# Patient Record
Sex: Male | Born: 1960 | Race: Black or African American | Hispanic: No | Smoking: Former smoker
Health system: Southern US, Community
[De-identification: ages and names within clinical notes are randomized; demographics above are authoritative.]

## PROBLEM LIST (undated history)

## (undated) DIAGNOSIS — C801 Malignant (primary) neoplasm, unspecified: Secondary | ICD-10-CM

## (undated) DIAGNOSIS — I10 Essential (primary) hypertension: Secondary | ICD-10-CM

## (undated) DIAGNOSIS — C20 Malignant neoplasm of rectum: Secondary | ICD-10-CM

## (undated) DIAGNOSIS — G629 Polyneuropathy, unspecified: Secondary | ICD-10-CM

## (undated) DIAGNOSIS — M1990 Unspecified osteoarthritis, unspecified site: Secondary | ICD-10-CM

## (undated) DIAGNOSIS — F32A Depression, unspecified: Secondary | ICD-10-CM

## (undated) DIAGNOSIS — K219 Gastro-esophageal reflux disease without esophagitis: Secondary | ICD-10-CM

## (undated) DIAGNOSIS — G4730 Sleep apnea, unspecified: Secondary | ICD-10-CM

## (undated) DIAGNOSIS — E7800 Pure hypercholesterolemia, unspecified: Secondary | ICD-10-CM

## (undated) DIAGNOSIS — F329 Major depressive disorder, single episode, unspecified: Secondary | ICD-10-CM

## (undated) HISTORY — DX: Malignant neoplasm of rectum: C20

## (undated) HISTORY — PX: CHOLECYSTECTOMY: SHX55

## (undated) HISTORY — PX: BACK SURGERY: SHX140

## (undated) HISTORY — DX: Malignant (primary) neoplasm, unspecified: C80.1

---

## 2005-02-06 ENCOUNTER — Ambulatory Visit (HOSPITAL_COMMUNITY): Admission: RE | Admit: 2005-02-06 | Discharge: 2005-02-06 | Payer: Self-pay | Admitting: Family Medicine

## 2005-02-14 ENCOUNTER — Ambulatory Visit: Payer: Self-pay | Admitting: Orthopedic Surgery

## 2005-03-07 ENCOUNTER — Ambulatory Visit: Payer: Self-pay | Admitting: Orthopedic Surgery

## 2005-03-12 ENCOUNTER — Ambulatory Visit (HOSPITAL_COMMUNITY): Admission: RE | Admit: 2005-03-12 | Discharge: 2005-03-12 | Payer: Self-pay | Admitting: Orthopedic Surgery

## 2005-03-19 ENCOUNTER — Ambulatory Visit: Payer: Self-pay | Admitting: Orthopedic Surgery

## 2005-04-03 ENCOUNTER — Encounter: Admission: RE | Admit: 2005-04-03 | Discharge: 2005-04-03 | Payer: Self-pay | Admitting: Orthopedic Surgery

## 2005-04-18 ENCOUNTER — Encounter: Admission: RE | Admit: 2005-04-18 | Discharge: 2005-04-18 | Payer: Self-pay | Admitting: Internal Medicine

## 2005-05-02 ENCOUNTER — Encounter: Admission: RE | Admit: 2005-05-02 | Discharge: 2005-05-02 | Payer: Self-pay | Admitting: Orthopedic Surgery

## 2005-05-10 ENCOUNTER — Ambulatory Visit: Payer: Self-pay | Admitting: Orthopedic Surgery

## 2005-08-17 ENCOUNTER — Ambulatory Visit (HOSPITAL_COMMUNITY): Admission: RE | Admit: 2005-08-17 | Discharge: 2005-08-17 | Payer: Self-pay | Admitting: Neurosurgery

## 2006-06-28 ENCOUNTER — Inpatient Hospital Stay (HOSPITAL_COMMUNITY): Admission: EM | Admit: 2006-06-28 | Discharge: 2006-07-01 | Payer: Self-pay | Admitting: Emergency Medicine

## 2006-06-29 ENCOUNTER — Ambulatory Visit: Payer: Self-pay | Admitting: Internal Medicine

## 2006-07-31 ENCOUNTER — Ambulatory Visit: Payer: Self-pay | Admitting: Gastroenterology

## 2007-01-21 ENCOUNTER — Encounter: Admission: RE | Admit: 2007-01-21 | Discharge: 2007-04-21 | Payer: Self-pay | Admitting: Neurosurgery

## 2007-02-13 ENCOUNTER — Ambulatory Visit (HOSPITAL_COMMUNITY): Admission: RE | Admit: 2007-02-13 | Discharge: 2007-02-13 | Payer: Self-pay | Admitting: Neurosurgery

## 2007-02-27 ENCOUNTER — Ambulatory Visit (HOSPITAL_COMMUNITY): Admission: RE | Admit: 2007-02-27 | Discharge: 2007-02-27 | Payer: Self-pay | Admitting: Family Medicine

## 2007-03-05 ENCOUNTER — Encounter (INDEPENDENT_AMBULATORY_CARE_PROVIDER_SITE_OTHER): Payer: Self-pay | Admitting: *Deleted

## 2007-03-05 ENCOUNTER — Ambulatory Visit (HOSPITAL_COMMUNITY): Admission: RE | Admit: 2007-03-05 | Discharge: 2007-03-05 | Payer: Self-pay | Admitting: General Surgery

## 2007-04-11 ENCOUNTER — Ambulatory Visit (HOSPITAL_COMMUNITY): Admission: RE | Admit: 2007-04-11 | Discharge: 2007-04-11 | Payer: Self-pay | Admitting: Neurosurgery

## 2007-05-01 ENCOUNTER — Ambulatory Visit: Payer: Self-pay | Admitting: Vascular Surgery

## 2007-05-01 ENCOUNTER — Ambulatory Visit (HOSPITAL_COMMUNITY): Admission: RE | Admit: 2007-05-01 | Discharge: 2007-05-01 | Payer: Self-pay | Admitting: Neurosurgery

## 2007-06-25 ENCOUNTER — Ambulatory Visit (HOSPITAL_COMMUNITY): Admission: RE | Admit: 2007-06-25 | Discharge: 2007-06-25 | Payer: Self-pay | Admitting: Neurosurgery

## 2007-10-07 ENCOUNTER — Encounter: Payer: Self-pay | Admitting: Orthopedic Surgery

## 2007-10-17 ENCOUNTER — Encounter: Admission: RE | Admit: 2007-10-17 | Discharge: 2007-10-17 | Payer: Self-pay | Admitting: Neurosurgery

## 2007-11-03 ENCOUNTER — Encounter: Payer: Self-pay | Admitting: Orthopedic Surgery

## 2008-01-01 ENCOUNTER — Encounter (HOSPITAL_COMMUNITY): Admission: RE | Admit: 2008-01-01 | Discharge: 2008-01-31 | Payer: Self-pay | Admitting: Family Medicine

## 2008-01-13 ENCOUNTER — Encounter: Payer: Self-pay | Admitting: Orthopedic Surgery

## 2008-01-23 ENCOUNTER — Emergency Department (HOSPITAL_COMMUNITY): Admission: EM | Admit: 2008-01-23 | Discharge: 2008-01-23 | Payer: Self-pay | Admitting: Emergency Medicine

## 2008-02-09 ENCOUNTER — Encounter: Payer: Self-pay | Admitting: Orthopedic Surgery

## 2008-03-01 ENCOUNTER — Ambulatory Visit (HOSPITAL_COMMUNITY): Admission: RE | Admit: 2008-03-01 | Discharge: 2008-03-01 | Payer: Self-pay | Admitting: Neurosurgery

## 2008-03-16 ENCOUNTER — Encounter: Payer: Self-pay | Admitting: Orthopedic Surgery

## 2008-05-19 ENCOUNTER — Encounter: Payer: Self-pay | Admitting: Orthopedic Surgery

## 2008-06-28 ENCOUNTER — Encounter: Payer: Self-pay | Admitting: Orthopedic Surgery

## 2008-09-28 ENCOUNTER — Encounter: Payer: Self-pay | Admitting: Orthopedic Surgery

## 2008-11-03 ENCOUNTER — Encounter: Admission: RE | Admit: 2008-11-03 | Discharge: 2008-11-03 | Payer: Self-pay | Admitting: Neurosurgery

## 2008-12-21 ENCOUNTER — Ambulatory Visit (HOSPITAL_COMMUNITY): Admission: RE | Admit: 2008-12-21 | Discharge: 2008-12-21 | Payer: Self-pay | Admitting: Neurology

## 2009-01-20 ENCOUNTER — Encounter: Admission: RE | Admit: 2009-01-20 | Discharge: 2009-01-27 | Payer: Self-pay | Admitting: Neurosurgery

## 2009-02-01 ENCOUNTER — Encounter: Payer: Self-pay | Admitting: Orthopedic Surgery

## 2009-02-07 ENCOUNTER — Ambulatory Visit: Admission: RE | Admit: 2009-02-07 | Discharge: 2009-02-07 | Payer: Self-pay | Admitting: Neurology

## 2009-09-09 ENCOUNTER — Encounter: Payer: Self-pay | Admitting: Orthopedic Surgery

## 2010-12-10 ENCOUNTER — Encounter: Payer: Self-pay | Admitting: Neurosurgery

## 2010-12-10 ENCOUNTER — Encounter: Payer: Self-pay | Admitting: Internal Medicine

## 2010-12-10 ENCOUNTER — Encounter: Payer: Self-pay | Admitting: Family Medicine

## 2011-04-03 NOTE — Op Note (Signed)
NAMEBRODEE, [name] NO.:  [id number]   MEDICAL RECORD NO.:  [id number]          PATIENT TYPE:  INP   LOCATION:  5157                         FACILITY:  [facility]   PHYSICIAN:  Coletta Memos, M.D.     DATE OF BIRTH:  July 19, 1961   DATE OF PROCEDURE:  04/11/2007  DATE OF DISCHARGE:                               OPERATIVE REPORT   PREOPERATIVE DIAGNOSIS:  1. Far lateral displaced disc left L4-L5.  2. Left L4 radiculopathy.   POSTOPERATIVE DIAGNOSIS:  1. Far lateral displaced disc left L4-L5.  2. Left L4 radiculopathy.   PROCEDURE:  Far lateral decompression and discectomy L4-L5 with  microscopic dissection.   SURGEON:  Coletta Memos, M.D.   ASSISTANT:  Danae Orleans. Venetia Maxon, M.D.   INDICATIONS:  [name] has had a far lateral disc herniation at L4-L5  on the left side for at least two years.  He, at this time, requested  that he undergo surgery since the pain was too severe and conservative  measures have not helped him recently.   DESCRIPTION OF PROCEDURE:  Mr. Lacroix was taken to the operating room,  intubated, and placed under a general anesthetic.  His back was prepped  and he was draped in a sterile fashion.  He was positioned on a Wilson  frame and all pressure points were properly padded.  I infiltrated 20 mL  0.5% lidocaine with 1:200,000 epinephrine into the lumbar region.  I  opened the skin with a #10 blade.  I took this down to the thoracolumbar  fascia.  I then exposed the lamina of L4 and L3.  I placed a double  ended ganglion knife inferior to the lamina of what I believed to be L4.  X-ray showed that was the L4 lamina.  I then proceeded to identify the  pars intra-articularis.  I used a high speed drill to remove the lateral  portion of the pars.  I got underneath that with a Kerrison punch and  removed soft tissue with Dr. Fredrich Birks assistance and we were able to  expose what should be the L-4 root.  There was a very hard mass inferior  to the nerve  root which I was able to dissect free from the root itself.  At that time, I then used a 15 blade and opened this mass and it was the  disc herniation.  With Dr. Fredrich Birks assistance and microscopic  dissection, we removed the mass in piecemeal fashion.  This achieved a  very good decompression of the L4 root.  I then irrigated the wound.  I  then placed steroids and fentanyl around the wound.  I then closed the  wound in a layered fashion using Vicryl sutures.  Dermabond was used for  a sterile dressing.  The patient tolerated procedure well.           ______________________________  Coletta Memos, M.D.     KC/MEDQ  D:  04/11/2007  T:  04/11/2007  Job:  [id number]

## 2011-04-06 NOTE — Consult Note (Signed)
NAMEEDU, ON                 ACCOUNT NO.:  [id number]   MEDICAL RECORD NO.:  [id number]          PATIENT TYPE:  AMB   LOCATION:                                FACILITY:  [facility]   PHYSICIAN:  R. Roetta Sessions, M.D. DATE OF BIRTH:  [birth year]   DATE OF CONSULTATION:  DATE OF DISCHARGE:                                   CONSULTATION   REASON FOR CONSULTATION:  Colonoscopy, followup hospitalization.   HISTORY OF PRESENT ILLNESS:  [name] is a 50 year old African American  male who was admitted with acute colitis approximately 1 month ago at [facility].  He was treated with antibiotic therapy which included Cipro  and Flagyl.  He has completed this course.  He told me he is doing much  better.  He denies any problems with diarrhea, rectal bleeding, abdominal  pain, fever, nausea, vomiting.  His bowel movements are normal, soft, and  brown once or twice a day.  He denies any diarrhea or constipation.  He is  taking ibuprofen 400 mg daily.   While hospitalized, his CT scan was abnormal.  It showed diffuse colitis  from the cecum through the sigmoid and an abnormal appearance of the  gallbladder which showed distended fundus, echogenic sludge, possibly  layered stones, and some wall thickening.  It was suggested that since he  had acute colitis at the time the abdominal ultrasound be repeated and I  have set this up today.   PAST MEDICAL HISTORY:  1. Acute colitis as described in the HPI, August 2007, that responded to      antibiotic therapy.  2. Hypertension.  3. Chronic back pain.  4. Chronic GERD.  5. Proteinuria, followed by Dr. Kristian Covey.   CURRENT MEDICATIONS:  1. Over-the-counter congestion pill for URI.  2. Prilosec 20 mg daily.  3. Ibuprofen 400 mg daily or b.i.d. p.r.n.  4. Lotrel 5/500 mg daily.  5. Amitriptyline 275 mg q.h.s.   ALLERGIES:  NO KNOWN DRUG ALLERGIES.   FAMILY HISTORY:  Mother deceased secondary to metastatic breast carcinoma.  It is  possible that brother had a history of colon cancer, diagnosed in his  24s.  Otherwise history is positive for CVA and MI.   SOCIAL HISTORY:  Mr. Trier is married.  He has 3 children.  He is on  disability.  He has a remote history of tobacco use.  Denies any alcohol or  drug use.   REVIEW OF SYSTEMS:  CONSTITUTIONAL:  Weight is stable.  Denies any fatigue  or rash.  Denies any chest pain or palpitations.  Denies shortness of  breath, dyspnea, cough, hemoptysis.  GI:  See HPI.   PHYSICAL EXAMINATION:  VITAL SIGNS:  Weight 201 pounds, height 63 inches,  temp 97.7, blood pressure 128/88, and pulse 88.  GENERAL:  [name] is a 50 year old, obese, African American male who is  alert, oriented, pleasant, cooperative in no acute distress.  HEENT:  __________  .  Conjunctivae pink.  Oropharynx pink and moist without  any lesions.  NECK:  Supple without any mass  or thyromegaly.  CHEST:  Heart regular rate and rhythm with normal S1 S2 without any murmurs,  clicks, rubs or gallops.  LUNGS:  Clear to auscultation bilaterally.  ABDOMEN:  Protuberant with positive bowel sounds x4.  No bruits auscultated.  Abdomen is soft, nontender, nondistended.  He does have an easily reducible  small umbilical hernia.  No rebound tenderness or guarding.  No  hepatosplenomegaly or mass.  Exam is limited given the patient's body  habitus.  EXTREMITIES:  Without clubbing or edema bilaterally.   IMPRESSION:  1. [name] is a 50 year old African American male with acute colitis      which has responded to antibiotic therapy, possibly confirming that      this was infectious in etiology.  However, he has never had colonoscopy      and given disease findings on CT, we need to rule out inflammatory      bowel disease.  2. He was also incidentally found to have a distended gallbladder,      possibly some calculi and sludge and a distended fundus.  We would like      to reassess the gallbladder now that his acute  colitis has resolved.   PLAN:  1. We will schedule abdominal ultrasound.  2. Colonoscopy with Dr. Jena Gauss in the near future.  I have discussed the      procedure including risks and benefits which include, but are not      limited to, bleeding, infection, perforation, drug reaction, increased      __________  .  3. Further recommendations to follow.      Nicholas Lose, N.P.      Jonathon Bellows, M.D.  Electronically Signed    KC/MEDQ  D:  07/31/2006  T:  07/31/2006  Job:  [id number]   cc:   Jeoffrey Massed, MD  Fax: [phone]

## 2011-04-06 NOTE — Group Therapy Note (Signed)
NAMEBARAN, [name]                 ACCOUNT NO.:  [id number]   MEDICAL RECORD NO.:  [id number]          PATIENT TYPE:  INP   LOCATION:  A321                          FACILITY:  [facility]   PHYSICIAN:  Margaretmary Dys, M.D.DATE OF BIRTH:  08/29/1961   DATE OF PROCEDURE:  06/30/2006  DATE OF DISCHARGE:                                   PROGRESS NOTE   SUBJECTIVE:  Patient doing fairly well.  He is able to tolerate clear  liquids and his diet has been advanced by GI.  He denies any diarrhea.  He  has no nausea and vomiting.  His abdominal cramping is also significantly  improved.   OBJECTIVE:  Conscious, alert, comfortable, not in acute distress, well  oriented to time, place and person.  VITAL SIGNS:  Blood pressure 122/86, pulse of 97, respirations 20, T-max was  97.7.  Oxygen saturation 95% on room air.  HEENT EXAM:  Normocephalic and atraumatic.  Oral mucosa was moist with no  exudate.  NECK:  Supple, no JVD, no lymphadenopathy.  LUNGS:  Clear clinically with good air entry bilaterally.  HEART:  S1-S2 regular, no S3, S4, gallops or murmurs.  ABDOMEN:  Soft and nontender.  Bowel sounds positive.  EXTREMITIES:  No edema.   LABORATORY/DIAGNOSTIC DATA:  White blood cell count was down to 11.2,  hemoglobin of 13.3, hematocrit 39.2, platelet count was 162 with no left  shift.  Sodium 136, potassium 3.7, chloride of 103, CO2 of 29, glucose 102,  BUN of 8, creatinine 1.1.  AST 23, ALT 43, total protein 5.9, albumin 3.1,  calcium of 8.7.   Blood cultures remain negative.  C. diff toxin is negative.  Stool cultures  are negative, and stool for WBC was also negative.   ASSESSMENT AND PLAN:  [name] is a 50 year old, African-American  male admitted with extensive colitis.  The patient is doing better today.  He is able to tolerate p.o. now.  I will switch antibiotics to Cipro p.o.  and Flagyl p.o.  I will advance the diet as recommended by GI.  I think  patient may be discharged  home in the morning on oral antibiotics, if he  continues to do well.  He will continue all his other home medications at  this time.      Margaretmary Dys, M.D.  Electronically Signed     AM/MEDQ  D:  06/30/2006  T:  06/30/2006  Job:  [id number]

## 2011-04-06 NOTE — Discharge Summary (Signed)
NAMESUMEDH, SHINSATO NO.:  [id number]   MEDICAL RECORD NO.:  [id number]          PATIENT TYPE:  INP   LOCATION:  A321                          FACILITY:  [facility]   PHYSICIAN:  Osvaldo Shipper, MD     DATE OF BIRTH:  1961-06-22   DATE OF ADMISSION:  06/28/2006  DATE OF DISCHARGE:  08/13/2007LH                                 DISCHARGE SUMMARY   Please review H&P dictated at time of admission for details regarding  patient's presenting illness.   The patient's primary care physician is Dr. Nicoletta Ba.   DISCHARGE DIAGNOSES:  1. Extensive colitis involving cecum to sigmoid, improved.  2. Nonspecific gallbladder findings requiring followup.  3. History of hypertension.  4. History of chronic back pain.  5. History of chronic acid reflux disease.   BRIEF HOSPITAL COURSE:  Briefly, this is a 50 year old African-American male  who has the above-mentioned medical problems who presented with a 3-day  history of abdominal pain mostly in the lower part of his abdomen,  subsequently involving his entire abdomen.  The patient presented to the ED  where he underwent a CAT scan of his abdomen and pelvis which showed  extensive colitis involving most of his colon from cecum all the way down to  the sigmoid colon.  The patient was hence admitted to the hospital for  further treatment and evaluation.  C. difficile came back negative, stool  cultures are pending at this time, however, as of now, no colonies are  growing.  There were no wbc's.  Etiology for his colitis is most likely  thought to be infectious although inflammatory bowel disease is not ruled  out. His white count was also elevated when he presented.  He was put on  Cipro and Flagyl with which he showed improvement slowly.  His diet was  slowly reinstituted and he seemed to tolerate that well as well. The patient  improved progressively during this admission.  On the day of discharge he  was feeling quite well  enough to go home.   Abnormal gallbladder.  As part of the  CAT scan report it was mentioned that  there was an abnormal appearance of the gallbladder with distended fundus of  high attenuation than lower gallbladder segment.  These findings were  thought to be quite unusual hence ultrasound of the abdomen was recommended  once his colitis had subsided.  However, patient underwent ultrasound on  Sunday which showed part of the gallbladder being distended and full of  echogenic sludge and layering stones with  slight wall thickening.  This was  thought to be an unusual case with more proximal part of the gallbladder  appearing normal.  I discussed this issue with Dr. Jena Gauss who mentioned that  considering his extensive colitis, these findings need to be interpreted  very carefully.  He recommended that since the patient was feeling quite  well that he may be able to go home and have an ultrasound in about three  weeks to look at the gallbladder again.   Today's LFTs were about 61 and  75 respectively, bilirubin and alkaline  phosphatase were normal.  He was having very minimal right upper quadrant  symptoms.  He was not having any nausea or vomiting.  Since he was very keen  on going home and based on my discussion with Dr. Jena Gauss,  he felt it was  okay for [place]  to let him go at this time. His other medical issues remained  stable.   DISCHARGE MEDICATIONS:  1. Cipro 500 mg b.i.d. for 10 days.  2. Flagyl 500 mg t.i.d. for 10 days.  He was otherwise asked to continue      his other outpatient medications as before.  Please review H&P for full      list.   OTHER INSTRUCTIONS:  Patient told that if he re-experiences abdominal pain,  nausea or vomiting, he needed to return to the ED immediately.   FOLLOW UP:  1. With Dr. Jena Gauss for colonoscopy.  Appointment has been made for this      patient.  2. Repeat ultrasound of the gallbladder in three weeks time.   DIET:  Low sodium diet.    PHYSICAL ACTIVITIES:  No restrictions.   IMAGING:  CAT scan of the abdomen and pelvis and abdominal ultrasound as  discussed above.   CONSULTATIONS:  GI Dr. Jena Gauss.      Osvaldo Shipper, MD  Electronically Signed     GK/MEDQ  D:  07/01/2006  T:  07/01/2006  Job:  [id number]   cc:   R. Roetta Sessions, M.D.  [id number]  [place]  [place] [address]   Jeoffrey Massed, MD  Fax: [phone]

## 2011-04-06 NOTE — H&P (Signed)
NAME:  [name], [name] NO.:  [id number]   MEDICAL RECORD NO.:  [id number]          PATIENT TYPE:  AMB   LOCATION:  DAY                           FACILITY:  [facility]   PHYSICIAN:  Dalia Heading, M.D.  DATE OF BIRTH:  1961-03-02   DATE OF ADMISSION:  DATE OF DISCHARGE:  LH                              HISTORY & PHYSICAL   CHIEF COMPLAINT:  Cholecystitis, cholelithiasis.   HISTORY OF PRESENT ILLNESS:  Patient is a 50 year old black male who is  referred for evaluation and treatment of biliary colic secondary to  cholelithiasis.  He has been having right upper quadrant abdominal pain,  nausea, bloating for many weeks.  No fatty food intolerances noted.  No  fever, chills or jaundice have been noted.   CURRENT MEDICATIONS:  Hydrocodone for back pain.  Amitriptyline,  ibuprofen, amlodipine, Prilosec.   ALLERGIES:  NO KNOWN DRUG ALLERGIES.   PAST MEDICAL HISTORY:  1. Depression.  2. Hypertension.  3. Reflux disease.   PAST SURGICAL HISTORY:  Unremarkable.   REVIEW OF SYSTEMS:  Patient denies drinking or smoking.  He denies any  cardiopulmonary difficulties or bleeding disorders.   PHYSICAL EXAMINATION:  GENERAL APPEARANCE:  Patient is an obese black  male in no acute distress.  HEENT:  No scleral icterus.  LUNGS:  Clear to auscultation with equal breath sounds bilaterally.  CARDIOVASCULAR:  Regular rate and rhythm without S3, S4 or murmurs.  ABDOMEN:  Soft and nondistended.  He is tender in the right upper  quadrant to palpation.  No hepatosplenomegaly, masses or hernias are  identified.   Ultrasound of the gallbladder reveals sludge and cholelithiasis with a  slightly upper limit of normal size common bile duct.  Liver enzyme  tests are noted to be elevated but the total bilirubin is within normal  limits.   IMPRESSION:  Cholecystitis, cholelithiasis.   PLAN:  The patient is scheduled for laparoscopic cholecystectomy with  cholangiograms on March 05, 2007.  The risks and benefits of the  procedure including bleeding, infection, hepatobiliary injury and a  possibility of an open procedure were fully explained to the patient,  gave informed consent.      Dalia Heading, M.D.  Electronically Signed     MAJ/MEDQ  D:  03/04/2007  T:  03/04/2007  Job:  [id number]   cc:   Short Stay at [facility]   Jeoffrey Massed, MD  Fax: [phone]

## 2011-04-06 NOTE — Procedures (Signed)
[name], [name]                 ACCOUNT NO.:  [id number]   MEDICAL RECORD NO.:  [id number]          PATIENT TYPE:  OUT   LOCATION:  SLEE                          FACILITY:  [facility]   PHYSICIAN:  Kofi A. Gerilyn Pilgrim, M.D. DATE OF BIRTH:  [birth year]   DATE OF PROCEDURE:  02/21/2009  DATE OF DISCHARGE:  02/07/2009                             SLEEP DISORDER REPORT   REFERRING PHYSICIAN:  Kofi A. Doonquah, MD   INDICATIONS:  This is a 50 year old man who presents with snoring,  hypersomnia, and is being evaluated for obstructive sleep apnea  syndrome.   MEDICATIONS:  Glipizide, trazodone, Flexeril, Lotrel, hydrocodone,  Prilosec, nortriptyline, Mucinex.   EPWORTH SLEEPINESS SCALE:  1. BMI 37.   ARCHITECTURAL SUMMARY:  The total recording time is 422 minutes.  Sleep  efficiency 71%.  Sleep latency 122 minutes, REM latency 129 minutes.  Stage N1-2%, N2 45%, N3 39%, and REM sleep 14%.   RESPIRATORY SUMMARY:  The baseline oxygen saturation is 95%.  Lowest  saturation 72%.  AHI is 14 with more events occurring during REM sleep.  The REM AHI is 67.   LIMB MOVEMENTS:  PLM index is 6.  However, the patient did have  increased basic EMG activity/fragmentary myoclonus seen both through REM  sleep and non-REM sleep.   ELECTROCARDIOGRAM SUMMARY:  The average heart rate is 96 with no  significant dysrhythmias observed.   IMPRESSION:  1. 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  2. 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  3. 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      Kofi A. Gerilyn Pilgrim, M.D.  Electronically Signed     KAD/MEDQ  D:  02/21/2009  T:  02/21/2009  Job:  [id number]

## 2011-08-14 LAB — CREATININE, SERUM
Creatinine, Ser: 0.9
GFR calc non Af Amer: >60

## 2011-09-03 LAB — POCT I-STAT CREATININE
Creatinine, Ser: 1
Operator id: 213721

## 2011-09-07 ENCOUNTER — Other Ambulatory Visit: Payer: Self-pay | Admitting: Neurology

## 2011-09-07 DIAGNOSIS — R531 Weakness: Secondary | ICD-10-CM

## 2011-09-07 DIAGNOSIS — G959 Disease of spinal cord, unspecified: Secondary | ICD-10-CM

## 2011-09-12 ENCOUNTER — Ambulatory Visit (HOSPITAL_COMMUNITY)
Admission: RE | Admit: 2011-09-12 | Discharge: 2011-09-12 | Disposition: A | Discharge status: Home / Self Care | Payer: Medicare HMO | Source: Ambulatory Visit | Attending: Neurology | Admitting: Neurology

## 2011-09-12 ENCOUNTER — Other Ambulatory Visit: Payer: Self-pay | Admitting: Neurology

## 2011-09-12 DIAGNOSIS — M47814 Spondylosis without myelopathy or radiculopathy, thoracic region: Secondary | ICD-10-CM | POA: Insufficient documentation

## 2011-09-12 DIAGNOSIS — M5020 Other cervical disc displacement, unspecified cervical region: Secondary | ICD-10-CM | POA: Insufficient documentation

## 2011-09-12 DIAGNOSIS — M79609 Pain in unspecified limb: Secondary | ICD-10-CM | POA: Insufficient documentation

## 2011-09-12 DIAGNOSIS — G959 Disease of spinal cord, unspecified: Secondary | ICD-10-CM

## 2011-09-12 DIAGNOSIS — R52 Pain, unspecified: Secondary | ICD-10-CM

## 2011-09-12 DIAGNOSIS — M5380 Other specified dorsopathies, site unspecified: Secondary | ICD-10-CM | POA: Insufficient documentation

## 2011-09-12 DIAGNOSIS — M542 Cervicalgia: Secondary | ICD-10-CM | POA: Insufficient documentation

## 2011-09-12 DIAGNOSIS — M546 Pain in thoracic spine: Secondary | ICD-10-CM | POA: Insufficient documentation

## 2011-09-12 DIAGNOSIS — R531 Weakness: Secondary | ICD-10-CM

## 2011-09-26 ENCOUNTER — Emergency Department (HOSPITAL_COMMUNITY): Payer: Medicare HMO

## 2011-09-26 ENCOUNTER — Encounter: Payer: Self-pay | Admitting: Emergency Medicine

## 2011-09-26 ENCOUNTER — Emergency Department (HOSPITAL_COMMUNITY)
Admission: EM | Admit: 2011-09-26 | Discharge: 2011-09-26 | Disposition: A | Discharge status: Home / Self Care | Payer: Medicare HMO | Attending: Emergency Medicine | Admitting: Emergency Medicine

## 2011-09-26 ENCOUNTER — Other Ambulatory Visit: Payer: Self-pay | Admitting: Neurology

## 2011-09-26 DIAGNOSIS — M79609 Pain in unspecified limb: Secondary | ICD-10-CM | POA: Insufficient documentation

## 2011-09-26 DIAGNOSIS — R29898 Other symptoms and signs involving the musculoskeletal system: Secondary | ICD-10-CM

## 2011-09-26 DIAGNOSIS — Z79899 Other long term (current) drug therapy: Secondary | ICD-10-CM | POA: Insufficient documentation

## 2011-09-26 DIAGNOSIS — M549 Dorsalgia, unspecified: Secondary | ICD-10-CM | POA: Insufficient documentation

## 2011-09-26 DIAGNOSIS — I10 Essential (primary) hypertension: Secondary | ICD-10-CM | POA: Insufficient documentation

## 2011-09-26 DIAGNOSIS — E119 Type 2 diabetes mellitus without complications: Secondary | ICD-10-CM | POA: Insufficient documentation

## 2011-09-26 HISTORY — DX: Pure hypercholesterolemia, unspecified: E78.00

## 2011-09-26 HISTORY — DX: Essential (primary) hypertension: I10

## 2011-09-26 LAB — POCT I-STAT, CHEM 8
BUN: 5 mg/dL — ABNORMAL LOW (ref 6–23)
Calcium, Ion: 1.19 mmol/L (ref 1.12–1.32)
Chloride: 102 mEq/L (ref 96–112)
Potassium: 4.1 mEq/L (ref 3.5–5.1)

## 2011-09-26 MED ORDER — GADOBENATE DIMEGLUMINE 529 MG/ML IV SOLN
20.0000 mL | Freq: Once | INTRAVENOUS | Status: AC | PRN
  Administered 2011-09-26: 20 mL via INTRAVENOUS

## 2011-09-26 NOTE — ED Notes (Signed)
Pt was seen in Dr.Doonquah's office today and told to come to ed for evaluation of bilateral leg weakness.

## 2011-09-26 NOTE — ED Notes (Signed)
Returns from MRI

## 2011-09-26 NOTE — ED Provider Notes (Signed)
Scribed for Joya Gaskins, MD, the patient was seen in room APA17/APA17. This chart was scribed by [employer]. The patient's care started at 19:26  CSN: [id number] Arrival date & time: 09/26/2011  7:14 PM   First MD Initiated Contact with Patient 09/26/11 1926      Chief Complaint  Patient presents with  . Leg Pain   Patient is a 50 y.o. male presenting with back pain. The history is provided by the patient.  Back Pain  This is a recurrent problem. Episode onset: a brief time ago. The problem occurs constantly. The problem has been gradually worsening. The pain is present in the thoracic spine and lumbar spine. The quality of the pain is described as shooting. The pain radiates to the right thigh and left thigh. The pain is moderate. The symptoms are aggravated by certain positions. Pertinent negatives include no bowel incontinence and no bladder incontinence.   [name] is a 50 y.o. male who presents to the Emergency Department complaining of Leg Pain. Patient reports that he saw Dr Gerilyn Pilgrim today and was asked to come to the ED for evaluation of bilateral leg weakness. He reports a history of lower back surgery. He states that since his back surgery, his left leg has been weak and thinks that his right leg is getting weaker. Denies bowel or bladder loss, fever, falls, abdominal pain or numbness in his legs. Patient ambulates with a cane No falls reported  Past Medical History  Diagnosis Date  . Diabetes mellitus   . Hypertension   . High cholesterol     Past Surgical History  Procedure Date  . Back surgery   . Cholecystectomy     History reviewed. No pertinent family history.  History  Substance Use Topics  . Smoking status: Current Everyday Smoker  . Smokeless tobacco: Not on file  . Alcohol Use: No      Review of Systems  Gastrointestinal: Negative for bowel incontinence.  Genitourinary: Negative for bladder incontinence.  Musculoskeletal: Positive for  back pain.  All other systems reviewed and are negative.    Allergies  Review of patient's allergies indicates no known allergies.  Home Medications   Current Outpatient Rx  Name Route Sig Dispense Refill  . AMLODIPINE BESYLATE 5 MG PO TABS Oral Take 5 mg by mouth daily.      Marland Kitchen BENAZEPRIL HCL 40 MG PO TABS Oral Take 40 mg by mouth daily.      Marland Kitchen GABAPENTIN 800 MG PO TABS Oral Take 800 mg by mouth 2 (two) times daily.      Marland Kitchen HYDROCODONE-ACETAMINOPHEN 7.5-500 MG PO TABS Oral Take 1 tablet by mouth 2 (two) times daily as needed. For pain     . METFORMIN HCL 1000 MG PO TABS Oral Take 1,000 mg by mouth 2 (two) times daily with a meal.      . NORTRIPTYLINE HCL 75 MG PO CAPS Oral Take 75 mg by mouth at bedtime.      . OMEPRAZOLE 20 MG PO CPDR Oral Take 20 mg by mouth daily.      Marland Kitchen TIZANIDINE HCL 4 MG PO TABS Oral Take 4 mg by mouth 2 (two) times daily.      . TRAMADOL HCL 50 MG PO TABS Oral Take 50 mg by mouth 2 (two) times daily. Maximum dose= 8 tablets per day     . GUAIFENESIN 400 MG PO TABS Oral Take 400 mg by mouth daily as needed. For congestion  BP 143/89  Pulse 92  Temp(Src) 97.8 F (36.6 C) (Oral)  Resp 20  Ht 5\' 3"  (1.6 m)  Wt 220 lb (99.791 kg)  BMI 38.97 kg/m2  SpO2 100%  Physical Exam  CONSTITUTIONAL: Well developed/well nourished HEAD AND FACE: Normocephalic/atraumatic EYES: EOMI/PERRL ENMT: Mucous membranes moist NECK: supple no meningeal signs SPINE:lumbar spine tender, thoracic spine tender CV: S1/S2 noted, no murmurs/rubs/gallops noted LUNGS: Lungs are clear to auscultation bilaterally, no apparent distress ABDOMEN: soft, nontender, no rebound or guarding NEURO: Pt is awake/alert, moves all extremitiesx4, pt is ambulatory  hip flexion/knee flexion/extension intact, ankle dorsi/plantar flexion, great toe extension intact bilaterally, no clonus bilaterally. Pt is able to ambulate with cane EXTREMITIES: full ROM SKIN: warm, color normal PSYCH: no  abnormalities of mood noted   ED Course  Procedures   DIAGNOSTIC STUDIES: Oxygen Saturation is 100% on room air, normal by my interpretation.    COORDINATION OF CARE: 19:40 - EDP examined patient at bedside and ordered the following Spoke to his neurologist, dr Gerilyn Pilgrim, he reports that his neurologic exam is changed and needs emergent MRI.  Pt ambulatory at this time   10:26 PM D/w dr Newell Coral who reviewed mri results He can f/u as outpatient, no acute intervention   MDM  Nursing notes reviewed and considered in documentation All labs/vitals reviewed and considered Previous records reviewed and considered MRI reviewed, discussed results with radiologist dr Bonnielee Haff   I personally performed the services described in this documentation, which was scribed in my presence. The recorded information has been reviewed and considered.          Joya Gaskins, MD 09/27/11 [phone]

## 2011-10-05 ENCOUNTER — Ambulatory Visit (HOSPITAL_COMMUNITY): Payer: Medicare HMO

## 2014-07-27 ENCOUNTER — Telehealth: Payer: Self-pay | Admitting: Gastroenterology

## 2014-07-27 NOTE — Telephone Encounter (Signed)
[facility] FOR COLONOSCOPY  PLEASE CALL PATIENT [phone] OR [phone]

## 2014-07-28 NOTE — Telephone Encounter (Signed)
Needs Phenergan 12.5  mg IV on call. Bring CPAP mask to short stay.

## 2014-07-28 NOTE — Telephone Encounter (Signed)
Gastroenterology Pre-Procedure Review  Request Date: 07/27/2014 Requesting Physician: Dr. Legrand Rams  PATIENT REVIEW QUESTIONS: The patient responded to the following health history questions as indicated:   PT HAS A C-PAP  Pt said he had scheduled a colonoscopy at once and backed out  1. Diabetes Melitis: no   Borderline/ diet controlled 2. Joint replacements in the past 12 months: no 3. Major health problems in the past 3 months: no 4. Has an artificial valve or MVP: no 5. Has a defibrillator: no 6. Has been advised in past to take antibiotics in advance of a procedure like teeth cleaning: no    MEDICATIONS & ALLERGIES:    Patient reports the following regarding taking any blood thinners:   Plavix? no Aspirin? no Coumadin? no  Patient confirms/reports the following medications:  Current Outpatient Prescriptions  Medication Sig Dispense Refill  . amLODipine (NORVASC) 5 MG tablet Take 5 mg by mouth daily.        . benazepril (LOTENSIN) 40 MG tablet Take 40 mg by mouth daily.        Marland Kitchen gabapentin (NEURONTIN) 800 MG tablet Take 800 mg by mouth 2 (two) times daily.        Marland Kitchen HYDROcodone-acetaminophen (LORTAB) 7.5-500 MG per tablet Take 1 tablet by mouth 2 (two) times daily as needed. For pain       . nortriptyline (PAMELOR) 75 MG capsule Take 75 mg by mouth at bedtime.        Marland Kitchen tiZANidine (ZANAFLEX) 4 MG tablet Take 4 mg by mouth 2 (two) times daily.        . traMADol (ULTRAM) 50 MG tablet Take 50 mg by mouth 2 (two) times daily. Maximum dose= 8 tablets per day       . guaifenesin (MUCUS RELIEF) 400 MG TABS Take 400 mg by mouth daily as needed. For congestion       . metFORMIN (GLUCOPHAGE) 1000 MG tablet Take 1,000 mg by mouth 2 (two) times daily with a meal.        . omeprazole (PRILOSEC) 20 MG capsule Take 20 mg by mouth daily.         No current facility-administered medications for this visit.    Patient confirms/reports the following allergies:  No Known Allergies  No orders of  the defined types were placed in this encounter.    AUTHORIZATION INFORMATION Primary Insurance:   ID #:   Group #:  Pre-Cert / Auth required:  Pre-Cert / Auth #:   Secondary Insurance:   ID #:   Group #:  Pre-Cert / Auth required: Pre-Cert / Auth #:   SCHEDULE INFORMATION: Procedure has been scheduled as follows:  Date:                Time:   Location:   This Gastroenterology Pre-Precedure Review Form is being routed to the following provider(s): R. Garfield Cornea, MD

## 2014-08-02 ENCOUNTER — Other Ambulatory Visit: Payer: Self-pay

## 2014-08-02 DIAGNOSIS — Z1211 Encounter for screening for malignant neoplasm of colon: Secondary | ICD-10-CM

## 2014-08-02 MED ORDER — PEG-KCL-NACL-NASULF-NA ASC-C 100 G PO SOLR: 1.0000 | ORAL | Status: DC

## 2014-08-02 NOTE — Telephone Encounter (Signed)
Rx sent to the pharmacy. Instructions mailed to pt. Phenergan order added. Note for the CPAP on his instructions.

## 2014-08-02 NOTE — Addendum Note (Signed)
Addended by: Everardo All on: 08/02/2014 04:31 PM   Modules accepted: Orders

## 2014-08-13 ENCOUNTER — Encounter (HOSPITAL_COMMUNITY): Payer: Self-pay | Admitting: Pharmacy Technician

## 2014-08-18 ENCOUNTER — Telehealth: Payer: Self-pay

## 2014-08-18 NOTE — Telephone Encounter (Signed)
I called and LMOM for pt to call. His instructions were returned. Confirm address again.

## 2014-08-23 NOTE — Telephone Encounter (Signed)
Also, called his emergency contact and the number was not working.

## 2014-08-23 NOTE — Telephone Encounter (Signed)
LMOM that I need to get these instructions to him for his procedure on 08/27/2014.  Sending FYI to Ginger since I will be off Thurs and Fri.

## 2014-08-24 ENCOUNTER — Other Ambulatory Visit: Payer: Self-pay

## 2014-08-24 NOTE — Telephone Encounter (Signed)
Pt is calling because he does not have his instruction for his TCS. I have faxed to instruction to [facility] in [place] and called them so they can put them with the Movie-Prep.

## 2014-08-25 ENCOUNTER — Telehealth: Payer: Self-pay

## 2014-08-25 NOTE — Telephone Encounter (Signed)
Patient called inquiring about what to eat prior to colonoscopy

## 2014-08-26 ENCOUNTER — Other Ambulatory Visit: Payer: Self-pay

## 2014-08-26 MED ORDER — PROMETHAZINE HCL 25 MG/ML IJ SOLN
12.5000 mg | Freq: Once | INTRAMUSCULAR | Status: DC
Start: 2014-08-26 — End: 2014-08-26

## 2014-08-26 MED ORDER — PEG 3350-KCL-NA BICARB-NACL 420 G PO SOLR: 4000.0000 mL | ORAL | Status: DC

## 2014-08-26 NOTE — Telephone Encounter (Signed)
Pt called- his procedure is tomorrow and he went to pick up the movi-prep and it was too expensive. Ginger sent in try-lite with instructions. I called both the pts home number and left a message and the [phone] (the number the patient gave me) and advised him that he needed to go to [facility] now and get prep and start it asap.   Pt called yesterday asking questions about the clear liquid diet. I went over everything that he could have and what he couldn't have. I informed him multiple times that he could not have cookies and pudding, only clear liquids. We spent 20 minutes on the phone discussing what he could and couldn't have and he said he understood.

## 2014-08-27 ENCOUNTER — Ambulatory Visit (HOSPITAL_COMMUNITY): Admission: RE | Admit: 2014-08-27 | Payer: Medicare HMO | Source: Ambulatory Visit | Admitting: Internal Medicine

## 2014-08-27 ENCOUNTER — Telehealth: Payer: Self-pay | Admitting: *Deleted

## 2014-08-27 ENCOUNTER — Encounter (HOSPITAL_COMMUNITY): Admission: RE | Payer: Self-pay | Source: Ambulatory Visit

## 2014-08-27 SURGERY — COLONOSCOPY
Anesthesia: Moderate Sedation

## 2014-08-27 NOTE — Telephone Encounter (Signed)
[name] called to find out about his prep for his test

## 2014-08-27 NOTE — Telephone Encounter (Signed)
Pt can't afford the Tri-lye prep. He stated that the drug store did not have the prep Thursday. I told him that I would let DS know and she could get him rescheduled and see what can be done about his prep.

## 2014-09-06 NOTE — Telephone Encounter (Signed)
Called. Many rings and no answer.  

## 2014-09-14 NOTE — Telephone Encounter (Signed)
Letter mailed to pt to call to reschedule his colonoscopy.

## 2015-01-26 DIAGNOSIS — Z79899 Other long term (current) drug therapy: Secondary | ICD-10-CM | POA: Diagnosis not present

## 2015-01-26 DIAGNOSIS — M545 Low back pain: Secondary | ICD-10-CM | POA: Diagnosis not present

## 2015-01-26 DIAGNOSIS — I10 Essential (primary) hypertension: Secondary | ICD-10-CM | POA: Diagnosis not present

## 2015-01-26 DIAGNOSIS — G5600 Carpal tunnel syndrome, unspecified upper limb: Secondary | ICD-10-CM | POA: Diagnosis not present

## 2015-01-31 DIAGNOSIS — I10 Essential (primary) hypertension: Secondary | ICD-10-CM | POA: Diagnosis not present

## 2015-01-31 DIAGNOSIS — M549 Dorsalgia, unspecified: Secondary | ICD-10-CM | POA: Diagnosis not present

## 2015-01-31 DIAGNOSIS — E785 Hyperlipidemia, unspecified: Secondary | ICD-10-CM | POA: Diagnosis not present

## 2015-03-01 ENCOUNTER — Telehealth: Payer: Self-pay

## 2015-03-01 NOTE — Telephone Encounter (Signed)
Pt was referred by Dr. Legrand Rams for a screening colonoscopy. He was scheduled for one in 08/2014 with Dr. Gala Romney and cancelled. I have mailed him a letterto [facility] and also [address] , [place], [place]. Both were returned.  I have tried to call him at [phone] and cannot reach him, VM not set up.  I am faxing a letter to PCP, Dr. Legrand Rams with this info.

## 2015-03-01 NOTE — Telephone Encounter (Signed)
Pt called and actually thought we had called him about a C-pap machine. I did triage and found that he needs an OV appt first due to his med list.  I finally made him realize that we had not called about his C-pap, so he said he thought this was Dr. [name] office.  He is scheduled for OV with Walden Field, NP on 03/24/2015 at 2:00 PM.   He has [employer] and I will have Manuela Schwartz check and see if he needs referral for the OV. ( His number is [phone]).

## 2015-03-03 DIAGNOSIS — G4733 Obstructive sleep apnea (adult) (pediatric): Secondary | ICD-10-CM | POA: Diagnosis not present

## 2015-03-08 NOTE — Telephone Encounter (Signed)
I called Dr Josephine Cables office to get a [facility] referral on patient that's coming on 03/24/15.

## 2015-03-11 DIAGNOSIS — G5610 Other lesions of median nerve, unspecified upper limb: Secondary | ICD-10-CM | POA: Diagnosis not present

## 2015-03-11 DIAGNOSIS — M79603 Pain in arm, unspecified: Secondary | ICD-10-CM | POA: Diagnosis not present

## 2015-03-22 DIAGNOSIS — G5600 Carpal tunnel syndrome, unspecified upper limb: Secondary | ICD-10-CM | POA: Diagnosis not present

## 2015-03-22 DIAGNOSIS — M545 Low back pain: Secondary | ICD-10-CM | POA: Diagnosis not present

## 2015-03-22 DIAGNOSIS — M5416 Radiculopathy, lumbar region: Secondary | ICD-10-CM | POA: Diagnosis not present

## 2015-03-22 DIAGNOSIS — I10 Essential (primary) hypertension: Secondary | ICD-10-CM | POA: Diagnosis not present

## 2015-03-22 DIAGNOSIS — Z79899 Other long term (current) drug therapy: Secondary | ICD-10-CM | POA: Diagnosis not present

## 2015-03-24 ENCOUNTER — Ambulatory Visit (INDEPENDENT_AMBULATORY_CARE_PROVIDER_SITE_OTHER): Payer: Commercial Managed Care - HMO | Admitting: Nurse Practitioner

## 2015-03-24 ENCOUNTER — Encounter: Payer: Self-pay | Admitting: Nurse Practitioner

## 2015-03-24 ENCOUNTER — Other Ambulatory Visit: Payer: Self-pay

## 2015-03-24 VITALS — BP 123/85 | HR 102 | Temp 97.6°F | Ht 63.0 in | Wt 207.4 lb

## 2015-03-24 DIAGNOSIS — Z1211 Encounter for screening for malignant neoplasm of colon: Secondary | ICD-10-CM

## 2015-03-24 DIAGNOSIS — R69 Illness, unspecified: Secondary | ICD-10-CM | POA: Diagnosis not present

## 2015-03-24 MED ORDER — PEG 3350-KCL-NA BICARB-NACL 420 G PO SOLR: 4000.0000 mL | Freq: Once | ORAL | Status: DC

## 2015-03-24 NOTE — Assessment & Plan Note (Signed)
54 year old male presents on referral from PCP for evaluation for initial screening colonoscopy. Patient is essentially a symptomatically GI standpoint. No family history of colon cancer or colon polyps. Patient evaluated in the office do to chronic pain medication use and need for likely heavier sedation. At this point we'll proceed with his initial screening colonoscopy.  Proceed with TCS with Dr. Gala Romney in the OR with propofol in near future: the risks, benefits, and alternatives have been discussed with the patient in detail. The patient states understanding and desires to proceed.  Patient is not on any anticoagulants. Patient is not on any antidiabetic medications. Patient is on chronic Norco pain medication 3 times a day for multiple years. Is also on Ultram. Denies alcohol and drug use. We'll schedule procedure and the OR with propofol for sedation due to chronic pain medication use.

## 2015-03-24 NOTE — Assessment & Plan Note (Signed)
Patient long-term chronic pain medication for back pain. Currently takes Ultram as well as Norco 7.5/325 3 times a day. Denies alcohol or drug use. Due to long-term use of chronic pain medication we will schedule his procedure and the OR with propofol sedation. See "encounter for screening colonoscopy "assessment and plan for further details.

## 2015-03-24 NOTE — Progress Notes (Signed)
Primary Care Physician:  Rosita Fire, MD Primary Gastroenterologist:  Dr. Gala Romney  Chief Complaint  Patient presents with  . Colonoscopy    HPI:   54 year old male presents on referral from PCP for screening colonoscopy. PCP notes were reviewed in their entirety.  Was unable to be phone triage because of medications. Today he states he's never had a colonoscopy before. Had iFOBT in December which was negative per the patient. Denies abdominal pain, N/V, fever, chills, unintentional weight loss, hematochezia, melena. Denies any other upper or lower GI symptoms.  Past Medical History  Diagnosis Date  . Diabetes mellitus   . Hypertension   . High cholesterol     Past Surgical History  Procedure Laterality Date  . Back surgery    . Cholecystectomy      Current Outpatient Prescriptions  Medication Sig Dispense Refill  . amLODipine (NORVASC) 5 MG tablet Take 5 mg by mouth daily.      Marland Kitchen gabapentin (NEURONTIN) 800 MG tablet Take 800 mg by mouth 3 (three) times daily.     Marland Kitchen HYDROcodone-acetaminophen (NORCO) 7.5-325 MG per tablet Take 1 tablet by mouth 3 (three) times daily as needed for moderate pain.    Marland Kitchen lisinopril (PRINIVIL,ZESTRIL) 40 MG tablet Take 40 mg by mouth daily.    . NON FORMULARY PT HAS A C-PAP MACHINE    . nortriptyline (PAMELOR) 75 MG capsule Take 75 mg by mouth at bedtime.      Marland Kitchen tiZANidine (ZANAFLEX) 2 MG tablet Take 2 mg by mouth every 8 (eight) hours as needed for muscle spasms.    . traMADol (ULTRAM) 50 MG tablet Take 50 mg by mouth every 6 (six) hours as needed for moderate pain. Maximum dose= 8 tablets per day    . polyethylene glycol-electrolytes (TRILYTE) 420 G solution Take 4,000 mLs by mouth as directed. (Patient not taking: Reported on 03/01/2015) 4000 mL 0   No current facility-administered medications for this visit.    Allergies as of 03/24/2015  . (No Known Allergies)    No family history on file.  History   Social History  . Marital Status:  Married    Spouse Name: N/A  . Number of Children: N/A  . Years of Education: N/A   Occupational History  . Not on file.   Social History Main Topics  . Smoking status: Current Every Day Smoker  . Smokeless tobacco: Not on file  . Alcohol Use: No  . Drug Use: No  . Sexual Activity: Not on file   Other Topics Concern  . Not on file   Social History Narrative    Review of Systems: General: Negative for anorexia, weight loss, fever, chills, fatigue, weakness. Eyes: Negative for vision changes.  ENT: Negative for hoarseness, difficulty swallowing. CV: Negative for chest pain, angina, palpitations, peripheral edema.  Respiratory: Negative for dyspnea at rest, cough, sputum, wheezing.  GI: See history of present illness. MS: Admits chronic back pain.  Derm: Negative for rash or itching.  Neuro: Negative for weakness, seizure, frequent headaches, memory loss, confusion.  Psych: Negative for anxiety, depression.  Endo: Negative for unusual weight change.  Heme: Negative for bruising or bleeding. Allergy: Negative for rash or hives.    Physical Exam: BP 123/85 mmHg  Pulse 102  Temp(Src) 97.6 F (36.4 C) (Oral)  Ht 5\' 3"  (1.6 m)  Wt 207 lb 6.4 oz (94.076 kg)  BMI 36.75 kg/m2 General:   Alert and oriented. Pleasant and cooperative. Well-nourished and well-developed.  Head:  Normocephalic and atraumatic. Eyes:  Without icterus, sclera clear and conjunctiva pink.  Ears:  Normal auditory acuity. Mouth:  No deformity or lesions, oral mucosa pink.  Neck:  Supple, without mass or thyromegaly. Lungs:  Clear to auscultation bilaterally. No wheezes, rales, or rhonchi. No distress.  Heart:  S1, S2 present without murmurs appreciated.  Abdomen:  +BS, rounded, soft, non-tender and non-distended. No HSM noted. No guarding or rebound. No masses appreciated.  Rectal:  Deferred  Msk:  Symmetrical without gross deformities. Ambulates with a cane. Extremities:  Without clubbing or  edema. Neurologic:  Alert and  oriented x4;  grossly normal neurologically. Skin:  Intact without significant lesions or rashes. Cervical Nodes:  No significant cervical adenopathy. Psych:  Alert and cooperative. Normal mood and affect.     03/24/2015 2:04 PM

## 2015-03-24 NOTE — Patient Instructions (Signed)
1. We'll schedule your procedure (colonoscopy) for you today. 2. We will plan to do the procedure and the OR with heavier sedation to ensure that her comfortable during her procedure. 3. Further recommendations to be based on results your procedure.

## 2015-03-28 ENCOUNTER — Other Ambulatory Visit (HOSPITAL_COMMUNITY): Payer: Commercial Managed Care - HMO

## 2015-03-28 NOTE — Patient Instructions (Signed)
[name]  03/28/2015   Your procedure is scheduled on:  03/31/2015  Report to [name] at  845  AM.  Call this number if you have problems the morning of surgery: [phone]   Remember:   Do not eat food or drink liquids after midnight.   Take these medicines the morning of surgery with A SIP OF WATER:  Amlodipine, neurontin, norco, lisinopril, zanaflex, tramdol.   Do not wear jewelry, make-up or nail polish.  Do not wear lotions, powders, or perfumes.   Do not shave 48 hours prior to surgery. Men may shave face and neck.  Do not bring valuables to the hospital.  [facility] is not responsible for any belongings or valuables.               Contacts, dentures or bridgework may not be worn into surgery.  Leave suitcase in the car. After surgery it may be brought to your room.  For patients admitted to the hospital, discharge time is determined by your treatment team.               Patients discharged the day of surgery will not be allowed to drive home.  Name and phone number of your driver: family  Special Instructions: N/A   Please read over the following fact sheets that you were given: Pain Booklet, Coughing and Deep Breathing, Surgical Site Infection Prevention, Anesthesia Post-op Instructions and Care and Recovery After Surgery Colonoscopy A colonoscopy is an exam to look at the entire large intestine (colon). This exam can help find problems such as tumors, polyps, inflammation, and areas of bleeding. The exam takes about 1 hour.  LET [facility] CARE PROVIDER KNOW ABOUT:   Any allergies you have.  All medicines you are taking, including vitamins, herbs, eye drops, creams, and over-the-counter medicines.  Previous problems you or members of your family have had with the use of anesthetics.  Any blood disorders you have.  Previous surgeries you have had.  Medical conditions you have. RISKS AND COMPLICATIONS  Generally, this is a safe procedure. However, as  with any procedure, complications can occur. Possible complications include:  Bleeding.  Tearing or rupture of the colon wall.  Reaction to medicines given during the exam.  Infection (rare). BEFORE THE PROCEDURE   Ask your health care provider about changing or stopping your regular medicines.  You may be prescribed an oral bowel prep. This involves drinking a large amount of medicated liquid, starting the day before your procedure. The liquid will cause you to have multiple loose stools until your stool is almost clear or light green. This cleans out your colon in preparation for the procedure.  Do not eat or drink anything else once you have started the bowel prep, unless your health care provider tells you it is safe to do so.  Arrange for someone to drive you home after the procedure. PROCEDURE   You will be given medicine to help you relax (sedative).  You will lie on your side with your knees bent.  A long, flexible tube with a light and camera on the end (colonoscope) will be inserted through the rectum and into the colon. The camera sends video back to a computer screen as it moves through the colon. The colonoscope also releases carbon dioxide gas to inflate the colon. This helps your health care provider see the area better.  During the exam, your health care provider may take a small  tissue sample (biopsy) to be examined under a microscope if any abnormalities are found.  The exam is finished when the entire colon has been viewed. AFTER THE PROCEDURE   Do not drive for 24 hours after the exam.  You may have a small amount of blood in your stool.  You may pass moderate amounts of gas and have mild abdominal cramping or bloating. This is caused by the gas used to inflate your colon during the exam.  Ask when your test results will be ready and how you will get your results. Make sure you get your test results. Document Released: 11/02/2000 Document Revised: 08/26/2013  Document Reviewed: 07/13/2013 [facility] Patient Information 2015 [place], [place]. This information is not intended to replace advice given to you by your health care provider. Make sure you discuss any questions you have with your health care provider. PATIENT INSTRUCTIONS POST-ANESTHESIA  IMMEDIATELY FOLLOWING SURGERY:  Do not drive or operate machinery for the first twenty four hours after surgery.  Do not make any important decisions for twenty four hours after surgery or while taking narcotic pain medications or sedatives.  If you develop intractable nausea and vomiting or a severe headache please notify your doctor immediately.  FOLLOW-UP:  Please make an appointment with your surgeon as instructed. You do not need to follow up with anesthesia unless specifically instructed to do so.  WOUND CARE INSTRUCTIONS (if applicable):  Keep a dry clean dressing on the anesthesia/puncture wound site if there is drainage.  Once the wound has quit draining you may leave it open to air.  Generally you should leave the bandage intact for twenty four hours unless there is drainage.  If the epidural site drains for more than 36-48 hours please call the anesthesia department.  QUESTIONS?:  Please feel free to call your physician or the hospital operator if you have any questions, and they will be happy to assist you.

## 2015-03-29 ENCOUNTER — Encounter (HOSPITAL_COMMUNITY): Payer: Self-pay

## 2015-03-29 ENCOUNTER — Encounter (HOSPITAL_COMMUNITY)
Admission: RE | Admit: 2015-03-29 | Discharge: 2015-03-29 | Disposition: A | Discharge status: Home / Self Care | Payer: Commercial Managed Care - HMO | Source: Ambulatory Visit | Attending: Internal Medicine | Admitting: Internal Medicine

## 2015-03-29 ENCOUNTER — Other Ambulatory Visit: Payer: Self-pay

## 2015-03-29 DIAGNOSIS — K6389 Other specified diseases of intestine: Secondary | ICD-10-CM | POA: Diagnosis not present

## 2015-03-29 DIAGNOSIS — Z79899 Other long term (current) drug therapy: Secondary | ICD-10-CM | POA: Diagnosis not present

## 2015-03-29 DIAGNOSIS — Z79891 Long term (current) use of opiate analgesic: Secondary | ICD-10-CM | POA: Diagnosis not present

## 2015-03-29 DIAGNOSIS — E119 Type 2 diabetes mellitus without complications: Secondary | ICD-10-CM | POA: Diagnosis not present

## 2015-03-29 DIAGNOSIS — I10 Essential (primary) hypertension: Secondary | ICD-10-CM | POA: Diagnosis not present

## 2015-03-29 DIAGNOSIS — D125 Benign neoplasm of sigmoid colon: Secondary | ICD-10-CM | POA: Diagnosis not present

## 2015-03-29 DIAGNOSIS — K626 Ulcer of anus and rectum: Secondary | ICD-10-CM | POA: Diagnosis not present

## 2015-03-29 DIAGNOSIS — K5730 Diverticulosis of large intestine without perforation or abscess without bleeding: Secondary | ICD-10-CM | POA: Diagnosis not present

## 2015-03-29 DIAGNOSIS — C20 Malignant neoplasm of rectum: Secondary | ICD-10-CM | POA: Diagnosis not present

## 2015-03-29 DIAGNOSIS — R9431 Abnormal electrocardiogram [ECG] [EKG]: Secondary | ICD-10-CM | POA: Diagnosis not present

## 2015-03-29 DIAGNOSIS — Z1211 Encounter for screening for malignant neoplasm of colon: Secondary | ICD-10-CM | POA: Diagnosis not present

## 2015-03-29 DIAGNOSIS — G4730 Sleep apnea, unspecified: Secondary | ICD-10-CM | POA: Diagnosis not present

## 2015-03-29 DIAGNOSIS — Z87891 Personal history of nicotine dependence: Secondary | ICD-10-CM | POA: Diagnosis not present

## 2015-03-29 HISTORY — DX: Sleep apnea, unspecified: G47.30

## 2015-03-29 LAB — CBC WITH DIFFERENTIAL/PLATELET
BASOS ABS: 0 10*3/uL (ref 0.0–0.1)
BASOS PCT: 0 % (ref 0–1)
EOS PCT: 11 % — AB (ref 0–5)
Eosinophils Absolute: 0.5 10*3/uL (ref 0.0–0.7)
HEMATOCRIT: 41.1 % (ref 39.0–52.0)
Hemoglobin: 13.7 g/dL (ref 13.0–17.0)
Lymphocytes Relative: 35 % (ref 12–46)
Lymphs Abs: 1.7 10*3/uL (ref 0.7–4.0)
MCH: 29.7 pg (ref 26.0–34.0)
MCHC: 33.3 g/dL (ref 30.0–36.0)
MCV: 89.2 fL (ref 78.0–100.0)
Monocytes Absolute: 0.3 10*3/uL (ref 0.1–1.0)
Monocytes Relative: 7 % (ref 3–12)
Neutro Abs: 2.3 10*3/uL (ref 1.7–7.7)
Neutrophils Relative %: 47 % (ref 43–77)
Platelets: 165 10*3/uL (ref 150–400)
RBC: 4.61 MIL/uL (ref 4.22–5.81)
RDW: 12.5 % (ref 11.5–15.5)
WBC: 4.9 10*3/uL (ref 4.0–10.5)

## 2015-03-29 LAB — BASIC METABOLIC PANEL
ANION GAP: 7 (ref 5–15)
BUN: 13 mg/dL (ref 6–20)
CALCIUM: 9.6 mg/dL (ref 8.9–10.3)
CO2: 27 mmol/L (ref 22–32)
Chloride: 106 mmol/L (ref 101–111)
Creatinine, Ser: 1.05 mg/dL (ref 0.61–1.24)
Glucose, Bld: 79 mg/dL (ref 70–99)
Potassium: 4.3 mmol/L (ref 3.5–5.1)
Sodium: 140 mmol/L (ref 135–145)

## 2015-03-31 ENCOUNTER — Ambulatory Visit (HOSPITAL_COMMUNITY): Payer: Commercial Managed Care - HMO | Admitting: Anesthesiology

## 2015-03-31 ENCOUNTER — Encounter (HOSPITAL_COMMUNITY)
Admission: RE | Disposition: A | Discharge status: Home / Self Care | Payer: Self-pay | Source: Ambulatory Visit | Attending: Internal Medicine

## 2015-03-31 ENCOUNTER — Encounter (HOSPITAL_COMMUNITY): Payer: Self-pay | Admitting: *Deleted

## 2015-03-31 ENCOUNTER — Ambulatory Visit (HOSPITAL_COMMUNITY)
Admission: RE | Admit: 2015-03-31 | Discharge: 2015-03-31 | Disposition: A | Discharge status: Home / Self Care | Payer: Commercial Managed Care - HMO | Source: Ambulatory Visit | Attending: Internal Medicine | Admitting: Internal Medicine

## 2015-03-31 DIAGNOSIS — K626 Ulcer of anus and rectum: Secondary | ICD-10-CM | POA: Diagnosis not present

## 2015-03-31 DIAGNOSIS — C20 Malignant neoplasm of rectum: Secondary | ICD-10-CM

## 2015-03-31 DIAGNOSIS — I10 Essential (primary) hypertension: Secondary | ICD-10-CM | POA: Diagnosis not present

## 2015-03-31 DIAGNOSIS — Z1211 Encounter for screening for malignant neoplasm of colon: Secondary | ICD-10-CM | POA: Insufficient documentation

## 2015-03-31 DIAGNOSIS — K5730 Diverticulosis of large intestine without perforation or abscess without bleeding: Secondary | ICD-10-CM | POA: Insufficient documentation

## 2015-03-31 DIAGNOSIS — Z86010 Personal history of colonic polyps: Secondary | ICD-10-CM | POA: Insufficient documentation

## 2015-03-31 DIAGNOSIS — K6389 Other specified diseases of intestine: Secondary | ICD-10-CM | POA: Diagnosis not present

## 2015-03-31 DIAGNOSIS — D125 Benign neoplasm of sigmoid colon: Secondary | ICD-10-CM | POA: Insufficient documentation

## 2015-03-31 DIAGNOSIS — G4730 Sleep apnea, unspecified: Secondary | ICD-10-CM | POA: Insufficient documentation

## 2015-03-31 DIAGNOSIS — E119 Type 2 diabetes mellitus without complications: Secondary | ICD-10-CM | POA: Insufficient documentation

## 2015-03-31 DIAGNOSIS — Z79899 Other long term (current) drug therapy: Secondary | ICD-10-CM | POA: Insufficient documentation

## 2015-03-31 DIAGNOSIS — Z87891 Personal history of nicotine dependence: Secondary | ICD-10-CM | POA: Insufficient documentation

## 2015-03-31 DIAGNOSIS — R9431 Abnormal electrocardiogram [ECG] [EKG]: Secondary | ICD-10-CM | POA: Insufficient documentation

## 2015-03-31 DIAGNOSIS — Z79891 Long term (current) use of opiate analgesic: Secondary | ICD-10-CM | POA: Insufficient documentation

## 2015-03-31 HISTORY — PX: BIOPSY: SHX5522

## 2015-03-31 HISTORY — PX: COLONOSCOPY WITH PROPOFOL: SHX5780

## 2015-03-31 HISTORY — PX: POLYPECTOMY: SHX5525

## 2015-03-31 LAB — GLUCOSE, CAPILLARY
GLUCOSE-CAPILLARY: 84 mg/dL (ref 65–99)
Glucose-Capillary: 77 mg/dL (ref 65–99)

## 2015-03-31 SURGERY — COLONOSCOPY WITH PROPOFOL
Anesthesia: Monitor Anesthesia Care

## 2015-03-31 MED ORDER — FENTANYL CITRATE (PF) 100 MCG/2ML IJ SOLN
INTRAMUSCULAR | Status: AC
  Filled 2015-03-31: qty 2

## 2015-03-31 MED ORDER — FENTANYL CITRATE (PF) 100 MCG/2ML IJ SOLN
INTRAMUSCULAR | Status: DC | PRN
  Administered 2015-03-31 (×4): 25 ug via INTRAVENOUS

## 2015-03-31 MED ORDER — MIDAZOLAM HCL 2 MG/2ML IJ SOLN
INTRAMUSCULAR | Status: AC
  Filled 2015-03-31: qty 2

## 2015-03-31 MED ORDER — MIDAZOLAM HCL 2 MG/2ML IJ SOLN
1.0000 mg | INTRAMUSCULAR | Status: DC | PRN
  Administered 2015-03-31 (×2): 2 mg via INTRAVENOUS
  Administered 2015-03-31 (×2): 1 mg via INTRAVENOUS
  Filled 2015-03-31: qty 2

## 2015-03-31 MED ORDER — PROPOFOL 10 MG/ML IV BOLUS
INTRAVENOUS | Status: AC
  Filled 2015-03-31: qty 20

## 2015-03-31 MED ORDER — GLYCOPYRROLATE 0.2 MG/ML IJ SOLN
0.2000 mg | Freq: Once | INTRAMUSCULAR | Status: AC
  Administered 2015-03-31: 0.2 mg via INTRAVENOUS

## 2015-03-31 MED ORDER — FENTANYL CITRATE (PF) 100 MCG/2ML IJ SOLN
25.0000 ug | INTRAMUSCULAR | Status: AC
  Administered 2015-03-31 (×2): 25 ug via INTRAVENOUS

## 2015-03-31 MED ORDER — ONDANSETRON HCL 4 MG/2ML IJ SOLN
INTRAMUSCULAR | Status: AC
  Filled 2015-03-31: qty 2

## 2015-03-31 MED ORDER — EPHEDRINE SULFATE 50 MG/ML IJ SOLN
INTRAMUSCULAR | Status: AC
  Filled 2015-03-31: qty 1

## 2015-03-31 MED ORDER — ONDANSETRON HCL 4 MG/2ML IJ SOLN: 4.0000 mg | Freq: Once | INTRAMUSCULAR | Status: DC | PRN

## 2015-03-31 MED ORDER — SODIUM CHLORIDE 0.9 % IJ SOLN
INTRAMUSCULAR | Status: AC
  Filled 2015-03-31: qty 10

## 2015-03-31 MED ORDER — LACTATED RINGERS IV SOLN
INTRAVENOUS | Status: DC
  Administered 2015-03-31: 1000 mL via INTRAVENOUS

## 2015-03-31 MED ORDER — DEXTROSE 50 % IV SOLN
INTRAVENOUS | Status: AC
  Filled 2015-03-31: qty 50

## 2015-03-31 MED ORDER — LIDOCAINE HCL (PF) 1 % IJ SOLN
INTRAMUSCULAR | Status: AC
  Filled 2015-03-31: qty 5

## 2015-03-31 MED ORDER — FENTANYL CITRATE (PF) 100 MCG/2ML IJ SOLN: 25.0000 ug | INTRAMUSCULAR | Status: DC | PRN

## 2015-03-31 MED ORDER — ONDANSETRON HCL 4 MG/2ML IJ SOLN
4.0000 mg | Freq: Once | INTRAMUSCULAR | Status: AC
  Administered 2015-03-31: 4 mg via INTRAVENOUS

## 2015-03-31 MED ORDER — LIDOCAINE HCL (CARDIAC) 10 MG/ML IV SOLN
INTRAVENOUS | Status: DC | PRN
  Administered 2015-03-31: 50 mg via INTRAVENOUS

## 2015-03-31 MED ORDER — PROPOFOL INFUSION 10 MG/ML OPTIME
INTRAVENOUS | Status: DC | PRN
  Administered 2015-03-31: 125 ug/kg/min via INTRAVENOUS
  Administered 2015-03-31: 09:00:00 via INTRAVENOUS

## 2015-03-31 MED ORDER — STERILE WATER FOR IRRIGATION IR SOLN
Status: DC | PRN
  Administered 2015-03-31 (×2): 1000 mL

## 2015-03-31 MED ORDER — DEXTROSE 50 % IV SOLN
12.5000 g | Freq: Once | INTRAVENOUS | Status: AC
  Administered 2015-03-31: 12.5 g via INTRAVENOUS

## 2015-03-31 MED ORDER — GLYCOPYRROLATE 0.2 MG/ML IJ SOLN
INTRAMUSCULAR | Status: AC
  Filled 2015-03-31: qty 1

## 2015-03-31 MED ORDER — SUCCINYLCHOLINE CHLORIDE 20 MG/ML IJ SOLN
INTRAMUSCULAR | Status: AC
  Filled 2015-03-31: qty 1

## 2015-03-31 SURGICAL SUPPLY — 12 items
FCP BXJMBJMB 240X2.8X (CUTTING FORCEPS) ×2
FORCEPS BIOP RJ4 240 W/NDL (CUTTING FORCEPS) ×4
FORCEPS BXJMBJMB 240X2.8X (CUTTING FORCEPS) IMPLANT
FORMALIN 10 PREFIL 20ML (MISCELLANEOUS) ×4 IMPLANT
KIT CLEAN ENDO COMPLIANCE (KITS) ×4 IMPLANT
LUBRICANT JELLY 4.5OZ STERILE (MISCELLANEOUS) ×2 IMPLANT
MANIFOLD NEPTUNE II (INSTRUMENTS) ×2 IMPLANT
SNARE ROTATE MED OVAL 20MM (MISCELLANEOUS) ×2 IMPLANT
SYR 50ML LL SCALE MARK (SYRINGE) ×4 IMPLANT
TRAP SPECIMEN MUCOUS 40CC (MISCELLANEOUS) ×2 IMPLANT
TUBING IRRIGATION ENDOGATOR (MISCELLANEOUS) ×2 IMPLANT
WATER STERILE IRR 1000ML POUR (IV SOLUTION) ×4 IMPLANT

## 2015-03-31 NOTE — Interval H&P Note (Signed)
History and Physical Interval Note:  03/31/2015 8:00 AM  [name]  has presented today for surgery, with the diagnosis of screening colonoscopy  The various methods of treatment have been discussed with the patient and family. After consideration of risks, benefits and other options for treatment, the patient has consented to  Procedure(s): COLONOSCOPY WITH PROPOFOL (N/A) as a surgical intervention .  The patient's history has been reviewed, patient examined, no change in status, stable for surgery.  I have reviewed the patient's chart and labs.  Questions were answered to the patient's satisfaction.     [name]  No change. First ever average risk screening colonoscopy today per plan. Deep sedation per plan.  The risks, benefits, limitations, alternatives and imponderables have been reviewed with the patient. Questions have been answered. All parties are agreeable.

## 2015-03-31 NOTE — Transfer of Care (Signed)
Immediate Anesthesia Transfer of Care Note  Patient: [name]  Procedure(s) Performed: Procedure(s): COLONOSCOPY WITH PROPOFOL at cecum [phone]; withdrawal time=54minutes (N/A) POLYPECTOMY BIOPSY  Patient Location: PACU  Anesthesia Type:MAC  Level of Consciousness: awake, oriented and patient cooperative  Airway & Oxygen Therapy: Patient Spontanous Breathing and Patient connected to face mask oxygen  Post-op Assessment: Report given to RN and Post -op Vital signs reviewed and stable  Post vital signs: Reviewed and stable  Last Vitals:  Filed Vitals:   03/31/15 0800  BP: 132/90  Pulse:   Temp:   Resp: 14    Complications: No apparent anesthesia complications

## 2015-03-31 NOTE — Op Note (Signed)
[facility] [address] [place], [address]   COLONOSCOPY PROCEDURE REPORT  PATIENT: [name], [name]  MR#: [id number] BIRTHDATE: 1961/08/18 , 55  yrs. old GENDER: male ENDOSCOPIST: R.  Garfield Cornea, MD FACP [facility] REFERRED JG:[id number] Legrand Rams, M.D. PROCEDURE DATE:  04-06-2015 PROCEDURE:   Colonoscopy with biopsy and Colonoscopy with snare polypectomy INDICATIONS:First ever average risk colorectal cancer screening examination. MEDICATIONS: Deep sedation per Dr.  [facility] ASA CLASS:       Class II  CONSENT: The risks, benefits, alternatives and imponderables including but not limited to bleeding, perforation as well as the possibility of a missed lesion have been reviewed.  The potential for biopsy, lesion removal, etc. have also been discussed. Questions have been answered.  All parties agreeable.  Please see the history and physical in the medical record for more information.  DESCRIPTION OF PROCEDURE:   After the risks benefits and alternatives of the procedure were thoroughly explained, informed consent was obtained.  The digital rectal exam revealed no abnormalities of the rectum (mild anal stenosis).   The endoscope was introduced through the anus and advanced to the cecum, which was identified by both the appendix and ileocecal valve. No adverse events experienced.   The quality of the prep was inadequate  The instrument was then slowly withdrawn as the colon was fully examined.      COLON FINDINGS: Difficult examination.  This was an inadequately prepped colon today.  Patient intermittently violently coughed during the examination which made insufflation of the rectum and distal colon very difficult.  Markedly redundant, capacious colon. Examination of the rectal mucosa revealed a 3-4 cm mass in the rectum approximately 5 cm in from the anal verge; it was centrally depressed with ulceration.  It was relatively soft.  Please  see photos.  I was unable to thoroughly examine the rectal mucosa because of loss of air due to repeated coughing.  Examination of the colon demonstrated scattered left-sided diverticula; the colon was dilated and redundant diffusely.  External abdominal pressure and changing of the patient's position to right-side-down required to reach the cecum.  Cecum was reached.  The patient was noted to have (1) 5 mm polyp in the mid sigmoid otherwise, no gross colonic abnormalities were seen.  Again, prep inadequate.  Vegetable matter in the effluent throughout colon Clogging the scope repeated.  With coughing and I was unable to gain good visualization of all the mucosal surfaces.  The rectal mass was biopsied multiple times with the jumbo biopsy forceps.  Again, this lesion was relatively soft.  After the procedure I, again, could not definitely appreciated on it on digital rectal examination.  Retroflexion was not performed. .  Withdrawal time=22 minutes 0 seconds.  The scope was withdrawn and the procedure completed. COMPLICATIONS: There were no immediate complications.  ENDOSCOPIC IMPRESSION: Inadequately prepped colon. Capacious, dilated and redundant colon. Rectal mass as described?"biopsied. Sigmoid polyp ?"removed as described above.  Colon Not completely seen.  Colonic diverticulosis.  RECOMMENDATIONS: Follow up on pathology. Depending on path, patient may need a rectal ultrasound coupled with a repeat colonoscopy in the near future.  eSigned:  R. Garfield Cornea, MD Rosalita Chessman [facility] 06-Apr-2015 9:23 AM   cc:  CPT CODES: ICD CODES:  The ICD and CPT codes recommended by this software are interpretations from the data that the clinical staff has captured with the software.  The verification of the translation of this report to the ICD and CPT codes and modifiers is the  sole responsibility of the health care institution and practicing physician where this report was generated.   [employer] will not be held responsible for the validity of the ICD and CPT codes included on this report.  AMA assumes no liability for data contained or not contained herein. CPT is a Designer, television/film set of the [employer].  PATIENT NAME:  [name], [name] MR#: [id number]

## 2015-03-31 NOTE — Anesthesia Procedure Notes (Signed)
Procedure Name: MAC Date/Time: 03/31/2015 8:05 AM Performed by: Andree Elk, AMY A Pre-anesthesia Checklist: Patient identified, Timeout performed, Emergency Drugs available, Suction available and Patient being monitored Oxygen Delivery Method: Simple face mask

## 2015-03-31 NOTE — H&P (View-Only) (Signed)
Primary Care Physician:  Rosita Fire, MD Primary Gastroenterologist:  Dr. Gala Romney  Chief Complaint  Patient presents with  . Colonoscopy    HPI:   54 year old male presents on referral from PCP for screening colonoscopy. PCP notes were reviewed in their entirety.  Was unable to be phone triage because of medications. Today he states he's never had a colonoscopy before. Had iFOBT in December which was negative per the patient. Denies abdominal pain, N/V, fever, chills, unintentional weight loss, hematochezia, melena. Denies any other upper or lower GI symptoms.  Past Medical History  Diagnosis Date  . Diabetes mellitus   . Hypertension   . High cholesterol     Past Surgical History  Procedure Laterality Date  . Back surgery    . Cholecystectomy      Current Outpatient Prescriptions  Medication Sig Dispense Refill  . amLODipine (NORVASC) 5 MG tablet Take 5 mg by mouth daily.      Marland Kitchen gabapentin (NEURONTIN) 800 MG tablet Take 800 mg by mouth 3 (three) times daily.     Marland Kitchen HYDROcodone-acetaminophen (NORCO) 7.5-325 MG per tablet Take 1 tablet by mouth 3 (three) times daily as needed for moderate pain.    Marland Kitchen lisinopril (PRINIVIL,ZESTRIL) 40 MG tablet Take 40 mg by mouth daily.    . NON FORMULARY PT HAS A C-PAP MACHINE    . nortriptyline (PAMELOR) 75 MG capsule Take 75 mg by mouth at bedtime.      Marland Kitchen tiZANidine (ZANAFLEX) 2 MG tablet Take 2 mg by mouth every 8 (eight) hours as needed for muscle spasms.    . traMADol (ULTRAM) 50 MG tablet Take 50 mg by mouth every 6 (six) hours as needed for moderate pain. Maximum dose= 8 tablets per day    . polyethylene glycol-electrolytes (TRILYTE) 420 G solution Take 4,000 mLs by mouth as directed. (Patient not taking: Reported on 03/01/2015) 4000 mL 0   No current facility-administered medications for this visit.    Allergies as of 03/24/2015  . (No Known Allergies)    No family history on file.  History   Social History  . Marital Status:  Married    Spouse Name: N/A  . Number of Children: N/A  . Years of Education: N/A   Occupational History  . Not on file.   Social History Main Topics  . Smoking status: Current Every Day Smoker  . Smokeless tobacco: Not on file  . Alcohol Use: No  . Drug Use: No  . Sexual Activity: Not on file   Other Topics Concern  . Not on file   Social History Narrative    Review of Systems: General: Negative for anorexia, weight loss, fever, chills, fatigue, weakness. Eyes: Negative for vision changes.  ENT: Negative for hoarseness, difficulty swallowing. CV: Negative for chest pain, angina, palpitations, peripheral edema.  Respiratory: Negative for dyspnea at rest, cough, sputum, wheezing.  GI: See history of present illness. MS: Admits chronic back pain.  Derm: Negative for rash or itching.  Neuro: Negative for weakness, seizure, frequent headaches, memory loss, confusion.  Psych: Negative for anxiety, depression.  Endo: Negative for unusual weight change.  Heme: Negative for bruising or bleeding. Allergy: Negative for rash or hives.    Physical Exam: BP 123/85 mmHg  Pulse 102  Temp(Src) 97.6 F (36.4 C) (Oral)  Ht 5\' 3"  (1.6 m)  Wt 207 lb 6.4 oz (94.076 kg)  BMI 36.75 kg/m2 General:   Alert and oriented. Pleasant and cooperative. Well-nourished and well-developed.  Head:  Normocephalic and atraumatic. Eyes:  Without icterus, sclera clear and conjunctiva pink.  Ears:  Normal auditory acuity. Mouth:  No deformity or lesions, oral mucosa pink.  Neck:  Supple, without mass or thyromegaly. Lungs:  Clear to auscultation bilaterally. No wheezes, rales, or rhonchi. No distress.  Heart:  S1, S2 present without murmurs appreciated.  Abdomen:  +BS, rounded, soft, non-tender and non-distended. No HSM noted. No guarding or rebound. No masses appreciated.  Rectal:  Deferred  Msk:  Symmetrical without gross deformities. Ambulates with a cane. Extremities:  Without clubbing or  edema. Neurologic:  Alert and  oriented x4;  grossly normal neurologically. Skin:  Intact without significant lesions or rashes. Cervical Nodes:  No significant cervical adenopathy. Psych:  Alert and cooperative. Normal mood and affect.     03/24/2015 2:04 PM

## 2015-03-31 NOTE — Anesthesia Postprocedure Evaluation (Signed)
  Anesthesia Post-op Note  Patient: [name]  Procedure(s) Performed: Procedure(s): COLONOSCOPY WITH PROPOFOL at [facility] [phone]; withdrawal time=14minutes (N/A) POLYPECTOMY BIOPSY  Patient Location: PACU  Anesthesia Type:MAC  Level of Consciousness: awake, alert , oriented and patient cooperative  Airway and Oxygen Therapy: Patient Spontanous Breathing and Patient connected to face mask oxygen  Post-op Pain: none  Post-op Assessment: Post-op Vital signs reviewed, Patient's Cardiovascular Status Stable, Respiratory Function Stable, Patent Airway, No signs of Nausea or vomiting and Pain level controlled  Post-op Vital Signs: Reviewed and stable  Last Vitals:  Filed Vitals:   03/31/15 0800  BP: 132/90  Pulse:   Temp:   Resp: 14    Complications: No apparent anesthesia complications

## 2015-03-31 NOTE — Anesthesia Preprocedure Evaluation (Addendum)
Anesthesia Evaluation  Patient identified by MRN, date of birth, ID band Patient awake    Reviewed: Allergy & Precautions, NPO status , Patient's Chart, lab work & pertinent test results  Airway Mallampati: III  TM Distance: >3 FB     Dental  (+) Teeth Intact   Pulmonary sleep apnea , former smoker,  breath sounds clear to auscultation        Cardiovascular hypertension, Pt. on medications Rhythm:Regular Rate:Normal     Neuro/Psych    GI/Hepatic   Endo/Other  diabetes (borderlin, no meds), Type 2  Renal/GU      Musculoskeletal   Abdominal   Peds  Hematology   Anesthesia Other Findings   Reproductive/Obstetrics                            Anesthesia Physical Anesthesia Plan  ASA: II  Anesthesia Plan: MAC   Post-op Pain Management:    Induction: Intravenous  Airway Management Planned: Simple Face Mask  Additional Equipment:   Intra-op Plan:   Post-operative Plan:   Informed Consent: I have reviewed the patients History and Physical, chart, labs and discussed the procedure including the risks, benefits and alternatives for the proposed anesthesia with the patient or authorized representative who has indicated his/her understanding and acceptance.     Plan Discussed with:   Anesthesia Plan Comments: (1/2 amp D50W in pre-op for CBG=77.)       Anesthesia Quick Evaluation

## 2015-03-31 NOTE — Discharge Instructions (Signed)
°  Colonoscopy Discharge Instructions  Read the instructions outlined below and refer to this sheet in the next few weeks. These discharge instructions provide you with general information on caring for yourself after you leave the hospital. Your doctor may also give you specific instructions. While your treatment has been planned according to the most current medical practices available, unavoidable complications occasionally occur. If you have any problems or questions after discharge, call Dr. Gala Romney at [phone]. ACTIVITY  You may resume your regular activity, but move at a slower pace for the next 24 hours.   Take frequent rest periods for the next 24 hours.   Walking will help get rid of the air and reduce the bloated feeling in your belly (abdomen).   No driving for 24 hours (because of the medicine (anesthesia) used during the test).    Do not sign any important legal documents or operate any machinery for 24 hours (because of the anesthesia used during the test).  NUTRITION  Drink plenty of fluids.   You may resume your normal diet as instructed by your doctor.   Begin with a light meal and progress to your normal diet. Heavy or fried foods are harder to digest and may make you feel sick to your stomach (nauseated).   Avoid alcoholic beverages for 24 hours or as instructed.  MEDICATIONS  You may resume your normal medications unless your doctor tells you otherwise.  WHAT YOU CAN EXPECT TODAY  Some feelings of bloating in the abdomen.   Passage of more gas than usual.   Spotting of blood in your stool or on the toilet paper.  IF YOU HAD POLYPS REMOVED DURING THE COLONOSCOPY:  No aspirin products for 7 days or as instructed.   No alcohol for 7 days or as instructed.   Eat a soft diet for the next 24 hours.  FINDING OUT THE RESULTS OF YOUR TEST Not all test results are available during your visit. If your test results are not back during the visit, make an appointment  with your caregiver to find out the results. Do not assume everything is normal if you have not heard from your caregiver or the medical facility. It is important for you to follow up on all of your test results.  SEEK IMMEDIATE MEDICAL ATTENTION IF:  You have more than a spotting of blood in your stool.   Your belly is swollen (abdominal distention).   You are nauseated or vomiting.   You have a temperature over 101.   You have abdominal pain or discomfort that is severe or gets worse throughout the day.      Your rectum and colon were abnormal. Your preparation was poor. You will need to have a repeat colonoscopy in the near future.  Polyp information provided  Biopsies performed.  Further recommendations to follow pending review of pathology report

## 2015-04-01 ENCOUNTER — Encounter (HOSPITAL_COMMUNITY): Payer: Self-pay | Admitting: Internal Medicine

## 2015-04-02 ENCOUNTER — Encounter: Payer: Self-pay | Admitting: Internal Medicine

## 2015-04-02 NOTE — Progress Notes (Unsigned)
Patient ID: [name], male   DOB: 22-Sep-1961, 54 y.o.   MRN: [id number]   I have been unsuccessful in reaching pt by phone this weekend (all numbers).  Pt has rectal cancer; he needs to go to see Dr.Mishura at [facility] for a complete TCS(with adequate prep) to clear more proximal colon and stage rectal tumor via EUS.  Pt developed a violent cough with propofol which made examination of rectum and distal colon particularly challenging (difficulty with insufflation).  I would consider doing him under GA to control respirations and cough. Please send Dr. Berneice Heinrich my TCS note, path report and this note.

## 2015-04-04 ENCOUNTER — Other Ambulatory Visit: Payer: Self-pay

## 2015-04-04 DIAGNOSIS — D490 Neoplasm of unspecified behavior of digestive system: Secondary | ICD-10-CM

## 2015-04-04 NOTE — Progress Notes (Signed)
RMR results letter has been mailed to the pt.

## 2015-04-04 NOTE — Progress Notes (Signed)
Referral has been made to [facility] 

## 2015-04-06 NOTE — Progress Notes (Signed)
cc'ed to pcp °

## 2015-04-08 ENCOUNTER — Telehealth: Payer: Self-pay

## 2015-04-08 NOTE — Telephone Encounter (Signed)
[facility] called about getting him scheduled for an appointment but the phone number we have don't work. I have mailed him a letter to give [place] a call so we can update his numbers.

## 2015-04-11 NOTE — Telephone Encounter (Signed)
Discussed the results of the colonoscopy and subsequent pathology results with the patient. He verbalized understanding. Informed him that [name] from [facility] would be calling to arrange an appointment and stressed the importance of follow-up to make appropriate diagnosis and treatment decisions. Verbalized understanding and stated he would await [name]'s phone call.

## 2015-04-11 NOTE — Telephone Encounter (Signed)
I called Estill Bamberg at [facility] and gave him the patient's updated phone number.  Routing to [employer] for documentation purposes

## 2015-04-11 NOTE — Telephone Encounter (Signed)
Patient called in and I gave him the number to call [facility] at [phone] to schedule his procedure.  He also stated that his phone number we have listed is currently disconnected and we can reach him at [phone].  I send the call to [place] to speak with him about his results.

## 2015-05-02 DIAGNOSIS — M549 Dorsalgia, unspecified: Secondary | ICD-10-CM | POA: Diagnosis not present

## 2015-05-02 DIAGNOSIS — E785 Hyperlipidemia, unspecified: Secondary | ICD-10-CM | POA: Diagnosis not present

## 2015-05-02 DIAGNOSIS — K635 Polyp of colon: Secondary | ICD-10-CM | POA: Diagnosis not present

## 2015-05-02 DIAGNOSIS — I10 Essential (primary) hypertension: Secondary | ICD-10-CM | POA: Diagnosis not present

## 2015-05-26 DIAGNOSIS — K626 Ulcer of anus and rectum: Secondary | ICD-10-CM | POA: Diagnosis not present

## 2015-05-26 DIAGNOSIS — Z87891 Personal history of nicotine dependence: Secondary | ICD-10-CM | POA: Diagnosis not present

## 2015-05-26 DIAGNOSIS — Z86010 Personal history of colonic polyps: Secondary | ICD-10-CM | POA: Diagnosis not present

## 2015-05-26 DIAGNOSIS — C20 Malignant neoplasm of rectum: Secondary | ICD-10-CM | POA: Diagnosis not present

## 2015-05-26 DIAGNOSIS — E119 Type 2 diabetes mellitus without complications: Secondary | ICD-10-CM | POA: Diagnosis not present

## 2015-05-26 DIAGNOSIS — I10 Essential (primary) hypertension: Secondary | ICD-10-CM | POA: Diagnosis not present

## 2015-05-26 DIAGNOSIS — K629 Disease of anus and rectum, unspecified: Secondary | ICD-10-CM | POA: Diagnosis not present

## 2015-05-26 DIAGNOSIS — G4730 Sleep apnea, unspecified: Secondary | ICD-10-CM | POA: Diagnosis not present

## 2015-05-26 DIAGNOSIS — K5090 Crohn's disease, unspecified, without complications: Secondary | ICD-10-CM | POA: Diagnosis not present

## 2015-05-26 DIAGNOSIS — K219 Gastro-esophageal reflux disease without esophagitis: Secondary | ICD-10-CM | POA: Diagnosis not present

## 2015-05-31 ENCOUNTER — Telehealth: Payer: Self-pay | Admitting: Internal Medicine

## 2015-05-31 NOTE — Telephone Encounter (Signed)
Per Dr.Rourk, please refer to Dr.Penland asap.

## 2015-05-31 NOTE — Telephone Encounter (Signed)
Pt had called yesterday, he stated he had his testing done at [facility] and they confirmed it was cancer and that he needed to see a surgeon. He said he was going to bring by his reports for RMR to review.  He would rather see someone at [facility] for any treatments etc, than to go all the way to [facility]. He wanted to know if we could send him somewhere here.

## 2015-05-31 NOTE — Telephone Encounter (Signed)
Patient stopped by front office today to drop off his procedure report from [facility] for RMR to review. I placed them in RMR box. Patient also asked when would he be hearing back from [place] to get set up for his procedure with [place].  I told him that I would have to send a note to provider and someone would let him know. [phone]

## 2015-06-01 ENCOUNTER — Other Ambulatory Visit: Payer: Self-pay

## 2015-06-01 DIAGNOSIS — K6289 Other specified diseases of anus and rectum: Secondary | ICD-10-CM

## 2015-06-01 NOTE — Telephone Encounter (Signed)
Referral has been made.

## 2015-06-01 NOTE — Telephone Encounter (Signed)
Referral was sent to the cancer center [place] this morning, however per Ginger she will call this morning to follow-up

## 2015-06-01 NOTE — Telephone Encounter (Signed)
The patient to see Dr. Talbert Forest  ASAP

## 2015-06-02 ENCOUNTER — Encounter (HOSPITAL_COMMUNITY): Payer: Self-pay | Admitting: Lab

## 2015-06-02 ENCOUNTER — Encounter (HOSPITAL_COMMUNITY): Payer: Commercial Managed Care - HMO | Attending: Hematology & Oncology | Admitting: Hematology & Oncology

## 2015-06-02 ENCOUNTER — Encounter (HOSPITAL_COMMUNITY): Payer: Self-pay | Admitting: Hematology & Oncology

## 2015-06-02 VITALS — BP 117/77 | HR 101 | Temp 98.2°F | Resp 18 | Ht 64.0 in | Wt 206.6 lb

## 2015-06-02 DIAGNOSIS — C20 Malignant neoplasm of rectum: Secondary | ICD-10-CM | POA: Insufficient documentation

## 2015-06-02 LAB — CBC WITH DIFFERENTIAL/PLATELET
BASOS PCT: 1 % (ref 0–1)
Basophils Absolute: 0 10*3/uL (ref 0.0–0.1)
EOS PCT: 12 % — AB (ref 0–5)
Eosinophils Absolute: 0.6 10*3/uL (ref 0.0–0.7)
HCT: 44 % (ref 39.0–52.0)
Hemoglobin: 14.8 g/dL (ref 13.0–17.0)
LYMPHS ABS: 1.6 10*3/uL (ref 0.7–4.0)
Lymphocytes Relative: 32 % (ref 12–46)
MCH: 29.6 pg (ref 26.0–34.0)
MCHC: 33.6 g/dL (ref 30.0–36.0)
MCV: 88 fL (ref 78.0–100.0)
MONO ABS: 0.4 10*3/uL (ref 0.1–1.0)
MONOS PCT: 8 % (ref 3–12)
NEUTROS PCT: 47 % (ref 43–77)
Neutro Abs: 2.4 10*3/uL (ref 1.7–7.7)
Platelets: 166 10*3/uL (ref 150–400)
RBC: 5 MIL/uL (ref 4.22–5.81)
RDW: 12.4 % (ref 11.5–15.5)
WBC: 5 10*3/uL (ref 4.0–10.5)

## 2015-06-02 LAB — COMPREHENSIVE METABOLIC PANEL
ALBUMIN: 4.4 g/dL (ref 3.5–5.0)
ALT: 28 U/L (ref 17–63)
AST: 26 U/L (ref 15–41)
Alkaline Phosphatase: 69 U/L (ref 38–126)
Anion gap: 10 (ref 5–15)
BUN: 12 mg/dL (ref 6–20)
CO2: 23 mmol/L (ref 22–32)
Calcium: 9.2 mg/dL (ref 8.9–10.3)
Chloride: 104 mmol/L (ref 101–111)
Creatinine, Ser: 1.1 mg/dL (ref 0.61–1.24)
Glucose, Bld: 77 mg/dL (ref 65–99)
Potassium: 3.9 mmol/L (ref 3.5–5.1)
SODIUM: 137 mmol/L (ref 135–145)
Total Bilirubin: 0.7 mg/dL (ref 0.3–1.2)
Total Protein: 8.4 g/dL — ABNORMAL HIGH (ref 6.5–8.1)

## 2015-06-02 NOTE — Progress Notes (Signed)
[place] at [facility] Progress Note  Patient Care Team: Rosita Fire, MD as PCP - General (Internal Medicine) Daneil Dolin, MD as Consulting Physician (Gastroenterology)  CHIEF COMPLAINTS/PURPOSE OF CONSULTATION:  Newly diagnosed T1/T2 N0 adenocarcinoma of the rectum  HISTORY OF PRESENTING ILLNESS:  [name] 54 y.o. male is here because of newly diagnosed rectal cancer. The patient underwent a colonoscopy on 5/12 with Dr. Gala Romney. He was noted to have a rectal mass which was biopsied. Biopsy revealed an invasive adenocarcinoma.  One tubular adenoma was removed in the sigmoid that was negative for dysplasia.  The prep was documented as poor, in addition the patient coughed multiple times throughout the procedure making insufflation of the rectum and distal colon difficult. CBC with differential on 5/10 was WNL.  He was then referred to [facility] for a colonoscopy and RUS using deep sedation with propofol or general anesthesia. Rectal Ultrasound noted a lesion at first rectal fold approximately 5 cm from the anal verge.  1.7cm x 0.5 cm hypoechoic lesion, posterior that invades into the submucosa and touches the muscularis propria but not through. No adenopthy, T1/T2 N0 lesion.   The patient is alone today and presents to discuss his recent scans and ultrasound.  He is the first in his family to be diagnosed with cancer.  He says that he was shocked about the result after his regular colonoscopy.  He now seems worried and says that he wants to be around as a support system for his youngest son.  He is currently going through a divorce. Overall, he says that he feels good. He denies any weight loss, No rectal pain. He denies any bleeding in his stool.  He is compliant with c-pap  for his sleep apnea. He has no complaints at this time.  MEDICAL HISTORY:  Past Medical History  Diagnosis Date   Diabetes mellitus    Hypertension    High cholesterol    Sleep apnea     Rectal cancer     SURGICAL HISTORY: Past Surgical History  Procedure Laterality Date   Back surgery     Cholecystectomy     Colonoscopy with propofol N/A 03/31/2015    Procedure: COLONOSCOPY WITH PROPOFOL at cecum 0842; withdrawal time=3mnutes;  Surgeon: RDaneil Dolin MD;  Location: [facility];  Service: Endoscopy;  Laterality: N/A;   Polypectomy  03/31/2015    Procedure: POLYPECTOMY;  Surgeon: RDaneil Dolin MD;  Location: [facility];  Service: Endoscopy;;   Esophageal biopsy  03/31/2015    Procedure: BIOPSY;  Surgeon: RDaneil Dolin MD;  Location: [facility];  Service: Endoscopy;;    SOCIAL HISTORY: History   Social History   Marital Status: Legally Separated    Spouse Name: N/A   Number of Children: N/A   Years of Education: N/A   Occupational History   Not on file.   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  Mother deceased, 28, liver cancer Father living, 78 3 brothers 1 sister  ALLERGIES:  has No Known Allergies.  MEDICATIONS:  Current Outpatient Prescriptions  Medication Sig Dispense Refill   amLODipine (NORVASC) 5 MG tablet Take 5 mg by mouth daily.       gabapentin (NEURONTIN) 800 MG tablet Take 800 mg by mouth 3 (three) times daily.       HYDROcodone-acetaminophen (NORCO) 7.5-325 MG per tablet Take 1 tablet by mouth 3 (three) times daily as needed for moderate pain.     lisinopril (PRINIVIL,ZESTRIL) 40 MG tablet Take 40 mg by mouth daily.     NON FORMULARY PT HAS A C-PAP MACHINE     nortriptyline (PAMELOR) 75 MG capsule Take 75 mg by mouth at bedtime.       tiZANidine (ZANAFLEX) 2 MG tablet Take 2 mg by mouth every 8 (eight) hours as needed for muscle spasms.     traMADol (ULTRAM) 50 MG tablet Take 50 mg by mouth 3 (three) times daily as needed for moderate pain. Maximum dose= 8 tablets per day     polyethylene glycol-electrolytes (NULYTELY/GOLYTELY) 420 G solution Take 4,000 mLs by mouth once. (Patient not taking: Reported on 06/02/2015) 4000 mL 0   polyethylene glycol-electrolytes (TRILYTE) 420 G solution Take 4,000 mLs by mouth as directed. (Patient not taking: Reported on 03/01/2015) 4000 mL 0   No current facility-administered medications for this visit.    Review of Systems  Constitutional: Negative for fever, chills, weight loss and malaise/fatigue.  HENT: Negative for congestion, hearing loss, nosebleeds, sore throat and tinnitus.   Eyes: Negative for blurred vision, double vision, pain and discharge.  Respiratory: Negative for cough, hemoptysis, sputum production, shortness of breath and wheezing.   Cardiovascular: Negative for chest pain, palpitations, claudication, leg swelling and PND.  Gastrointestinal: Negative for heartburn, nausea, vomiting, abdominal pain, diarrhea, constipation, blood in stool and melena.  Genitourinary: Negative for dysuria, urgency, frequency and hematuria.  Musculoskeletal: Negative for myalgias, joint pain and falls.  Skin: Negative for itching and rash.  Neurological: Negative for dizziness, tingling, tremors, sensory change, speech change, focal weakness, seizures, loss of consciousness, weakness and headaches.  Endo/Heme/Allergies: Does not bruise/bleed easily.    Psychiatric/Behavioral: Negative for depression, suicidal ideas, memory loss and substance abuse. The patient is not nervous/anxious and does not have insomnia.   All other systems reviewed and are negative.  14 point ROS was done and is otherwise as detailed above or in HPI   PHYSICAL EXAMINATION: ECOG PERFORMANCE STATUS: 0 - Asymptomatic  Filed Vitals:   06/02/15 1500  BP: 117/77  Pulse: 101  Temp: 98.2 F (36.8 C)  Resp: 18   Filed Weights   06/02/15 1500  Weight: 206 lb 9.6 oz (93.713 kg)    Physical Exam  Constitutional: He is oriented to person, place, and time and well-developed, well-nourished, and in no distress.  HENT:  Head: Normocephalic and atraumatic.  Nose: Nose normal.  Mouth/Throat: Oropharynx is clear and moist. No oropharyngeal exudate.  Eyes: Conjunctivae and EOM are normal. Pupils are equal, round, and reactive to light. Right eye exhibits no discharge. Left eye  exhibits no discharge. No scleral icterus.  Neck: Normal range of motion. Neck supple. No tracheal deviation present. No thyromegaly present.  Cardiovascular: Normal rate, regular rhythm and normal heart sounds.  Exam reveals no gallop and no friction rub.   No murmur heard. Pulmonary/Chest: Effort normal and breath sounds normal. He has no wheezes. He has no rales.  Abdominal: Soft. Bowel sounds are normal. He exhibits no distension and no mass. There is no tenderness. There is no rebound and no guarding.  Genitourinary:  DEFERRED rectal exam  Musculoskeletal: Normal range of motion. He exhibits no edema.  Lymphadenopathy:    He has no cervical adenopathy.  Neurological: He is alert and oriented to person, place, and time. He has normal reflexes. No cranial nerve deficit. Gait normal. Coordination normal.  Skin: Skin is warm and dry. No rash noted.  Psychiatric: Mood, memory, affect and judgment normal.  Nursing note and vitals reviewed.  PATHOLOGY: ACCESSION NUMBER:[id number]  RECEIVED:  05/27/2015  ORDERING PHYSICIAN:GIRISH MISHRA , MD  PATIENT NAME:[name], [name]  SURGICAL PATHOLOGY REPORT    FINAL PATHOLOGIC DIAGNOSIS  MICROSCOPIC EXAMINATION AND DIAGNOSIS    COLON, RECTAL, BIOPSY:  Invasive adenocarcinoma, moderately differentiated.      I have personally reviewed the slides and/or other related  materials referenced, and have edited the report as part of my  pathologic assessment and final interpretation.    Electronically Signed Out By: Jerrye Noble, M.D.,  Pathology 05/30/2015 06:51:09   LABORATORY DATA:  I have reviewed the data as listed  Results for RALF, KONOPKA (MRN [id number])  Ref. Range 06/02/2015 16:00  Sodium Latest Ref Range: 135-145 mmol/L 137  Potassium Latest Ref Range: 3.5-5.1 mmol/L 3.9  Chloride Latest Ref Range: 101-111 mmol/L 104  CO2 Latest Ref Range: 22-32 mmol/L 23  BUN Latest Ref Range: 6-20 mg/dL 12  Creatinine Latest Ref Range: 0.61-1.24 mg/dL 1.10  Calcium Latest Ref Range: 8.9-10.3 mg/dL 9.2  EGFR (Non-African Amer.) Latest Ref Range: >60 mL/min >60  EGFR (African American) Latest Ref Range: >60 mL/min >60  Glucose Latest Ref Range: 65-99 mg/dL 77  Anion gap Latest Ref Range: 5-15  10  Alkaline Phosphatase Latest Ref Range: 38-126 U/L 69  Albumin Latest Ref Range: 3.5-5.0 g/dL 4.4  AST Latest Ref Range: 15-41 U/L 26  ALT Latest Ref Range: 17-63 U/L 28  Total Protein Latest Ref Range: 6.5-8.1 g/dL 8.4 (H)  Total Bilirubin Latest Ref Range: 0.3-1.2 mg/dL 0.7  WBC Latest Ref Range: 4.0-10.5 K/uL 5.0  RBC Latest Ref Range: 4.22-5.81 MIL/uL 5.00  Hemoglobin Latest Ref Range: 13.0-17.0 g/dL 14.8  HCT Latest Ref Range: 39.0-52.0 % 44.0  MCV Latest Ref Range: 78.0-100.0 fL 88.0  MCH Latest Ref Range: 26.0-34.0 pg 29.6  MCHC Latest Ref Range: 30.0-36.0 g/dL 33.6  RDW Latest Ref Range: 11.5-15.5 % 12.4  Platelets Latest Ref Range: 150-400 K/uL 166  Neutrophils Latest Ref Range: 43-77 % 47    Lymphocytes Latest Ref Range: 12-46 % 32  Monocytes Relative Latest Ref Range: 3-12 % 8  Eosinophil Latest Ref Range: 0-5 % 12 (H)  Basophil Latest Ref Range: 0-1 % 1  NEUT# Latest Ref Range: 1.7-7.7 K/uL 2.4  Lymphocyte # Latest Ref Range: 0.7-4.0 K/uL 1.6  Monocyte # Latest Ref Range: 0.1-1.0 K/uL 0.4  Eosinophils Absolute Latest Ref Range: 0.0-0.7 K/uL 0.6  Basophils Absolute Latest Ref Range: 0.0-0.1 K/uL 0.0  CEA Latest Ref Range: 0.0-4.7 ng/mL 4.3    ASSESSMENT & PLAN:  Newly diagnosed T1/T2 N0  adenocarcinoma of the rectum  Based upon his endoscopic ultrasound the patient has an early rectal carcinoma. I reviewed national guidelines with the patient and advised him that I currently do not see an indication for neo-adjuvant chemotherapy/radiation. In regards to surgery he would like to go to either [facility] or [place]. I have made a referral to [facility] for additional surgical consultation. I advised him that surgical pathology is the gold standard. Should his final surgical pathology reveal more advanced disease than we may have to regroup afterwards, however this appears to be an early lesion that can be treated with surgery alone.  I do not have a copy of his final colonoscopy from [facility] but he did have a complete colonoscopy. We will try to obtain copies of that report. I will plan on seeing him back after his appointment with surgery.  We will obtain laboratory studies today including a CBC, CMP and CEA. We will also arrange for CT scan of the abdomen and pelvis. We discussed potential genetics counseling. I do not have MSI testing on his tumor, but I advised the patient that this is routinely performed.   Orders Placed This Encounter  Procedures   CT Abdomen Pelvis W Contrast    Standing Status: Future     Number of Occurrences: 1     Standing Expiration Date: 06/01/2016    Order Specific Question:  Reason for Exam (SYMPTOM  OR DIAGNOSIS REQUIRED)    Answer:  rectal  cancer    Order Specific Question:  Preferred imaging location?    Answer:  [facility]   CBC with Differential   Comprehensive metabolic panel   CEA    All questions were answered. The patient knows to call the clinic with any problems, questions or concerns.   This document serves as a record of services personally performed by Ancil Linsey, MD. It was created on her behalf by Janace Hoard, a trained medical scribe. The creation of this record is based on the scribe's personal observations and the provider's statements to them. This document has been checked and approved by the attending provider.  I have reviewed the above documentation for accuracy and completeness, and I agree with the above.  This note was electronically signed.  Kelby Fam. Whitney Muse, MD

## 2015-06-02 NOTE — Progress Notes (Signed)
Referral sent to Dr [name] at [place] on 7/14.  Records faxed on 7/14

## 2015-06-02 NOTE — Progress Notes (Signed)
Pollyann Samples presented for labwork. Labs per MD order drawn via Peripheral Line 23 gauge needle inserted in right AC  Good blood return present. Procedure without incident.  Needle removed intact. Patient tolerated procedure well.

## 2015-06-02 NOTE — Patient Instructions (Addendum)
[place] at [facility] Discharge Instructions  RECOMMENDATIONS MADE BY THE CONSULTANT AND ANY TEST RESULTS WILL BE SENT TO YOUR REFERRING PHYSICIAN.  Exam and discussion by Dr. Whitney Muse. She will get in touch with one of the surgeons and will get you scheduled for consultation with Dr. [name].  They will call you with the date and time of your appointment. Will check blood work today We will do some scans to check if there are any other areas of involvement.  Follow-up in 1 month.  Thank you for choosing [place] at [facility] to provide your oncology and hematology care.  To afford each patient quality time with our provider, please arrive at least 15 minutes before your scheduled appointment time.    You need to re-schedule your appointment should you arrive 10 or more minutes late.  We strive to give you quality time with our providers, and arriving late affects you and other patients whose appointments are after yours.  Also, if you no show three or more times for appointments you may be dismissed from the clinic at the providers discretion.     Again, thank you for choosing [facility]  Our hope is that these requests will decrease the amount of time that you wait before being seen by our physicians.       _____________________________________________________________  Should you have questions after your visit to [facility], please contact our office at [phone] between the hours of 8:30 a.m. and 4:30 p.m.  Voicemails left after 4:30 p.m. will not be returned until the following business day.  For prescription refill requests, have your pharmacy contact our office.

## 2015-06-03 LAB — CEA: CEA: 4.3 ng/mL (ref 0.0–4.7)

## 2015-06-03 NOTE — Telephone Encounter (Signed)
Pt was seen on 06/02/2015 by Dr. Whitney Muse

## 2015-06-07 DIAGNOSIS — G5600 Carpal tunnel syndrome, unspecified upper limb: Secondary | ICD-10-CM | POA: Diagnosis not present

## 2015-06-07 DIAGNOSIS — I10 Essential (primary) hypertension: Secondary | ICD-10-CM | POA: Diagnosis not present

## 2015-06-07 DIAGNOSIS — M545 Low back pain: Secondary | ICD-10-CM | POA: Diagnosis not present

## 2015-06-07 DIAGNOSIS — Z79899 Other long term (current) drug therapy: Secondary | ICD-10-CM | POA: Diagnosis not present

## 2015-06-08 ENCOUNTER — Ambulatory Visit (HOSPITAL_COMMUNITY): Payer: Commercial Managed Care - HMO

## 2015-06-08 ENCOUNTER — Ambulatory Visit (HOSPITAL_COMMUNITY)
Admission: RE | Admit: 2015-06-08 | Discharge: 2015-06-08 | Disposition: A | Discharge status: Home / Self Care | Payer: Commercial Managed Care - HMO | Source: Ambulatory Visit | Attending: Hematology & Oncology | Admitting: Hematology & Oncology

## 2015-06-08 DIAGNOSIS — C20 Malignant neoplasm of rectum: Secondary | ICD-10-CM | POA: Diagnosis not present

## 2015-06-08 MED ORDER — IOHEXOL 300 MG/ML  SOLN
100.0000 mL | Freq: Once | INTRAMUSCULAR | Status: AC | PRN
  Administered 2015-06-08: 100 mL via INTRAVENOUS

## 2015-06-14 ENCOUNTER — Other Ambulatory Visit: Payer: Self-pay | Admitting: General Surgery

## 2015-06-14 DIAGNOSIS — C799 Secondary malignant neoplasm of unspecified site: Secondary | ICD-10-CM

## 2015-06-14 DIAGNOSIS — C20 Malignant neoplasm of rectum: Secondary | ICD-10-CM | POA: Diagnosis not present

## 2015-06-14 NOTE — H&P (Signed)
[name] 06/14/2015 9:48 AM Location: [facility] Patient #: [id number] DOB: 08-Aug-1961 Divorced / Language: [name] / Race: Black or African American Male History of Present Illness Douglas Ruff MD; 8/54/6270 10:52 AM) Patient words: rectal cancer.  The patient is a 54 year old male who presents with colorectal cancer. This is a 54 year old male who presents to the office with a newly diagnosed rectal cancer. This was noted by Dr. Gala [name] on screening colonoscopy. This was noted to be approximately 5 cm from the anal verge on colonoscopy. CT scan of the abdomen shows no signs of metastatic disease. There is some rectal thickening at the level of the coccyx. CEA is normal. Rectal ultrasound shows a T1/T2 lesion with no lymph node involvement noted. The patient is asymptomatic. He denies any rectal bleeding. He has regular bowel movements. Other Problems Douglas Lorenzo, LPN; 3/50/0938 1:82 AM) Arthritis Back Pain Diabetes Mellitus High blood pressure Rectal Cancer Sleep Apnea  Past Surgical History Douglas Lorenzo, LPN; 9/93/7169 6:78 AM) Gallbladder Surgery - Open Spinal Surgery - Lower Back Spinal Surgery Midback  Diagnostic Studies History Douglas Lorenzo, LPN; 9/38/1017 5:10 AM) Colonoscopy never  Allergies Douglas Lorenzo, LPN; 2/58/5277 8:24 AM) No Known Drug Allergies 06/14/2015  Medication History Douglas Lorenzo, LPN; 2/35/3614 4:31 AM) AmLODIPine Besylate (5MG  Tablet, Oral) Active. Gabapentin (800MG  Tablet, Oral) Active. Lisinopril (40MG  Tablet, Oral) Active. Nortriptyline HCl (75MG  Capsule, Oral) Active. TiZANidine HCl (2MG  Tablet, Oral) Active. Medications Reconciled  Social History Douglas Lorenzo, LPN; 5/40/0867 6:19 AM) Alcohol use Remotely quit alcohol use. Caffeine use Coffee, Tea. Illicit drug use Remotely quit drug use. Tobacco use Never smoker.  Family History Douglas Lorenzo, LPN; 03/27/3266 1:24 AM) Arthritis  Father. Diabetes Mellitus Father. Hypertension Father.     Review of Systems Douglas Billings Dockery LPN; 5/80/9983 3:82 AM) General Not Present- Appetite Loss, Chills, Fatigue, Fever, Night Sweats, Weight Gain and Weight Loss. Skin Not Present- Change in Wart/Mole, Dryness, Hives, Jaundice, New Lesions, Non-Healing Wounds, Rash and Ulcer. HEENT Present- Wears glasses/contact lenses. Not Present- Earache, Hearing Loss, Hoarseness, Nose Bleed, Oral Ulcers, Ringing in the Ears, Seasonal Allergies, Sinus Pain, Sore Throat, Visual Disturbances and Yellow Eyes. Respiratory Not Present- Bloody sputum, Chronic Cough, Difficulty Breathing, Snoring and Wheezing. Breast Not Present- Breast Mass, Breast Pain, Nipple Discharge and Skin Changes. Cardiovascular Not Present- Chest Pain, Difficulty Breathing Lying Down, Leg Cramps, Palpitations, Rapid Heart Rate, Shortness of Breath and Swelling of Extremities. Gastrointestinal Not Present- Abdominal Pain, Bloating, Bloody Stool, Change in Bowel Habits, Chronic diarrhea, Constipation, Difficulty Swallowing, Excessive gas, Gets full quickly at meals, Hemorrhoids, Indigestion, Nausea, Rectal Pain and Vomiting. Male Genitourinary Not Present- Blood in Urine, Change in Urinary Stream, Frequency, Impotence, Nocturia, Painful Urination, Urgency and Urine Leakage.  Vitals Douglas Billings Dockery LPN; 03/24/3975 7:34 AM) 06/14/2015 9:53 AM Weight: 214.6 lb Height: 63in Body Surface Area: 2.08 m Body Mass Index: 38.01 kg/m Temp.: 11F(Oral)  Pulse: 104 (Regular)  BP: 128/82 (Sitting, Left Arm, Standard)     Physical Exam Douglas Ruff MD; 1/93/7902 12:40 PM)  General Mental Status-Alert. General Appearance-Consistent with stated age. Hydration-Well hydrated. Voice-Normal.  Head and Neck Head-normocephalic, atraumatic with no lesions or palpable masses. Trachea-midline. Thyroid Gland Characteristics - normal size and  consistency.  Eye Eyeball - Bilateral-Extraocular movements intact. Sclera/Conjunctiva - Bilateral-No scleral icterus.  Chest and Lung Exam Chest and lung exam reveals -quiet, even and easy respiratory effort with no use of accessory muscles and on auscultation, normal breath sounds, no adventitious sounds and normal vocal resonance.  Inspection Chest Wall - Normal. Back - normal.  Cardiovascular Cardiovascular examination reveals -normal heart sounds, regular rate and rhythm with no murmurs and normal pedal pulses bilaterally.  Abdomen Inspection Inspection of the abdomen reveals - No Hernias. Palpation/Percussion Palpation and Percussion of the abdomen reveal - Soft, Non Tender, No Rebound tenderness, No Rigidity (guarding) and No hepatosplenomegaly. Auscultation Auscultation of the abdomen reveals - Bowel sounds normal.  Rectal Anorectal Exam External - normal external exam. Internal - Note: No mass palpated.  Neurologic Neurologic evaluation reveals -alert and oriented x 3 with no impairment of recent or remote memory. Mental Status-Normal.  Musculoskeletal Global Assessment -Note:no gross deformities.  Normal Exam - Left-Upper Extremity Strength Normal and Lower Extremity Strength Normal. Normal Exam - Right-Upper Extremity Strength Normal and Lower Extremity Strength Normal.    Assessment & Plan Douglas Ruff MD; 6/65/9935 12:42 PM)  PRIMARY CANCER OF RECTUM (154.1  C20) Impression: 54 year old male who was found to have a rectal mass on screening colonoscopy. 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 CT scan of the abdomen showed no signs of metastatic disease and CEA was normal. Rectal ultrasound showed a T1/T2 N0 lesion. This does not appear that it was tattooed. I have recommended the patient undergo upfront low anterior resection with possible diverting ileostomy. We discussed this in detail. We discussed that we would like to get a CT  scan of his chest to complete his metastatic workup. I will also plan to do a flexible proctoscopy for purpose of tattooing the lesion the day before his surgery.  The surgery and anatomy were described to the patient as well as the risks of surgery and the possible complications. These include: Bleeding, deep abdominal infections and possible wound complications such as hernia and infection, damage to adjacent structures, leak of surgical connections, which can lead to other surgeries and possibly an ostomy, possible need for other procedures, such as abscess drains in radiology, possible prolonged hospital stay, possible diarrhea from removal of part of the colon, possible constipation from narcotics, possible bowel, bladder or sexual dysfunction if having rectal surgery, prolonged fatigue/weakness or appetite loss, possible early recurrence of of disease, possible complications of their medical problems such as heart disease or arrhythmias or lung problems, death (less than 1%). I believe the patient understands and wishes to proceed with the surgery.

## 2015-06-16 ENCOUNTER — Other Ambulatory Visit: Payer: Self-pay | Admitting: General Surgery

## 2015-06-16 DIAGNOSIS — C20 Malignant neoplasm of rectum: Secondary | ICD-10-CM

## 2015-06-20 ENCOUNTER — Inpatient Hospital Stay: Admission: RE | Admit: 2015-06-20 | Payer: Commercial Managed Care - HMO | Source: Ambulatory Visit

## 2015-06-24 ENCOUNTER — Ambulatory Visit
Admission: RE | Admit: 2015-06-24 | Discharge: 2015-06-24 | Disposition: A | Discharge status: Home / Self Care | Payer: Commercial Managed Care - HMO | Source: Ambulatory Visit | Attending: General Surgery | Admitting: General Surgery

## 2015-06-24 DIAGNOSIS — C20 Malignant neoplasm of rectum: Secondary | ICD-10-CM

## 2015-06-24 MED ORDER — IOPAMIDOL (ISOVUE-300) INJECTION 61%
75.0000 mL | Freq: Once | INTRAVENOUS | Status: AC | PRN
  Administered 2015-06-24: 75 mL via INTRAVENOUS

## 2015-06-29 NOTE — Patient Instructions (Addendum)
DEMONTE DOBRATZ  06/29/2015   Your procedure is scheduled on:  July 06, 2015  Report to [facility] Main  Entrance take [place]  elevators to 3rd floor to  [place] at  6:30 AM.  Call this number if you have problems the morning of surgery [phone]   Remember: ONLY 1 PERSON MAY GO WITH YOU TO SHORT STAY TO GET  READY MORNING OF [place].  Do not eat food or drink liquids :After Midnight.              Bowel prep as instructed by physician             Bring mask and tubing from c-pap machine.    Take these medicines the morning of surgery with A SIP OF WATER: Amlodipine (Norvasc), Gabapentin                               You may not have any metal on your body including hair pins and              piercings  Do not wear jewelry, , lotions, powders or perfumes, deodorant                       Men may shave face and neck.   Do not bring valuables to the hospital. [place].  Contacts, dentures or bridgework may not be worn into surgery.  Leave suitcase in the car. After surgery it may be brought to your room.   Marland Kitchen   Special Instructions:  Coughing and deep breathing exercises, leg exercises              Please read over the following fact sheets you were given: _____________________________________________________________________             [facility] - Preparing for Surgery Before surgery, you can play an important role.  Because skin is not sterile, your skin needs to be as free of germs as possible.  You can reduce the number of germs on your skin by washing with CHG (chlorahexidine gluconate) soap before surgery.  CHG is an antiseptic cleaner which kills germs and bonds with the skin to continue killing germs even after washing. Please DO NOT use if you have an allergy to CHG or antibacterial soaps.  If your skin becomes reddened/irritated stop using the CHG and inform your nurse when you arrive  at Short Stay. Do not shave (including legs and underarms) for at least 48 hours prior to the first CHG shower.  You may shave your face/neck. Please follow these instructions carefully:  1.  Shower with CHG Soap the night before surgery and the  morning of Surgery.  2.  If you choose to wash your hair, wash your hair first as usual with your  normal  shampoo.  3.  After you shampoo, rinse your hair and body thoroughly to remove the  shampoo.                           4.  Use CHG as you would any other liquid soap.  You can apply chg directly  to the skin and wash  Gently with a scrungie or clean washcloth.  5.  Apply the CHG Soap to your body ONLY FROM THE NECK DOWN.   Do not use on face/ open                           Wound or open sores. Avoid contact with eyes, ears mouth and genitals (private parts).                       Wash face,  Genitals (private parts) with your normal soap.             6.  Wash thoroughly, paying special attention to the area where your surgery  will be performed.  7.  Thoroughly rinse your body with warm water from the neck down.  8.  DO NOT shower/wash with your normal soap after using and rinsing off  the CHG Soap.                9.  Pat yourself dry with a clean towel.            10.  Wear clean pajamas.            11.  Place clean sheets on your bed the night of your first shower and do not  sleep with pets. Day of Surgery : Do not apply any lotions/deodorants the morning of surgery.  Please wear clean clothes to the hospital/surgery center.  FAILURE TO FOLLOW THESE INSTRUCTIONS MAY RESULT IN THE CANCELLATION OF YOUR SURGERY PATIENT SIGNATURE_________________________________  NURSE SIGNATURE__________________________________  ________________________________________________________________________

## 2015-06-30 ENCOUNTER — Encounter (HOSPITAL_COMMUNITY)
Admission: RE | Admit: 2015-06-30 | Discharge: 2015-06-30 | Disposition: A | Discharge status: Home / Self Care | Payer: Commercial Managed Care - HMO | Source: Ambulatory Visit | Attending: General Surgery | Admitting: General Surgery

## 2015-06-30 ENCOUNTER — Encounter (HOSPITAL_COMMUNITY): Payer: Self-pay

## 2015-06-30 ENCOUNTER — Encounter (INDEPENDENT_AMBULATORY_CARE_PROVIDER_SITE_OTHER): Payer: Self-pay

## 2015-06-30 DIAGNOSIS — Z01818 Encounter for other preprocedural examination: Secondary | ICD-10-CM | POA: Insufficient documentation

## 2015-06-30 DIAGNOSIS — C20 Malignant neoplasm of rectum: Secondary | ICD-10-CM | POA: Insufficient documentation

## 2015-06-30 HISTORY — DX: Depression, unspecified: F32.A

## 2015-06-30 HISTORY — DX: Major depressive disorder, single episode, unspecified: F32.9

## 2015-06-30 HISTORY — DX: Unspecified osteoarthritis, unspecified site: M19.90

## 2015-06-30 LAB — CBC
HCT: 41.2 % (ref 39.0–52.0)
Hemoglobin: 13.5 g/dL (ref 13.0–17.0)
MCH: 29.1 pg (ref 26.0–34.0)
MCHC: 32.8 g/dL (ref 30.0–36.0)
MCV: 88.8 fL (ref 78.0–100.0)
PLATELETS: 143 10*3/uL — AB (ref 150–400)
RBC: 4.64 MIL/uL (ref 4.22–5.81)
RDW: 12.5 % (ref 11.5–15.5)
WBC: 3.8 10*3/uL — AB (ref 4.0–10.5)

## 2015-06-30 LAB — BASIC METABOLIC PANEL
Anion gap: 7 (ref 5–15)
BUN: 10 mg/dL (ref 6–20)
CALCIUM: 9.6 mg/dL (ref 8.9–10.3)
CO2: 25 mmol/L (ref 22–32)
Chloride: 106 mmol/L (ref 101–111)
Creatinine, Ser: 1.07 mg/dL (ref 0.61–1.24)
Glucose, Bld: 122 mg/dL — ABNORMAL HIGH (ref 65–99)
Potassium: 4.1 mmol/L (ref 3.5–5.1)
SODIUM: 138 mmol/L (ref 135–145)

## 2015-06-30 LAB — ABO/RH: ABO/RH(D): B POS

## 2015-06-30 NOTE — Consult Note (Signed)
WOC ostomy consult note Patient seen per Dr. [name]' request for preoperative stoma site selection and marking.  Patient is requested to relay the type of surgery that he is having next week in his own words; has a very poor understanding of procedure despite repeated attempts to educate him on this.  Sample pouches are shown to him (both a one-piece and a two-piece) and illustrations shown from the educational booklet in the preop kit.  Vague understanding noted.  Patient is asking if he will be going home from the hospital on the day of surgery and if he can drive that day.  I explain that he will be staying a day or so in the hospital and that typically people do not drive themselves home from the hospital, but that he should be directing these types of questions to Dr. [name]' office. Additionally, he is asking why he must come for the tatooing on Tuesday and for surgery on Wednesday and for a specific definition of "temporary" for having to wear an ostomy pouching system. Again, he is referred to Dr. Manon Hilding office for the answers to his questions. Patient's abdomen is assessed in the standing and sitting positions, leaning forward and moving.  Two sites are selected:  A RLQ site is located 6.5cm to the right of the umbilicus and 5.7MB below and the LLQ site is located 6cm to the left of the umbilicus and 1cm below.  Both sites are marked with a surgical site marking pen and are covered with a thin film transparent dressing. Patient is released and taken to doorway leading out to parking lot and reappears several minutes later to inquire if his surgery is going to be in this hospital.  I suspect both anxiety related to the events leading up to this surgery and an mild cognitive impairment is interrupting the processing of information today. Thank you for this consultation.  The [place] nursing team will follow, and will remain available to this patient, the nursing, surgical and medical teams.    Thanks, Maudie Flakes, MSN, RN, [place], [place], [place] [phone])

## 2015-06-30 NOTE — Progress Notes (Signed)
06-24-15 - CT Chest W Contrast - EPIC 06-08-15 - CT ABD/Pelvis W Contrast - EPIC 06-02-15 - LOV - Dr. Whitney Muse (onc.) - EPIC 05-26-15 - Colonoscopy - EPIC 03-29-15 - EKG - EPIC 03-24-15 - LOV - [place] Field, NP (gastro) - EPIC

## 2015-07-01 LAB — HEMOGLOBIN A1C
HEMOGLOBIN A1C: 6.3 % — AB (ref 4.8–5.6)
Mean Plasma Glucose: 134 mg/dL

## 2015-07-04 ENCOUNTER — Ambulatory Visit (HOSPITAL_COMMUNITY): Payer: Commercial Managed Care - HMO | Admitting: Hematology & Oncology

## 2015-07-04 NOTE — Progress Notes (Signed)
07-04-15 - Faxed HGA1C lab results from preop visit on 3-79-02 to Dr. [name] via [facility]

## 2015-07-05 ENCOUNTER — Encounter (HOSPITAL_COMMUNITY): Payer: Self-pay

## 2015-07-05 ENCOUNTER — Ambulatory Visit (HOSPITAL_COMMUNITY)
Admission: RE | Admit: 2015-07-05 | Discharge: 2015-07-05 | Disposition: A | Discharge status: Home / Self Care | Payer: Commercial Managed Care - HMO | Source: Ambulatory Visit | Attending: General Surgery | Admitting: General Surgery

## 2015-07-05 ENCOUNTER — Encounter (HOSPITAL_COMMUNITY)
Admission: RE | Disposition: A | Discharge status: Home / Self Care | Payer: Self-pay | Source: Ambulatory Visit | Attending: General Surgery

## 2015-07-05 DIAGNOSIS — G4730 Sleep apnea, unspecified: Secondary | ICD-10-CM | POA: Diagnosis present

## 2015-07-05 DIAGNOSIS — Z79899 Other long term (current) drug therapy: Secondary | ICD-10-CM | POA: Diagnosis not present

## 2015-07-05 DIAGNOSIS — I10 Essential (primary) hypertension: Secondary | ICD-10-CM | POA: Insufficient documentation

## 2015-07-05 DIAGNOSIS — E119 Type 2 diabetes mellitus without complications: Secondary | ICD-10-CM

## 2015-07-05 DIAGNOSIS — M549 Dorsalgia, unspecified: Secondary | ICD-10-CM | POA: Diagnosis present

## 2015-07-05 DIAGNOSIS — Z833 Family history of diabetes mellitus: Secondary | ICD-10-CM | POA: Diagnosis not present

## 2015-07-05 DIAGNOSIS — C20 Malignant neoplasm of rectum: Secondary | ICD-10-CM | POA: Insufficient documentation

## 2015-07-05 DIAGNOSIS — M1990 Unspecified osteoarthritis, unspecified site: Secondary | ICD-10-CM | POA: Diagnosis present

## 2015-07-05 DIAGNOSIS — E669 Obesity, unspecified: Secondary | ICD-10-CM | POA: Diagnosis present

## 2015-07-05 DIAGNOSIS — C19 Malignant neoplasm of rectosigmoid junction: Secondary | ICD-10-CM | POA: Diagnosis present

## 2015-07-05 DIAGNOSIS — Z6838 Body mass index (BMI) 38.0-38.9, adult: Secondary | ICD-10-CM | POA: Diagnosis not present

## 2015-07-05 DIAGNOSIS — Z8249 Family history of ischemic heart disease and other diseases of the circulatory system: Secondary | ICD-10-CM | POA: Diagnosis not present

## 2015-07-05 HISTORY — PX: FLEXIBLE SIGMOIDOSCOPY: SHX5431

## 2015-07-05 SURGERY — SIGMOIDOSCOPY, FLEXIBLE
Anesthesia: Moderate Sedation

## 2015-07-05 MED ORDER — FENTANYL CITRATE (PF) 100 MCG/2ML IJ SOLN
INTRAMUSCULAR | Status: AC
  Filled 2015-07-05: qty 2

## 2015-07-05 MED ORDER — MIDAZOLAM HCL 10 MG/2ML IJ SOLN
INTRAMUSCULAR | Status: DC | PRN
  Administered 2015-07-05: 2 mg via INTRAVENOUS
  Administered 2015-07-05: 1 mg via INTRAVENOUS

## 2015-07-05 MED ORDER — MIDAZOLAM HCL 5 MG/ML IJ SOLN
INTRAMUSCULAR | Status: AC
  Filled 2015-07-05: qty 2

## 2015-07-05 MED ORDER — SPOT INK MARKER SYRINGE KIT
PACK | SUBMUCOSAL | Status: DC | PRN
  Administered 2015-07-05: 5 mL via SUBMUCOSAL

## 2015-07-05 MED ORDER — FENTANYL CITRATE (PF) 100 MCG/2ML IJ SOLN
INTRAMUSCULAR | Status: DC | PRN
  Administered 2015-07-05 (×2): 25 ug via INTRAVENOUS

## 2015-07-05 MED ORDER — DIPHENHYDRAMINE HCL 50 MG/ML IJ SOLN
INTRAMUSCULAR | Status: AC
  Filled 2015-07-05: qty 1

## 2015-07-05 MED ORDER — SPOT INK MARKER SYRINGE KIT
PACK | SUBMUCOSAL | Status: AC
  Filled 2015-07-05: qty 5

## 2015-07-05 NOTE — Interval H&P Note (Signed)
History and Physical Interval Note:  07/05/2015 1:34 PM  [name]  has presented today for surgery, with the diagnosis of * Rectal Cancer*  The various methods of treatment have been discussed with the patient and family. After consideration of risks, benefits and other options for treatment, the patient has consented to  Procedure(s) with comments: FLEXIBLE SIGMOIDOSCOPY (N/A) - with tattoo as a surgical intervention .  The patient's history has been reviewed, patient examined, no change in status, stable for surgery.  I have reviewed the patient's chart and labs.  Questions were answered to the patient's satisfaction.     Rosario Adie, MD  Colorectal and [facility]

## 2015-07-05 NOTE — H&P (View-Only) (Signed)
[name] 06/14/2015 9:48 AM Location: [facility] Patient #: [id number] DOB: [birth year] Divorced / Language: [name] / Race: Black or African American Male History of Present Illness [name] MD; 03/27/3266 10:52 AM) Patient words: rectal cancer.  The patient is a 54 year old male who presents with colorectal cancer. This is a 54 year old male who presents to the office with a newly diagnosed rectal cancer. This was noted by Dr. Gala Romney on screening colonoscopy. This was noted to be approximately 5 cm from the anal verge on colonoscopy. CT scan of the abdomen shows no signs of metastatic disease. There is some rectal thickening at the level of the coccyx. CEA is normal. Rectal ultrasound shows a T1/T2 lesion with no lymph node involvement noted. The patient is asymptomatic. He denies any rectal bleeding. He has regular bowel movements. Other Problems [name], LPN; 12/13/5807 9:83 AM) Arthritis Back Pain Diabetes Mellitus High blood pressure Rectal Cancer Sleep Apnea  Past Surgical History [name], LPN; 3/82/5053 9:76 AM) Gallbladder Surgery - Open Spinal Surgery - Lower Back Spinal Surgery Midback  Diagnostic Studies History [name], LPN; 7/34/1937 9:02 AM) Colonoscopy never  Allergies [name], LPN; 02/26/7352 2:99 AM) No Known Drug Allergies 06/14/2015  Medication History [name], LPN; 2/42/6834 1:96 AM) AmLODIPine Besylate (5MG  Tablet, Oral) Active. Gabapentin (800MG  Tablet, Oral) Active. Lisinopril (40MG  Tablet, Oral) Active. Nortriptyline HCl (75MG  Capsule, Oral) Active. TiZANidine HCl (2MG  Tablet, Oral) Active. Medications Reconciled  Social History [name], LPN; 01/10/9797 9:21 AM) Alcohol use Remotely quit alcohol use. Caffeine use Coffee, Tea. Illicit drug use Remotely quit drug use. Tobacco use Never smoker.  Family History [name], LPN; 1/94/1740 8:14 AM) Arthritis  Father. Diabetes Mellitus Father. Hypertension Father.     Review of Systems [name] LPN; 4/81/8563 1:49 AM) General Not Present- Appetite Loss, Chills, Fatigue, Fever, Night Sweats, Weight Gain and Weight Loss. Skin Not Present- Change in Wart/Mole, Dryness, Hives, Jaundice, New Lesions, Non-Healing Wounds, Rash and Ulcer. HEENT Present- Wears glasses/contact lenses. Not Present- Earache, Hearing Loss, Hoarseness, Nose Bleed, Oral Ulcers, Ringing in the Ears, Seasonal Allergies, Sinus Pain, Sore Throat, Visual Disturbances and Yellow Eyes. Respiratory Not Present- Bloody sputum, Chronic Cough, Difficulty Breathing, Snoring and Wheezing. Breast Not Present- Breast Mass, Breast Pain, Nipple Discharge and Skin Changes. Cardiovascular Not Present- Chest Pain, Difficulty Breathing Lying Down, Leg Cramps, Palpitations, Rapid Heart Rate, Shortness of Breath and Swelling of Extremities. Gastrointestinal Not Present- Abdominal Pain, Bloating, Bloody Stool, Change in Bowel Habits, Chronic diarrhea, Constipation, Difficulty Swallowing, Excessive gas, Gets full quickly at meals, Hemorrhoids, Indigestion, Nausea, Rectal Pain and Vomiting. Male Genitourinary Not Present- Blood in Urine, Change in Urinary Stream, Frequency, Impotence, Nocturia, Painful Urination, Urgency and Urine Leakage.  Vitals [name] LPN; 05/20/6377 5:88 AM) 06/14/2015 9:53 AM Weight: 214.6 lb Height: 63in Body Surface Area: 2.08 m Body Mass Index: 38.01 kg/m Temp.: 61F(Oral)  Pulse: 104 (Regular)  BP: 128/82 (Sitting, Left Arm, Standard)     Physical Exam [name] MD; 03/20/7740 12:40 PM)  General Mental Status-Alert. General Appearance-Consistent with stated age. Hydration-Well hydrated. Voice-Normal.  Head and Neck Head-normocephalic, atraumatic with no lesions or palpable masses. Trachea-midline. Thyroid Gland Characteristics - normal size and  consistency.  Eye Eyeball - Bilateral-Extraocular movements intact. Sclera/Conjunctiva - Bilateral-No scleral icterus.  Chest and Lung Exam Chest and lung exam reveals -quiet, even and easy respiratory effort with no use of accessory muscles and on auscultation, normal breath sounds, no adventitious sounds and normal vocal resonance.  Inspection Chest Wall - Normal. Back - normal.  Cardiovascular Cardiovascular examination reveals -normal heart sounds, regular rate and rhythm with no murmurs and normal pedal pulses bilaterally.  Abdomen Inspection Inspection of the abdomen reveals - No Hernias. Palpation/Percussion Palpation and Percussion of the abdomen reveal - Soft, Non Tender, No Rebound tenderness, No Rigidity (guarding) and No hepatosplenomegaly. Auscultation Auscultation of the abdomen reveals - Bowel sounds normal.  Rectal Anorectal Exam External - normal external exam. Internal - Note: No mass palpated.  Neurologic Neurologic evaluation reveals -alert and oriented x 3 with no impairment of recent or remote memory. Mental Status-Normal.  Musculoskeletal Global Assessment -Note:no gross deformities.  Normal Exam - Left-Upper Extremity Strength Normal and Lower Extremity Strength Normal. Normal Exam - Right-Upper Extremity Strength Normal and Lower Extremity Strength Normal.    Assessment & Plan [name] MD; 03/04/6062 12:42 PM)  PRIMARY CANCER OF RECTUM (154.1  C20) Impression: 54 year old male who was found to have a rectal mass on screening colonoscopy. 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 CT scan of the abdomen showed no signs of metastatic disease and CEA was normal. Rectal ultrasound showed a T1/T2 N0 lesion. This does not appear that it was tattooed. I have recommended the patient undergo upfront low anterior resection with possible diverting ileostomy. We discussed this in detail. We discussed that we would like to get a CT  scan of his chest to complete his metastatic workup. I will also plan to do a flexible proctoscopy for purpose of tattooing the lesion the day before his surgery.  The surgery and anatomy were described to the patient as well as the risks of surgery and the possible complications. These include: Bleeding, deep abdominal infections and possible wound complications such as hernia and infection, damage to adjacent structures, leak of surgical connections, which can lead to other surgeries and possibly an ostomy, possible need for other procedures, such as abscess drains in radiology, possible prolonged hospital stay, possible diarrhea from removal of part of the colon, possible constipation from narcotics, possible bowel, bladder or sexual dysfunction if having rectal surgery, prolonged fatigue/weakness or appetite loss, possible early recurrence of of disease, possible complications of their medical problems such as heart disease or arrhythmias or lung problems, death (less than 1%). I believe the patient understands and wishes to proceed with the surgery.

## 2015-07-05 NOTE — Op Note (Signed)
[facility] [place], [address]   FLEXIBLE SIGMOIDOSCOPY PROCEDURE REPORT  PATIENT: [name], [name]  MR#: [id number] BIRTHDATE: 09-Jun-1961 , 79  yrs. old GENDER: male ENDOSCOPIST: Rosario Adie, MD REFERRED BY: Ancil Linsey, MD PROCEDURE DATE:  07/05/2015 PROCEDURE:   Sigmoidoscopy, diagnostic ASA CLASS:   Class II INDICATIONS:marking a neoplasm for localization.   rectal cancer for tattoo. MEDICATIONS: Versed 3 mg IV and Fentanyl 50 mcg IV  DESCRIPTION OF PROCEDURE:   After the risks benefits and alternatives of the procedure were thoroughly explained, informed consent was obtained.  Digital exam revealed no abnormalities of the rectum. The     endoscope was introduced through the anus  and advanced to the sigmoid colon , The exam was Without limitations. The quality of the prep was The overall prep quality was excellent. . Estimated blood loss is zero unless otherwise noted in this procedure report. The instrument was then slowly withdrawn as the mucosa was fully examined.         COLON FINDINGS: A one-quarter circumferential firm mass, measuring 3 X 3cm in size, was found in the rectum.  Injection (tattooing) was performed.    Retroflexion was not performed due to a narrow rectal vault.    The scope was then withdrawn from the patient and the procedure terminated.  COMPLICATIONS: There were no immediate complications.  ENDOSCOPIC IMPRESSION: One-quarter circumferential rectal cancer, measuring 3 X 3cm in size, was found in the rectum at the first valve of [place]  3-4 cm from anal verge; Injection (tattooing) was performed in all 4 quadrants just distal to the lesion.  RECOMMENDATIONS: Surgery  REPEAT EXAM:  eSigned:  Rosario Adie, MD 90/30/0923 2:22 PM   CC:

## 2015-07-05 NOTE — Discharge Instructions (Addendum)
Post Colonoscopy Instructions  1. DIET: Only drink clear liquids until midnight, then nothing to eat or drink afterwards.  2. You may have some mild rectal bleeding for the first few days after the procedure.  This should get less and less with time.  Resume any blood thinners 2 days after your procedure unless directed otherwise by your physician. 3. Take your usually prescribed home medications unless otherwise directed. a. If you have any pain, it is helpful to get up and walk around, as it is usually from excess gas. b. If this is not helpful, you can take an over-the-counter pain medication.  Choose one of the following that works best for you: i. Naproxen (Aleve, etc)  Two 220mg  tabs twice a day ii. Ibuprofen (Advil, etc) Three 200mg  tabs four times a day (every meal & bedtime) iii. If you still have pain after using one of these, please call the office 4. It is normal to not have a bowel movement for 2-3 days after colonoscopy.    5. ACTIVITIES as tolerated:   6. You may resume regular (light) daily activities beginning the next day--such as daily self-care, walking, climbing stairs--gradually increasing activities as tolerated.    WHEN TO CALL us [phone]: 1. Fever over 101.5 F (38.5 C)  2. Severe abdominal or chest pain  3. Large amount of rectal bleeding, passing multiple blood clots  4. Dizziness or shortness of breath 5. Increasing nausea or vomiting   The clinic staff is available to answer your questions during regular business hours (8:30am-5pm).  Please dont hesitate to call and ask to speak to one of our nurses for clinical concerns.   If you have a medical emergency, go to the nearest emergency room or call 911.  A surgeon from [facility] is always on call at the [facility], [place], [place], [place], [place]  [address] ? MAIN: [phone] ? TOLL FREE: [phone] ?  FAX (336)  [phone] [email or web address]  Flexible Sigmoidoscopy, Care After Refer to this sheet in the next few weeks. These instructions provide you with information on caring for yourself after your procedure. Your health care provider may also give you more specific instructions. Your treatment has been planned according to current medical practices, but problems sometimes occur. Call your health care provider if you have any problems or questions after your procedure. WHAT TO EXPECT AFTER THE PROCEDURE After your procedure, it is typical to have the following:   Abdominal cramps.  Bloating.  A small amount of rectal bleeding if you had a biopsy. HOME CARE INSTRUCTIONS  Only take over-the-counter or prescription medicines for pain, fever, or discomfort as directed by your health care provider.  Resume your normal diet and activities as directed by your health care provider. SEEK MEDICAL CARE IF:  You have abdominal pain or cramping that lasts longer than 1 hour after the procedure.  You continue to have small amounts of rectal bleeding after 24 hours.  You have nausea or vomiting.  You feel weak or dizzy. SEEK IMMEDIATE MEDICAL CARE IF:   You have a fever.  You pass large blood clots or see a large amount of blood in the toilet after having a bowel movement. This may also occur 10-14 days after the procedure. It is more likely if you had a biopsy.  You develop abdominal pain that is not relieved with medicine or your abdominal pain gets worse.  You have nausea or  vomiting for more than 24 hours after the procedure. Document Released: 11/10/2013 Document Reviewed: 11/10/2013 [facility] Patient Information 2015 [place], [place]. This information is not intended to replace advice given to you by your health care provider. Make sure you discuss any questions you have with your health care provider.

## 2015-07-06 ENCOUNTER — Encounter (HOSPITAL_COMMUNITY)
Admission: AD | Disposition: A | Discharge status: Home / Self Care | Payer: Self-pay | Source: Ambulatory Visit | Attending: General Surgery

## 2015-07-06 ENCOUNTER — Encounter (HOSPITAL_COMMUNITY): Payer: Self-pay | Admitting: *Deleted

## 2015-07-06 ENCOUNTER — Inpatient Hospital Stay (HOSPITAL_COMMUNITY): Payer: Commercial Managed Care - HMO | Admitting: Anesthesiology

## 2015-07-06 ENCOUNTER — Inpatient Hospital Stay (HOSPITAL_COMMUNITY)
Admission: AD | Admit: 2015-07-06 | Discharge: 2015-07-11 | DRG: 331 | Disposition: A | Discharge status: Home / Self Care | Payer: Commercial Managed Care - HMO | Source: Ambulatory Visit | Attending: General Surgery | Admitting: General Surgery

## 2015-07-06 DIAGNOSIS — C20 Malignant neoplasm of rectum: Secondary | ICD-10-CM | POA: Diagnosis present

## 2015-07-06 DIAGNOSIS — E119 Type 2 diabetes mellitus without complications: Secondary | ICD-10-CM | POA: Diagnosis present

## 2015-07-06 DIAGNOSIS — Z8249 Family history of ischemic heart disease and other diseases of the circulatory system: Secondary | ICD-10-CM

## 2015-07-06 DIAGNOSIS — M549 Dorsalgia, unspecified: Secondary | ICD-10-CM | POA: Diagnosis present

## 2015-07-06 DIAGNOSIS — Z833 Family history of diabetes mellitus: Secondary | ICD-10-CM | POA: Diagnosis not present

## 2015-07-06 DIAGNOSIS — E669 Obesity, unspecified: Secondary | ICD-10-CM | POA: Diagnosis present

## 2015-07-06 DIAGNOSIS — Z6838 Body mass index (BMI) 38.0-38.9, adult: Secondary | ICD-10-CM | POA: Diagnosis not present

## 2015-07-06 DIAGNOSIS — C19 Malignant neoplasm of rectosigmoid junction: Principal | ICD-10-CM | POA: Diagnosis present

## 2015-07-06 DIAGNOSIS — G4730 Sleep apnea, unspecified: Secondary | ICD-10-CM | POA: Diagnosis present

## 2015-07-06 DIAGNOSIS — M1990 Unspecified osteoarthritis, unspecified site: Secondary | ICD-10-CM | POA: Diagnosis present

## 2015-07-06 DIAGNOSIS — Z79899 Other long term (current) drug therapy: Secondary | ICD-10-CM

## 2015-07-06 HISTORY — PX: XI ROBOTIC ASSISTED LOWER ANTERIOR RESECTION: SHX6558

## 2015-07-06 LAB — TYPE AND SCREEN
ABO/RH(D): B POS
Antibody Screen: NEGATIVE

## 2015-07-06 LAB — GLUCOSE, CAPILLARY
GLUCOSE-CAPILLARY: 78 mg/dL (ref 65–99)
GLUCOSE-CAPILLARY: 116 mg/dL — AB (ref 65–99)

## 2015-07-06 SURGERY — RESECTION, RECTUM, LOW ANTERIOR, ROBOT-ASSISTED
Anesthesia: General

## 2015-07-06 MED ORDER — 0.9 % SODIUM CHLORIDE (POUR BTL) OPTIME
TOPICAL | Status: DC | PRN
  Administered 2015-07-06: 2000 mL

## 2015-07-06 MED ORDER — LIDOCAINE HCL (CARDIAC) 20 MG/ML IV SOLN
INTRAVENOUS | Status: DC | PRN
  Administered 2015-07-06: 100 mg via INTRAVENOUS

## 2015-07-06 MED ORDER — CHLORHEXIDINE GLUCONATE 0.12 % MT SOLN
15.0000 mL | Freq: Two times a day (BID) | OROMUCOSAL | Status: DC
  Administered 2015-07-06 – 2015-07-07 (×3): 15 mL via OROMUCOSAL
  Filled 2015-07-06 (×5): qty 15

## 2015-07-06 MED ORDER — FENTANYL CITRATE (PF) 250 MCG/5ML IJ SOLN
INTRAMUSCULAR | Status: AC
  Filled 2015-07-06: qty 25

## 2015-07-06 MED ORDER — LABETALOL HCL 5 MG/ML IV SOLN
INTRAVENOUS | Status: DC | PRN
  Administered 2015-07-06: 5 mg via INTRAVENOUS

## 2015-07-06 MED ORDER — HYDROMORPHONE HCL 2 MG/ML IJ SOLN
INTRAMUSCULAR | Status: AC
  Filled 2015-07-06: qty 1

## 2015-07-06 MED ORDER — LISINOPRIL 40 MG PO TABS
40.0000 mg | ORAL_TABLET | Freq: Every morning | ORAL | Status: DC
  Administered 2015-07-07 – 2015-07-11 (×5): 40 mg via ORAL
  Filled 2015-07-06 (×5): qty 1

## 2015-07-06 MED ORDER — PROPOFOL 10 MG/ML IV BOLUS
INTRAVENOUS | Status: AC
  Filled 2015-07-06: qty 20

## 2015-07-06 MED ORDER — EPHEDRINE SULFATE 50 MG/ML IJ SOLN
INTRAMUSCULAR | Status: AC
  Filled 2015-07-06: qty 1

## 2015-07-06 MED ORDER — GABAPENTIN 400 MG PO CAPS
800.0000 mg | ORAL_CAPSULE | Freq: Three times a day (TID) | ORAL | Status: DC
  Administered 2015-07-06 – 2015-07-11 (×15): 800 mg via ORAL
  Filled 2015-07-06 (×17): qty 2

## 2015-07-06 MED ORDER — LACTATED RINGERS IR SOLN
Status: DC | PRN
  Administered 2015-07-06: 1

## 2015-07-06 MED ORDER — PROMETHAZINE HCL 25 MG/ML IJ SOLN: 6.2500 mg | INTRAMUSCULAR | Status: DC | PRN

## 2015-07-06 MED ORDER — HYDROMORPHONE HCL 1 MG/ML IJ SOLN: 0.2500 mg | INTRAMUSCULAR | Status: DC | PRN

## 2015-07-06 MED ORDER — CEFOTETAN DISODIUM-DEXTROSE 2-2.08 GM-% IV SOLR
INTRAVENOUS | Status: AC
  Filled 2015-07-06: qty 50

## 2015-07-06 MED ORDER — LIDOCAINE HCL (CARDIAC) 20 MG/ML IV SOLN
INTRAVENOUS | Status: AC
  Filled 2015-07-06: qty 5

## 2015-07-06 MED ORDER — NORTRIPTYLINE HCL 25 MG PO CAPS
75.0000 mg | ORAL_CAPSULE | Freq: Every day | ORAL | Status: DC
  Administered 2015-07-06 – 2015-07-10 (×5): 75 mg via ORAL
  Filled 2015-07-06 (×6): qty 3

## 2015-07-06 MED ORDER — ROCURONIUM BROMIDE 100 MG/10ML IV SOLN
INTRAVENOUS | Status: DC | PRN
  Administered 2015-07-06 (×2): 20 mg via INTRAVENOUS
  Administered 2015-07-06: 60 mg via INTRAVENOUS
  Administered 2015-07-06 (×3): 20 mg via INTRAVENOUS
  Administered 2015-07-06: 10 mg via INTRAVENOUS

## 2015-07-06 MED ORDER — MIDAZOLAM HCL 5 MG/5ML IJ SOLN
INTRAMUSCULAR | Status: DC | PRN
  Administered 2015-07-06: 2 mg via INTRAVENOUS

## 2015-07-06 MED ORDER — CEFOTETAN DISODIUM 2 G IJ SOLR
2.0000 g | INTRAMUSCULAR | Status: AC
  Administered 2015-07-06: 2 g via INTRAVENOUS
  Filled 2015-07-06: qty 2

## 2015-07-06 MED ORDER — FENTANYL CITRATE (PF) 100 MCG/2ML IJ SOLN
INTRAMUSCULAR | Status: DC | PRN
  Administered 2015-07-06: 100 ug via INTRAVENOUS
  Administered 2015-07-06 (×2): 50 ug via INTRAVENOUS
  Administered 2015-07-06 (×3): 100 ug via INTRAVENOUS

## 2015-07-06 MED ORDER — CETYLPYRIDINIUM CHLORIDE 0.05 % MT LIQD
7.0000 mL | Freq: Two times a day (BID) | OROMUCOSAL | Status: DC
  Administered 2015-07-06 – 2015-07-07 (×3): 7 mL via OROMUCOSAL

## 2015-07-06 MED ORDER — SODIUM CHLORIDE 0.9 % IJ SOLN
INTRAMUSCULAR | Status: AC
  Filled 2015-07-06: qty 10

## 2015-07-06 MED ORDER — LACTATED RINGERS IV SOLN
INTRAVENOUS | Status: DC | PRN
  Administered 2015-07-06 (×3): via INTRAVENOUS

## 2015-07-06 MED ORDER — ONDANSETRON HCL 4 MG/2ML IJ SOLN
INTRAMUSCULAR | Status: DC | PRN
  Administered 2015-07-06: 4 mg via INTRAVENOUS

## 2015-07-06 MED ORDER — DEXTROSE 5 % IV SOLN
2.0000 g | Freq: Two times a day (BID) | INTRAVENOUS | Status: AC
  Administered 2015-07-06: 2 g via INTRAVENOUS
  Filled 2015-07-06: qty 2

## 2015-07-06 MED ORDER — ENOXAPARIN SODIUM 40 MG/0.4ML ~~LOC~~ SOLN
40.0000 mg | SUBCUTANEOUS | Status: DC
  Administered 2015-07-07 – 2015-07-11 (×5): 40 mg via SUBCUTANEOUS
  Filled 2015-07-06 (×7): qty 0.4

## 2015-07-06 MED ORDER — LACTATED RINGERS IV SOLN
INTRAVENOUS | Status: DC
  Administered 2015-07-06 – 2015-07-07 (×2): via INTRAVENOUS

## 2015-07-06 MED ORDER — NEOSTIGMINE METHYLSULFATE 10 MG/10ML IV SOLN
INTRAVENOUS | Status: DC | PRN
  Administered 2015-07-06: 5 mg via INTRAVENOUS

## 2015-07-06 MED ORDER — ONDANSETRON HCL 4 MG/2ML IJ SOLN
INTRAMUSCULAR | Status: AC
  Filled 2015-07-06: qty 2

## 2015-07-06 MED ORDER — TIZANIDINE HCL 2 MG PO TABS
2.0000 mg | ORAL_TABLET | Freq: Three times a day (TID) | ORAL | Status: DC | PRN
  Administered 2015-07-06 – 2015-07-08 (×3): 2 mg via ORAL
  Filled 2015-07-06 (×5): qty 1

## 2015-07-06 MED ORDER — HYDRALAZINE HCL 20 MG/ML IJ SOLN
INTRAMUSCULAR | Status: AC
  Filled 2015-07-06: qty 1

## 2015-07-06 MED ORDER — MIDAZOLAM HCL 2 MG/2ML IJ SOLN
INTRAMUSCULAR | Status: AC
  Filled 2015-07-06: qty 4

## 2015-07-06 MED ORDER — DIPHENHYDRAMINE HCL 50 MG/ML IJ SOLN: 25.0000 mg | Freq: Four times a day (QID) | INTRAMUSCULAR | Status: DC | PRN

## 2015-07-06 MED ORDER — ONDANSETRON HCL 4 MG/2ML IJ SOLN
4.0000 mg | Freq: Four times a day (QID) | INTRAMUSCULAR | Status: DC | PRN
  Administered 2015-07-07: 4 mg via INTRAVENOUS
  Filled 2015-07-06: qty 2

## 2015-07-06 MED ORDER — AMLODIPINE BESYLATE 5 MG PO TABS
5.0000 mg | ORAL_TABLET | Freq: Every morning | ORAL | Status: DC
  Administered 2015-07-07 – 2015-07-11 (×5): 5 mg via ORAL
  Filled 2015-07-06 (×5): qty 1

## 2015-07-06 MED ORDER — ROCURONIUM BROMIDE 100 MG/10ML IV SOLN
INTRAVENOUS | Status: AC
  Filled 2015-07-06: qty 1

## 2015-07-06 MED ORDER — ALVIMOPAN 12 MG PO CAPS
12.0000 mg | ORAL_CAPSULE | Freq: Once | ORAL | Status: AC
  Administered 2015-07-06: 12 mg via ORAL
  Filled 2015-07-06: qty 1

## 2015-07-06 MED ORDER — HEPARIN SODIUM (PORCINE) 5000 UNIT/ML IJ SOLN
5000.0000 [IU] | Freq: Once | INTRAMUSCULAR | Status: AC
  Administered 2015-07-06: 5000 [IU] via SUBCUTANEOUS
  Filled 2015-07-06: qty 1

## 2015-07-06 MED ORDER — HYDRALAZINE HCL 20 MG/ML IJ SOLN
INTRAMUSCULAR | Status: DC | PRN
  Administered 2015-07-06 (×2): 4 mg via INTRAVENOUS

## 2015-07-06 MED ORDER — ACETAMINOPHEN 10 MG/ML IV SOLN
1000.0000 mg | Freq: Once | INTRAVENOUS | Status: AC
  Administered 2015-07-06: 1000 mg via INTRAVENOUS

## 2015-07-06 MED ORDER — BUPIVACAINE-EPINEPHRINE (PF) 0.25% -1:200000 IJ SOLN
INTRAMUSCULAR | Status: AC
  Filled 2015-07-06: qty 30

## 2015-07-06 MED ORDER — ALVIMOPAN 12 MG PO CAPS
12.0000 mg | ORAL_CAPSULE | Freq: Two times a day (BID) | ORAL | Status: DC
  Administered 2015-07-07 – 2015-07-08 (×4): 12 mg via ORAL
  Filled 2015-07-06 (×6): qty 1

## 2015-07-06 MED ORDER — PROPOFOL 10 MG/ML IV BOLUS
INTRAVENOUS | Status: DC | PRN
  Administered 2015-07-06: 200 mg via INTRAVENOUS

## 2015-07-06 MED ORDER — GLYCOPYRROLATE 0.2 MG/ML IJ SOLN
INTRAMUSCULAR | Status: DC | PRN
  Administered 2015-07-06: .7 mg via INTRAVENOUS

## 2015-07-06 MED ORDER — ACETAMINOPHEN 500 MG PO TABS
1000.0000 mg | ORAL_TABLET | Freq: Four times a day (QID) | ORAL | Status: AC
  Administered 2015-07-06 – 2015-07-07 (×4): 1000 mg via ORAL
  Filled 2015-07-06 (×6): qty 2

## 2015-07-06 MED ORDER — DIPHENHYDRAMINE HCL 25 MG PO CAPS: 25.0000 mg | ORAL_CAPSULE | Freq: Four times a day (QID) | ORAL | Status: DC | PRN

## 2015-07-06 MED ORDER — HYDROMORPHONE HCL 1 MG/ML IJ SOLN
INTRAMUSCULAR | Status: DC | PRN
  Administered 2015-07-06: .4 mg via INTRAVENOUS
  Administered 2015-07-06 (×2): .2 mg via INTRAVENOUS
  Administered 2015-07-06 (×2): .4 mg via INTRAVENOUS
  Administered 2015-07-06: 0.5 mg via INTRAVENOUS
  Administered 2015-07-06: .4 mg via INTRAVENOUS

## 2015-07-06 MED ORDER — ONDANSETRON HCL 4 MG PO TABS
4.0000 mg | ORAL_TABLET | Freq: Four times a day (QID) | ORAL | Status: DC | PRN
  Filled 2015-07-06: qty 1

## 2015-07-06 MED ORDER — MORPHINE SULFATE (PF) 2 MG/ML IV SOLN
2.0000 mg | INTRAVENOUS | Status: DC | PRN
  Administered 2015-07-06 – 2015-07-09 (×10): 2 mg via INTRAVENOUS
  Filled 2015-07-06 (×10): qty 1

## 2015-07-06 MED ORDER — BUPIVACAINE-EPINEPHRINE 0.25% -1:200000 IJ SOLN
INTRAMUSCULAR | Status: DC | PRN
Start: 2015-07-06 — End: 2015-07-06
  Administered 2015-07-06: 30 mL

## 2015-07-06 SURGICAL SUPPLY — 93 items
BLADE EXTENDED COATED 6.5IN (ELECTRODE) IMPLANT
CANNULA REDUC XI 12-8 STAPL (CANNULA)
CANNULA REDUC XI 12-8MM STAPL (CANNULA)
CANNULA REDUCER 12-8 DVNC XI (CANNULA) ×1 IMPLANT
CELLS DAT CNTRL 66122 CELL SVR (MISCELLANEOUS) ×1 IMPLANT
CLIP LIGATING HEM O LOK PURPLE (MISCELLANEOUS) IMPLANT
CLIP LIGATING HEMOLOK MED (MISCELLANEOUS) IMPLANT
COUNTER NEEDLE 20 DBL MAG RED (NEEDLE) ×3 IMPLANT
COVER MAYO STAND STRL (DRAPES) ×6 IMPLANT
COVER SURGICAL LIGHT HANDLE (MISCELLANEOUS) ×1 IMPLANT
COVER TIP SHEARS 8 DVNC (MISCELLANEOUS) ×1 IMPLANT
COVER TIP SHEARS 8MM DA VINCI (MISCELLANEOUS) ×2
DECANTER SPIKE VIAL GLASS SM (MISCELLANEOUS) ×3 IMPLANT
DEVICE TROCAR PUNCTURE CLOSURE (ENDOMECHANICALS) IMPLANT
DRAPE ARM DVNC X/XI (DISPOSABLE) ×4 IMPLANT
DRAPE COLUMN DVNC XI (DISPOSABLE) ×1 IMPLANT
DRAPE DA VINCI XI ARM (DISPOSABLE) ×8
DRAPE DA VINCI XI COLUMN (DISPOSABLE) ×2
DRAPE SURG IRRIG POUCH 19X23 (DRAPES) ×3 IMPLANT
DRSG OPSITE POSTOP 4X10 (GAUZE/BANDAGES/DRESSINGS) IMPLANT
DRSG OPSITE POSTOP 4X6 (GAUZE/BANDAGES/DRESSINGS) IMPLANT
DRSG OPSITE POSTOP 4X8 (GAUZE/BANDAGES/DRESSINGS) ×2 IMPLANT
ELECT PENCIL ROCKER SW 15FT (MISCELLANEOUS) ×6 IMPLANT
ELECT REM PT RETURN 15FT ADLT (MISCELLANEOUS) ×3 IMPLANT
ENDOLOOP SUT PDS II  0 18 (SUTURE)
ENDOLOOP SUT PDS II 0 18 (SUTURE) IMPLANT
EVACUATOR SILICONE 100CC (DRAIN) IMPLANT
GAUZE SPONGE 4X4 12PLY STRL (GAUZE/BANDAGES/DRESSINGS) ×2 IMPLANT
GLOVE BIO SURGEON STRL SZ 6.5 (GLOVE) ×6 IMPLANT
GLOVE BIO SURGEONS STRL SZ 6.5 (GLOVE) ×3
GLOVE BIOGEL PI IND STRL 7.0 (GLOVE) ×3 IMPLANT
GLOVE BIOGEL PI INDICATOR 7.0 (GLOVE) ×12
GOWN STRL REUS W/TWL 2XL LVL3 (GOWN DISPOSABLE) ×9 IMPLANT
GOWN STRL REUS W/TWL XL LVL3 (GOWN DISPOSABLE) ×12 IMPLANT
HOLDER FOLEY CATH W/STRAP (MISCELLANEOUS) ×3 IMPLANT
LEGGING LITHOTOMY PAIR STRL (DRAPES) ×3 IMPLANT
NDL INSUFFLATION 14GA 120MM (NEEDLE) ×1 IMPLANT
NEEDLE INSUFFLATION 14GA 120MM (NEEDLE) ×3 IMPLANT
PACK CARDIOVASCULAR III (CUSTOM PROCEDURE TRAY) ×3 IMPLANT
PACK COLON (CUSTOM PROCEDURE TRAY) ×3 IMPLANT
PEN SKIN MARKING BROAD (MISCELLANEOUS) ×1 IMPLANT
PORT LAP GEL ALEXIS MED 5-9CM (MISCELLANEOUS) IMPLANT
RELOAD STAPLE 45 BLU REG DVNC (STAPLE) IMPLANT
RELOAD STAPLE 45 GRN THCK DVNC (STAPLE) IMPLANT
RETRACTOR WND ALEXIS 18 MED (MISCELLANEOUS) IMPLANT
RTRCTR WOUND ALEXIS 18CM MED (MISCELLANEOUS) ×3
SCISSORS LAP 5X35 DISP (ENDOMECHANICALS) ×3 IMPLANT
SEAL CANN UNIV 5-8 DVNC XI (MISCELLANEOUS) ×3 IMPLANT
SEAL XI 5MM-8MM UNIVERSAL (MISCELLANEOUS) ×8
SEALER VESSEL DA VINCI XI (MISCELLANEOUS) ×2
SEALER VESSEL EXT DVNC XI (MISCELLANEOUS) ×1 IMPLANT
SET IRRIG TUBING LAPAROSCOPIC (IRRIGATION / IRRIGATOR) ×3 IMPLANT
SLEEVE XCEL OPT CAN 5 100 (ENDOMECHANICALS) IMPLANT
SOLUTION ELECTROLUBE (MISCELLANEOUS) ×3 IMPLANT
SPONGE LAP 18X18 X RAY DECT (DISPOSABLE) ×4 IMPLANT
STAPLER 45 BLU RELOAD XI (STAPLE) IMPLANT
STAPLER 45 BLUE RELOAD XI (STAPLE)
STAPLER 45 GREEN RELOAD XI (STAPLE)
STAPLER 45 GRN RELOAD XI (STAPLE) IMPLANT
STAPLER CANNULA SEAL DVNC XI (STAPLE) ×1 IMPLANT
STAPLER CANNULA SEAL XI (STAPLE)
STAPLER CUT CVD 40MM BLUE (STAPLE) ×4 IMPLANT
STAPLER CUT RELOAD GREEN (STAPLE) ×4 IMPLANT
STAPLER SHEATH (SHEATH)
STAPLER SHEATH ENDOWRIST DVNC (SHEATH) ×1 IMPLANT
STAPLER VISISTAT 35W (STAPLE) ×1 IMPLANT
SUT PDS AB 1 CTX 36 (SUTURE) IMPLANT
SUT PDS AB 1 TP1 96 (SUTURE) IMPLANT
SUT PROLENE 2 0 KS (SUTURE) ×3 IMPLANT
SUT SILK 2 0 (SUTURE) ×3
SUT SILK 2 0 SH CR/8 (SUTURE) ×3 IMPLANT
SUT SILK 2-0 18XBRD TIE 12 (SUTURE) ×1 IMPLANT
SUT SILK 3 0 (SUTURE) ×3
SUT SILK 3 0 SH CR/8 (SUTURE) ×3 IMPLANT
SUT SILK 3-0 18XBRD TIE 12 (SUTURE) ×1 IMPLANT
SUT VIC AB 2-0 SH 18 (SUTURE) ×3 IMPLANT
SUT VIC AB 2-0 SH 27 (SUTURE) ×3
SUT VIC AB 2-0 SH 27X BRD (SUTURE) ×1 IMPLANT
SUT VIC AB 3-0 SH 18 (SUTURE) IMPLANT
SUT VIC AB 4-0 PS2 27 (SUTURE) ×6 IMPLANT
SYRINGE 10CC LL (SYRINGE) ×3 IMPLANT
SYS LAPSCP GELPORT 120MM (MISCELLANEOUS)
SYSTEM LAPSCP GELPORT 120MM (MISCELLANEOUS) IMPLANT
TAPE CLOTH SURG 4X10 WHT LF (GAUZE/BANDAGES/DRESSINGS) ×2 IMPLANT
TOWEL OR 17X26 10 PK STRL BLUE (TOWEL DISPOSABLE) ×3 IMPLANT
TOWEL OR NON WOVEN STRL DISP B (DISPOSABLE) ×3 IMPLANT
TRAY FOLEY W/METER SILVER 14FR (SET/KITS/TRAYS/PACK) ×1 IMPLANT
TRAY FOLEY W/METER SILVER 16FR (SET/KITS/TRAYS/PACK) ×3 IMPLANT
TROCAR BLADELESS OPT 5 100 (ENDOMECHANICALS) ×3 IMPLANT
TROCAR XCEL 12X100 BLDLESS (ENDOMECHANICALS) ×2 IMPLANT
TUBING CONNECTING 10 (TUBING) IMPLANT
TUBING CONNECTING 10' (TUBING)
TUBING FILTER THERMOFLATOR (ELECTROSURGICAL) ×3 IMPLANT

## 2015-07-06 NOTE — Op Note (Signed)
07/06/2015  3:29 PM  PATIENT:  [name]  54 y.o. male  Patient Care Team: Rosita Fire, MD as PCP - General (Internal Medicine) Daneil Dolin, MD as Consulting Physician (Gastroenterology)  PRE-OPERATIVE DIAGNOSIS:  rectal cancer  POST-OPERATIVE DIAGNOSIS:  rectal cancer  PROCEDURE:  XI ROBOTIC ASSISTED LOWER ANTERIOR RESECTION, rigid proctoscopy  Surgeon(s): [name], MD Arta Bruce Kinsinger, MD  ASSISTANT: Dr Kieth Brightly   ANESTHESIA:   local and general  EBL: 366ml Total I/O In: 2800 [I.V.:2800] Out: 750 [Urine:375; Drains:75; Blood:300]  Delay start of Pharmacological VTE agent (>24hrs) due to surgical blood loss or risk of bleeding:  no  DRAINS: (57F) Jackson-Pratt drain(s) with closed bulb suction in the pelvis   SPECIMEN:  Source of Specimen:  Rectosigmoid, distal anastomotic ring  DISPOSITION OF SPECIMEN:  PATHOLOGY  COUNTS:  YES  PLAN OF CARE: Admit to inpatient   PATIENT DISPOSITION:  PACU - hemodynamically stable.  INDICATION:    54 y.o. M with rectal cancer found on colonoscopy.  Rectal US showed no signs of advanced disease.  I recommended a low anterior resection:  The anatomy & physiology of the digestive tract was discussed.  The pathophysiology was discussed.  Natural history risks without surgery was discussed.   I worked to give an overview of the disease and the frequent need to have multispecialty involvement.  I feel the risks of no intervention will lead to serious problems that outweigh the operative risks; therefore, I recommended a partial colectomy to remove the pathology.  Laparoscopic & open techniques were discussed.   Risks such as bleeding, infection, abscess, leak, reoperation, possible ostomy, hernia, heart attack, death, and other risks were discussed.  I noted a good likelihood this will help address the problem.   Goals of post-operative recovery were discussed as well.    The patient expressed understanding & wished to  proceed with surgery.  OR FINDINGS:   Patient had a right lateral mass in his mid rectum.  No obvious metastatic disease on visceral parietal peritoneum or liver.  The anastomosis rests ~8 cm from the anal verge by rigid proctoscopy.  DESCRIPTION:   Informed consent was confirmed.  The patient underwent general anaesthesia without difficulty.  The patient was positioned appropriately.  VTE prevention in place.  The patient's abdomen was clipped, prepped, & draped in a sterile fashion.  Surgical timeout confirmed our plan.  The patient was positioned in reverse Trendelenburg.  Abdominal entry was gained using a Varies needle in the LUQ.  Entry was clean.  I induced carbon dioxide insufflation.  I placed an 80mm robotic port in the LUQ.  Camera inspection revealed no injury at either site.  Extra ports were carefully placed under direct laparoscopic visualization.  A 42mm port was placed at the marked ileostomy site.     I reflected the greater omentum and the upper abdomen the small bowel in the upper abdomen. I scored the base of peritoneum of the right side of the mesentery of the left colon from the ligament of Treitz to the peritoneal reflection of the mid rectum.  The patient had tattoo located below the peritoneal reflection.  I elevated the sigmoid mesentery and enetered into the retro-mesenteric plane. We were able to identify the left ureter and gonadal vessels. We kept those posterior within the retroperitoneum and elevated the left colon mesentery off that. I did isolated IMA pedicle but did not ligate it yet.  I continued distally and got into the avascular plane  posterior to the mesorectum. This allowed me to help mobilize the rectum as well by freeing the mesorectum off the sacrum.  I mobilized the peritoneal coverings towards the peritoneal reflection on both the right and left sides of the rectum.  I could see the right and left ureters and stayed away from them. I mobilized the  posterior mesenteric plane using cautery and blunt dissection.  I then divided the lateral stalks.  I continued dissection of the peritoneal reflection anteriorly.  I bluntly mobilized the seminal vesicles and then prostate for the anterior rectal surface.  I continue down around the mesorectal plane until I was below the tattoo.  I could not thin the mesentery at this time because the peritoneum would not allow enough exposure into his very narrow pelvis.  I mobilized the left colon in a lateral to medial fashion off the line of Toldt up towards the splenic flexure to ensure good mobilization of the left colon to reach into the pelvis.     I skeletonized the inferior mesenteric artery pedicle.  I went down to its takeoff from the aorta.  After confirming the left ureter was out of the way, I went ahead and ligated the inferior mesenteric artery pedicle with bipolar robotic vessel sealer ~2cm above its takeoff from the aorta.  I did not ligate the inferior mesenteric vein.  We ensured hemostasis.  I then decided to perform the rest of the resection open.  I made a pfannenstiel incision ~2cm above the pubic bone with a 10 blade scalpel.  Dissection was carried into the subcutaneous tissues with cautery.  The fascia was incised horizontally and the peritoneum was incised vertically.  An alexis wound protector was placed.  The pelvis was so narrow, I could barley get one hand down to the tumor site.  I bluntly broke up some posterior adhesions.  I divided the sigmoid colon proximal to my vessel ligation with a blue load contour stapler.  I then used a green load contour stapler to divide the distal rectum just past the tumor site.  I was unable to get any more margin due to his pelvis size.  I then placed a 2-0 prolene purse string in the opened proximal colon and secured this with 3-0 silk sutures.  This was tied around the EEA anvil.  My partner then placed the EEA stapler into the distal rectal stump.  The spike  was advanced just above the staple line.  The anastomosis was created and tested with insufflation under water.  There were no leaks.  There was no tension.  The anastomosis was ~8cm from the anal verge.  I placed a blake drain into the pelvis and brought this out through the RLQ port site.  It was secured with a 2-0 Nylon suture.  The abdomen was irrigated.  The omentum was brought down over the abdomen.  I decided not to divert the anastomosis given there was no leak or tension.  The distal mucosa of the colon bled easily when cut and the patient had no radiation to the area.  We then switched to clean drapes, gowns, gloves and instruments.  All laps were removed from the abdomen.  The peritoneum was closed with a running 2-0 Vicryl suture.  THe fascia was closed with 2 #1 PDS sutures.  The subcutaneous tissue was closed with interrupted 2-0 Vicryl sutures.  The skin was closed with a running 4-0 Vicryl suture.  We then re-insufflated the abdomen and closed the 53mm  port site with a laparoscopic suture passer and a 0 Vicryl suture. The ports were then removed and the abdomen was desufflated.  The port sites were closed with 4-0 Vicryl sutures and dermabond.  The patient was then awakened from anesthesia and sent to the PACU in stable condition.  All counts were correct per OR staff.

## 2015-07-06 NOTE — Anesthesia Preprocedure Evaluation (Addendum)
Anesthesia Evaluation  Patient identified by MRN, date of birth, ID band Patient awake    Reviewed: Allergy & Precautions, NPO status , Patient's Chart, lab work & pertinent test results  Airway Mallampati: II  TM Distance: >3 FB Neck ROM: Full    Dental no notable dental hx.    Pulmonary sleep apnea , former smoker,  breath sounds clear to auscultation  Pulmonary exam normal       Cardiovascular hypertension, Pt. on medications Normal cardiovascular examRhythm:Regular Rate:Normal     Neuro/Psych PSYCHIATRIC DISORDERS Depression negative neurological ROS     GI/Hepatic negative GI ROS, Neg liver ROS,   Endo/Other  diabetes, Type 2  Renal/GU negative Renal ROS  negative genitourinary   Musculoskeletal  (+) Arthritis -,   Abdominal (+) + obese,   Peds negative pediatric ROS (+)  Hematology negative hematology ROS (+)   Anesthesia Other Findings   Reproductive/Obstetrics negative OB ROS                            Anesthesia Physical Anesthesia Plan  ASA: II  Anesthesia Plan: General   Post-op Pain Management:    Induction: Intravenous  Airway Management Planned: Oral ETT  Additional Equipment:   Intra-op Plan:   Post-operative Plan: Extubation in OR  Informed Consent: I have reviewed the patients History and Physical, chart, labs and discussed the procedure including the risks, benefits and alternatives for the proposed anesthesia with the patient or authorized representative who has indicated his/her understanding and acceptance.   Dental advisory given  Plan Discussed with: CRNA  Anesthesia Plan Comments:         Anesthesia Quick Evaluation

## 2015-07-06 NOTE — Anesthesia Procedure Notes (Signed)
Procedure Name: Intubation Date/Time: 07/06/2015 8:44 AM Performed by: Glory Buff Pre-anesthesia Checklist: Patient identified, Emergency Drugs available, Suction available and Patient being monitored Patient Re-evaluated:Patient Re-evaluated prior to inductionOxygen Delivery Method: Circle System Utilized Preoxygenation: Pre-oxygenation with 100% oxygen Intubation Type: IV induction Ventilation: Mask ventilation without difficulty Laryngoscope Size: Miller and 3 Grade View: Grade I Tube type: Oral Tube size: 7.5 mm Number of attempts: 1 Airway Equipment and Method: Stylet and Oral airway Placement Confirmation: ETT inserted through vocal cords under direct vision,  positive ETCO2 and breath sounds checked- equal and bilateral Secured at: 21 cm Tube secured with: Tape Dental Injury: Teeth and Oropharynx as per pre-operative assessment

## 2015-07-06 NOTE — Anesthesia Postprocedure Evaluation (Signed)
  Anesthesia Post-op Note  Patient: [name]  Procedure(s) Performed: Procedure(s) (LRB): XI ROBOTIC ASSISTED LOWER ANTERIOR RESECTION, rigid proctoscopy (N/A)  Patient Location: PACU  Anesthesia Type: General  Level of Consciousness: awake and alert   Airway and Oxygen Therapy: Patient Spontanous Breathing  Post-op Pain: mild  Post-op Assessment: Post-op Vital signs reviewed, Patient's Cardiovascular Status Stable, Respiratory Function Stable, Patent Airway and No signs of Nausea or vomiting. No resedation nor apnea in PACU. To floor on OSA precautions.  Last Vitals:  Filed Vitals:   07/06/15 1520  BP: 145/78  Pulse: 99  Temp: 36.8 C  Resp: 18    Post-op Vital Signs: stable   Complications: No apparent anesthesia complications

## 2015-07-06 NOTE — Progress Notes (Signed)
Pt has home CPAP to use tonight.  RT inspected for frayed or damaged cords and found none.  Machine seems to be working properly at this time.  Pt stated he will self administer when ready for sleep.  RT to monitor and assess as needed.

## 2015-07-06 NOTE — H&P (View-Only) (Signed)
[name] 06/14/2015 9:48 AM Location: [facility] Patient #: [id number] DOB: December 21, 1960 Divorced / Language: [name] / Race: Black or African American Male History of Present Illness [name] MD; 8/52/7782 10:52 AM) Patient words: rectal cancer.  The patient is a 54 year old male who presents with colorectal cancer. This is a 54 year old male who presents to the office with a newly diagnosed rectal cancer. This was noted by Dr. Gala Romney on screening colonoscopy. This was noted to be approximately 5 cm from the anal verge on colonoscopy. CT scan of the abdomen shows no signs of metastatic disease. There is some rectal thickening at the level of the coccyx. CEA is normal. Rectal ultrasound shows a T1/T2 lesion with no lymph node involvement noted. The patient is asymptomatic. He denies any rectal bleeding. He has regular bowel movements. Other Problems [name], LPN; 03/11/5360 4:43 AM) Arthritis Back Pain Diabetes Mellitus High blood pressure Rectal Cancer Sleep Apnea  Past Surgical History [name], LPN; 1/54/0086 7:61 AM) Gallbladder Surgery - Open Spinal Surgery - Lower Back Spinal Surgery Midback  Diagnostic Studies History [name], LPN; 9/50/9326 7:12 AM) Colonoscopy never  Allergies [name], LPN; 4/58/0998 3:38 AM) No Known Drug Allergies 06/14/2015  Medication History [name], LPN; 2/50/5397 6:73 AM) AmLODIPine Besylate (5MG  Tablet, Oral) Active. Gabapentin (800MG  Tablet, Oral) Active. Lisinopril (40MG  Tablet, Oral) Active. Nortriptyline HCl (75MG  Capsule, Oral) Active. TiZANidine HCl (2MG  Tablet, Oral) Active. Medications Reconciled  Social History [name], LPN; 03/07/3789 2:40 AM) Alcohol use Remotely quit alcohol use. Caffeine use Coffee, Tea. Illicit drug use Remotely quit drug use. Tobacco use Never smoker.  Family History [name], LPN; 9/73/5329 9:24 AM) Arthritis  Father. Diabetes Mellitus Father. Hypertension Father.     Review of Systems [name] LPN; 2/68/3419 6:22 AM) General Not Present- Appetite Loss, Chills, Fatigue, Fever, Night Sweats, Weight Gain and Weight Loss. Skin Not Present- Change in Wart/Mole, Dryness, Hives, Jaundice, New Lesions, Non-Healing Wounds, Rash and Ulcer. HEENT Present- Wears glasses/contact lenses. Not Present- Earache, Hearing Loss, Hoarseness, Nose Bleed, Oral Ulcers, Ringing in the Ears, Seasonal Allergies, Sinus Pain, Sore Throat, Visual Disturbances and Yellow Eyes. Respiratory Not Present- Bloody sputum, Chronic Cough, Difficulty Breathing, Snoring and Wheezing. Breast Not Present- Breast Mass, Breast Pain, Nipple Discharge and Skin Changes. Cardiovascular Not Present- Chest Pain, Difficulty Breathing Lying Down, Leg Cramps, Palpitations, Rapid Heart Rate, Shortness of Breath and Swelling of Extremities. Gastrointestinal Not Present- Abdominal Pain, Bloating, Bloody Stool, Change in Bowel Habits, Chronic diarrhea, Constipation, Difficulty Swallowing, Excessive gas, Gets full quickly at meals, Hemorrhoids, Indigestion, Nausea, Rectal Pain and Vomiting. Male Genitourinary Not Present- Blood in Urine, Change in Urinary Stream, Frequency, Impotence, Nocturia, Painful Urination, Urgency and Urine Leakage.  Vitals [name] LPN; 2/97/9892 1:19 AM) 06/14/2015 9:53 AM Weight: 214.6 lb Height: 63in Body Surface Area: 2.08 m Body Mass Index: 38.01 kg/m Temp.: 84F(Oral)  Pulse: 104 (Regular)  BP: 128/82 (Sitting, Left Arm, Standard)     Physical Exam [name] MD; 03/05/4080 12:40 PM)  General Mental Status-Alert. General Appearance-Consistent with stated age. Hydration-Well hydrated. Voice-Normal.  Head and Neck Head-normocephalic, atraumatic with no lesions or palpable masses. Trachea-midline. Thyroid Gland Characteristics - normal size and  consistency.  Eye Eyeball - Bilateral-Extraocular movements intact. Sclera/Conjunctiva - Bilateral-No scleral icterus.  Chest and Lung Exam Chest and lung exam reveals -quiet, even and easy respiratory effort with no use of accessory muscles and on auscultation, normal breath sounds, no adventitious sounds and normal vocal resonance.  Inspection Chest Wall - Normal. Back - normal.  Cardiovascular Cardiovascular examination reveals -normal heart sounds, regular rate and rhythm with no murmurs and normal pedal pulses bilaterally.  Abdomen Inspection Inspection of the abdomen reveals - No Hernias. Palpation/Percussion Palpation and Percussion of the abdomen reveal - Soft, Non Tender, No Rebound tenderness, No Rigidity (guarding) and No hepatosplenomegaly. Auscultation Auscultation of the abdomen reveals - Bowel sounds normal.  Rectal Anorectal Exam External - normal external exam. Internal - Note: No mass palpated.  Neurologic Neurologic evaluation reveals -alert and oriented x 3 with no impairment of recent or remote memory. Mental Status-Normal.  Musculoskeletal Global Assessment -Note:no gross deformities.  Normal Exam - Left-Upper Extremity Strength Normal and Lower Extremity Strength Normal. Normal Exam - Right-Upper Extremity Strength Normal and Lower Extremity Strength Normal.    Assessment & Plan [name] MD; 2/95/2841 12:42 PM)  PRIMARY CANCER OF RECTUM (154.1  C20) Impression: 54 year old male who was found to have a rectal mass on screening colonoscopy. 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 CT scan of the abdomen showed no signs of metastatic disease and CEA was normal. Rectal ultrasound showed a T1/T2 N0 lesion. This does not appear that it was tattooed. I have recommended the patient undergo upfront low anterior resection with possible diverting ileostomy. We discussed this in detail. We discussed that we would like to get a CT  scan of his chest to complete his metastatic workup. I will also plan to do a flexible proctoscopy for purpose of tattooing the lesion the day before his surgery.  The surgery and anatomy were described to the patient as well as the risks of surgery and the possible complications. These include: Bleeding, deep abdominal infections and possible wound complications such as hernia and infection, damage to adjacent structures, leak of surgical connections, which can lead to other surgeries and possibly an ostomy, possible need for other procedures, such as abscess drains in radiology, possible prolonged hospital stay, possible diarrhea from removal of part of the colon, possible constipation from narcotics, possible bowel, bladder or sexual dysfunction if having rectal surgery, prolonged fatigue/weakness or appetite loss, possible early recurrence of of disease, possible complications of their medical problems such as heart disease or arrhythmias or lung problems, death (less than 1%). I believe the patient understands and wishes to proceed with the surgery.

## 2015-07-06 NOTE — Transfer of Care (Signed)
Immediate Anesthesia Transfer of Care Note  Patient: [name]  Procedure(s) Performed: Procedure(s): XI ROBOTIC ASSISTED LOWER ANTERIOR RESECTION, rigid proctoscopy (N/A)  Patient Location: PACU  Anesthesia Type:General  Level of Consciousness: awake, alert  and oriented  Airway & Oxygen Therapy: Patient Spontanous Breathing and Patient connected to face mask oxygen  Post-op Assessment: Report given to RN and Post -op Vital signs reviewed and stable  Post vital signs: Reviewed and stable  Last Vitals:  Filed Vitals:   07/06/15 0639  BP: 138/93  Pulse: 110  Temp: 36.6 C  Resp: 20    Complications: No apparent anesthesia complications

## 2015-07-06 NOTE — Interval H&P Note (Signed)
History and Physical Interval Note:  07/06/2015 8:12 AM  [name]  has presented today for surgery, with the diagnosis of rectal cancer  The various methods of treatment have been discussed with the patient and family. After consideration of risks, benefits and other options for treatment, the patient has consented to  Procedure(s): XI ROBOTIC ASSISTED LOWER ANTERIOR RESECTION POSSIBLE DIVERTING OSTOMY (N/A) as a surgical intervention .  The patient's history has been reviewed, patient examined, no change in status, stable for surgery.  I have reviewed the patient's chart and labs.  Questions were answered to the patient's satisfaction.     Rosario Adie, MD  [facility]

## 2015-07-07 ENCOUNTER — Encounter (HOSPITAL_COMMUNITY): Payer: Self-pay | Admitting: General Surgery

## 2015-07-07 LAB — CBC
HCT: 39.2 % (ref 39.0–52.0)
Hemoglobin: 12.8 g/dL — ABNORMAL LOW (ref 13.0–17.0)
MCH: 28.1 pg (ref 26.0–34.0)
MCHC: 32.7 g/dL (ref 30.0–36.0)
MCV: 86.2 fL (ref 78.0–100.0)
PLATELETS: 150 10*3/uL (ref 150–400)
RBC: 4.55 MIL/uL (ref 4.22–5.81)
RDW: 12.5 % (ref 11.5–15.5)
WBC: 9.2 10*3/uL (ref 4.0–10.5)

## 2015-07-07 LAB — BASIC METABOLIC PANEL
Anion gap: 8 (ref 5–15)
BUN: 12 mg/dL (ref 6–20)
CO2: 27 mmol/L (ref 22–32)
CREATININE: 1.09 mg/dL (ref 0.61–1.24)
Calcium: 9.3 mg/dL (ref 8.9–10.3)
Chloride: 102 mmol/L (ref 101–111)
GFR calc Af Amer: >60 mL/min (ref >60)
GLUCOSE: 107 mg/dL — AB (ref 65–99)
Potassium: 4 mmol/L (ref 3.5–5.1)
SODIUM: 137 mmol/L (ref 135–145)

## 2015-07-07 MED ORDER — KCL IN DEXTROSE-NACL 20-5-0.45 MEQ/L-%-% IV SOLN
INTRAVENOUS | Status: DC
  Administered 2015-07-07 – 2015-07-09 (×2): via INTRAVENOUS
  Filled 2015-07-07 (×5): qty 1000

## 2015-07-07 NOTE — Evaluation (Signed)
Physical Therapy Evaluation Patient Details Name: [name] MRN: [id number] DOB: May 10, 1961 Today's Date: 07/07/2015   History of Present Illness  Pt is a 54 year old male admitted for rectal cancer s/p robotic assisted lower anterior resection, rigid proctoscopy  Clinical Impression  Pt admitted with above diagnosis. Pt currently with functional limitations due to the deficits listed below (see PT Problem List).  Pt will benefit from skilled PT to increase their independence and safety with mobility to allow discharge to the venue listed below.   Pt assisted out of bed and ambulated around room today.  Pt will likely progress to home with no needs.     Follow Up Recommendations No PT follow up    Equipment Recommendations  None recommended by PT    Recommendations for Other Services       Precautions / Restrictions Precautions Precautions: Fall Precaution Comments: right JP abdominal drain      Mobility  Bed Mobility Overal bed mobility: Needs Assistance Bed Mobility: Supine to Sit     Supine to sit: Min assist;HOB elevated     General bed mobility comments: assist for trunk upright due to pain  Transfers Overall transfer level: Needs assistance Equipment used: None Transfers: Sit to/from Stand Sit to Stand: Min assist         General transfer comment: initial assist to rise and steady however performed again due to leaking around drain site (RN into room and changed dressing)  Ambulation/Gait Ambulation/Gait assistance: Min guard Ambulation Distance (Feet): 20 Feet Assistive device: None Gait Pattern/deviations: Step-through pattern;Decreased stride length;Wide base of support     General Gait Details: pt pushed IV pole, only wished to ambulate around room to start  Stairs            Wheelchair Mobility    Modified Rankin (Stroke Patients Only)       Balance                                             Pertinent  Vitals/Pain Pain Assessment: 0-10 Pain Score: 10-Worst pain ever Pain Location: abdomen, holding drain site with mobility Pain Descriptors / Indicators: Sore;Grimacing;Operative site guarding Pain Intervention(s): Limited activity within patient's tolerance;Monitored during session;Repositioned;Ice applied;Premedicated before session    Home Living Family/patient expects to be discharged to:: Private residence Living Arrangements: Children;Spouse/significant other   Type of Home: House       Home Layout: One level Home Equipment: None      Prior Function Level of Independence: Independent               Hand Dominance        Extremity/Trunk Assessment   Upper Extremity Assessment: Overall WFL for tasks assessed           Lower Extremity Assessment: Overall WFL for tasks assessed         Communication   Communication: No difficulties  Cognition Arousal/Alertness: Awake/alert Behavior During Therapy: WFL for tasks assessed/performed Overall Cognitive Status: Within Functional Limits for tasks assessed                      General Comments      Exercises        Assessment/Plan    PT Assessment Patient needs continued PT services  PT Diagnosis Difficulty walking;Acute pain   PT Problem List Decreased activity  tolerance;Decreased mobility;Pain  PT Treatment Interventions DME instruction;Gait training;Functional mobility training;Patient/family education;Therapeutic activities;Therapeutic exercise   PT Goals (Current goals can be found in the Care Plan section) Acute Rehab PT Goals PT Goal Formulation: With patient Time For Goal Achievement: 07/14/15 Potential to Achieve Goals: Good    Frequency Min 3X/week   Barriers to discharge        Co-evaluation               End of Session   Activity Tolerance: Patient tolerated treatment well Patient left: in chair;with call bell/phone within reach (with MD)           Time:  7564-3329 PT Time Calculation (min) (ACUTE ONLY): 18 min   Charges:   PT Evaluation $Initial PT Evaluation Tier I: 1 Procedure     PT G Codes:        Liesa Tsan,KATHrine E 07/07/2015, 12:32 PM Carmelia Bake, PT, DPT 07/07/2015 Pager: [phone]

## 2015-07-07 NOTE — Progress Notes (Signed)
Pt has home CPAP machine and will self administer when ready for bed.  H20 chamber filled and machine is ready for use.  RT to monitor and assess as needed.

## 2015-07-07 NOTE — Progress Notes (Signed)
1 Day Post-Op LAR Subjective: Pt doing well.  Denies nausea.  Getting OOB.  Pain controlled  Objective: Vital signs in last 24 hours: Temp:  [98.2 F (36.8 C)-100.1 F (37.8 C)] 98.3 F (36.8 C) (08/18 0955) Pulse Rate:  [95-115] 113 (08/18 0955) Resp:  [18-24] 18 (08/18 0955) BP: (124-150)/(74-94) 124/85 mmHg (08/18 0955) SpO2:  [94 %-100 %] 99 % (08/18 0955)   Intake/Output from previous day: 08/17 0701 - 08/18 0700 In: 4000 [I.V.:4000] Out: 3040 [Urine:2575; Drains:165; Blood:300] Intake/Output this shift: Total I/O In: -  Out: 235 [Urine:225; Drains:10]   General appearance: alert and cooperative GI: normal findings: soft, non-tender  Incision: no significant drainage  Lab Results:   Recent Labs  07/07/15 0559  WBC 9.2  HGB 12.8*  HCT 39.2  PLT 150   BMET  Recent Labs  07/07/15 0559  NA 137  K 4.0  CL 102  CO2 27  GLUCOSE 107*  BUN 12  CREATININE 1.09  CALCIUM 9.3   PT/INR No results for input(s): LABPROT, INR in the last 72 hours. ABG No results for input(s): PHART, HCO3 in the last 72 hours.  Invalid input(s): PCO2, PO2  MEDS, Scheduled . acetaminophen  1,000 mg Oral 4 times per day  . alvimopan  12 mg Oral BID  . amLODipine  5 mg Oral q morning - 10a  . antiseptic oral rinse  7 mL Mouth Rinse q12n4p  . chlorhexidine  15 mL Mouth Rinse BID  . enoxaparin (LOVENOX) injection  40 mg Subcutaneous Q24H  . gabapentin  800 mg Oral TID  . lisinopril  40 mg Oral q morning - 10a  . nortriptyline  75 mg Oral QHS    Studies/Results: No results found.  Assessment: s/p Procedure(s): XI ROBOTIC ASSISTED LOWER ANTERIOR RESECTION, rigid proctoscopy Patient Active Problem List   Diagnosis Date Noted  . Rectal cancer 07/06/2015  . History of colonic polyps   . Rectal mass   . Diverticulosis of colon without hemorrhage   . Taking medication for chronic disease 03/24/2015  . Encounter for screening colonoscopy 03/24/2015    Expected post op  course  Plan: Advance diet to clears Ambulate Decrease MIV   LOS: 1 day     .Rosario Adie, [facility], [place]   07/07/2015 10:48 AM

## 2015-07-08 LAB — BASIC METABOLIC PANEL
ANION GAP: 6 (ref 5–15)
BUN: 9 mg/dL (ref 6–20)
CALCIUM: 8.7 mg/dL — AB (ref 8.9–10.3)
CO2: 27 mmol/L (ref 22–32)
CREATININE: 0.98 mg/dL (ref 0.61–1.24)
Chloride: 102 mmol/L (ref 101–111)
GLUCOSE: 102 mg/dL — AB (ref 65–99)
Potassium: 3.5 mmol/L (ref 3.5–5.1)
Sodium: 135 mmol/L (ref 135–145)

## 2015-07-08 LAB — CBC
HCT: 35.5 % — ABNORMAL LOW (ref 39.0–52.0)
HEMOGLOBIN: 11.6 g/dL — AB (ref 13.0–17.0)
MCH: 28.4 pg (ref 26.0–34.0)
MCHC: 32.7 g/dL (ref 30.0–36.0)
MCV: 87 fL (ref 78.0–100.0)
PLATELETS: 136 10*3/uL — AB (ref 150–400)
RBC: 4.08 MIL/uL — ABNORMAL LOW (ref 4.22–5.81)
RDW: 12.6 % (ref 11.5–15.5)
WBC: 9.2 10*3/uL (ref 4.0–10.5)

## 2015-07-08 NOTE — Progress Notes (Signed)
2 Days Post-Op LAR Subjective: Pt doing well.  Denies nausea currently.  Getting OOB.  Pain controlled. Ambulating some  Objective: Vital signs in last 24 hours: Temp:  [98.2 F (36.8 C)-99.1 F (37.3 C)] 98.7 F (37.1 C) (08/19 0522) Pulse Rate:  [106-114] 114 (08/19 0522) Resp:  [18] 18 (08/19 0522) BP: (99-124)/(70-85) 107/74 mmHg (08/19 0522) SpO2:  [93 %-100 %] 95 % (08/19 0522)   Intake/Output from previous day: 08/18 0701 - 08/19 0700 In: 1108.3 [P.O.:240; I.V.:868.3] Out: 2045 [Urine:2000; Drains:45] Intake/Output this shift:   General appearance: alert and cooperative GI: normal findings: soft, non-tender  Incision: no significant drainage  Lab Results:   Recent Labs  07/07/15 0559 07/08/15 0431  WBC 9.2 9.2  HGB 12.8* 11.6*  HCT 39.2 35.5*  PLT 150 136*   BMET  Recent Labs  07/07/15 0559 07/08/15 0431  NA 137 135  K 4.0 3.5  CL 102 102  CO2 27 27  GLUCOSE 107* 102*  BUN 12 9  CREATININE 1.09 0.98  CALCIUM 9.3 8.7*   PT/INR No results for input(s): LABPROT, INR in the last 72 hours. ABG No results for input(s): PHART, HCO3 in the last 72 hours.  Invalid input(s): PCO2, PO2  MEDS, Scheduled . alvimopan  12 mg Oral BID  . amLODipine  5 mg Oral q morning - 10a  . antiseptic oral rinse  7 mL Mouth Rinse q12n4p  . chlorhexidine  15 mL Mouth Rinse BID  . enoxaparin (LOVENOX) injection  40 mg Subcutaneous Q24H  . gabapentin  800 mg Oral TID  . lisinopril  40 mg Oral q morning - 10a  . nortriptyline  75 mg Oral QHS    Studies/Results: No results found.  Assessment: s/p Procedure(s): XI ROBOTIC ASSISTED LOWER ANTERIOR RESECTION, rigid proctoscopy Patient Active Problem List   Diagnosis Date Noted  . Rectal cancer 07/06/2015  . History of colonic polyps   . Rectal mass   . Diverticulosis of colon without hemorrhage   . Taking medication for chronic disease 03/24/2015  . Encounter for screening colonoscopy 03/24/2015    Expected  post op course  Plan: Cont clears as tolerated Ambulate Cont MIV   LOS: 2 days     .Rosario Adie, [facility], [place] [phone]   07/08/2015 8:30 AM

## 2015-07-08 NOTE — Care Management Important Message (Signed)
Important Message  Patient Details  Name: [name] MRN: [id number] Date of Birth: [birth year]   Medicare Important Message Given:  [facility] notification given    Camillo Flaming 07/08/2015, 11:59 AMImportant Message  Patient Details  Name: [name] MRN: [id number] Date of Birth: Jul 19, 1961   Medicare Important Message Given:  Yes-second notification given    Camillo Flaming 07/08/2015, 11:59 AM

## 2015-07-08 NOTE — Progress Notes (Signed)
Assisted pt with placing his home cpap on.  Pt tolerating well at this time.  Sterile water added to humidifier.  No frays or obvious defects noted with home machine.  HR104, spo2 95%.

## 2015-07-09 LAB — CBC
HCT: 35.5 % — ABNORMAL LOW (ref 39.0–52.0)
Hemoglobin: 11.5 g/dL — ABNORMAL LOW (ref 13.0–17.0)
MCH: 28.2 pg (ref 26.0–34.0)
MCHC: 32.4 g/dL (ref 30.0–36.0)
MCV: 87 fL (ref 78.0–100.0)
PLATELETS: 165 10*3/uL (ref 150–400)
RBC: 4.08 MIL/uL — AB (ref 4.22–5.81)
RDW: 12.7 % (ref 11.5–15.5)
WBC: 8.8 10*3/uL (ref 4.0–10.5)

## 2015-07-09 LAB — BASIC METABOLIC PANEL
Anion gap: 6 (ref 5–15)
BUN: 8 mg/dL (ref 6–20)
CALCIUM: 8.8 mg/dL — AB (ref 8.9–10.3)
CHLORIDE: 103 mmol/L (ref 101–111)
CO2: 27 mmol/L (ref 22–32)
CREATININE: 0.97 mg/dL (ref 0.61–1.24)
Glucose, Bld: 109 mg/dL — ABNORMAL HIGH (ref 65–99)
Potassium: 3.6 mmol/L (ref 3.5–5.1)
SODIUM: 136 mmol/L (ref 135–145)

## 2015-07-09 NOTE — Progress Notes (Signed)
Patient has home CPAP machine bedside and ready for use.  Patient will place on himself when ready for bed.  Patient aware to call Respiratory if he needs additional assistance.

## 2015-07-09 NOTE — Progress Notes (Signed)
3 Days Post-Op  Subjective: Tolerating some clear liquids.  Bowels starting to move.  Occasional nausea.  Objective: Vital signs in last 24 hours: Temp:  [97.9 F (36.6 C)-98.4 F (36.9 C)] 98.3 F (36.8 C) (08/20 0601) Pulse Rate:  [101-122] 101 (08/20 0601) Resp:  [18] 18 (08/20 0601) BP: (103-137)/(64-78) 122/78 mmHg (08/20 0601) SpO2:  [99 %-100 %] 99 % (08/20 0601) Last BM Date: 07/08/15  Intake/Output from previous day: 08/19 0701 - 08/20 0700 In: 1200 [I.V.:1200] Out: 1335 [Urine:1300; Drains:35] Intake/Output this shift:    PE: General- In NAD Abdomen-soft, lower transverse incision is clean and intact, active bowel sounds, thin serosanguinous drain output  Lab Results:   Recent Labs  07/08/15 0431 07/09/15 0604  WBC 9.2 8.8  HGB 11.6* 11.5*  HCT 35.5* 35.5*  PLT 136* 165   BMET  Recent Labs  07/08/15 0431 07/09/15 0604  NA 135 136  K 3.5 3.6  CL 102 103  CO2 27 27  GLUCOSE 102* 109*  BUN 9 8  CREATININE 0.98 0.97  CALCIUM 8.7* 8.8*   PT/INR No results for input(s): LABPROT, INR in the last 72 hours. Comprehensive Metabolic Panel:    Component Value Date/Time   NA 136 07/09/2015 0604   NA 135 07/08/2015 0431   K 3.6 07/09/2015 0604   K 3.5 07/08/2015 0431   CL 103 07/09/2015 0604   CL 102 07/08/2015 0431   CO2 27 07/09/2015 0604   CO2 27 07/08/2015 0431   BUN 8 07/09/2015 0604   BUN 9 07/08/2015 0431   CREATININE 0.97 07/09/2015 0604   CREATININE 0.98 07/08/2015 0431   GLUCOSE 109* 07/09/2015 0604   GLUCOSE 102* 07/08/2015 0431   CALCIUM 8.8* 07/09/2015 0604   CALCIUM 8.7* 07/08/2015 0431   AST 26 06/02/2015 1600   ALT 28 06/02/2015 1600   ALKPHOS 69 06/02/2015 1600   BILITOT 0.7 06/02/2015 1600   PROT 8.4* 06/02/2015 1600   ALBUMIN 4.4 06/02/2015 1600     Studies/Results: No results found.  Anti-infectives: Anti-infectives    Start     Dose/Rate Route Frequency Ordered Stop   07/06/15 2000  cefoTEtan (CEFOTAN) 2 g in  dextrose 5 % 50 mL IVPB     2 g 100 mL/hr over 30 Minutes Intravenous Every 12 hours 07/06/15 1535 07/06/15 2036   07/06/15 0633  cefoTEtan (CEFOTAN) 2 g in dextrose 5 % 50 mL IVPB     2 g 100 mL/hr over 30 Minutes Intravenous On call to O.R. 07/06/15 2878 07/06/15 0848      Assessment Active Problems:   Rectal cancer s/p robotic LAR 8/17-bowel function returning.    LOS: 3 days   Plan: Ambulate.  Full liquids.   Adajah Cocking Lenna Sciara 07/09/2015

## 2015-07-10 MED ORDER — OXYCODONE HCL 5 MG PO TABS
5.0000 mg | ORAL_TABLET | ORAL | Status: DC | PRN
  Administered 2015-07-10: 10 mg via ORAL
  Administered 2015-07-11: 5 mg via ORAL
  Filled 2015-07-10: qty 1
  Filled 2015-07-10: qty 2

## 2015-07-10 NOTE — Progress Notes (Signed)
4 Days Post-Op  Subjective: Feels good today.  Tolerated full liquids.  No nausea.  Bowels moving  Objective: Vital signs in last 24 hours: Temp:  [97.7 F (36.5 C)-98.4 F (36.9 C)] 97.7 F (36.5 C) (08/21 0600) Pulse Rate:  [96-112] 96 (08/21 0600) Resp:  [18] 18 (08/21 0600) BP: (113-122)/(69-80) 115/77 mmHg (08/21 0600) SpO2:  [98 %-99 %] 98 % (08/21 0600) Last BM Date: 07/09/15  Intake/Output from previous day: 08/20 0701 - 08/21 0700 In: 1920 [P.O.:720; I.V.:1200] Out: 745 [Urine:725; Drains:20] Intake/Output this shift:    PE: General- In NAD. Smiling Abdomen-soft, lower transverse incision is clean and intact, thin serosanguinous drain output  Lab Results:   Recent Labs  07/08/15 0431 07/09/15 0604  WBC 9.2 8.8  HGB 11.6* 11.5*  HCT 35.5* 35.5*  PLT 136* 165   BMET  Recent Labs  07/08/15 0431 07/09/15 0604  NA 135 136  K 3.5 3.6  CL 102 103  CO2 27 27  GLUCOSE 102* 109*  BUN 9 8  CREATININE 0.98 0.97  CALCIUM 8.7* 8.8*   PT/INR No results for input(s): LABPROT, INR in the last 72 hours. Comprehensive Metabolic Panel:    Component Value Date/Time   NA 136 07/09/2015 0604   NA 135 07/08/2015 0431   K 3.6 07/09/2015 0604   K 3.5 07/08/2015 0431   CL 103 07/09/2015 0604   CL 102 07/08/2015 0431   CO2 27 07/09/2015 0604   CO2 27 07/08/2015 0431   BUN 8 07/09/2015 0604   BUN 9 07/08/2015 0431   CREATININE 0.97 07/09/2015 0604   CREATININE 0.98 07/08/2015 0431   GLUCOSE 109* 07/09/2015 0604   GLUCOSE 102* 07/08/2015 0431   CALCIUM 8.8* 07/09/2015 0604   CALCIUM 8.7* 07/08/2015 0431   AST 26 06/02/2015 1600   ALT 28 06/02/2015 1600   ALKPHOS 69 06/02/2015 1600   BILITOT 0.7 06/02/2015 1600   PROT 8.4* 06/02/2015 1600   ALBUMIN 4.4 06/02/2015 1600     Studies/Results: No results found.  Anti-infectives: Anti-infectives    Start     Dose/Rate Route Frequency Ordered Stop   07/06/15 2000  cefoTEtan (CEFOTAN) 2 g in dextrose 5 % 50  mL IVPB     2 g 100 mL/hr over 30 Minutes Intravenous Every 12 hours 07/06/15 1535 07/06/15 2036   07/06/15 0633  cefoTEtan (CEFOTAN) 2 g in dextrose 5 % 50 mL IVPB     2 g 100 mL/hr over 30 Minutes Intravenous On call to O.R. 07/06/15 9935 07/06/15 0848      Assessment Active Problems:   Rectal cancer s/p robotic LAR 8/17-progressing well    LOS: 4 days   Plan: [name] diet.  Heplock IV.  Hopefully home tomorrow.   Pal Shell Lenna Sciara 07/10/2015

## 2015-07-11 MED ORDER — OXYCODONE HCL 5 MG PO TABS: 5.0000 mg | ORAL_TABLET | ORAL | Status: DC | PRN

## 2015-07-11 NOTE — Progress Notes (Signed)
Vital signs WNL, no c/o pain.  JP drain removed per MD order without diff..  Patient tolerated procedure well.  Patient is tolerating regular diet.  Patient has had BM.  Discussed discharge instructions with patient.  Patient verbalizes understanding.  D/C to home.

## 2015-07-11 NOTE — Discharge Summary (Signed)
Physician Discharge Summary  Patient ID: [name] MRN: [id number] DOB/AGE: 1960/12/02 54 y.o.  Admit date: 07/06/2015 Discharge date: 07/11/2015  Admission Diagnoses:  Discharge Diagnoses:  Active Problems:   Rectal cancer   Discharged Condition: good  Hospital Course: 54 yo male underwent robotic assisted LAR. Postoperatively he was admitted to med surg floor for pain control, maintenance fluid. He was initially bloated but improved and by day 3 he was tolerating clear liquids. He was slowly advanced and had bowel movements Day 5 and tolerated a regular diet.  Consults: None  Significant Diagnostic Studies: labs: WBC 8.8, Cr 0.97  Treatments: IV hydration and analgesia: acetaminophen, Morphine and oxycodone  Discharge Exam: Blood pressure 135/81, pulse 96, temperature 97.5 F (36.4 C), temperature source Oral, resp. rate 18, height 5\' 3"  (1.6 m), weight 97.07 kg (214 lb), SpO2 96 %. General appearance: alert, cooperative and appears stated age Resp: clear to auscultation bilaterally Cardio: regular rate and rhythm, S1, S2 normal, no murmur, click, rub or gallop GI: soft, non-tender; bowel sounds normal; no masses,  no organomegaly and drain with serous drainage, wound C/d/i  Disposition: 01-Home or Self Care  Discharge Instructions    Call MD for:  extreme fatigue    Complete by:  As directed      Call MD for:  hives    Complete by:  As directed      Call MD for:  persistant nausea and vomiting    Complete by:  As directed      Call MD for:  redness, tenderness, or signs of infection (pain, swelling, redness, odor or green/yellow discharge around incision site)    Complete by:  As directed      Call MD for:  severe uncontrolled pain    Complete by:  As directed      Call MD for:    Complete by:  As directed   Temperature > 101.59F     Diet - low sodium heart healthy    Complete by:  As directed      Discharge instructions    Complete by:  As directed   Please see  discharge instruction sheets.  Also refer to handout given an office.  Please call our office if you have any questions or concerns [phone]     Discharge wound care:    Complete by:  As directed   If you have closed incisions, shower and bathe over these incisions with soap and water every day.  Remove all surgical dressings on postoperative day #3.  You do not need to replace dressings over the closed incisions unless you feel more comfortable with a Band-Aid covering it.   If you have an open wound that requires packing, please see wound care instructions.  In general, remove all dressings, wash wound with soap and water and then replace with saline moistened gauze.  Do the dressing change at least every day.  Please call our office [phone] if you have further questions.     Driving Restrictions    Complete by:  As directed   No driving until off narcotics and can safely swerve away without pain during an emergency     Increase activity slowly    Complete by:  As directed   Walk an hour a day.  Use 20-30 minute walks.  When you can walk 30 minutes without difficulty, it is fine to restart low impact/moderate activities such as biking, jogging, swimming, sexual activity, etc.  Eventually you  can increase to unrestricted activity when not feeling pain.  If you feel pain: STOP!Marland Kitchen   Let pain protect you from overdoing it.  Use ice/heat & over-the-counter pain medications to help minimize soreness.  If that is not enough, then use your narcotic pain prescription as needed to remain active.  It is better to take extra pain medications and be more active than to stay bedridden to avoid all pain medications.     Lifting restrictions    Complete by:  As directed   Avoid heavy lifting initially.  Do not push through pain.  You have no specific weight limit - if it hurts to do, DON'T DO IT.   If you feel no pain, you are not injuring anything.  Pain will protect you from injury.  Coughing and  sneezing are far more stressful to your incision than any lifting.  Avoid resuming heavy lifting / intense activity until off all narcotic pain medications.  When ready to exercise more, give yourself 2 weeks to gradually get back to full intense exercise/activity.     May shower / Bathe    Complete by:  As directed      May walk up steps    Complete by:  As directed      Remove drains / tubes    Complete by:  As directed      Sexual Activity Restrictions    Complete by:  As directed   Sexual activity as tolerated.  Do not push through pain.  Pain will protect you from injury.     Walk with assistance    Complete by:  As directed   Walk over an hour a day.  May use a walker/cane/companion to help with balance and stamina.            Medication List    STOP taking these medications        HYDROcodone-acetaminophen 7.5-325 MG per tablet  Commonly known as:  NORCO     traMADol 50 MG tablet  Commonly known as:  ULTRAM      TAKE these medications        amLODipine 5 MG tablet  Commonly known as:  NORVASC  Take 5 mg by mouth every morning.     gabapentin 800 MG tablet  Commonly known as:  NEURONTIN  Take 800 mg by mouth 3 (three) times daily.     lisinopril 40 MG tablet  Commonly known as:  PRINIVIL,ZESTRIL  Take 40 mg by mouth every morning.     NON FORMULARY  PT HAS A C-PAP MACHINE     nortriptyline 75 MG capsule  Commonly known as:  PAMELOR  Take 75 mg by mouth at bedtime.     oxyCODONE 5 MG immediate release tablet  Commonly known as:  Oxy IR/ROXICODONE  Take 1-2 tablets (5-10 mg total) by mouth every 4 (four) hours as needed for moderate pain, severe pain or breakthrough pain.     tiZANidine 2 MG tablet  Commonly known as:  ZANAFLEX  Take 2 mg by mouth every 8 (eight) hours as needed for muscle spasms.           Follow-up Information    Follow up with Rosario Adie., MD In 2 weeks.   Specialty:  General Surgery   Contact information:   [address]  [place] [address] [phone]       Signed: Arta Bruce Debhora Titus 07/11/2015, 11:19 AM

## 2015-07-15 ENCOUNTER — Encounter (HOSPITAL_COMMUNITY): Payer: Self-pay

## 2015-07-27 ENCOUNTER — Encounter (HOSPITAL_COMMUNITY): Payer: Commercial Managed Care - HMO | Attending: Hematology & Oncology | Admitting: Hematology & Oncology

## 2015-07-27 VITALS — BP 124/77 | HR 95 | Temp 97.9°F | Resp 18 | Wt 208.8 lb

## 2015-07-27 DIAGNOSIS — G8918 Other acute postprocedural pain: Secondary | ICD-10-CM

## 2015-07-27 DIAGNOSIS — C20 Malignant neoplasm of rectum: Secondary | ICD-10-CM

## 2015-07-27 MED ORDER — OXYCODONE HCL 10 MG PO TABS: ORAL_TABLET | ORAL | Status: DC

## 2015-07-27 NOTE — Patient Instructions (Signed)
[facility] Discharge Instructions  RECOMMENDATIONS MADE BY THE CONSULTANT AND ANY TEST RESULTS WILL BE SENT TO YOUR REFERRING PHYSICIAN.  Exam and discussion by Dr. Whitney Muse. You have Stage 3 colon cancer and will need to be treated with chemotherapy and at some point radiation therapy. Dr. Whitney Muse will contact Dr. [name] to get you scheduled for port a cath placement. You will meet Lupita Raider our nurse navigator and she will get you scheduled for chemotherapy teaching. Plans are to treat with Folfox. Prescription for Oxycodone - take as directed. Instructions for constipation given.  Follow-up to be scheduled and you will be contacted.   Thank you for choosing [place] to provide your oncology and hematology care.  To afford each patient quality time with our provider, please arrive at least 15 minutes before your scheduled appointment time.    You need to re-schedule your appointment should you arrive 10 or more minutes late.  We strive to give you quality time with our providers, and arriving late affects you and other patients whose appointments are after yours.  Also, if you no show three or more times for appointments you may be dismissed from the clinic at the providers discretion.     Again, thank you for choosing [facility].  Our hope is that these requests will decrease the amount of time that you wait before being seen by our physicians.       _____________________________________________________________  Should you have questions after your visit to [facility], please contact our office at [phone] between the hours of 8:30 a.m. and 4:30 p.m.  Voicemails left after 4:30 p.m. will not be returned until the following business day.  For prescription refill requests, have your pharmacy contact our office.

## 2015-07-27 NOTE — Progress Notes (Signed)
[place] at [facility] Progress Note  Patient Care Team: Rosita Fire, MD as PCP - General (Internal Medicine) Daneil Dolin, MD as Consulting Physician (Gastroenterology)  CHIEF COMPLAINTS/PURPOSE OF CONSULTATION:  Newly diagnosed T1/T2 N0 adenocarcinoma of the rectum Robotic assisted LAR, rigid proctoscopy with Dr. [name] 9/73/5329 Lymph nodes: number examined 14; number positive: 1 Pathologic Staging: pT1, pN1a, pMX Normal preoperative CEA   HISTORY OF PRESENTING ILLNESS:  [name] 54 y.o. male is here for follow-up of his rectal cancer. He has undergone definitive surgical therapy and did remarkably well. Unfortunately he did have one lymph node that contained metastatic disease. He notes he is pretty suspicious he needs more therapy. CT imaging prior to surgery revealed no evidence of metastatic disease.  Mr. Eckley is here alone today.  He states that since his surgery, he's done some walking and a "lot of eating."  He says he was still sore for a while after his surgery. The left side of his abdomen "still isn't letting him sleep" due to pain.  His bowels have been unusual since his surgery . He remarks that about a week after his surgery, his bowels weren't moving at all. Then yesterday, he says that he went to the bathroom as much as five times. He didn't feel that he had eaten anything unusual to stimulate this -- only chicken and a little bit of fruit.   He notes he is very open to proceeding with additional therapy needed. His mood is overall good.  MEDICAL HISTORY:  Past Medical History  Diagnosis Date  . Diabetes mellitus   . Hypertension   . High cholesterol   . Sleep apnea   . Rectal cancer   . Depression   . Arthritis     SURGICAL HISTORY: Past Surgical History  Procedure Laterality Date  . Back surgery    . Cholecystectomy    . Colonoscopy with propofol N/A 03/31/2015    Procedure: COLONOSCOPY WITH PROPOFOL at cecum  0842; withdrawal time=44mnutes;  Surgeon: RDaneil Dolin MD;  Location: [facility];  Service: Endoscopy;  Laterality: N/A;  . Polypectomy  03/31/2015    Procedure: POLYPECTOMY;  Surgeon: RDaneil Dolin MD;  Location: [facility];  Service: Endoscopy;;  . Esophageal biopsy  03/31/2015    Procedure: BIOPSY;  Surgeon: RDaneil Dolin MD;  Location: [facility];  Service: Endoscopy;;  . Flexible sigmoidoscopy N/A 07/05/2015    Procedure: FLEXIBLE SIGMOIDOSCOPY;  Surgeon: ALeighton Ruff MD;  Location: [facility];  Service: Endoscopy;  Laterality: N/A;  with tattoo  . Xi robotic assisted lower anterior resection N/A 07/06/2015    Procedure: XI ROBOTIC ASSISTED LOWER ANTERIOR RESECTION, rigid proctoscopy;  Surgeon: ALeighton Ruff MD;  Location: [facility];  Service: General;  Laterality: N/A;    SOCIAL HISTORY: Social History   Social History  . Marital Status: Legally Separated    Spouse Name: N/A  . Number of Children: N/A  . Years of Education: N/A   Occupational History  . Not on file.   Social History Main Topics  . Smoking status: Former Smoker    Quit date: 03/23/2006  . Smokeless tobacco: Never Used  . Alcohol Use: No  . Drug Use: No  . Sexual Activity: Not on file   Other Topics Concern  . 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 Colon cancer Neg Hx     "Not that I know of"  . Colon polyps Neg Hx     "not that I know of"  . Hypertension      "Not sure who, I don't know much about my family"  . Diabetes      "Not sure who, I don't know much about my family"   has no family status information on file.  Mother deceased, 25, liver cancer Father living, 65 3 brothers 1 sister  ALLERGIES:  has No Known Allergies.  MEDICATIONS:  Current Outpatient Prescriptions  Medication Sig  Dispense Refill  . ACCU-CHEK SMARTVIEW test strip     . amLODipine (NORVASC) 5 MG tablet Take 5 mg by mouth every morning.     . gabapentin (NEURONTIN) 800 MG tablet Take 800 mg by mouth 3 (three) times daily.     Marland Kitchen lisinopril (PRINIVIL,ZESTRIL) 40 MG tablet Take 40 mg by mouth every morning.     . NON FORMULARY PT HAS A C-PAP MACHINE    . nortriptyline (PAMELOR) 75 MG capsule Take 75 mg by mouth at bedtime.      Marland Kitchen oxyCODONE (OXY IR/ROXICODONE) 5 MG immediate release tablet Take 1-2 tablets (5-10 mg total) by mouth every 4 (four) hours as needed for moderate pain, severe pain or breakthrough pain. 40 tablet 0  . tiZANidine (ZANAFLEX) 2 MG tablet Take 2 mg by mouth every 8 (eight) hours as needed for muscle spasms.    . traMADol (ULTRAM) 50 MG tablet     . dextrose 5 % SOLN 1,000 mL with fluorouracil 5 GM/100ML SOLN Inject into the vein. To be given every 14 days x 12 cycles. To run for 46 hours. Has not started yet.    Marland Kitchen LEUCOVORIN CALCIUM IJ Inject as directed. To be given every 14 days x 12 cycles. Has not started yet.    . lidocaine-prilocaine (EMLA) cream Apply a quarter size amount to port site 1 hour prior to chemo. Do not rub in. Cover with plastic wrap. 30 g 3  . ondansetron (ZOFRAN) 8 MG tablet Take 1 tablet (8 mg total) by mouth every 8 (eight) hours as needed for nausea or vomiting. 90 tablet 0  . OXALIPLATIN IV Inject into the vein. To be given every 14 days x 12 cycles. Has not started yet.    Marland Kitchen oxyCODONE 10 MG TABS Take one tablet as needed every 4 hours for pain 60 tablet 0  . prochlorperazine (COMPAZINE) 10 MG tablet Take 1 tablet (10 mg total) by mouth every 6 (six) hours as needed for nausea or vomiting. 90 tablet 0   No current facility-administered medications for this visit.    Review of Systems  Constitutional: Negative for fever, chills, weight loss and malaise/fatigue.  HENT: Negative for congestion, hearing loss, nosebleeds, sore throat and tinnitus.   Eyes: Negative  for blurred vision, double vision, pain and discharge.  Respiratory: Negative for cough, hemoptysis, sputum production, shortness of breath and wheezing.   Cardiovascular: Negative for chest pain, palpitations, claudication, leg swelling and PND.  Gastrointestinal: Negative for heartburn, nausea, vomiting, abdominal pain, diarrhea, constipation, blood in stool and melena.  Genitourinary: Negative for dysuria, urgency, frequency and hematuria.  Musculoskeletal: Negative for myalgias, joint pain and falls.  Skin: Negative for itching and rash.  Neurological: Negative for dizziness, tingling, tremors, sensory change, speech change, focal weakness, seizures, loss of  consciousness, weakness and headaches.  Endo/Heme/Allergies: Does not bruise/bleed easily.  Psychiatric/Behavioral: Negative for depression, suicidal ideas, memory loss and substance abuse. The patient is not nervous/anxious and does not have insomnia.   All other systems reviewed and are negative.  14 point ROS was done and is otherwise as detailed above or in HPI   PHYSICAL EXAMINATION: ECOG PERFORMANCE STATUS: 0 - Asymptomatic  Filed Vitals:   07/27/15 1210  BP: 124/77  Pulse: 95  Temp: 97.9 F (36.6 C)  Resp: 18   Filed Weights   07/27/15 1210  Weight: 208 lb 12.8 oz (94.711 kg)    Physical Exam  Constitutional: He is oriented to person, place, and time and well-developed, well-nourished, and in no distress.  HENT:  Head: Normocephalic and atraumatic.  Nose: Nose normal.  Mouth/Throat: Oropharynx is clear and moist. No oropharyngeal exudate.  Eyes: Conjunctivae and EOM are normal. Pupils are equal, round, and reactive to light. Right eye exhibits no discharge. Left eye exhibits no discharge. No scleral icterus.  Neck: Normal range of motion. Neck supple. No tracheal deviation present. No thyromegaly present.  Cardiovascular: Normal rate, regular rhythm and normal heart sounds.  Exam reveals no gallop and no friction  rub.   No murmur heard. Pulmonary/Chest: Effort normal and breath sounds normal. He has no wheezes. He has no rales.  Abdominal: Soft. Bowel sounds are normal. He exhibits no distension and no mass. There is no tenderness. There is no rebound and no guarding.  well-healing surgical incision sites  Genitourinary:  Musculoskeletal: Normal range of motion. He exhibits no edema.  Lymphadenopathy:    He has no cervical adenopathy.  Neurological: He is alert and oriented to person, place, and time. He has normal reflexes. No cranial nerve deficit. Gait normal. Coordination normal.  Skin: Skin is warm and dry. No rash noted.  Psychiatric: Mood, memory, affect and judgment normal.  Nursing note and vitals reviewed.   LABORATORY DATA:  I have reviewed the data as listed Results for JAKHI, DISHMAN (MRN [id number]) as of 07/31/2015 18:58  Ref. Range 07/07/2015 05:59  Sodium Latest Ref Range: 135-145 mmol/L 137  Potassium Latest Ref Range: 3.5-5.1 mmol/L 4.0  Chloride Latest Ref Range: 101-111 mmol/L 102  CO2 Latest Ref Range: 22-32 mmol/L 27  BUN Latest Ref Range: 6-20 mg/dL 12  Creatinine Latest Ref Range: 0.61-1.24 mg/dL 1.09  Calcium Latest Ref Range: 8.9-10.3 mg/dL 9.3  EGFR (Non-African Amer.) Latest Ref Range: >60 mL/min >60  EGFR (African American) Latest Ref Range: >60 mL/min >60  Glucose Latest Ref Range: 65-99 mg/dL 107 (H)  Anion gap Latest Ref Range: 5-15  8  WBC Latest Ref Range: 4.0-10.5 K/uL 9.2  RBC Latest Ref Range: 4.22-5.81 MIL/uL 4.55  Hemoglobin Latest Ref Range: 13.0-17.0 g/dL 12.8 (L)  HCT Latest Ref Range: 39.0-52.0 % 39.2  MCV Latest Ref Range: 78.0-100.0 fL 86.2  MCH Latest Ref Range: 26.0-34.0 pg 28.1  MCHC Latest Ref Range: 30.0-36.0 g/dL 32.7  RDW Latest Ref Range: 11.5-15.5 % 12.5  Platelets Latest Ref Range: 150-400 K/uL 150   PATHOLOGY:REPORT OF SURGICAL PATHOLOGY ADDITIONAL INFORMATION: 1. Mismatch Repair (MMR) Protein Immunohistochemistry (IHC) IHC  Expression Result: MLH1: Preserved nuclear expression (greater 50% tumor expression) MSH2: Preserved nuclear expression (greater 50% tumor expression) MSH6: Preserved nuclear expression (greater 50% tumor expression) PMS2: Preserved nuclear expression (greater 50% tumor expression) * Internal control demonstrates intact nuclear expression Interpretation: NORMAL There is preserved expression of the major and minor MMR proteins. There is a very  low probability that microsatellite instability (MSI) is present. However, certain clinically significant MMR protein mutations may result in preservation of nuclear expression. It is recommended that the preservation of protein expression be correlated with molecular based MSI testing. References: 1. Guidelines on Genetic Evaluation and Management of Lynch Syndrome: A Consensus Statement by the [place] Multi-Society Task Force on Colorectal Cancer Gae Dry. Sherlie Ban , MD, and others . Am Nicki [place] 2014; [phone]; doi: 10.1038/ajg.2014.186; published online 09 June 2013 2. Outcomes of screening endometrial cancer patients for Lynch syndrome by patient-administered checklist. Olena Heckle MS, and others. Gynecol Oncol 2013;131(3):619-623. Mali RUND DO Pathologist, Electronic Signature ( Signed 07/11/2015) FINAL DIAGNOSIS Diagnosis 1. Colon, segmental resection for tumor, rectosigmoid - INVASIVE ADENOCARCINOMA, SEE COMMENT. - TUMOR INVADES INTO SUBMUCOSA. 1 of 4 FINAL for FOTIOS, AMOS A [phone]) Diagnosis(continued) - ONE LYMPH NODE, POSITIVE FOR METASTATIC TUMOR (1/14). - SURGICAL MARGINS, NEGATIVE FOR TUMOR. - SEE TUMOR SYNOPTIC TEMPLATE BELOW. 2. Colon, resection margin (donut), final distal margin - BENIGN COLON. - NEGATIVE FOR DYSPLASIA OR MALIGNANCY. Microscopic Comment 1. COLON AND RECTUM (INCLUDING TRANS-ANAL RESECTION): Specimen: Rectosigmoid Procedure: Resection Tumor site: Mid rectum Specimen integrity: Intact Macroscopic  intactness of mesorectum: Near complete Macroscopic tumor perforation: Absent Invasive tumor: Maximum size: 2.0 cm Histologic type(s): Adenocarcinoma Histologic grade and differentiation: G2: moderately differentiated/low grade Type of polyp in which invasive carcinoma arose: Adenoma with high grade dysplasia Microscopic extension of invasive tumor: Tumor extends into submucosa and is immediately adjacent to muscularis propria. Lymph-Vascular invasion: Present Peri-neural invasion: Present Tumor deposit(s) (discontinuous extramural extension): Absent Resection margins: Proximal margin: Negative Distal margin: Negative Circumferential (radial) (posterior ascending, posterior descending; lateral and posterior mid-rectum; and entire lower 1/3 rectum): Negative Mesenteric margin (sigmoid and transverse): Negative Distance closest margin (if all above margins negative): 1.0 cm (distal) Treatment effect (neo-adjuvant therapy): None Additional polyp(s): None Non-neoplastic findings: None Lymph nodes: number examined 14; number positive: 1 Pathologic Staging: pT1, pN1a, pMX Ancillary studies: Per the [place] Gastrointestinal Oncology Working group Guidelines, tumor will be submitted for both microsatellite instability by PCR and mismatch repair protein expression by immunohistochemistry. The results will be reported in an addendum. (CR:kh 07-08-15) Mali RUND DO Pathologist, Electronic Signature (Case signed 07/08/2015) Specimen Gross and Clinical Information 2 of 4 FINAL for NASHAWN, HILLOCK A [id number]) Specimen(s) Obtained: 1. Colon, segmental resection for tumor, rectosigmoid 2. Colon, resection margin (donut), final distal margin Specimen Clinical Information 1. rectal cancer (kp) Gross 1. Specimen: Rectosigmoid, to include at least middle third of rectum. Specimen integrity: Intact. Specimen length: 31 cm. Mesorectal intactness: Near complete. Tumor location: Middle third of  rectum, posterior wall, 6.0 cm from the sigmorectal junction. Tumor size: 2.0 cm in diameter tan red indurated, sessile ill-defined mass. Percent of bowel circumference involved: Approximately 20%. Tumor distance to margins: Proximal: 28 cm. Distal: 1 cm. Mesenteric (sigmoid and transverse): 11 cm. Radial (posterior ascending, posterior descending; lateral and posterior mid-rectum; and entire lower 1/3 rectum): 0.5 cm to the mesorectal soft tissue margin. Macroscopic extent of tumor invasion: The tumor involves, but does not grossly completely transect the muscularis. Total presumed lymph nodes: 14 possible lymph nodes ranging from 0.1 to 0.8 cm. Extramural satellite tumor nodules: None. Mucosal polyp(s): None. Additional findings: None. Block summary: A = shave of proximal margin B,C = shave of entire distal margin D-H = tumor, entirely submitted I = four nodes, whole J = four nodes, whole K = four nodes, whole L = two nodes, whole Total: 12 blocks submitted 2. Received in  formalin is a 1.1 cm in diameter and 1.3 cm thick portion of tissue with tan to hyperemic, smooth mucosa on one end. Sectioned and entirely submitted in one block. (SW:ds 07/07/15) Stain(s) used in Diagnosis: The following stain(s) were used in diagnosing the case: MSH6, MLH1, MSH2, PMS2. The control(s) stained appropriately. Disclaimer Some of these immunohistochemical stains may have been developed and the performance characteristics determined by [facility]. Some may not have been cleared or approved by the U.S. Food and Drug Administration. The FDA has determined that such clearance or approval is not necessary. This test is used for clinical purposes. It should not be regarded as investigational or for research. This laboratory is certified under the [place] (CLIA-88) as qualified to perform high complexity clinical laboratory testing. Report signed out  from the following location(s) Technical Component was performed at [facility]. [address],[place] [address].[id number]., 3 of 4 FINAL for ARLIN, SASS A [phone]) Report signed out from the following location(s)(continued) Technical Component was performed at [facility], [place], [place] [address]. CLIA #: [id number], Technical component and interpretation was performed at [facility], [place], [place] [address]. CLIA #: [id number], 4 of 4    ASSESSMENT & PLAN:  T1, N1, M0 adenocarcinoma of the rectum, Stage IIIA Robotic assisted LAR with Dr. [name] Normal preoperative CEA  I reviewed Mr. Grissett pathology report with him today, and informed him of the increase in his cancer staging due to lymph node involvement. Additional treatment in the form of chemotherapy and radiation are recommended for adjuvant therapy. We spent time today reviewing the N CCN guidelines. We discussed port placement to administer his chemotherapy. He is agreeable and we will contact Dr. [name].  I advised him we can begin treatment with FOLFOX and started radiation with concurrent 5-FU after several cycles. I advised in the total duration of therapy is 6 months. We reviewed the statistics of cure with and without adjuvant therapy for his stage of rectal cancer. He is very interested in proceeding. We briefly addressed side effects of treatment including but not limited to blood count abnormalities, increased risk of infection, neuropathy, fatigue, nausea and vomiting. We will arrange for formal chemotherapy teaching. The patient has met Hildred Alamin our patient navigator today as well.  He needs a refill on his oxycodone today.  All questions were answered. The patient knows to call the clinic with any problems, questions or concerns.   This document serves as a record of services personally performed  by Ancil Linsey, MD. It was created on her behalf by Toni Amend, a trained medical scribe. The creation of this record is based on the scribe's personal observations and the provider's statements to them. This document has been checked and approved by the attending provider.  I have reviewed the above documentation for accuracy and completeness, and I agree with the above.  This note was electronically signed.  Kelby Fam. Whitney Muse, MD

## 2015-07-28 MED ORDER — PROCHLORPERAZINE MALEATE 10 MG PO TABS: 10.0000 mg | ORAL_TABLET | Freq: Four times a day (QID) | ORAL | Status: DC | PRN

## 2015-07-28 MED ORDER — ONDANSETRON HCL 8 MG PO TABS: 8.0000 mg | ORAL_TABLET | Freq: Three times a day (TID) | ORAL | Status: DC | PRN

## 2015-07-28 MED ORDER — LIDOCAINE-PRILOCAINE 2.5-2.5 % EX CREA
TOPICAL_CREAM | CUTANEOUS | Status: DC
Start: 2015-07-28 — End: 2016-06-27

## 2015-07-28 NOTE — Patient Instructions (Signed)
[place]   CHEMOTHERAPY INSTRUCTIONS  Premeds: Zofran - for nausea/vomiting prevention/reduction. Dexamethasone - steroid - given to reduce the risk of you having an allergic type reaction to the chemotherapy. Dex can cause you to feel energized, nervous/anxious/jittery, make you have trouble sleeping, and/or make you feel hot/flushed in the face/neck and/or look pink/red in the face/neck. These side effects will pass as the Dex wears off. (takes 20 minutes to infuse)  Oxaliplatin - anaphylactic reaction, neurotoxicity (i.e., headache, fatigue, difficulty sleeping, pain). Peripheral neuropathy (numbness/tingling/burning in hands/fingers/feet/toes) - will be aggravated by cold/cool temperatures. We need to know when you develop peripheral neuropathy so that we can monitor it and treat if necessary. Nausea/vomiting, diarrhea, bone marrow suppression (lowers white blood cells (fight infection), lowers red blood cells (make up your blood), lowers platelets (help blood to clot). Pulmonary fibrosis. Once you have received Oxaliplatin do NOT eat or drinking anything cold/cool for 5-10 days! Do NOT breathe in cold/cool air and do NOT touch anything cold for 5-10 days. The time frame varies from patient to patient on the length of time you must abstain from the above mentioned. Best advice is to wait at least 5 days before attempting to reintroduce cold/cool back into life. Slowly reintroduce cool/cold things! Wear gloves when getting items out of the refrigerator (of course, these would be things you are going to heat to eat)!      (takes 2 hours to infuse)  Leucovorin - this is a medication that is not chemo but given with chemo. This med "rescues" the healthy cells before we administer the drug 5FU. This makes the 5FU work better. (takes 2 hours to infuse- this goes in @ the same time as the Oxaliplatin chemo)  5FU: bone marrow suppression (low white blood cells - wbcs fight  infection, low red blood cells - rbcs make up your blood, low platelets - this is what makes your blood clot, nausea/vomiting, diarrhea, mouth sores, hair loss, dry skin, ocular toxicities (increased tear production, sensitivity to light). You must wear sunscreen/sunglasses. Cover your skin when out in sunlight. You will get burned very easily. The nurse will sit @ your bedside and push this medication in via a syringe. This will take 10 minutes. Then she will connect you to an ambulatory pump with this medication attached. You will wear the pump for 46 hours. The 5FU chemo will infuse for 46 hours total. Then the pump will be removed).   POTENTIAL SIDE EFFECTS OF TREATMENT: Increased Susceptibility to Infection, Vomiting, Constipation, Hair Thinning, Changes in Character of Skin and Nails (brittleness, dryness,etc.), Bone Marrow Suppression, Abdominal Cramping, Nausea, Diarrhea, Sun Sensitivity and Mouth Sores    EDUCATIONAL MATERIALS GIVEN AND REVIEWED: Chemotherapy and You Book Specific Instructions Sheets: Oxaliplatin, Leucovorin, 5FU, Zofran, Dexamethasone, Compazine, EMLA cream   SELF CARE ACTIVITIES WHILE ON CHEMOTHERAPY: Increase your fluid intake 48 hours prior to treatment and drink at least 2 quarts per day after treatment., No alcohol intake., No aspirin or other medications unless approved by your oncologist., Eat foods that are light and easy to digest., No fried, fatty, or spicy foods immediately before or after treatment., Have teeth cleaned professionally before starting treatment. Keep dentures and partial plates clean., Use soft toothbrush and do not use mouthwashes that contain alcohol. Biotene is a good mouthwash that is available at most pharmacies or may be ordered by calling [phone]., Use warm salt water gargles (1 teaspoon salt per 1 quart warm water) before  and after meals and at bedtime. Or you may rinse with 2 tablespoons of three -percent hydrogen peroxide mixed in  eight ounces of water., Always use sunscreen with SPF (Sun Protection Factor) of 30 or higher., Use your nausea medication as directed to prevent nausea., Use your stool softener or laxative as directed to prevent constipation. and Use your anti-diarrheal medication as directed to stop diarrhea.  Please wash your hands for at least 30 seconds using warm soapy water. Handwashing is the #1 way to prevent the spread of germs. Stay away from sick people or people who are getting over a cold. If you develop respiratory systems such as green/yellow mucus production or productive cough or persistent cough let us know and we will see if you need an antibiotic. It is a good idea to keep a pair of gloves on when going into grocery stores/[employer] to decrease your risk of coming into contact with germs on the carts, etc. Carry alcohol hand gel with you at all times and use it frequently if out in public. All foods need to be cooked thoroughly. No raw foods. No medium or undercooked meats, eggs. If your food is cooked medium well, it does not need to be hot pink or saturated with bloody liquid at all. Vegetables and fruits need to be washed/rinsed under the faucet with a dish detergent before being consumed. You can eat raw fruits and vegetables unless we tell you otherwise but it would be best if you cooked them or bought frozen. Do not eat off of salad bars or hot bars unless you really trust the cleanliness of the restaurant. If you need dental work, please let Dr. Whitney Muse know before you go for your appointment so that we can coordinate the best possible time for you in regards to your chemo regimen. You need to also let your dentist know that you are actively taking chemo. We may need to do labs prior to your dental appointment. We also want your bowels moving at least every other day. If this is not happening, we need to know so that we can get you on a bowel regimen to help you go.    MEDICATIONS: You have been  given prescriptions for the following medications:  Zofran 8mg  tablet. Take 1 tablet every 8 hours as needed for nausea/vomiting. (#1 nausea med to take, this can constipate)  Compazine 10mg  tablet. Take 1 tablet every 6 hours as needed for nausea/vomiting. (#2 nausea med to take, this can make you sleepy)  EMLA cream. Apply a quarter size amount to port site 1 hour prior to chemo. Do not rub in. Cover with plastic wrap.   Over-the-Counter Meds:  Miralax 1 capful in 8 oz of fluid daily. May increase to two times a day if needed. This is a stool softener. If this doesn't work proceed you can add:  Senokot S  - start with 1 tablet two times a day and increase to 4 tablets two times a day if needed. (total of 8 tablets in a 24 hour period). This is a stimulant laxative.   Call us if this does not help your bowels move.   Imodium 2mg  capsule. Take 2 capsules after the 1st loose stool and then 1 capsule every 2 hours until you go a total of 12 hours without having a loose stool. Call the [facility] if loose stools continue.   SYMPTOMS TO REPORT AS SOON AS POSSIBLE AFTER TREATMENT:  FEVER GREATER THAN 100.5 F  CHILLS WITH OR WITHOUT FEVER  NAUSEA AND VOMITING THAT IS NOT CONTROLLED WITH YOUR NAUSEA MEDICATION  UNUSUAL SHORTNESS OF BREATH  UNUSUAL BRUISING OR BLEEDING  TENDERNESS IN MOUTH AND THROAT WITH OR WITHOUT PRESENCE OF ULCERS  URINARY PROBLEMS  BOWEL PROBLEMS  UNUSUAL RASH    Wear comfortable clothing and clothing appropriate for easy access to any Portacath or PICC line. Let us know if there is anything that we can do to make your therapy better!      I have been informed and understand all of the instructions given to me and have received a copy. I have been instructed to call the clinic [phone] or my family physician as soon as possible for continued medical care, if indicated. I do not have any more questions at this time but understand that I may call  the [facility] or the Patient Navigator at [phone] during office hours should I have questions or need assistance in obtaining follow-up care.                    Oxaliplatin Injection What is this medicine? OXALIPLATIN (ox AL i PLA tin) is a chemotherapy drug. It targets fast dividing cells, like cancer cells, and causes these cells to die. This medicine is used to treat cancers of the colon and rectum, and many other cancers. This medicine may be used for other purposes; ask your health care provider or pharmacist if you have questions. COMMON BRAND NAME(S): Eloxatin What should I tell my health care provider before I take this medicine? They need to know if you have any of these conditions: -kidney disease -an unusual or allergic reaction to oxaliplatin, other chemotherapy, other medicines, foods, dyes, or preservatives -pregnant or trying to get pregnant -breast-feeding How should I use this medicine? This drug is given as an infusion into a vein. It is administered in a hospital or clinic by a specially trained health care professional. Talk to your pediatrician regarding the use of this medicine in children. Special care may be needed. Overdosage: If you think you have taken too much of this medicine contact a poison control center or emergency room at once. NOTE: This medicine is only for you. Do not share this medicine with others. What if I miss a dose? It is important not to miss a dose. Call your doctor or health care professional if you are unable to keep an appointment. What may interact with this medicine? -medicines to increase blood counts like filgrastim, pegfilgrastim, sargramostim -probenecid -some antibiotics like amikacin, gentamicin, neomycin, polymyxin B, streptomycin, tobramycin -zalcitabine Talk to your doctor or health care professional before taking any of these  medicines: -acetaminophen -aspirin -ibuprofen -ketoprofen -naproxen This list may not describe all possible interactions. Give your health care provider a list of all the medicines, herbs, non-prescription drugs, or dietary supplements you use. Also tell them if you smoke, drink alcohol, or use illegal drugs. Some items may interact with your medicine. What should I watch for while using this medicine? Your condition will be monitored carefully while you are receiving this medicine. You will need important blood work done while you are taking this medicine. This medicine can make you more sensitive to cold. Do not drink cold drinks or use ice. Cover exposed skin before coming in contact with cold temperatures or cold objects. When out in cold weather wear warm clothing and cover your mouth and nose to warm the air that goes into your lungs. Tell  your doctor if you get sensitive to the cold. This drug may make you feel generally unwell. This is not uncommon, as chemotherapy can affect healthy cells as well as cancer cells. Report any side effects. Continue your course of treatment even though you feel ill unless your doctor tells you to stop. In some cases, you may be given additional medicines to help with side effects. Follow all directions for their use. Call your doctor or health care professional for advice if you get a fever, chills or sore throat, or other symptoms of a cold or flu. Do not treat yourself. This drug decreases your body's ability to fight infections. Try to avoid being around people who are sick. This medicine may increase your risk to bruise or bleed. Call your doctor or health care professional if you notice any unusual bleeding. Be careful brushing and flossing your teeth or using a toothpick because you may get an infection or bleed more easily. If you have any dental work done, tell your dentist you are receiving this medicine. Avoid taking products that contain aspirin,  acetaminophen, ibuprofen, naproxen, or ketoprofen unless instructed by your doctor. These medicines may hide a fever. Do not become pregnant while taking this medicine. Women should inform their doctor if they wish to become pregnant or think they might be pregnant. There is a potential for serious side effects to an unborn child. Talk to your health care professional or pharmacist for more information. Do not breast-feed an infant while taking this medicine. Call your doctor or health care professional if you get diarrhea. Do not treat yourself. What side effects may I notice from receiving this medicine? Side effects that you should report to your doctor or health care professional as soon as possible: -allergic reactions like skin rash, itching or hives, swelling of the face, lips, or tongue -low blood counts - This drug may decrease the number of white blood cells, red blood cells and platelets. You may be at increased risk for infections and bleeding. -signs of infection - fever or chills, cough, sore throat, pain or difficulty passing urine -signs of decreased platelets or bleeding - bruising, pinpoint red spots on the skin, black, tarry stools, nosebleeds -signs of decreased red blood cells - unusually weak or tired, fainting spells, lightheadedness -breathing problems -chest pain, pressure -cough -diarrhea -jaw tightness -mouth sores -nausea and vomiting -pain, swelling, redness or irritation at the injection site -pain, tingling, numbness in the hands or feet -problems with balance, talking, walking -redness, blistering, peeling or loosening of the skin, including inside the mouth -trouble passing urine or change in the amount of urine Side effects that usually do not require medical attention (report to your doctor or health care professional if they continue or are bothersome): -changes in vision -constipation -hair loss -loss of appetite -metallic taste in the mouth or changes  in taste -stomach pain This list may not describe all possible side effects. Call your doctor for medical advice about side effects. You may report side effects to FDA at 1-800-FDA-1088. Where should I keep my medicine? This drug is given in a hospital or clinic and will not be stored at home. NOTE: This sheet is a summary. It may not cover all possible information. If you have questions about this medicine, talk to your doctor, pharmacist, or health care provider.  2015, [employer]. (2008-06-01 17:22:47) Leucovorin injection What is this medicine? LEUCOVORIN (loo koe VOR in) is used to prevent or treat the harmful effects of  some medicines. This medicine is used to treat anemia caused by a low amount of folic acid in the body. It is also used with 5-fluorouracil (5-FU) to treat colon cancer. This medicine may be used for other purposes; ask your health care provider or pharmacist if you have questions. What should I tell my health care provider before I take this medicine? They need to know if you have any of these conditions: -anemia from low levels of vitamin B-12 in the blood -an unusual or allergic reaction to leucovorin, folic acid, other medicines, foods, dyes, or preservatives -pregnant or trying to get pregnant -breast-feeding How should I use this medicine? This medicine is for injection into a muscle or into a vein. It is given by a health care professional in a hospital or clinic setting. Talk to your pediatrician regarding the use of this medicine in children. Special care may be needed. Overdosage: If you think you have taken too much of this medicine contact a poison control center or emergency room at once. NOTE: This medicine is only for you. Do not share this medicine with others. What if I miss a dose? This does not apply. What may interact with this  medicine? -capecitabine -fluorouracil -phenobarbital -phenytoin -primidone -trimethoprim-sulfamethoxazole This list may not describe all possible interactions. Give your health care provider a list of all the medicines, herbs, non-prescription drugs, or dietary supplements you use. Also tell them if you smoke, drink alcohol, or use illegal drugs. Some items may interact with your medicine. What should I watch for while using this medicine? Your condition will be monitored carefully while you are receiving this medicine. This medicine may increase the side effects of 5-fluorouracil, 5-FU. Tell your doctor or health care professional if you have diarrhea or mouth sores that do not get better or that get worse. What side effects may I notice from receiving this medicine? Side effects that you should report to your doctor or health care professional as soon as possible: -allergic reactions like skin rash, itching or hives, swelling of the face, lips, or tongue -breathing problems -fever, infection -mouth sores -unusual bleeding or bruising -unusually weak or tired Side effects that usually do not require medical attention (report to your doctor or health care professional if they continue or are bothersome): -constipation or diarrhea -loss of appetite -nausea, vomiting This list may not describe all possible side effects. Call your doctor for medical advice about side effects. You may report side effects to FDA at 1-800-FDA-1088. Where should I keep my medicine? This drug is given in a hospital or clinic and will not be stored at home. NOTE: This sheet is a summary. It may not cover all possible information. If you have questions about this medicine, talk to your doctor, pharmacist, or health care provider.  2015, [employer] Standard. (2008-05-11 16:50:29) Fluorouracil, 5-FU injection What is this medicine? FLUOROURACIL, 5-FU (flure oh YOOR a sil) is a chemotherapy drug. It slows the  growth of cancer cells. This medicine is used to treat many types of cancer like breast cancer, colon or rectal cancer, pancreatic cancer, and stomach cancer. This medicine may be used for other purposes; ask your health care provider or pharmacist if you have questions. COMMON BRAND NAME(S): Adrucil What should I tell my health care provider before I take this medicine? They need to know if you have any of these conditions: -blood disorders -dihydropyrimidine dehydrogenase (DPD) deficiency -infection (especially a virus infection such as chickenpox, cold sores, or herpes) -kidney disease -  liver disease -malnourished, poor nutrition -recent or ongoing radiation therapy -an unusual or allergic reaction to fluorouracil, other chemotherapy, other medicines, foods, dyes, or preservatives -pregnant or trying to get pregnant -breast-feeding How should I use this medicine? This drug is given as an infusion or injection into a vein. It is administered in a hospital or clinic by a specially trained health care professional. Talk to your pediatrician regarding the use of this medicine in children. Special care may be needed. Overdosage: If you think you have taken too much of this medicine contact a poison control center or emergency room at once. NOTE: This medicine is only for you. Do not share this medicine with others. What if I miss a dose? It is important not to miss your dose. Call your doctor or health care professional if you are unable to keep an appointment. What may interact with this medicine? -allopurinol -cimetidine -dapsone -digoxin -hydroxyurea -leucovorin -levamisole -medicines for seizures like ethotoin, fosphenytoin, phenytoin -medicines to increase blood counts like filgrastim, pegfilgrastim, sargramostim -medicines that treat or prevent blood clots like warfarin, enoxaparin, and dalteparin -methotrexate -metronidazole -pyrimethamine -some other chemotherapy drugs like  busulfan, cisplatin, estramustine, vinblastine -trimethoprim -trimetrexate -vaccines Talk to your doctor or health care professional before taking any of these medicines: -acetaminophen -aspirin -ibuprofen -ketoprofen -naproxen This list may not describe all possible interactions. Give your health care provider a list of all the medicines, herbs, non-prescription drugs, or dietary supplements you use. Also tell them if you smoke, drink alcohol, or use illegal drugs. Some items may interact with your medicine. What should I watch for while using this medicine? Visit your doctor for checks on your progress. This drug may make you feel generally unwell. This is not uncommon, as chemotherapy can affect healthy cells as well as cancer cells. Report any side effects. Continue your course of treatment even though you feel ill unless your doctor tells you to stop. In some cases, you may be given additional medicines to help with side effects. Follow all directions for their use. Call your doctor or health care professional for advice if you get a fever, chills or sore throat, or other symptoms of a cold or flu. Do not treat yourself. This drug decreases your body's ability to fight infections. Try to avoid being around people who are sick. This medicine may increase your risk to bruise or bleed. Call your doctor or health care professional if you notice any unusual bleeding. Be careful brushing and flossing your teeth or using a toothpick because you may get an infection or bleed more easily. If you have any dental work done, tell your dentist you are receiving this medicine. Avoid taking products that contain aspirin, acetaminophen, ibuprofen, naproxen, or ketoprofen unless instructed by your doctor. These medicines may hide a fever. Do not become pregnant while taking this medicine. Women should inform their doctor if they wish to become pregnant or think they might be pregnant. There is a potential for  serious side effects to an unborn child. Talk to your health care professional or pharmacist for more information. Do not breast-feed an infant while taking this medicine. Men should inform their doctor if they wish to father a child. This medicine may lower sperm counts. Do not treat diarrhea with over the counter products. Contact your doctor if you have diarrhea that lasts more than 2 days or if it is severe and watery. This medicine can make you more sensitive to the sun. Keep out of the sun. If  you cannot avoid being in the sun, wear protective clothing and use sunscreen. Do not use sun lamps or tanning beds/booths. What side effects may I notice from receiving this medicine? Side effects that you should report to your doctor or health care professional as soon as possible: -allergic reactions like skin rash, itching or hives, swelling of the face, lips, or tongue -low blood counts - this medicine may decrease the number of white blood cells, red blood cells and platelets. You may be at increased risk for infections and bleeding. -signs of infection - fever or chills, cough, sore throat, pain or difficulty passing urine -signs of decreased platelets or bleeding - bruising, pinpoint red spots on the skin, black, tarry stools, blood in the urine -signs of decreased red blood cells - unusually weak or tired, fainting spells, lightheadedness -breathing problems -changes in vision -chest pain -mouth sores -nausea and vomiting -pain, swelling, redness at site where injected -pain, tingling, numbness in the hands or feet -redness, swelling, or sores on hands or feet -stomach pain -unusual bleeding Side effects that usually do not require medical attention (report to your doctor or health care professional if they continue or are bothersome): -changes in finger or toe nails -diarrhea -dry or itchy skin -hair loss -headache -loss of appetite -sensitivity of eyes to the light -stomach  upset -unusually teary eyes This list may not describe all possible side effects. Call your doctor for medical advice about side effects. You may report side effects to FDA at 1-800-FDA-1088. Where should I keep my medicine? This drug is given in a hospital or clinic and will not be stored at home. NOTE: This sheet is a summary. It may not cover all possible information. If you have questions about this medicine, talk to your doctor, pharmacist, or health care provider.  2015, [employer]. (2008-03-10 13:53:16) Ondansetron injection What is this medicine? ONDANSETRON (on DAN se tron) is used to treat nausea and vomiting caused by chemotherapy. It is also used to prevent or treat nausea and vomiting after surgery. This medicine may be used for other purposes; ask your health care provider or pharmacist if you have questions. COMMON BRAND NAME(S): Zofran What should I tell my health care provider before I take this medicine? They need to know if you have any of these conditions: -heart disease -history of irregular heartbeat -liver disease -low levels of magnesium or potassium in the blood -an unusual or allergic reaction to ondansetron, granisetron, other medicines, foods, dyes, or preservatives -pregnant or trying to get pregnant -breast-feeding How should I use this medicine? This medicine is for infusion into a vein. It is given by a health care professional in a hospital or clinic setting. Talk to your pediatrician regarding the use of this medicine in children. Special care may be needed. Overdosage: If you think you have taken too much of this medicine contact a poison control center or emergency room at once. NOTE: This medicine is only for you. Do not share this medicine with others. What if I miss a dose? This does not apply. What may interact with this medicine? Do not take this medicine with any of the following medications: -apomorphine -certain medicines for  fungal infections like fluconazole, itraconazole, ketoconazole, posaconazole, voriconazole -cisapride -dofetilide -dronedarone -pimozide -thioridazine -ziprasidone This medicine may also interact with the following medications: -carbamazepine -certain medicines for depression, anxiety, or psychotic disturbances -fentanyl -linezolid -MAOIs like Carbex, Eldepryl, Marplan, Nardil, and Parnate -methylene blue (injected into a vein) -other medicines that  prolong the QT interval (cause an abnormal heart rhythm) -phenytoin -rifampicin -tramadol This list may not describe all possible interactions. Give your health care provider a list of all the medicines, herbs, non-prescription drugs, or dietary supplements you use. Also tell them if you smoke, drink alcohol, or use illegal drugs. Some items may interact with your medicine. What should I watch for while using this medicine? Your condition will be monitored carefully while you are receiving this medicine. What side effects may I notice from receiving this medicine? Side effects that you should report to your doctor or health care professional as soon as possible: -allergic reactions like skin rash, itching or hives, swelling of the face, lips, or tongue -breathing problems -confusion -dizziness -fast or irregular heartbeat -feeling faint or lightheaded, falls -fever and chills -loss of balance or coordination -seizures -sweating -swelling of the hands and feet -tightness in the chest -tremors -unusually weak or tired Side effects that usually do not require medical attention (report to your doctor or health care professional if they continue or are bothersome): -constipation or diarrhea -headache This list may not describe all possible side effects. Call your doctor for medical advice about side effects. You may report side effects to FDA at 1-800-FDA-1088. Where should I keep my medicine? This drug is given in a hospital or  clinic and will not be stored at home. NOTE: This sheet is a summary. It may not cover all possible information. If you have questions about this medicine, talk to your doctor, pharmacist, or health care provider.  2015, [employer]. (2013-08-12 16:18:28) Dexamethasone injection What is this medicine? DEXAMETHASONE (dex a METH a sone) is a corticosteroid. It is used to treat inflammation of the skin, joints, lungs, and other organs. Common conditions treated include asthma, allergies, and arthritis. It is also used for other conditions, like blood disorders and diseases of the adrenal glands. This medicine may be used for other purposes; ask your health care provider or pharmacist if you have questions. COMMON BRAND NAME(S): Decadron, Solurex What should I tell my health care provider before I take this medicine? They need to know if you have any of these conditions: -blood clotting problems -Cushing's syndrome -diabetes -glaucoma -heart problems or disease -high blood pressure -infection like herpes, measles, tuberculosis, or chickenpox -kidney disease -liver disease -mental problems -myasthenia gravis -osteoporosis -previous heart attack -seizures -stomach, ulcer or intestine disease including colitis and diverticulitis -thyroid problem -an unusual or allergic reaction to dexamethasone, corticosteroids, other medicines, lactose, foods, dyes, or preservatives -pregnant or trying to get pregnant -breast-feeding How should I use this medicine? This medicine is for injection into a muscle, joint, lesion, soft tissue, or vein. It is given by a health care professional in a hospital or clinic setting. Talk to your pediatrician regarding the use of this medicine in children. Special care may be needed. Overdosage: If you think you have taken too much of this medicine contact a poison control center or emergency room at once. NOTE: This medicine is only for you. Do not share  this medicine with others. What if I miss a dose? This may not apply. If you are having a series of injections over a prolonged period, try not to miss an appointment. Call your doctor or health care professional to reschedule if you are unable to keep an appointment. What may interact with this medicine? Do not take this medicine with any of the following medications: -mifepristone, RU-486 -vaccines This medicine may also interact with the following  medications: -amphotericin B -antibiotics like clarithromycin, erythromycin, and troleandomycin -aspirin and aspirin-like drugs -barbiturates like phenobarbital -carbamazepine -cholestyramine -cholinesterase inhibitors like donepezil, galantamine, rivastigmine, and tacrine -cyclosporine -digoxin -diuretics -ephedrine -male hormones, like estrogens or progestins and birth control pills -indinavir -isoniazid -ketoconazole -medicines for diabetes -medicines that improve muscle tone or strength for conditions like myasthenia gravis -NSAIDs, medicines for pain and inflammation, like ibuprofen or naproxen -phenytoin -rifampin -thalidomide -warfarin This list may not describe all possible interactions. Give your health care provider a list of all the medicines, herbs, non-prescription drugs, or dietary supplements you use. Also tell them if you smoke, drink alcohol, or use illegal drugs. Some items may interact with your medicine. What should I watch for while using this medicine? Your condition will be monitored carefully while you are receiving this medicine. If you are taking this medicine for a long time, carry an identification card with your name and address, the type and dose of your medicine, and your doctor's name and address. This medicine may increase your risk of getting an infection. Stay away from people who are sick. Tell your doctor or health care professional if you are around anyone with measles or chickenpox. Talk to your  health care provider before you get any vaccines that you take this medicine. If you are going to have surgery, tell your doctor or health care professional that you have taken this medicine within the last twelve months. Ask your doctor or health care professional about your diet. You may need to lower the amount of salt you eat. The medicine can increase your blood sugar. If you are a diabetic check with your doctor if you need help adjusting the dose of your diabetic medicine. What side effects may I notice from receiving this medicine? Side effects that you should report to your doctor or health care professional as soon as possible: -allergic reactions like skin rash, itching or hives, swelling of the face, lips, or tongue -black or tarry stools -change in the amount of urine -changes in vision -confusion, excitement, restlessness, a false sense of well-being -fever, sore throat, sneezing, cough, or other signs of infection, wounds that will not heal -hallucinations -increased thirst -mental depression, mood swings, mistaken feelings of self importance or of being mistreated -pain in hips, back, ribs, arms, shoulders, or legs -pain, redness, or irritation at the injection site -redness, blistering, peeling or loosening of the skin, including inside the mouth -rounding out of face -swelling of feet or lower legs -unusual bleeding or bruising -unusual tired or weak -wounds that do not heal Side effects that usually do not require medical attention (report to your doctor or health care professional if they continue or are bothersome): -diarrhea or constipation -change in taste -headache -nausea, vomiting -skin problems, acne, thin and shiny skin -touble sleeping -unusual growth of hair on the face or body -weight gain This list may not describe all possible side effects. Call your doctor for medical advice about side effects. You may report side effects to FDA at  1-800-FDA-1088. Where should I keep my medicine? This drug is given in a hospital or clinic and will not be stored at home. NOTE: This sheet is a summary. It may not cover all possible information. If you have questions about this medicine, talk to your doctor, pharmacist, or health care provider.  2015, [employer]. (2008-02-26 14:04:12) Dexamethasone injection What is this medicine? DEXAMETHASONE (dex a METH a sone) is a corticosteroid. It is used to treat inflammation of the  skin, joints, lungs, and other organs. Common conditions treated include asthma, allergies, and arthritis. It is also used for other conditions, like blood disorders and diseases of the adrenal glands. This medicine may be used for other purposes; ask your health care provider or pharmacist if you have questions. COMMON BRAND NAME(S): Decadron, Solurex What should I tell my health care provider before I take this medicine? They need to know if you have any of these conditions: -blood clotting problems -Cushing's syndrome -diabetes -glaucoma -heart problems or disease -high blood pressure -infection like herpes, measles, tuberculosis, or chickenpox -kidney disease -liver disease -mental problems -myasthenia gravis -osteoporosis -previous heart attack -seizures -stomach, ulcer or intestine disease including colitis and diverticulitis -thyroid problem -an unusual or allergic reaction to dexamethasone, corticosteroids, other medicines, lactose, foods, dyes, or preservatives -pregnant or trying to get pregnant -breast-feeding How should I use this medicine? This medicine is for injection into a muscle, joint, lesion, soft tissue, or vein. It is given by a health care professional in a hospital or clinic setting. Talk to your pediatrician regarding the use of this medicine in children. Special care may be needed. Overdosage: If you think you have taken too much of this medicine contact a poison control  center or emergency room at once. NOTE: This medicine is only for you. Do not share this medicine with others. What if I miss a dose? This may not apply. If you are having a series of injections over a prolonged period, try not to miss an appointment. Call your doctor or health care professional to reschedule if you are unable to keep an appointment. What may interact with this medicine? Do not take this medicine with any of the following medications: -mifepristone, RU-486 -vaccines This medicine may also interact with the following medications: -amphotericin B -antibiotics like clarithromycin, erythromycin, and troleandomycin -aspirin and aspirin-like drugs -barbiturates like phenobarbital -carbamazepine -cholestyramine -cholinesterase inhibitors like donepezil, galantamine, rivastigmine, and tacrine -cyclosporine -digoxin -diuretics -ephedrine -male hormones, like estrogens or progestins and birth control pills -indinavir -isoniazid -ketoconazole -medicines for diabetes -medicines that improve muscle tone or strength for conditions like myasthenia gravis -NSAIDs, medicines for pain and inflammation, like ibuprofen or naproxen -phenytoin -rifampin -thalidomide -warfarin This list may not describe all possible interactions. Give your health care provider a list of all the medicines, herbs, non-prescription drugs, or dietary supplements you use. Also tell them if you smoke, drink alcohol, or use illegal drugs. Some items may interact with your medicine. What should I watch for while using this medicine? Your condition will be monitored carefully while you are receiving this medicine. If you are taking this medicine for a long time, carry an identification card with your name and address, the type and dose of your medicine, and your doctor's name and address. This medicine may increase your risk of getting an infection. Stay away from people who are sick. Tell your doctor or health  care professional if you are around anyone with measles or chickenpox. Talk to your health care provider before you get any vaccines that you take this medicine. If you are going to have surgery, tell your doctor or health care professional that you have taken this medicine within the last twelve months. Ask your doctor or health care professional about your diet. You may need to lower the amount of salt you eat. The medicine can increase your blood sugar. If you are a diabetic check with your doctor if you need help adjusting the dose of your diabetic medicine.  What side effects may I notice from receiving this medicine? Side effects that you should report to your doctor or health care professional as soon as possible: -allergic reactions like skin rash, itching or hives, swelling of the face, lips, or tongue -black or tarry stools -change in the amount of urine -changes in vision -confusion, excitement, restlessness, a false sense of well-being -fever, sore throat, sneezing, cough, or other signs of infection, wounds that will not heal -hallucinations -increased thirst -mental depression, mood swings, mistaken feelings of self importance or of being mistreated -pain in hips, back, ribs, arms, shoulders, or legs -pain, redness, or irritation at the injection site -redness, blistering, peeling or loosening of the skin, including inside the mouth -rounding out of face -swelling of feet or lower legs -unusual bleeding or bruising -unusual tired or weak -wounds that do not heal Side effects that usually do not require medical attention (report to your doctor or health care professional if they continue or are bothersome): -diarrhea or constipation -change in taste -headache -nausea, vomiting -skin problems, acne, thin and shiny skin -touble sleeping -unusual growth of hair on the face or body -weight gain This list may not describe all possible side effects. Call your doctor for medical  advice about side effects. You may report side effects to FDA at 1-800-FDA-1088. Where should I keep my medicine? This drug is given in a hospital or clinic and will not be stored at home. NOTE: This sheet is a summary. It may not cover all possible information. If you have questions about this medicine, talk to your doctor, pharmacist, or health care provider.  2015, [employer]. (2008-02-26 14:04:12) Ondansetron tablets What is this medicine? ONDANSETRON (on DAN se tron) is used to treat nausea and vomiting caused by chemotherapy. It is also used to prevent or treat nausea and vomiting after surgery. This medicine may be used for other purposes; ask your health care provider or pharmacist if you have questions. COMMON BRAND NAME(S): Zofran What should I tell my health care provider before I take this medicine? They need to know if you have any of these conditions: -heart disease -history of irregular heartbeat -liver disease -low levels of magnesium or potassium in the blood -an unusual or allergic reaction to ondansetron, granisetron, other medicines, foods, dyes, or preservatives -pregnant or trying to get pregnant -breast-feeding How should I use this medicine? Take this medicine by mouth with a glass of water. Follow the directions on your prescription label. Take your doses at regular intervals. Do not take your medicine more often than directed. Talk to your pediatrician regarding the use of this medicine in children. Special care may be needed. Overdosage: If you think you have taken too much of this medicine contact a poison control center or emergency room at once. NOTE: This medicine is only for you. Do not share this medicine with others. What if I miss a dose? If you miss a dose, take it as soon as you can. If it is almost time for your next dose, take only that dose. Do not take double or extra doses. What may interact with this medicine? Do not take this  medicine with any of the following medications: -apomorphine -certain medicines for fungal infections like fluconazole, itraconazole, ketoconazole, posaconazole, voriconazole -cisapride -dofetilide -dronedarone -pimozide -thioridazine -ziprasidone This medicine may also interact with the following medications: -carbamazepine -certain medicines for depression, anxiety, or psychotic disturbances -fentanyl -linezolid -MAOIs like Carbex, Eldepryl, Marplan, Nardil, and Parnate -methylene blue (injected into a  vein) -other medicines that prolong the QT interval (cause an abnormal heart rhythm) -phenytoin -rifampicin -tramadol This list may not describe all possible interactions. Give your health care provider a list of all the medicines, herbs, non-prescription drugs, or dietary supplements you use. Also tell them if you smoke, drink alcohol, or use illegal drugs. Some items may interact with your medicine. What should I watch for while using this medicine? Check with your doctor or health care professional right away if you have any sign of an allergic reaction. What side effects may I notice from receiving this medicine? Side effects that you should report to your doctor or health care professional as soon as possible: -allergic reactions like skin rash, itching or hives, swelling of the face, lips or tongue -breathing problems -confusion -dizziness -fast or irregular heartbeat -feeling faint or lightheaded, falls -fever and chills -loss of balance or coordination -seizures -sweating -swelling of the hands or feet -tightness in the chest -tremors -unusually weak or tired Side effects that usually do not require medical attention (report to your doctor or health care professional if they continue or are bothersome): -constipation or diarrhea -headache This list may not describe all possible side effects. Call your doctor for medical advice about side effects. You may report side  effects to FDA at 1-800-FDA-1088. Where should I keep my medicine? Keep out of the reach of children. Store between 2 and 30 degrees C (36 and 86 degrees F). Throw away any unused medicine after the expiration date. NOTE: This sheet is a summary. It may not cover all possible information. If you have questions about this medicine, talk to your doctor, pharmacist, or health care provider.  2015, [employer]. (2013-08-12 16:27:45) Prochlorperazine tablets What is this medicine? PROCHLORPERAZINE (proe klor PER a zeen) helps to control severe nausea and vomiting. This medicine is also used to treat schizophrenia. It can also help patients who experience anxiety that is not due to psychological illness. This medicine may be used for other purposes; ask your health care provider or pharmacist if you have questions. COMMON BRAND NAME(S): Compazine What should I tell my health care provider before I take this medicine? They need to know if you have any of these conditions: -blood disorders or disease -dementia -liver disease or jaundice -Parkinson's disease -uncontrollable movement disorder -an unusual or allergic reaction to prochlorperazine, other medicines, foods, dyes, or preservatives -pregnant or trying to get pregnant -breast-feeding How should I use this medicine? Take this medicine by mouth with a glass of water. Follow the directions on the prescription label. Take your doses at regular intervals. Do not take your medicine more often than directed. Do not stop taking this medicine suddenly. This can cause nausea, vomiting, and dizziness. Ask your doctor or health care professional for advice. Talk to your pediatrician regarding the use of this medicine in children. Special care may be needed. While this drug may be prescribed for children as young as 2 years for selected conditions, precautions do apply. Overdosage: If you think you have taken too much of this medicine contact a  poison control center or emergency room at once. NOTE: This medicine is only for you. Do not share this medicine with others. What if I miss a dose? If you miss a dose, take it as soon as you can. If it is almost time for your next dose, take only that dose. Do not take double or extra doses. What may interact with this medicine? Do not take this medicine  with any of the following medications: -amoxapine -antidepressants like citalopram, escitalopram, fluoxetine, paroxetine, and sertraline -deferoxamine -dofetilide -maprotiline -tricyclic antidepressants like amitriptyline, clomipramine, imipramine, nortiptyline and others This medicine may also interact with the following medications: -lithium -medicines for pain -phenytoin -propranolol -warfarin This list may not describe all possible interactions. Give your health care provider a list of all the medicines, herbs, non-prescription drugs, or dietary supplements you use. Also tell them if you smoke, drink alcohol, or use illegal drugs. Some items may interact with your medicine. What should I watch for while using this medicine? Visit your doctor or health care professional for regular checks on your progress. You may get drowsy or dizzy. Do not drive, use machinery, or do anything that needs mental alertness until you know how this medicine affects you. Do not stand or sit up quickly, especially if you are an older patient. This reduces the risk of dizzy or fainting spells. Alcohol may interfere with the effect of this medicine. Avoid alcoholic drinks. This medicine can reduce the response of your body to heat or cold. Dress warm in cold weather and stay hydrated in hot weather. If possible, avoid extreme temperatures like saunas, hot tubs, very hot or cold showers, or activities that can cause dehydration such as vigorous exercise. This medicine can make you more sensitive to the sun. Keep out of the sun. If you cannot avoid being in the  sun, wear protective clothing and use sunscreen. Do not use sun lamps or tanning beds/booths. Your mouth may get dry. Chewing sugarless gum or sucking hard candy, and drinking plenty of water may help. Contact your doctor if the problem does not go away or is severe. What side effects may I notice from receiving this medicine? Side effects that you should report to your doctor or health care professional as soon as possible: -blurred vision -breast enlargement in men or women -breast milk in women who are not breast-feeding -chest pain, fast or irregular heartbeat -confusion, restlessness -dark yellow or brown urine -difficulty breathing or swallowing -dizziness or fainting spells -drooling, shaking, movement difficulty (shuffling walk) or rigidity -fever, chills, sore throat -involuntary or uncontrollable movements of the eyes, mouth, head, arms, and legs -seizures -stomach area pain -unusually weak or tired -unusual bleeding or bruising -yellowing of skin or eyes Side effects that usually do not require medical attention (report to your doctor or health care professional if they continue or are bothersome): -difficulty passing urine -difficulty sleeping -headache -sexual dysfunction -skin rash, or itching This list may not describe all possible side effects. Call your doctor for medical advice about side effects. You may report side effects to FDA at 1-800-FDA-1088. Where should I keep my medicine? Keep out of the reach of children. Store at room temperature between 15 and 30 degrees C (59 and 86 degrees F). Protect from light. Throw away any unused medicine after the expiration date. NOTE: This sheet is a summary. It may not cover all possible information. If you have questions about this medicine, talk to your doctor, pharmacist, or health care provider.  2015, [employer]. (2012-03-25 16:59:39) Lidocaine; Prilocaine cream What is this medicine? LIDOCAINE; PRILOCAINE  (LYE doe kane; PRIL oh kane) is a topical anesthetic that causes loss of feeling in the skin and surrounding tissues. It is used to numb the skin before procedures or injections. This medicine may be used for other purposes; ask your health care provider or pharmacist if you have questions. COMMON BRAND NAME(S): EMLA What should I  tell my health care provider before I take this medicine? They need to know if you have any of these conditions: -glucose-6-phosphate deficiencies -heart disease -kidney or liver disease -methemoglobinemia -an unusual or allergic reaction to lidocaine, prilocaine, other medicines, foods, dyes, or preservatives -pregnant or trying to get pregnant -breast-feeding How should I use this medicine? This medicine is for external use only on the skin. Do not take by mouth. Follow the directions on the prescription label. Wash hands before and after use. Do not use more or leave in contact with the skin longer than directed. Do not apply to eyes or open wounds. It can cause irritation and blurred or temporary loss of vision. If this medicine comes in contact with your eyes, immediately rinse the eye with water. Do not touch or rub the eye. Contact your health care provider right away. Talk to your pediatrician regarding the use of this medicine in children. While this medicine may be prescribed for children for selected conditions, precautions do apply. Overdosage: If you think you have taken too much of this medicine contact a poison control center or emergency room at once. NOTE: This medicine is only for you. Do not share this medicine with others. What if I miss a dose? This medicine is usually only applied once prior to each procedure. It must be in contact with the skin for a period of time for it to work. If you applied this medicine later than directed, tell your health care professional before starting the procedure. What may interact with this  medicine? -acetaminophen -chloroquine -dapsone -medicines to control heart rhythm -nitrates like nitroglycerin and nitroprusside -other ointments, creams, or sprays that may contain anesthetic medicine -phenobarbital -phenytoin -quinine -sulfonamides like sulfacetamide, sulfamethoxazole, sulfasalazine and others This list may not describe all possible interactions. Give your health care provider a list of all the medicines, herbs, non-prescription drugs, or dietary supplements you use. Also tell them if you smoke, drink alcohol, or use illegal drugs. Some items may interact with your medicine. What should I watch for while using this medicine? Be careful to avoid injury to the treated area while it is numb and you are not aware of pain. Avoid scratching, rubbing, or exposing the treated area to hot or cold temperatures until complete sensation has returned. The numb feeling will wear off a few hours after applying the cream. What side effects may I notice from receiving this medicine? Side effects that you should report to your doctor or health care professional as soon as possible: -blurred vision -chest pain -difficulty breathing -dizziness -drowsiness -fast or irregular heartbeat -skin rash or itching -swelling of your throat, lips, or face -trembling Side effects that usually do not require medical attention (report to your doctor or health care professional if they continue or are bothersome): -changes in ability to feel hot or cold -redness and swelling at the application site This list may not describe all possible side effects. Call your doctor for medical advice about side effects. You may report side effects to FDA at 1-800-FDA-1088. Where should I keep my medicine? Keep out of reach of children. Store at room temperature between 15 and 30 degrees C (59 and 86 degrees F). Keep container tightly closed. Throw away any unused medicine after the expiration date. NOTE: This  sheet is a summary. It may not cover all possible information. If you have questions about this medicine, talk to your doctor, pharmacist, or health care provider.  2015, Elsevier/Gold Standard. (2008-05-10 17:14:35)

## 2015-07-29 ENCOUNTER — Other Ambulatory Visit: Payer: Self-pay | Admitting: General Surgery

## 2015-07-31 ENCOUNTER — Encounter (HOSPITAL_COMMUNITY): Payer: Self-pay | Admitting: Hematology & Oncology

## 2015-08-01 ENCOUNTER — Encounter (HOSPITAL_BASED_OUTPATIENT_CLINIC_OR_DEPARTMENT_OTHER): Payer: Commercial Managed Care - HMO

## 2015-08-01 DIAGNOSIS — C20 Malignant neoplasm of rectum: Secondary | ICD-10-CM

## 2015-08-01 NOTE — Progress Notes (Signed)
Chemo teaching done and consent signed for Oxaliplatin, Leucovorin, 5FU. Calendar to be made for patient when we know the date of the port placement. Distress screening done. [employer] mail delivery called to be sure that they received our rx. They said that they did but that pt needed to make a payment on his profile prior to them shipping to him. I let pt know this. He said ok. Post op appt only with patient on 9/20 with Dr. [name]. Port-a-cath placement in the works but not scheduled yet per [employer] @ [place].

## 2015-08-02 ENCOUNTER — Encounter: Payer: Self-pay | Admitting: *Deleted

## 2015-08-02 DIAGNOSIS — E669 Obesity, unspecified: Secondary | ICD-10-CM | POA: Diagnosis not present

## 2015-08-02 DIAGNOSIS — I10 Essential (primary) hypertension: Secondary | ICD-10-CM | POA: Diagnosis not present

## 2015-08-02 DIAGNOSIS — C20 Malignant neoplasm of rectum: Secondary | ICD-10-CM | POA: Diagnosis not present

## 2015-08-02 NOTE — Progress Notes (Signed)
[facility] Psychosocial Distress Screening Clinical Social Work  Clinical Social Work was referred by distress screening protocol.  The patient scored a 5 on the Psychosocial Distress Thermometer which indicates mild distress. Clinical Social Worker phoned pt at home to assess for distress and other psychosocial needs. CSW attempted to leave pt message, but phone stated "person not accepting calls". CSW had no way to leave a message. CSW reviewed chart and it appears MD assisted with issues re. Pain and other medical needs at appointment. CSW will attempt to follow up on future appointments.   ONCBCN DISTRESS SCREENING 08/01/2015  Screening Type Initial Screening  Distress experienced in past week (1-10) 5  Physical Problem type Pain;Breathing;Tingling hands/feet    Clinical Social Worker follow up needed: Yes.    If yes, follow up plan: See above Loren Racer, [place] Worker [place]  [facility] Phone: [phone] Fax: [phone]

## 2015-08-03 ENCOUNTER — Telehealth: Payer: Self-pay | Admitting: *Deleted

## 2015-08-03 NOTE — Telephone Encounter (Signed)
Spoke with patient.  He wants to be seen in [place].  I will notify Dr. [name] and Dr. Lisbeth Renshaw

## 2015-08-05 ENCOUNTER — Other Ambulatory Visit (HOSPITAL_COMMUNITY): Payer: Self-pay | Admitting: Hematology & Oncology

## 2015-08-05 MED ORDER — ONDANSETRON HCL 8 MG PO TABS: 8.0000 mg | ORAL_TABLET | Freq: Two times a day (BID) | ORAL | Status: DC

## 2015-08-05 MED ORDER — PROCHLORPERAZINE MALEATE 10 MG PO TABS: 10.0000 mg | ORAL_TABLET | Freq: Four times a day (QID) | ORAL | Status: DC | PRN

## 2015-08-05 MED ORDER — LIDOCAINE-PRILOCAINE 2.5-2.5 % EX CREA: TOPICAL_CREAM | CUTANEOUS | Status: DC

## 2015-08-05 MED ORDER — DEXAMETHASONE 4 MG PO TABS: 8.0000 mg | ORAL_TABLET | Freq: Two times a day (BID) | ORAL | Status: DC

## 2015-08-05 MED ORDER — LORAZEPAM 0.5 MG PO TABS: 0.5000 mg | ORAL_TABLET | Freq: Four times a day (QID) | ORAL | Status: DC | PRN

## 2015-08-08 ENCOUNTER — Encounter (HOSPITAL_COMMUNITY): Payer: Self-pay | Admitting: *Deleted

## 2015-08-08 MED ORDER — DEXTROSE 5 % IV SOLN
3.0000 g | INTRAVENOUS | Status: AC
  Administered 2015-08-09: 3 g via INTRAVENOUS
  Filled 2015-08-08 (×2): qty 3000

## 2015-08-09 ENCOUNTER — Ambulatory Visit (HOSPITAL_COMMUNITY): Payer: Commercial Managed Care - HMO

## 2015-08-09 ENCOUNTER — Ambulatory Visit (HOSPITAL_COMMUNITY): Payer: Commercial Managed Care - HMO | Admitting: Anesthesiology

## 2015-08-09 ENCOUNTER — Ambulatory Visit (HOSPITAL_COMMUNITY)
Admission: RE | Admit: 2015-08-09 | Discharge: 2015-08-09 | Disposition: A | Discharge status: Home / Self Care | Payer: Commercial Managed Care - HMO | Source: Ambulatory Visit | Attending: General Surgery | Admitting: General Surgery

## 2015-08-09 ENCOUNTER — Encounter (HOSPITAL_COMMUNITY): Payer: Self-pay | Admitting: *Deleted

## 2015-08-09 ENCOUNTER — Encounter (HOSPITAL_COMMUNITY)
Admission: RE | Disposition: A | Discharge status: Home / Self Care | Payer: Self-pay | Source: Ambulatory Visit | Attending: General Surgery

## 2015-08-09 ENCOUNTER — Ambulatory Visit (HOSPITAL_COMMUNITY): Payer: Commercial Managed Care - HMO | Admitting: Oncology

## 2015-08-09 DIAGNOSIS — Z452 Encounter for adjustment and management of vascular access device: Secondary | ICD-10-CM | POA: Diagnosis not present

## 2015-08-09 DIAGNOSIS — E669 Obesity, unspecified: Secondary | ICD-10-CM | POA: Insufficient documentation

## 2015-08-09 DIAGNOSIS — C20 Malignant neoplasm of rectum: Secondary | ICD-10-CM | POA: Insufficient documentation

## 2015-08-09 DIAGNOSIS — C19 Malignant neoplasm of rectosigmoid junction: Secondary | ICD-10-CM | POA: Diagnosis not present

## 2015-08-09 DIAGNOSIS — Z95828 Presence of other vascular implants and grafts: Secondary | ICD-10-CM

## 2015-08-09 DIAGNOSIS — M1990 Unspecified osteoarthritis, unspecified site: Secondary | ICD-10-CM | POA: Diagnosis not present

## 2015-08-09 DIAGNOSIS — C189 Malignant neoplasm of colon, unspecified: Secondary | ICD-10-CM

## 2015-08-09 DIAGNOSIS — G4730 Sleep apnea, unspecified: Secondary | ICD-10-CM | POA: Diagnosis not present

## 2015-08-09 DIAGNOSIS — E119 Type 2 diabetes mellitus without complications: Secondary | ICD-10-CM | POA: Diagnosis not present

## 2015-08-09 DIAGNOSIS — Z79899 Other long term (current) drug therapy: Secondary | ICD-10-CM | POA: Insufficient documentation

## 2015-08-09 DIAGNOSIS — I10 Essential (primary) hypertension: Secondary | ICD-10-CM | POA: Insufficient documentation

## 2015-08-09 DIAGNOSIS — K219 Gastro-esophageal reflux disease without esophagitis: Secondary | ICD-10-CM | POA: Diagnosis not present

## 2015-08-09 DIAGNOSIS — Z6836 Body mass index (BMI) 36.0-36.9, adult: Secondary | ICD-10-CM | POA: Insufficient documentation

## 2015-08-09 HISTORY — DX: Gastro-esophageal reflux disease without esophagitis: K21.9

## 2015-08-09 HISTORY — PX: PORTACATH PLACEMENT: SHX2246

## 2015-08-09 HISTORY — DX: Polyneuropathy, unspecified: G62.9

## 2015-08-09 LAB — BASIC METABOLIC PANEL
Anion gap: 7 (ref 5–15)
BUN: 10 mg/dL (ref 6–20)
CALCIUM: 9.3 mg/dL (ref 8.9–10.3)
CO2: 24 mmol/L (ref 22–32)
Chloride: 106 mmol/L (ref 101–111)
Creatinine, Ser: 1.01 mg/dL (ref 0.61–1.24)
GFR calc Af Amer: >60 mL/min (ref >60)
GLUCOSE: 105 mg/dL — AB (ref 65–99)
Potassium: 3.7 mmol/L (ref 3.5–5.1)
Sodium: 137 mmol/L (ref 135–145)

## 2015-08-09 LAB — CBC
HCT: 36.1 % — ABNORMAL LOW (ref 39.0–52.0)
Hemoglobin: 11.9 g/dL — ABNORMAL LOW (ref 13.0–17.0)
MCH: 28.8 pg (ref 26.0–34.0)
MCHC: 33 g/dL (ref 30.0–36.0)
MCV: 87.4 fL (ref 78.0–100.0)
Platelets: 161 10*3/uL (ref 150–400)
RBC: 4.13 MIL/uL — ABNORMAL LOW (ref 4.22–5.81)
RDW: 12.9 % (ref 11.5–15.5)
WBC: 7.1 10*3/uL (ref 4.0–10.5)

## 2015-08-09 LAB — GLUCOSE, CAPILLARY
GLUCOSE-CAPILLARY: 115 mg/dL — AB (ref 65–99)
Glucose-Capillary: 91 mg/dL (ref 65–99)

## 2015-08-09 SURGERY — INSERTION, TUNNELED CENTRAL VENOUS DEVICE, WITH PORT
Anesthesia: General | Site: Chest

## 2015-08-09 MED ORDER — HYDROMORPHONE HCL 1 MG/ML IJ SOLN: 0.2500 mg | INTRAMUSCULAR | Status: DC | PRN

## 2015-08-09 MED ORDER — ONDANSETRON HCL 4 MG/2ML IJ SOLN
INTRAMUSCULAR | Status: DC | PRN
  Administered 2015-08-09: 4 mg via INTRAVENOUS

## 2015-08-09 MED ORDER — LACTATED RINGERS IV SOLN
INTRAVENOUS | Status: DC | PRN
  Administered 2015-08-09: 07:00:00 via INTRAVENOUS

## 2015-08-09 MED ORDER — HYDROMORPHONE HCL 1 MG/ML IJ SOLN
INTRAMUSCULAR | Status: AC
  Filled 2015-08-09: qty 1

## 2015-08-09 MED ORDER — HYDROMORPHONE HCL 1 MG/ML IJ SOLN
0.5000 mg | INTRAMUSCULAR | Status: DC | PRN
  Administered 2015-08-09: 0.5 mg via INTRAVENOUS

## 2015-08-09 MED ORDER — PROPOFOL 10 MG/ML IV BOLUS
INTRAVENOUS | Status: DC | PRN
  Administered 2015-08-09: 200 mg via INTRAVENOUS

## 2015-08-09 MED ORDER — BUPIVACAINE-EPINEPHRINE (PF) 0.5% -1:200000 IJ SOLN
INTRAMUSCULAR | Status: AC
  Filled 2015-08-09: qty 30

## 2015-08-09 MED ORDER — SODIUM CHLORIDE 0.9 % IV SOLN
INTRAVENOUS | Status: DC | PRN
  Administered 2015-08-09: 500 mL

## 2015-08-09 MED ORDER — MIDAZOLAM HCL 5 MG/5ML IJ SOLN
INTRAMUSCULAR | Status: DC | PRN
  Administered 2015-08-09: 2 mg via INTRAVENOUS

## 2015-08-09 MED ORDER — LIDOCAINE HCL (CARDIAC) 20 MG/ML IV SOLN
INTRAVENOUS | Status: DC | PRN
  Administered 2015-08-09: 100 mg via INTRAVENOUS

## 2015-08-09 MED ORDER — HEPARIN SOD (PORK) LOCK FLUSH 100 UNIT/ML IV SOLN
INTRAVENOUS | Status: DC | PRN
  Administered 2015-08-09: 500 [IU] via INTRAVENOUS

## 2015-08-09 MED ORDER — FENTANYL CITRATE (PF) 250 MCG/5ML IJ SOLN
INTRAMUSCULAR | Status: DC | PRN
  Administered 2015-08-09: 100 ug via INTRAVENOUS

## 2015-08-09 MED ORDER — OXYCODONE HCL 5 MG PO TABS: 5.0000 mg | ORAL_TABLET | ORAL | Status: DC | PRN

## 2015-08-09 MED ORDER — MIDAZOLAM HCL 2 MG/2ML IJ SOLN
INTRAMUSCULAR | Status: AC
  Filled 2015-08-09: qty 4

## 2015-08-09 MED ORDER — FENTANYL CITRATE (PF) 250 MCG/5ML IJ SOLN
INTRAMUSCULAR | Status: AC
  Filled 2015-08-09: qty 5

## 2015-08-09 MED ORDER — HEPARIN SOD (PORK) LOCK FLUSH 100 UNIT/ML IV SOLN
INTRAVENOUS | Status: AC
  Filled 2015-08-09: qty 5

## 2015-08-09 MED ORDER — PHENYLEPHRINE HCL 10 MG/ML IJ SOLN
INTRAMUSCULAR | Status: DC | PRN
  Administered 2015-08-09 (×2): 120 ug via INTRAVENOUS

## 2015-08-09 MED ORDER — BUPIVACAINE-EPINEPHRINE 0.5% -1:200000 IJ SOLN
INTRAMUSCULAR | Status: DC | PRN
  Administered 2015-08-09: 9 mL

## 2015-08-09 MED ORDER — 0.9 % SODIUM CHLORIDE (POUR BTL) OPTIME
TOPICAL | Status: DC | PRN
  Administered 2015-08-09: 1000 mL

## 2015-08-09 MED ORDER — PROMETHAZINE HCL 25 MG/ML IJ SOLN: 6.2500 mg | INTRAMUSCULAR | Status: DC | PRN

## 2015-08-09 SURGICAL SUPPLY — 52 items
BAG DECANTER FOR FLEXI CONT (MISCELLANEOUS) ×3 IMPLANT
BLADE SURG 11 STRL SS (BLADE) ×2 IMPLANT
BLADE SURG 15 STRL LF DISP TIS (BLADE) IMPLANT
BLADE SURG 15 STRL SS (BLADE) ×3
CANISTER SUCTION 2500CC (MISCELLANEOUS) IMPLANT
CHLORAPREP W/TINT 10.5 ML (MISCELLANEOUS) ×3 IMPLANT
COVER MAYO STAND STRL (DRAPES) ×3 IMPLANT
COVER SURGICAL LIGHT HANDLE (MISCELLANEOUS) ×3 IMPLANT
COVER TRANSDUCER ULTRASND GEL (DRAPE) IMPLANT
CRADLE DONUT ADULT HEAD (MISCELLANEOUS) ×3 IMPLANT
DECANTER SPIKE VIAL GLASS SM (MISCELLANEOUS) IMPLANT
DRAPE C-ARM 42X72 X-RAY (DRAPES) ×3 IMPLANT
DRAPE LAPAROSCOPIC ABDOMINAL (DRAPES) ×3 IMPLANT
DRAPE UTILITY XL STRL (DRAPES) ×6 IMPLANT
DRAPE WARM FLUID 44X44 (DRAPE) IMPLANT
ELECT CAUTERY BLADE 6.4 (BLADE) ×3 IMPLANT
ELECT REM PT RETURN 9FT ADLT (ELECTROSURGICAL) ×3
ELECTRODE REM PT RTRN 9FT ADLT (ELECTROSURGICAL) ×1 IMPLANT
GAUZE SPONGE 4X4 16PLY XRAY LF (GAUZE/BANDAGES/DRESSINGS) ×3 IMPLANT
GEL ULTRASOUND 20GR AQUASONIC (MISCELLANEOUS) IMPLANT
GLOVE BIO SURGEON STRL SZ 6.5 (GLOVE) ×3 IMPLANT
GLOVE BIO SURGEON STRL SZ7 (GLOVE) ×2 IMPLANT
GLOVE BIO SURGEONS STRL SZ 6.5 (GLOVE) ×2
GLOVE BIOGEL PI IND STRL 7.0 (GLOVE) ×1 IMPLANT
GLOVE BIOGEL PI INDICATOR 7.0 (GLOVE) ×4
GOWN STRL REUS W/ TWL LRG LVL3 (GOWN DISPOSABLE) ×1 IMPLANT
GOWN STRL REUS W/TWL 2XL LVL3 (GOWN DISPOSABLE) ×3 IMPLANT
GOWN STRL REUS W/TWL LRG LVL3 (GOWN DISPOSABLE) ×3
KIT BASIN OR (CUSTOM PROCEDURE TRAY) ×3 IMPLANT
KIT PORT POWER 8FR ISP CVUE (Catheter) ×2 IMPLANT
KIT ROOM TURNOVER OR (KITS) ×3 IMPLANT
LIQUID BAND (GAUZE/BANDAGES/DRESSINGS) ×3 IMPLANT
NDL HYPO 25GX1X1/2 BEV (NEEDLE) ×1 IMPLANT
NEEDLE HYPO 25GX1X1/2 BEV (NEEDLE) ×3 IMPLANT
NS IRRIG 1000ML POUR BTL (IV SOLUTION) ×3 IMPLANT
PACK SURGICAL SETUP 50X90 (CUSTOM PROCEDURE TRAY) ×3 IMPLANT
PAD ARMBOARD 7.5X6 YLW CONV (MISCELLANEOUS) ×6 IMPLANT
PENCIL BUTTON HOLSTER BLD 10FT (ELECTRODE) ×3 IMPLANT
SUT MON AB 4-0 PC3 18 (SUTURE) ×3 IMPLANT
SUT PROLENE 2 0 CT2 30 (SUTURE) ×3 IMPLANT
SUT PROLENE 2 0 SH DA (SUTURE) ×3 IMPLANT
SUT VIC AB 2-0 SH 27 (SUTURE) ×3
SUT VIC AB 2-0 SH 27XBRD (SUTURE) IMPLANT
SUT VIC AB 3-0 SH 18 (SUTURE) ×3 IMPLANT
SUT VIC AB 3-0 SH 27 (SUTURE)
SUT VIC AB 3-0 SH 27X BRD (SUTURE) IMPLANT
SUT VICRYL 4-0 PS2 18IN ABS (SUTURE) ×3 IMPLANT
SYR 20ML ECCENTRIC (SYRINGE) ×6 IMPLANT
SYR 5ML LUER SLIP (SYRINGE) ×3 IMPLANT
SYR CONTROL 10ML LL (SYRINGE) ×3 IMPLANT
TOWEL OR 17X24 6PK STRL BLUE (TOWEL DISPOSABLE) ×3 IMPLANT
TOWEL OR 17X26 10 PK STRL BLUE (TOWEL DISPOSABLE) ×3 IMPLANT

## 2015-08-09 NOTE — Anesthesia Preprocedure Evaluation (Signed)
Anesthesia Evaluation  Patient identified by MRN, date of birth, ID band Patient awake    Reviewed: Allergy & Precautions, Patient's Chart, lab work & pertinent test results  Airway Mallampati: II       Dental   Pulmonary sleep apnea , former smoker,    breath sounds clear to auscultation       Cardiovascular hypertension,  Rhythm:Regular Rate:Normal     Neuro/Psych    GI/Hepatic GERD  ,  Endo/Other  diabetes  Renal/GU      Musculoskeletal  (+) Arthritis ,   Abdominal (+) + obese,   Peds  Hematology   Anesthesia Other Findings   Reproductive/Obstetrics                             Anesthesia Physical Anesthesia Plan  ASA: III  Anesthesia Plan: General   Post-op Pain Management:    Induction: Intravenous  Airway Management Planned: LMA  Additional Equipment:   Intra-op Plan:   Post-operative Plan: Extubation in OR  Informed Consent: I have reviewed the patients History and Physical, chart, labs and discussed the procedure including the risks, benefits and alternatives for the proposed anesthesia with the patient or authorized representative who has indicated his/her understanding and acceptance.     Plan Discussed with:   Anesthesia Plan Comments:         Anesthesia Quick Evaluation

## 2015-08-09 NOTE — Progress Notes (Signed)
Could not find pt in [facility]. Wasted Dilaudid 0.5mg  with Darcella Gasman RN and Dina Rich RN

## 2015-08-09 NOTE — Anesthesia Postprocedure Evaluation (Signed)
  Anesthesia Post-op Note  Patient: [name]  Procedure(s) Performed: Procedure(s): INSERTION PORT-A-CATH LEFT SUBCLAVIAN (N/A)  Patient Location: PACU  Anesthesia Type:General  Level of Consciousness: awake and alert   Airway and Oxygen Therapy: Patient Spontanous Breathing  Post-op Pain: none  Post-op Assessment: Post-op Vital signs reviewed              Post-op Vital Signs: stable  Last Vitals:  Filed Vitals:   08/09/15 0954  BP:   Pulse: 91  Temp: 36.4 C  Resp: 18    Complications: No apparent anesthesia complications

## 2015-08-09 NOTE — Transfer of Care (Signed)
Immediate Anesthesia Transfer of Care Note  Patient: [name]  Procedure(s) Performed: Procedure(s): INSERTION PORT-A-CATH LEFT SUBCLAVIAN (N/A)  Patient Location: PACU  Anesthesia Type:General  Level of Consciousness: awake, alert  and oriented  Airway & Oxygen Therapy: Patient Spontanous Breathing and Patient connected to nasal cannula oxygen  Post-op Assessment: Report given to RN and Post -op Vital signs reviewed and stable  Post vital signs: Reviewed and stable  Last Vitals:  Filed Vitals:   08/09/15 0954  BP:   Pulse: 91  Temp: 36.4 C  Resp: 18    Complications: No apparent anesthesia complications

## 2015-08-09 NOTE — Op Note (Signed)
08/09/2015  8:32 AM  PATIENT:  [name]  54 y.o. male  Patient Care Team: Rosita Fire, MD as PCP - General (Internal Medicine) Daneil Dolin, MD as Consulting Physician (Gastroenterology)  PRE-OPERATIVE DIAGNOSIS:  STAGE THREE RECTAL CANCER  POST-OPERATIVE DIAGNOSIS:  STAGE THREE RECTAL CANCER  PROCEDURE:  INSERTION PORT-A-CATH LEFT SUBCLAVIAN  SURGEON:  Surgeon(s): [name], MD  ANESTHESIA:   local and general  EBL:     DISPOSITION OF SPECIMEN:  N/A  COUNTS:  YES  PLAN OF CARE: Discharge to home after PACU  PATIENT DISPOSITION:  PACU - hemodynamically stable.  INDICATION: Patient with need for IV chemotherapy. Port-A-Cath placement was requested.   Use of a central venous catheter for intravenous therapy was discussed. Technique of catheter placement using ultrasound and fluoroscopy guidance was discussed. Risks such as bleeding, infection, pneumothorax, catheter occlusion, reoperation, and other risks were discussed. I noted a good likelihood this will help address the problem. Questions were answered. The patient expressed understanding & wishes to proceed.   Findings: Abnormal-appearing anatomy. Pt with short SVC and little distance from R atrium and SVC confluence   8 [place] power port. It goes through the left subclavian vein   Procedure: Informed consent was confirmed. Patient was brought the operating room and positioned supine. Arms were tucked. The patient underwent deep sedation. Neck and chest were clipped and prepped and draped in a sterile fashion. A surgical timeout confirmed our plan. I placed a field block of local anesthesia on the chest  I entered into the left subclavian on the first venipuncture using standard landmarks. Non-pulsatile, dark appearing blood was returned. Wire was passed into the superior vena cava and confirmed by fluoroscopy after several attempts.  The wire initially wanted to traverse to the R subclavian vein.  After several  attempts to pass the wire down, it finally dropped into the R atrium. I confirmed placement of the wire in the right side of the chest.  I made an incision in the lateral infraclavicular area and made a subcutaneous pocket. I used a dilator on the wire using Seldinger technique to dilate the tract under fluoroscopy. I placed the catheter into the sheath. I then peeled away the dilator sheath. I tunneled the power port from the puncture site to the chest pocket. I pulled the catheter back under fluoro and just after it looked like it was out of the atrium, it flipped up into the R SCV.  I passed the wire halfway through the catheter and dirrected the catheter back down under fluoro.  I then pulled back to a slightly longer length, keeping the catheter pointing down in the SVC.  I cut the catheter to appropriate length and attached it to the port using the plastic connector. The port was placed into the pocket and secured to the left anterior chest wall using 2-0 Prolene interrupted stitches x2. Catheter flushed well.  Fluoroscopy confirmed the tip in the distal SVC. Catheter aspirated and flushed well. On final fluoro reevaluation the tip seen to be in good position in the distal SVC.  I closed the wounds using 3-0 Vicryl interrupted sutures for the pocket and 4-0 Vicryl stitch was used to close the skin. Dermabond was used on the 2 incisions. CXR will be performed in PACU. Patient should go home later today. Catheter is okay to use.

## 2015-08-09 NOTE — H&P (Signed)
The patient is a 54 year old male who presents with colorectal cancer. This is a 54 year old male who presents to the office with a newly diagnosed rectal cancer. This was noted by Dr. Gala Romney on screening colonoscopy. This was noted to be approximately 5 cm from the anal verge on colonoscopy. CT scan of the abdomen shows no signs of metastatic disease. There is some rectal thickening at the level of the coccyx. CEA is normal. Rectal ultrasound shows a T1/T2 lesion with no lymph node involvement noted. The patient is asymptomatic. He denies any rectal bleeding. He has regular bowel movements.  Pathology reveals stage 3 disease.  He is doing well after surgery.   Other Problems [name], LPN; 03/13/9562 8:75 AM) Arthritis Back Pain Diabetes Mellitus High blood pressure Rectal Cancer Sleep Apnea  Past Surgical History [name], LPN; 6/43/3295 1:88 AM) Gallbladder Surgery - Open Spinal Surgery - Lower Back Spinal Surgery Midback  Diagnostic Studies History [name], LPN; 03/04/6062 0:16 AM) Colonoscopy never  Allergies [name], LPN; 0/08/9322 5:57 AM) No Known Drug Allergies 06/14/2015  Medication History [name], LPN; 02/07/253 2:70 AM) AmLODIPine Besylate (5MG  Tablet, Oral) Active. Gabapentin (800MG  Tablet, Oral) Active. Lisinopril (40MG  Tablet, Oral) Active. Nortriptyline HCl (75MG  Capsule, Oral) Active. TiZANidine HCl (2MG  Tablet, Oral) Active. Medications Reconciled  Social History [name], LPN; 05/12/7627 3:15 AM) Alcohol use Remotely quit alcohol use. Caffeine use Coffee, Tea. Illicit drug use Remotely quit drug use. Tobacco use Never smoker.  Family History [name], LPN; 1/76/1607 3:71 AM) Arthritis Father. Diabetes Mellitus Father. Hypertension Father.     Review of Systems [name] LPN; 0/62/6948 5:46 AM) General Not Present- Appetite Loss, Chills, Fatigue, Fever, Night Sweats, Weight  Gain and Weight Loss. Skin Not Present- Change in Wart/Mole, Dryness, Hives, Jaundice, New Lesions, Non-Healing Wounds, Rash and Ulcer. HEENT Present- Wears glasses/contact lenses. Not Present- Earache, Hearing Loss, Hoarseness, Nose Bleed, Oral Ulcers, Ringing in the Ears, Seasonal Allergies, Sinus Pain, Sore Throat, Visual Disturbances and Yellow Eyes. Respiratory Not Present- Bloody sputum, Chronic Cough, Difficulty Breathing, Snoring and Wheezing. Breast Not Present- Breast Mass, Breast Pain, Nipple Discharge and Skin Changes. Cardiovascular Not Present- Chest Pain, Difficulty Breathing Lying Down, Leg Cramps, Palpitations, Rapid Heart Rate, Shortness of Breath and Swelling of Extremities. Gastrointestinal Not Present- Abdominal Pain, Bloating, Bloody Stool, Change in Bowel Habits, Chronic diarrhea, Constipation, Difficulty Swallowing, Excessive gas, Gets full quickly at meals, Hemorrhoids, Indigestion, Nausea, Rectal Pain and Vomiting. Male Genitourinary Not Present- Blood in Urine, Change in Urinary Stream, Frequency, Impotence, Nocturia, Painful Urination, Urgency and Urine Leakage.  BP 115/72 mmHg  Pulse 89  Temp(Src) 98.4 F (36.9 C) (Oral)  Resp 18  Ht 5\' 3"  (1.6 m)  Wt 94.348 kg (208 lb)  BMI 36.85 kg/m2  SpO2 100%   Physical Exam   General Mental Status-Alert. General Appearance-Consistent with stated age. Hydration-Well hydrated. Voice-Normal.  Head and Neck Head-normocephalic, atraumatic with no lesions or palpable masses. Trachea-midline. Thyroid Gland Characteristics - normal size and consistency.  Eye Eyeball - Bilateral-Extraocular movements intact. Sclera/Conjunctiva - Bilateral-No scleral icterus.  Chest and Lung Exam Chest and lung exam reveals -quiet, even and easy respiratory effort with no use of accessory muscles and on auscultation, normal breath sounds, no adventitious sounds and normal vocal resonance. Inspection Chest Wall -  Normal. Back - normal.  Cardiovascular Cardiovascular examination reveals -normal heart sounds, regular rate and rhythm with no murmurs and normal pedal pulses bilaterally.  Abdomen Inspection Inspection of the abdomen reveals -  No Hernias. Palpation/Percussion Palpation and Percussion of the abdomen reveal - Soft, Non Tender, No Rebound tenderness, No Rigidity (guarding) and No hepatosplenomegaly. Auscultation Auscultation of the abdomen reveals - Bowel sounds normal.  Rectal Anorectal Exam External - normal external exam. Internal - Note: No mass palpated.  Neurologic Neurologic evaluation reveals -alert and oriented x 3 with no impairment of recent or remote memory. Mental Status-Normal.  Musculoskeletal Global Assessment -Note:no gross deformities.  Normal Exam - Left-Upper Extremity Strength Normal and Lower Extremity Strength Normal. Normal Exam - Right-Upper Extremity Strength Normal and Lower Extremity Strength Normal.    Assessment & Plan [name] MD; 04/07/8021 12:42 PM)  PRIMARY CANCER OF RECTUM (154.1  C20) Impression: 54 year old male who was found to have a rectal mass on screening colonoscopy. 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 CT scan of the abdomen showed no signs of metastatic disease and CEA was normal. Rectal ultrasound showed a T1/T2 N0 lesion. The patient underwent a LAR, and this showed a T2 N1 lesion.  Chemotherapy was recommended.  We will place a port.  We have discussed the risks of procedure, which include bleeding, pneumothorax, infection and malfunction of the device.

## 2015-08-09 NOTE — Anesthesia Procedure Notes (Signed)
Procedure Name: LMA Insertion Date/Time: 08/09/2015 7:59 AM Performed by: Mariea Clonts Pre-anesthesia Checklist: Patient identified Patient Re-evaluated:Patient Re-evaluated prior to inductionOxygen Delivery Method: Circle system utilized Preoxygenation: Pre-oxygenation with 100% oxygen Intubation Type: IV induction LMA: LMA inserted LMA Size: 4.0 Number of attempts: 1 Placement Confirmation: breath sounds checked- equal and bilateral and positive ETCO2 Tube secured with: Tape Dental Injury: Teeth and Oropharynx as per pre-operative assessment

## 2015-08-09 NOTE — Discharge Instructions (Addendum)
PORT-A-CATH: POST OP INSTRUCTIONS  Always review your discharge instruction sheet given to you by the facility where your surgery was performed.   1. You make take acetaminophen (Tylenol) or ibuprofen (Advil) as needed.  You may also use ice or heat to help relieve the pain.   2. Take your usually prescribed medications unless otherwise directed. 3. If you need a refill on your pain medication, please contact our office. All narcotic pain medicine now requires a paper prescription.  Phoned in and fax refills are no longer allowed by law.  Prescriptions will not be filled after 5 pm or on weekends.  4. You should follow a light diet for the remainder of the day after your procedure. 5. Most patients will experience some mild swelling and/or bruising in the area of the incision. It may take several days to resolve. 6. It is common to experience some constipation if taking pain medication after surgery. Increasing fluid intake and taking a stool softener (such as Colace) will usually help or prevent this problem from occurring. A mild laxative (Milk of Magnesia or Miralax) should be taken according to package directions if there are no bowel movements after 48 hours.  7. Unless discharge instructions indicate otherwise, you may remove your bandages 48 hours after surgery, and you may shower at that time. You may have steri-strips (small white skin tapes) in place directly over the incision.  These strips should be left on the skin for 7-10 days.  If your surgeon used Dermabond (skin glue) on the incision, you may shower in 24 hours.  The glue will flake off over the next 2-3 weeks.  8. If your port is left accessed at the end of surgery (needle left in port), the dressing cannot get wet and should only by changed by a healthcare professional. When the port is no longer accessed (when the needle has been removed), follow step 7.   9. ACTIVITIES:  Limit activity involving your arms for the next 72 hours. Do  no strenuous exercise or activity for 1 week. You may drive when you are no longer taking prescription pain medication, you can comfortably wear a seatbelt, and you can maneuver your car. 10.You may need to see your doctor in the office for a follow-up appointment.  Please       check with your doctor.  11.When you receive a new Port-a-Cath, you will get a product guide and        ID card.  Please keep them in case you need them.  WHEN TO CALL YOUR DOCTOR [phone]): 1. Fever over 101.0 2. Chills 3. Continued bleeding from incision 4. Increased redness and tenderness at the site 5. Shortness of breath, difficulty breathing   The clinic staff is available to answer your questions during regular business hours. Please dont hesitate to call and ask to speak to one of the nurses or medical assistants for clinical concerns. If you have a medical emergency, go to the nearest emergency room or call 911.  A surgeon from [facility] is always on call at the hospital.     For further information, please visit [email or web address]

## 2015-08-10 ENCOUNTER — Inpatient Hospital Stay (HOSPITAL_COMMUNITY): Payer: Commercial Managed Care - HMO

## 2015-08-10 ENCOUNTER — Ambulatory Visit (HOSPITAL_COMMUNITY): Payer: Commercial Managed Care - HMO

## 2015-08-10 ENCOUNTER — Encounter (HOSPITAL_COMMUNITY): Payer: Self-pay | Admitting: General Surgery

## 2015-08-12 ENCOUNTER — Encounter (HOSPITAL_COMMUNITY): Payer: Commercial Managed Care - HMO

## 2015-08-15 ENCOUNTER — Encounter (HOSPITAL_COMMUNITY): Payer: Self-pay | Admitting: Hematology & Oncology

## 2015-08-15 ENCOUNTER — Encounter (HOSPITAL_COMMUNITY): Payer: Self-pay

## 2015-08-15 ENCOUNTER — Encounter (HOSPITAL_BASED_OUTPATIENT_CLINIC_OR_DEPARTMENT_OTHER): Payer: Commercial Managed Care - HMO

## 2015-08-15 ENCOUNTER — Other Ambulatory Visit (HOSPITAL_COMMUNITY): Payer: Self-pay | Admitting: *Deleted

## 2015-08-15 VITALS — BP 111/79 | HR 99 | Temp 98.0°F | Resp 18 | Wt 204.0 lb

## 2015-08-15 DIAGNOSIS — C20 Malignant neoplasm of rectum: Secondary | ICD-10-CM

## 2015-08-15 DIAGNOSIS — Z5111 Encounter for antineoplastic chemotherapy: Secondary | ICD-10-CM

## 2015-08-15 LAB — CBC WITH DIFFERENTIAL/PLATELET
Basophils Absolute: 0 10*3/uL (ref 0.0–0.1)
Basophils Relative: 1 %
EOS ABS: 0.5 10*3/uL (ref 0.0–0.7)
Eosinophils Relative: 9 %
HEMATOCRIT: 37.4 % — AB (ref 39.0–52.0)
HEMOGLOBIN: 12 g/dL — AB (ref 13.0–17.0)
LYMPHS ABS: 1.7 10*3/uL (ref 0.7–4.0)
LYMPHS PCT: 29 %
MCH: 28.6 pg (ref 26.0–34.0)
MCHC: 32.1 g/dL (ref 30.0–36.0)
MCV: 89 fL (ref 78.0–100.0)
MONOS PCT: 6 %
Monocytes Absolute: 0.4 10*3/uL (ref 0.1–1.0)
NEUTROS PCT: 55 %
Neutro Abs: 3.1 10*3/uL (ref 1.7–7.7)
Platelets: 204 10*3/uL (ref 150–400)
RBC: 4.2 MIL/uL — ABNORMAL LOW (ref 4.22–5.81)
RDW: 13.1 % (ref 11.5–15.5)
WBC: 5.7 10*3/uL (ref 4.0–10.5)

## 2015-08-15 LAB — COMPREHENSIVE METABOLIC PANEL
ALK PHOS: 70 U/L (ref 38–126)
ALT: 25 U/L (ref 17–63)
ANION GAP: 11 (ref 5–15)
AST: 27 U/L (ref 15–41)
Albumin: 4.1 g/dL (ref 3.5–5.0)
BILIRUBIN TOTAL: 0.4 mg/dL (ref 0.3–1.2)
BUN: 12 mg/dL (ref 6–20)
CALCIUM: 9.1 mg/dL (ref 8.9–10.3)
CO2: 24 mmol/L (ref 22–32)
CREATININE: 0.99 mg/dL (ref 0.61–1.24)
Chloride: 104 mmol/L (ref 101–111)
Glucose, Bld: 70 mg/dL (ref 65–99)
Potassium: 3.9 mmol/L (ref 3.5–5.1)
SODIUM: 139 mmol/L (ref 135–145)
TOTAL PROTEIN: 8.1 g/dL (ref 6.5–8.1)

## 2015-08-15 MED ORDER — LEUCOVORIN CALCIUM INJECTION 350 MG
400.0000 mg/m2 | Freq: Once | INTRAVENOUS | Status: AC
  Administered 2015-08-15: 820 mg via INTRAVENOUS
  Filled 2015-08-15: qty 41

## 2015-08-15 MED ORDER — SODIUM CHLORIDE 0.9 % IV SOLN
Freq: Once | INTRAVENOUS | Status: AC
  Administered 2015-08-15: 11:00:00 via INTRAVENOUS
  Filled 2015-08-15: qty 4

## 2015-08-15 MED ORDER — FLUOROURACIL CHEMO INJECTION 2.5 GM/50ML
400.0000 mg/m2 | Freq: Once | INTRAVENOUS | Status: AC
  Administered 2015-08-15: 800 mg via INTRAVENOUS
  Filled 2015-08-15: qty 16

## 2015-08-15 MED ORDER — OXYCODONE HCL 10 MG PO TABS: ORAL_TABLET | ORAL | Status: DC

## 2015-08-15 MED ORDER — OXALIPLATIN CHEMO INJECTION 100 MG/20ML
85.0000 mg/m2 | Freq: Once | INTRAVENOUS | Status: AC
  Administered 2015-08-15: 175 mg via INTRAVENOUS
  Filled 2015-08-15: qty 35

## 2015-08-15 MED ORDER — SODIUM CHLORIDE 0.9 % IV SOLN
2400.0000 mg/m2 | INTRAVENOUS | Status: DC
  Administered 2015-08-15: 4900 mg via INTRAVENOUS
  Filled 2015-08-15: qty 98

## 2015-08-15 MED ORDER — DEXTROSE 5 % IV SOLN
Freq: Once | INTRAVENOUS | Status: AC
  Administered 2015-08-15: 11:00:00 via INTRAVENOUS

## 2015-08-15 NOTE — Patient Instructions (Signed)
[facility] Discharge Instructions for Patients Receiving Chemotherapy  Today you received the following chemotherapy agents:  Oxaliplatin, leucovorin, and 5FU  To help prevent nausea and vomiting after your treatment, we encourage you to take your nausea medication (Zofran and Compazine) as prescribed.  If you develop nausea and vomiting, or diarrhea that is not controlled by your medication, call the clinic.  The clinic phone number is [phone]. Office hours are Monday-Friday 8:30am-5:00pm.  BELOW ARE SYMPTOMS THAT SHOULD BE REPORTED IMMEDIATELY:  *FEVER GREATER THAN 101.0 F  *CHILLS WITH OR WITHOUT FEVER  NAUSEA AND VOMITING THAT IS NOT CONTROLLED WITH YOUR NAUSEA MEDICATION  *UNUSUAL SHORTNESS OF BREATH  *UNUSUAL BRUISING OR BLEEDING  TENDERNESS IN MOUTH AND THROAT WITH OR WITHOUT PRESENCE OF ULCERS  *URINARY PROBLEMS  *BOWEL PROBLEMS  UNUSUAL RASH Items with * indicate a potential emergency and should be followed up as soon as possible. If you have an emergency after office hours please contact your primary care physician or go to the nearest emergency department.  Please call the clinic during office hours if you have any questions or concerns.   You may also contact the Patient Navigator at [phone] should you have any questions or need assistance in obtaining follow up care. _____________________________________________________________________ Have you asked about our STAR program?    STAR stands for Survivorship Training and Rehabilitation, and this is a nationally recognized cancer care program that focuses on survivorship and rehabilitation.  Cancer and cancer treatments may cause problems, such as, pain, making you feel tired and keeping you from doing the things that you need or want to do. Cancer rehabilitation can help. Our goal is to reduce these troubling effects and help you have the best quality of life possible.  You may receive a  survey from a nurse that asks questions about your current state of health.  Based on the survey results, all eligible patients will be referred to the [facility] program for an evaluation so we can better serve you! A frequently asked questions sheet is available upon request.          Oxaliplatin Injection What is this medicine? OXALIPLATIN (ox AL i PLA tin) is a chemotherapy drug. It targets fast dividing cells, like cancer cells, and causes these cells to die. This medicine is used to treat cancers of the colon and rectum, and many other cancers. This medicine may be used for other purposes; ask your health care provider or pharmacist if you have questions. COMMON BRAND NAME(S): Eloxatin What should I tell my health care provider before I take this medicine? They need to know if you have any of these conditions: -kidney disease -an unusual or allergic reaction to oxaliplatin, other chemotherapy, other medicines, foods, dyes, or preservatives -pregnant or trying to get pregnant -breast-feeding How should I use this medicine? This drug is given as an infusion into a vein. It is administered in a hospital or clinic by a specially trained health care professional. Talk to your pediatrician regarding the use of this medicine in children. Special care may be needed. Overdosage: If you think you have taken too much of this medicine contact a poison control center or emergency room at once. NOTE: This medicine is only for you. Do not share this medicine with others. What if I miss a dose? It is important not to miss a dose. Call your doctor or health care professional if you are unable to keep an appointment. What may interact with this  medicine? -medicines to increase blood counts like filgrastim, pegfilgrastim, sargramostim -probenecid -some antibiotics like amikacin, gentamicin, neomycin, polymyxin B, streptomycin, tobramycin -zalcitabine Talk to your doctor or health care professional  before taking any of these medicines: -acetaminophen -aspirin -ibuprofen -ketoprofen -naproxen This list may not describe all possible interactions. Give your health care provider a list of all the medicines, herbs, non-prescription drugs, or dietary supplements you use. Also tell them if you smoke, drink alcohol, or use illegal drugs. Some items may interact with your medicine. What should I watch for while using this medicine? Your condition will be monitored carefully while you are receiving this medicine. You will need important blood work done while you are taking this medicine. This medicine can make you more sensitive to cold. Do not drink cold drinks or use ice. Cover exposed skin before coming in contact with cold temperatures or cold objects. When out in cold weather wear warm clothing and cover your mouth and nose to warm the air that goes into your lungs. Tell your doctor if you get sensitive to the cold. This drug may make you feel generally unwell. This is not uncommon, as chemotherapy can affect healthy cells as well as cancer cells. Report any side effects. Continue your course of treatment even though you feel ill unless your doctor tells you to stop. In some cases, you may be given additional medicines to help with side effects. Follow all directions for their use. Call your doctor or health care professional for advice if you get a fever, chills or sore throat, or other symptoms of a cold or flu. Do not treat yourself. This drug decreases your body's ability to fight infections. Try to avoid being around people who are sick. This medicine may increase your risk to bruise or bleed. Call your doctor or health care professional if you notice any unusual bleeding. Be careful brushing and flossing your teeth or using a toothpick because you may get an infection or bleed more easily. If you have any dental work done, tell your dentist you are receiving this medicine. Avoid taking products  that contain aspirin, acetaminophen, ibuprofen, naproxen, or ketoprofen unless instructed by your doctor. These medicines may hide a fever. Do not become pregnant while taking this medicine. Women should inform their doctor if they wish to become pregnant or think they might be pregnant. There is a potential for serious side effects to an unborn child. Talk to your health care professional or pharmacist for more information. Do not breast-feed an infant while taking this medicine. Call your doctor or health care professional if you get diarrhea. Do not treat yourself. What side effects may I notice from receiving this medicine? Side effects that you should report to your doctor or health care professional as soon as possible: -allergic reactions like skin rash, itching or hives, swelling of the face, lips, or tongue -low blood counts - This drug may decrease the number of white blood cells, red blood cells and platelets. You may be at increased risk for infections and bleeding. -signs of infection - fever or chills, cough, sore throat, pain or difficulty passing urine -signs of decreased platelets or bleeding - bruising, pinpoint red spots on the skin, black, tarry stools, nosebleeds -signs of decreased red blood cells - unusually weak or tired, fainting spells, lightheadedness -breathing problems -chest pain, pressure -cough -diarrhea -jaw tightness -mouth sores -nausea and vomiting -pain, swelling, redness or irritation at the injection site -pain, tingling, numbness in the hands or feet -  problems with balance, talking, walking -redness, blistering, peeling or loosening of the skin, including inside the mouth -trouble passing urine or change in the amount of urine Side effects that usually do not require medical attention (report to your doctor or health care professional if they continue or are bothersome): -changes in vision -constipation -hair loss -loss of appetite -metallic taste in  the mouth or changes in taste -stomach pain This list may not describe all possible side effects. Call your doctor for medical advice about side effects. You may report side effects to FDA at 1-800-FDA-1088. Where should I keep my medicine? This drug is given in a hospital or clinic and will not be stored at home. NOTE: This sheet is a summary. It may not cover all possible information. If you have questions about this medicine, talk to your doctor, pharmacist, or health care provider.  2015, [employer] Standard. (2008-06-01 17:22:47) Leucovorin injection What is this medicine? LEUCOVORIN (loo koe VOR in) is used to prevent or treat the harmful effects of some medicines. This medicine is used to treat anemia caused by a low amount of folic acid in the body. It is also used with 5-fluorouracil (5-FU) to treat colon cancer. This medicine may be used for other purposes; ask your health care provider or pharmacist if you have questions. What should I tell my health care provider before I take this medicine? They need to know if you have any of these conditions: -anemia from low levels of vitamin B-12 in the blood -an unusual or allergic reaction to leucovorin, folic acid, other medicines, foods, dyes, or preservatives -pregnant or trying to get pregnant -breast-feeding How should I use this medicine? This medicine is for injection into a muscle or into a vein. It is given by a health care professional in a hospital or clinic setting. Talk to your pediatrician regarding the use of this medicine in children. Special care may be needed. Overdosage: If you think you have taken too much of this medicine contact a poison control center or emergency room at once. NOTE: This medicine is only for you. Do not share this medicine with others. What if I miss a dose? This does not apply. What may interact with this  medicine? -capecitabine -fluorouracil -phenobarbital -phenytoin -primidone -trimethoprim-sulfamethoxazole This list may not describe all possible interactions. Give your health care provider a list of all the medicines, herbs, non-prescription drugs, or dietary supplements you use. Also tell them if you smoke, drink alcohol, or use illegal drugs. Some items may interact with your medicine. What should I watch for while using this medicine? Your condition will be monitored carefully while you are receiving this medicine. This medicine may increase the side effects of 5-fluorouracil, 5-FU. Tell your doctor or health care professional if you have diarrhea or mouth sores that do not get better or that get worse. What side effects may I notice from receiving this medicine? Side effects that you should report to your doctor or health care professional as soon as possible: -allergic reactions like skin rash, itching or hives, swelling of the face, lips, or tongue -breathing problems -fever, infection -mouth sores -unusual bleeding or bruising -unusually weak or tired Side effects that usually do not require medical attention (report to your doctor or health care professional if they continue or are bothersome): -constipation or diarrhea -loss of appetite -nausea, vomiting This list may not describe all possible side effects. Call your doctor for medical advice about side effects. You may report side effects  to FDA at 1-800-FDA-1088. Where should I keep my medicine? This drug is given in a hospital or clinic and will not be stored at home. NOTE: This sheet is a summary. It may not cover all possible information. If you have questions about this medicine, talk to your doctor, pharmacist, or health care provider.  2015, [employer] Standard. (2008-05-11 16:50:29) Fluorouracil, 5-FU injection What is this medicine? FLUOROURACIL, 5-FU (flure oh YOOR a sil) is a chemotherapy drug. It slows the  growth of cancer cells. This medicine is used to treat many types of cancer like breast cancer, colon or rectal cancer, pancreatic cancer, and stomach cancer. This medicine may be used for other purposes; ask your health care provider or pharmacist if you have questions. COMMON BRAND NAME(S): Adrucil What should I tell my health care provider before I take this medicine? They need to know if you have any of these conditions: -blood disorders -dihydropyrimidine dehydrogenase (DPD) deficiency -infection (especially a virus infection such as chickenpox, cold sores, or herpes) -kidney disease -liver disease -malnourished, poor nutrition -recent or ongoing radiation therapy -an unusual or allergic reaction to fluorouracil, other chemotherapy, other medicines, foods, dyes, or preservatives -pregnant or trying to get pregnant -breast-feeding How should I use this medicine? This drug is given as an infusion or injection into a vein. It is administered in a hospital or clinic by a specially trained health care professional. Talk to your pediatrician regarding the use of this medicine in children. Special care may be needed. Overdosage: If you think you have taken too much of this medicine contact a poison control center or emergency room at once. NOTE: This medicine is only for you. Do not share this medicine with others. What if I miss a dose? It is important not to miss your dose. Call your doctor or health care professional if you are unable to keep an appointment. What may interact with this medicine? -allopurinol -cimetidine -dapsone -digoxin -hydroxyurea -leucovorin -levamisole -medicines for seizures like ethotoin, fosphenytoin, phenytoin -medicines to increase blood counts like filgrastim, pegfilgrastim, sargramostim -medicines that treat or prevent blood clots like warfarin, enoxaparin, and dalteparin -methotrexate -metronidazole -pyrimethamine -some other chemotherapy drugs like  busulfan, cisplatin, estramustine, vinblastine -trimethoprim -trimetrexate -vaccines Talk to your doctor or health care professional before taking any of these medicines: -acetaminophen -aspirin -ibuprofen -ketoprofen -naproxen This list may not describe all possible interactions. Give your health care provider a list of all the medicines, herbs, non-prescription drugs, or dietary supplements you use. Also tell them if you smoke, drink alcohol, or use illegal drugs. Some items may interact with your medicine. What should I watch for while using this medicine? Visit your doctor for checks on your progress. This drug may make you feel generally unwell. This is not uncommon, as chemotherapy can affect healthy cells as well as cancer cells. Report any side effects. Continue your course of treatment even though you feel ill unless your doctor tells you to stop. In some cases, you may be given additional medicines to help with side effects. Follow all directions for their use. Call your doctor or health care professional for advice if you get a fever, chills or sore throat, or other symptoms of a cold or flu. Do not treat yourself. This drug decreases your body's ability to fight infections. Try to avoid being around people who are sick. This medicine may increase your risk to bruise or bleed. Call your doctor or health care professional if you notice any unusual bleeding. Be careful brushing  and flossing your teeth or using a toothpick because you may get an infection or bleed more easily. If you have any dental work done, tell your dentist you are receiving this medicine. Avoid taking products that contain aspirin, acetaminophen, ibuprofen, naproxen, or ketoprofen unless instructed by your doctor. These medicines may hide a fever. Do not become pregnant while taking this medicine. Women should inform their doctor if they wish to become pregnant or think they might be pregnant. There is a potential for  serious side effects to an unborn child. Talk to your health care professional or pharmacist for more information. Do not breast-feed an infant while taking this medicine. Men should inform their doctor if they wish to father a child. This medicine may lower sperm counts. Do not treat diarrhea with over the counter products. Contact your doctor if you have diarrhea that lasts more than 2 days or if it is severe and watery. This medicine can make you more sensitive to the sun. Keep out of the sun. If you cannot avoid being in the sun, wear protective clothing and use sunscreen. Do not use sun lamps or tanning beds/booths. What side effects may I notice from receiving this medicine? Side effects that you should report to your doctor or health care professional as soon as possible: -allergic reactions like skin rash, itching or hives, swelling of the face, lips, or tongue -low blood counts - this medicine may decrease the number of white blood cells, red blood cells and platelets. You may be at increased risk for infections and bleeding. -signs of infection - fever or chills, cough, sore throat, pain or difficulty passing urine -signs of decreased platelets or bleeding - bruising, pinpoint red spots on the skin, black, tarry stools, blood in the urine -signs of decreased red blood cells - unusually weak or tired, fainting spells, lightheadedness -breathing problems -changes in vision -chest pain -mouth sores -nausea and vomiting -pain, swelling, redness at site where injected -pain, tingling, numbness in the hands or feet -redness, swelling, or sores on hands or feet -stomach pain -unusual bleeding Side effects that usually do not require medical attention (report to your doctor or health care professional if they continue or are bothersome): -changes in finger or toe nails -diarrhea -dry or itchy skin -hair loss -headache -loss of appetite -sensitivity of eyes to the light -stomach  upset -unusually teary eyes This list may not describe all possible side effects. Call your doctor for medical advice about side effects. You may report side effects to FDA at 1-800-FDA-1088. Where should I keep my medicine? This drug is given in a hospital or clinic and will not be stored at home. NOTE: This sheet is a summary. It may not cover all possible information. If you have questions about this medicine, talk to your doctor, pharmacist, or health care provider.  2015, [employer] Standard. (2008-03-10 13:53:16) Fluorouracil, 5-FU injection What is this medicine? FLUOROURACIL, 5-FU (flure oh YOOR a sil) is a chemotherapy drug. It slows the growth of cancer cells. This medicine is used to treat many types of cancer like breast cancer, colon or rectal cancer, pancreatic cancer, and stomach cancer. This medicine may be used for other purposes; ask your health care provider or pharmacist if you have questions. COMMON BRAND NAME(S): Adrucil What should I tell my health care provider before I take this medicine? They need to know if you have any of these conditions: -blood disorders -dihydropyrimidine dehydrogenase (DPD) deficiency -infection (especially a virus infection such as  chickenpox, cold sores, or herpes) -kidney disease -liver disease -malnourished, poor nutrition -recent or ongoing radiation therapy -an unusual or allergic reaction to fluorouracil, other chemotherapy, other medicines, foods, dyes, or preservatives -pregnant or trying to get pregnant -breast-feeding How should I use this medicine? This drug is given as an infusion or injection into a vein. It is administered in a hospital or clinic by a specially trained health care professional. Talk to your pediatrician regarding the use of this medicine in children. Special care may be needed. Overdosage: If you think you have taken too much of this medicine contact a poison control center or emergency room at  once. NOTE: This medicine is only for you. Do not share this medicine with others. What if I miss a dose? It is important not to miss your dose. Call your doctor or health care professional if you are unable to keep an appointment. What may interact with this medicine? -allopurinol -cimetidine -dapsone -digoxin -hydroxyurea -leucovorin -levamisole -medicines for seizures like ethotoin, fosphenytoin, phenytoin -medicines to increase blood counts like filgrastim, pegfilgrastim, sargramostim -medicines that treat or prevent blood clots like warfarin, enoxaparin, and dalteparin -methotrexate -metronidazole -pyrimethamine -some other chemotherapy drugs like busulfan, cisplatin, estramustine, vinblastine -trimethoprim -trimetrexate -vaccines Talk to your doctor or health care professional before taking any of these medicines: -acetaminophen -aspirin -ibuprofen -ketoprofen -naproxen This list may not describe all possible interactions. Give your health care provider a list of all the medicines, herbs, non-prescription drugs, or dietary supplements you use. Also tell them if you smoke, drink alcohol, or use illegal drugs. Some items may interact with your medicine. What should I watch for while using this medicine? Visit your doctor for checks on your progress. This drug may make you feel generally unwell. This is not uncommon, as chemotherapy can affect healthy cells as well as cancer cells. Report any side effects. Continue your course of treatment even though you feel ill unless your doctor tells you to stop. In some cases, you may be given additional medicines to help with side effects. Follow all directions for their use. Call your doctor or health care professional for advice if you get a fever, chills or sore throat, or other symptoms of a cold or flu. Do not treat yourself. This drug decreases your body's ability to fight infections. Try to avoid being around people who are  sick. This medicine may increase your risk to bruise or bleed. Call your doctor or health care professional if you notice any unusual bleeding. Be careful brushing and flossing your teeth or using a toothpick because you may get an infection or bleed more easily. If you have any dental work done, tell your dentist you are receiving this medicine. Avoid taking products that contain aspirin, acetaminophen, ibuprofen, naproxen, or ketoprofen unless instructed by your doctor. These medicines may hide a fever. Do not become pregnant while taking this medicine. Women should inform their doctor if they wish to become pregnant or think they might be pregnant. There is a potential for serious side effects to an unborn child. Talk to your health care professional or pharmacist for more information. Do not breast-feed an infant while taking this medicine. Men should inform their doctor if they wish to father a child. This medicine may lower sperm counts. Do not treat diarrhea with over the counter products. Contact your doctor if you have diarrhea that lasts more than 2 days or if it is severe and watery. This medicine can make you more sensitive to the  sun. Keep out of the sun. If you cannot avoid being in the sun, wear protective clothing and use sunscreen. Do not use sun lamps or tanning beds/booths. What side effects may I notice from receiving this medicine? Side effects that you should report to your doctor or health care professional as soon as possible: -allergic reactions like skin rash, itching or hives, swelling of the face, lips, or tongue -low blood counts - this medicine may decrease the number of white blood cells, red blood cells and platelets. You may be at increased risk for infections and bleeding. -signs of infection - fever or chills, cough, sore throat, pain or difficulty passing urine -signs of decreased platelets or bleeding - bruising, pinpoint red spots on the skin, black, tarry stools,  blood in the urine -signs of decreased red blood cells - unusually weak or tired, fainting spells, lightheadedness -breathing problems -changes in vision -chest pain -mouth sores -nausea and vomiting -pain, swelling, redness at site where injected -pain, tingling, numbness in the hands or feet -redness, swelling, or sores on hands or feet -stomach pain -unusual bleeding Side effects that usually do not require medical attention (report to your doctor or health care professional if they continue or are bothersome): -changes in finger or toe nails -diarrhea -dry or itchy skin -hair loss -headache -loss of appetite -sensitivity of eyes to the light -stomach upset -unusually teary eyes This list may not describe all possible side effects. Call your doctor for medical advice about side effects. You may report side effects to FDA at 1-800-FDA-1088. Where should I keep my medicine? This drug is given in a hospital or clinic and will not be stored at home. NOTE: This sheet is a summary. It may not cover all possible information. If you have questions about this medicine, talk to your doctor, pharmacist, or health care provider.  2015, Elsevier/Gold Standard. (2008-03-10 13:53:16)

## 2015-08-16 ENCOUNTER — Telehealth (HOSPITAL_COMMUNITY): Payer: Self-pay | Admitting: *Deleted

## 2015-08-16 ENCOUNTER — Ambulatory Visit (HOSPITAL_COMMUNITY): Payer: Commercial Managed Care - HMO | Admitting: Hematology & Oncology

## 2015-08-16 NOTE — Telephone Encounter (Signed)
Contacted for 24 hour f/u post first chemotherapy treatment.  Pt reports he is "doing great" and denies any side effects.

## 2015-08-17 ENCOUNTER — Encounter (HOSPITAL_BASED_OUTPATIENT_CLINIC_OR_DEPARTMENT_OTHER): Payer: Commercial Managed Care - HMO

## 2015-08-17 ENCOUNTER — Encounter (HOSPITAL_COMMUNITY): Payer: Self-pay

## 2015-08-17 DIAGNOSIS — C20 Malignant neoplasm of rectum: Secondary | ICD-10-CM

## 2015-08-17 MED ORDER — SODIUM CHLORIDE 0.9 % IJ SOLN
10.0000 mL | INTRAMUSCULAR | Status: DC | PRN
  Administered 2015-08-17: 10 mL via INTRAVENOUS
  Filled 2015-08-17: qty 10

## 2015-08-17 MED ORDER — HEPARIN SOD (PORK) LOCK FLUSH 100 UNIT/ML IV SOLN
500.0000 [IU] | Freq: Once | INTRAVENOUS | Status: AC
  Administered 2015-08-17: 500 [IU] via INTRAVENOUS

## 2015-08-17 NOTE — Progress Notes (Signed)
1230:  [name] presents to have home infusion pump d/c'd and for port-a-cath deaccess with flush.  Good blood return present. Portacath flushed with NS and 500U/85ml Heparin, and needle removed intact.  Procedure tolerated well and without incident.

## 2015-08-17 NOTE — Patient Instructions (Signed)
[place] at [facility] Discharge Instructions  RECOMMENDATIONS MADE BY THE CONSULTANT AND ANY TEST RESULTS WILL BE SENT TO YOUR REFERRING PHYSICIAN.  Infusion pump removal and port flush today. Return as scheduled for office visit and chemotherapy.   Thank you for choosing [place] at [facility] to provide your oncology and hematology care.  To afford each patient quality time with our provider, please arrive at least 15 minutes before your scheduled appointment time.    You need to re-schedule your appointment should you arrive 10 or more minutes late.  We strive to give you quality time with our providers, and arriving late affects you and other patients whose appointments are after yours.  Also, if you no show three or more times for appointments you may be dismissed from the clinic at the providers discretion.     Again, thank you for choosing [facility].  Our hope is that these requests will decrease the amount of time that you wait before being seen by our physicians.       _____________________________________________________________  Should you have questions after your visit to [facility], please contact our office at [phone] between the hours of 8:30 a.m. and 4:30 p.m.  Voicemails left after 4:30 p.m. will not be returned until the following business day.  For prescription refill requests, have your pharmacy contact our office.

## 2015-08-17 NOTE — Patient Outreach (Signed)
[facility]) Care Management  08/17/2015  [name] [birth year] [id number]   Referral from [place], assigned to Mariann Laster, RN [place] area) for patient outreach.  Damita L. Rhodie, [place] Care Management Assistant

## 2015-08-19 ENCOUNTER — Other Ambulatory Visit: Payer: Self-pay

## 2015-08-19 ENCOUNTER — Ambulatory Visit (HOSPITAL_COMMUNITY): Payer: Commercial Managed Care - HMO | Admitting: Hematology & Oncology

## 2015-08-19 DIAGNOSIS — I10 Essential (primary) hypertension: Secondary | ICD-10-CM

## 2015-08-19 NOTE — Patient Outreach (Signed)
[facility]) Care Management  08/19/2015  [name] 1961/07/05 [id number]  Screening Note:   Referral Date:  08/17/15 Referral Source:  [place] Referral Issue:  Cancer [employer] Medicare HMO [id number] PCP:  Dr. Rosita Fire  Outreach call #1 to patient.  Patient reached.  Screening completed.   Social:  Lives in his home; divorced and has 1 child.  Patient states he lives with other family members:  6 people living in the home.   Caregiver:    Good friend/ [name].  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    DME:  Cane, CPap (2006), glucometer/Accu-chek (07/2015)   Resource:  Food Stamps,  SSD (2006) due to back and left leg issues.    HTN (2010) Patient is taking BP medication everyday.    Diabetes (2010) Patient is taking no medication to manage blood sugar. BS readings:  Checks 1-2 times a day.  A1c:  6.3 on 06/30/15 Glucometer:  Accu-check (07/2015).   Cancer:  Stage 3 Rectal Cancer Chemo: yes.  Patient started first Chemo tx this week and plans approximately 24 treatments.  Denies any side effects thus far and feeling ok.    Medications Patient taking less than 10 medications.   Most have no co-pay; states using [employer] mail order for most medications except medications like pain meds that he can only get a 30 day quantity.  States he is paying out of pocket for his pain medications due to the difficulty getting his pain medication: Tramadol and oxycodone filled at the local pharmacy rather than the mail order.   Consent: Patient agreed to [facility] services.   Plan  Patient agreed to the following POC.  Medications: RN CM sent [place] Referral. -patient paying for pain medication out of pocket due to only 30 day order and unable to get 90 day supply.   -please assess why pain medication is not covered and if pharmacy issue with filling for coverage. -Patient needs advocate to assist with resolution.   H/o patient  does not always have co-pay for MD/specialist appt and this may help free up money to cover MD visits.   RN CM sent Referral  to [place] for "Extra Help" Medicare Application (3/57/01).  Consent   RN CM advised to please notify MD of any changes in condition prior to scheduled appt's.   RN CM provided contact name and #, 24-hour nurse line # [phone].   RN CM confirmed patient is aware of 911 services for urgent emergency needs. RN CM advised in next follow-call within 30 days with Telephonic [facility] RN CM for Initial Assessment. RN CM notified [place] Management Assistant: agreed to services; case opened.  RN CM mailed successful outreach letter, [facility] package and consent for service form to patient.   Mariann Laster, RN, BSN, [facility], CCM  [employer] Management Coordinator [phone] Direct [phone] Cell [phone] Office [phone] Fax

## 2015-08-22 ENCOUNTER — Telehealth (HOSPITAL_COMMUNITY): Payer: Self-pay | Admitting: *Deleted

## 2015-08-22 NOTE — Telephone Encounter (Signed)
Patient is doing good. Patient has been able to go back to drinking cool liquids. No symptoms from 1st chemo treatment. Patient is happy about the way he is doing. Appetite good.

## 2015-08-22 NOTE — Patient Outreach (Signed)
[facility]) Care Management  08/22/2015  [name] 1961-02-17 [id number]   Request from Mariann Laster, RN to assign Pharmacy, assigned Deanne Coffer, PharmD.  Thanks, [name]. [place], [place] Assistant Phone: [phone] Fax: [phone]

## 2015-08-24 ENCOUNTER — Inpatient Hospital Stay (HOSPITAL_COMMUNITY): Payer: Commercial Managed Care - HMO

## 2015-08-24 ENCOUNTER — Ambulatory Visit (HOSPITAL_COMMUNITY): Payer: Commercial Managed Care - HMO | Admitting: Hematology & Oncology

## 2015-08-24 ENCOUNTER — Encounter (HOSPITAL_COMMUNITY): Payer: Self-pay | Admitting: Hematology & Oncology

## 2015-08-24 ENCOUNTER — Encounter (HOSPITAL_COMMUNITY): Payer: Commercial Managed Care - HMO | Attending: Hematology & Oncology | Admitting: Hematology & Oncology

## 2015-08-24 VITALS — BP 118/83 | HR 94 | Temp 98.0°F | Resp 18 | Wt 208.2 lb

## 2015-08-24 DIAGNOSIS — C20 Malignant neoplasm of rectum: Secondary | ICD-10-CM | POA: Diagnosis not present

## 2015-08-24 DIAGNOSIS — C779 Secondary and unspecified malignant neoplasm of lymph node, unspecified: Secondary | ICD-10-CM | POA: Diagnosis not present

## 2015-08-24 MED ORDER — OXYCODONE HCL 10 MG PO TABS: ORAL_TABLET | ORAL | Status: DC

## 2015-08-24 NOTE — Patient Instructions (Signed)
..  [place] at [facility] Discharge Instructions  RECOMMENDATIONS MADE BY THE CONSULTANT AND ANY TEST RESULTS WILL BE SENT TO YOUR REFERRING PHYSICIAN.  Referral back to Dr. [name] @ [place] [facility] 11/11 @ 12 noon  Diarrhea protocol discussed with patient per Dr. Whitney Muse  Thank you for choosing [place] at [facility] to provide your oncology and hematology care.  To afford each patient quality time with our provider, please arrive at least 15 minutes before your scheduled appointment time.    You need to re-schedule your appointment should you arrive 10 or more minutes late.  We strive to give you quality time with our providers, and arriving late affects you and other patients whose appointments are after yours.  Also, if you no show three or more times for appointments you may be dismissed from the clinic at the providers discretion.     Again, thank you for choosing [facility].  Our hope is that these requests will decrease the amount of time that you wait before being seen by our physicians.       _____________________________________________________________  Should you have questions after your visit to [facility], please contact our office at [phone] between the hours of 8:30 a.m. and 4:30 p.m.  Voicemails left after 4:30 p.m. will not be returned until the following business day.  For prescription refill requests, have your pharmacy contact our office.

## 2015-08-24 NOTE — Progress Notes (Signed)
[place] at [facility] Progress Note  Patient Care Team: Rosita Fire, MD as PCP - General (Internal Medicine) Daneil Dolin, MD as Consulting Physician (Gastroenterology) Standley Brooking, RN as [place] Management  CHIEF COMPLAINTS/PURPOSE OF CONSULTATION:  Stage IIIA Rectal carcinoma Robotic assisted LAR, rigid proctoscopy with Dr. [name] 6/60/6301 Lymph nodes: number examined 14; number positive: 1 Pathologic Staging: pT1, pN1a, pMX Normal preoperative CEA Adjuvant FOLFOX Plans for concurrent XRT/5-FU  HISTORY OF PRESENTING ILLNESS:  [name] 54 y.o. male is here for follow-up of his rectal cancer. He has undergone definitive surgical therapy and did remarkably well. Unfortunately he did have one lymph node that contained metastatic disease. He notes he is pretty suspicious he needs more therapy. CT imaging prior to surgery revealed no evidence of metastatic disease. He has started chemotherapy with FOLFOX, we plan on "sandwiching" in infusional 5-FU/XRT. Plan is for 6 months adjuvant therapy.   He notes that his mood is normal; he has both good and bad days.  He is still active and states that he rests when necessary.  His port was put in Tuesday.  He experienced diarrhea last week causing him to have movements ever 15 minutes. He states it went away later in the day, he denies using immodium.   MEDICAL HISTORY:  Past Medical History  Diagnosis Date  . Hypertension   . High cholesterol   . Sleep apnea   . Rectal cancer ([place])   . Depression   . Arthritis   . Diabetes mellitus     "Borderline"  . GERD (gastroesophageal reflux disease)     No weakness  . Neuropathy ([place])     "back missed up"    SURGICAL HISTORY: Past Surgical History  Procedure Laterality Date  . Back surgery    . Cholecystectomy    . Colonoscopy with propofol N/A 03/31/2015    Procedure: COLONOSCOPY WITH PROPOFOL at cecum 0842; withdrawal  time=11mnutes;  Surgeon: RDaneil Dolin MD;  Location: [facility];  Service: Endoscopy;  Laterality: N/A;  . Polypectomy  03/31/2015    Procedure: POLYPECTOMY;  Surgeon: RDaneil Dolin MD;  Location: [facility];  Service: Endoscopy;;  . Esophageal biopsy  03/31/2015    Procedure: BIOPSY;  Surgeon: RDaneil Dolin MD;  Location: [facility];  Service: Endoscopy;;  . Flexible sigmoidoscopy N/A 07/05/2015    Procedure: FLEXIBLE SIGMOIDOSCOPY;  Surgeon: ALeighton Ruff MD;  Location: [facility];  Service: Endoscopy;  Laterality: N/A;  with tattoo  . Xi robotic assisted lower anterior resection N/A 07/06/2015    Procedure: XI ROBOTIC ASSISTED LOWER ANTERIOR RESECTION, rigid proctoscopy;  Surgeon: ALeighton Ruff MD;  Location: [facility];  Service: General;  Laterality: N/A;  . Portacath placement N/A 08/09/2015    Procedure: INSERTION PORT-A-CATH LEFT SUBCLAVIAN;  Surgeon: ALeighton Ruff MD;  Location: [place]  Service: General;  Laterality: N/A;    SOCIAL HISTORY: Social History   Social History  . Marital Status: Legally Separated    Spouse Name: N/A  . Number of Children: N/A  . Years of Education: N/A   Occupational History  . Not on file.   Social History Main Topics  . Smoking status: Former Smoker    Quit date: 03/23/2006  . Smokeless tobacco: Never Used  . Alcohol Use: No  . Drug Use: No  . Sexual Activity: Not on file   Other Topics Concern  . 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 Colon cancer Neg Hx     "Not that I know of"  . Colon polyps Neg Hx     "not that I know of"  . Hypertension      "Not sure who, I don't know much about my family"  . Diabetes      "Not sure who, I don't know much about my family"   has no family status information on file.  Mother deceased, 40, liver  cancer Father living, 68 3 brothers 1 sister  ALLERGIES:  has No Known Allergies.  MEDICATIONS:  Current Outpatient Prescriptions  Medication Sig Dispense Refill  . ACCU-CHEK SMARTVIEW test strip     . amLODipine (NORVASC) 5 MG tablet Take 5 mg by mouth every morning.     Marland Kitchen dextrose 5 % SOLN 1,000 mL with fluorouracil 5 GM/100ML SOLN Inject into the vein. To be given every 14 days x 12 cycles. To run for 46 hours. Has not started yet.    . gabapentin (NEURONTIN) 800 MG tablet Take 800 mg by mouth 3 (three) times daily.     Marland Kitchen LEUCOVORIN CALCIUM IJ Inject as directed. To be given every 14 days x 12 cycles. Has not started yet.    . lidocaine-prilocaine (EMLA) cream Apply a quarter size amount to port site 1 hour prior to chemo. Do not rub in. Cover with plastic wrap. 30 g 3  . lisinopril (PRINIVIL,ZESTRIL) 40 MG tablet Take 40 mg by mouth every morning.     . NON FORMULARY PT HAS A C-PAP MACHINE    . nortriptyline (PAMELOR) 75 MG capsule Take 75 mg by mouth at bedtime.      . OXALIPLATIN IV Inject into the vein. To be given every 14 days x 12 cycles. Has not started yet.    Marland Kitchen tiZANidine (ZANAFLEX) 2 MG tablet Take 2 mg by mouth every 8 (eight) hours as needed for muscle spasms.    Marland Kitchen dexamethasone (DECADRON) 4 MG tablet Take 2 tablets (8 mg total) by mouth 2 (two) times daily with a meal. Start the day after chemotherapy for 2 days. Take with food. 30 tablet 1  . LORazepam (ATIVAN) 0.5 MG tablet Take 1 tablet (0.5 mg total) by mouth every 6 (six) hours as needed (Nausea or vomiting). (Patient not taking: Reported on 08/24/2015) 30 tablet 0  . ondansetron (ZOFRAN) 8 MG tablet Take 1 tablet (8 mg total) by mouth 2 (two) times daily. Start the day after chemo for 2 days. Then take as needed for nausea or vomiting. (Patient not taking: Reported on 08/24/2015) 30 tablet 1  . Oxycodone HCl 10 MG TABS Take one tablet as needed every 4 hours for pain (Patient not taking: Reported on 08/24/2015) 45 tablet 0   . prochlorperazine (COMPAZINE) 10 MG tablet Take 1 tablet (10 mg total) by mouth every 6 (six) hours as needed (Nausea or vomiting). (Patient not taking: Reported on 08/24/2015) 30 tablet 1  . traMADol (ULTRAM) 50 MG tablet Take 50 mg by mouth every 6 (six) hours as needed for moderate pain.      No current facility-administered medications for this visit.    Review of Systems  Constitutional: Negative for fever, chills, weight loss and malaise/fatigue.  HENT: Negative for congestion, hearing loss, nosebleeds, sore throat and  tinnitus.   Eyes: Negative for blurred vision, double vision, pain and discharge.  Respiratory: Negative for cough, hemoptysis, sputum production, shortness of breath and wheezing.   Cardiovascular: Negative for chest pain, palpitations, claudication, leg swelling and PND.  Gastrointestinal: Negative for heartburn, nausea, vomiting, abdominal pain, diarrhea, constipation, blood in stool and melena.  Genitourinary: Negative for dysuria, urgency, frequency and hematuria.  Musculoskeletal: Negative for myalgias, joint pain and falls.  Skin: Negative for itching and rash.  Neurological: Negative for dizziness, tingling, tremors, sensory change, speech change, focal weakness, seizures, loss of consciousness, weakness and headaches.  Endo/Heme/Allergies: Does not bruise/bleed easily.  Psychiatric/Behavioral: Negative for depression, suicidal ideas, memory loss and substance abuse. The patient is not nervous/anxious and does not have insomnia.   All other systems reviewed and are negative.  14 point ROS was done and is otherwise as detailed above or in HPI   PHYSICAL EXAMINATION: ECOG PERFORMANCE STATUS: 0 - Asymptomatic  Filed Vitals:   08/24/15 1055  BP: 118/83  Pulse: 94  Temp: 98 F (36.7 C)  Resp: 18   Filed Weights   08/24/15 1055  Weight: 208 lb 3.2 oz (94.439 kg)    Physical Exam  Constitutional: He is oriented to person, place, and time and  well-developed, well-nourished, and in no distress.  HENT:  Head: Normocephalic and atraumatic.  Nose: Nose normal.  Mouth/Throat: Oropharynx is clear and moist. No oropharyngeal exudate.  Eyes: Conjunctivae and EOM are normal. Pupils are equal, round, and reactive to light. Right eye exhibits no discharge. Left eye exhibits no discharge. No scleral icterus.  Neck: Normal range of motion. Neck supple. No tracheal deviation present. No thyromegaly present.  Cardiovascular: Normal rate, regular rhythm and normal heart sounds.  Exam reveals no gallop and no friction rub.   No murmur heard. Pulmonary/Chest: Effort normal and breath sounds normal. He has no wheezes. He has no rales.  Abdominal: Soft. Bowel sounds are normal. He exhibits no distension and no mass. There is no tenderness. There is no rebound and no guarding.  well-healing surgical incision sites  Genitourinary:  Musculoskeletal: Normal range of motion. He exhibits no edema.  Lymphadenopathy:    He has no cervical adenopathy.  Neurological: He is alert and oriented to person, place, and time. He has normal reflexes. No cranial nerve deficit. Gait normal. Coordination normal.  Skin: Skin is warm and dry. No rash noted.  Psychiatric: Mood, memory, affect and judgment normal.  Nursing note and vitals reviewed.   LABORATORY DATA:  I have reviewed the data as listed Results for IVAL, PACER (MRN [id number])   Ref. Range 08/15/2015 16:51  Sodium Latest Ref Range: 135-145 mmol/L 139  Potassium Latest Ref Range: 3.5-5.1 mmol/L 3.9  Chloride Latest Ref Range: 101-111 mmol/L 104  CO2 Latest Ref Range: 22-32 mmol/L 24  BUN Latest Ref Range: 6-20 mg/dL 12  Creatinine Latest Ref Range: 0.61-1.24 mg/dL 0.99  Calcium Latest Ref Range: 8.9-10.3 mg/dL 9.1  EGFR (Non-African Amer.) Latest Ref Range: >60 mL/min >60  EGFR (African American) Latest Ref Range: >60 mL/min >60  Glucose Latest Ref Range: 65-99 mg/dL 70  Anion gap Latest Ref Range:  5-15  11  Alkaline Phosphatase Latest Ref Range: 38-126 U/L 70  Albumin Latest Ref Range: 3.5-5.0 g/dL 4.1  AST Latest Ref Range: 15-41 U/L 27  ALT Latest Ref Range: 17-63 U/L 25  Total Protein Latest Ref Range: 6.5-8.1 g/dL 8.1  Total Bilirubin Latest Ref Range: 0.3-1.2 mg/dL 0.4  WBC Latest Ref  Range: 4.0-10.5 K/uL 5.7  RBC Latest Ref Range: 4.22-5.81 MIL/uL 4.20 (L)  Hemoglobin Latest Ref Range: 13.0-17.0 g/dL 12.0 (L)  HCT Latest Ref Range: 39.0-52.0 % 37.4 (L)  MCV Latest Ref Range: 78.0-100.0 fL 89.0  MCH Latest Ref Range: 26.0-34.0 pg 28.6  MCHC Latest Ref Range: 30.0-36.0 g/dL 32.1  RDW Latest Ref Range: 11.5-15.5 % 13.1  Platelets Latest Ref Range: 150-400 K/uL 204  Neutrophils Latest Units: % 55  Lymphocytes Latest Units: % 29  Monocytes Relative Latest Units: % 6  Eosinophil Latest Units: % 9  Basophil Latest Units: % 1  NEUT# Latest Ref Range: 1.7-7.7 K/uL 3.1  Lymphocyte # Latest Ref Range: 0.7-4.0 K/uL 1.7  Monocyte # Latest Ref Range: 0.1-1.0 K/uL 0.4  Eosinophils Absolute Latest Ref Range: 0.0-0.7 K/uL 0.5  Basophils Absolute Latest Ref Range: 0.0-0.1 K/uL 0.0    PATHOLOGY:REPORT OF SURGICAL PATHOLOGY ADDITIONAL INFORMATION: 1. Mismatch Repair (MMR) Protein Immunohistochemistry (IHC) IHC Expression Result: MLH1: Preserved nuclear expression (greater 50% tumor expression) MSH2: Preserved nuclear expression (greater 50% tumor expression) MSH6: Preserved nuclear expression (greater 50% tumor expression) PMS2: Preserved nuclear expression (greater 50% tumor expression) * Internal control demonstrates intact nuclear expression Interpretation: NORMAL There is preserved expression of the major and minor MMR proteins. There is a very low probability that microsatellite instability (MSI) is present. However, certain clinically significant MMR protein mutations may result in preservation of nuclear expression. It is recommended that the preservation of protein expression  be correlated with molecular based MSI testing. References: 1. Guidelines on Genetic Evaluation and Management of Lynch Syndrome: A Consensus Statement by the [place] Multi-Society Task Force on Colorectal Cancer Gae Dry. Sherlie Ban , MD, and others . Am Nicki [place] 2014; [phone]; doi: 10.1038/ajg.2014.186; published online 09 June 2013 2. Outcomes of screening endometrial cancer patients for Lynch syndrome by patient-administered checklist. Olena Heckle MS, and others. Gynecol Oncol 2013;131(3):619-623. [place] RUND DO Pathologist, Electronic Signature ( Signed 07/11/2015) FINAL DIAGNOSIS Diagnosis 1. Colon, segmental resection for tumor, rectosigmoid - INVASIVE ADENOCARCINOMA, SEE COMMENT. - TUMOR INVADES INTO SUBMUCOSA. 1 of 4 FINAL for ASHOK, SAWAYA A [id number]) Diagnosis(continued) - ONE LYMPH NODE, POSITIVE FOR METASTATIC TUMOR (1/14). - SURGICAL MARGINS, NEGATIVE FOR TUMOR. - SEE TUMOR SYNOPTIC TEMPLATE BELOW. 2. Colon, resection margin (donut), final distal margin - BENIGN COLON. - NEGATIVE FOR DYSPLASIA OR MALIGNANCY. Microscopic Comment 1. COLON AND RECTUM (INCLUDING TRANS-ANAL RESECTION): Specimen: Rectosigmoid Procedure: Resection Tumor site: Mid rectum Specimen integrity: Intact Macroscopic intactness of mesorectum: Near complete Macroscopic tumor perforation: Absent Invasive tumor: Maximum size: 2.0 cm Histologic type(s): Adenocarcinoma Histologic grade and differentiation: G2: moderately differentiated/low grade Type of polyp in which invasive carcinoma arose: Adenoma with high grade dysplasia Microscopic extension of invasive tumor: Tumor extends into submucosa and is immediately adjacent to muscularis propria. Lymph-Vascular invasion: Present Peri-neural invasion: Present Tumor deposit(s) (discontinuous extramural extension): Absent Resection margins: Proximal margin: Negative Distal margin: Negative Circumferential (radial) (posterior ascending,  posterior descending; lateral and posterior mid-rectum; and entire lower 1/3 rectum): Negative Mesenteric margin (sigmoid and transverse): Negative Distance closest margin (if all above margins negative): 1.0 cm (distal) Treatment effect (neo-adjuvant therapy): None Additional polyp(s): None Non-neoplastic findings: None Lymph nodes: number examined 14; number positive: 1 Pathologic Staging: pT1, pN1a, pMX Ancillary studies: Per the [place] Gastrointestinal Oncology Working group Guidelines, tumor will be submitted for both microsatellite instability by PCR and mismatch repair protein expression by immunohistochemistry. The results will be reported in an addendum. (CR:kh 07-08-15) [place]i RUND DO Pathologist, Electronic Signature (Case signed  07/08/2015) Specimen Gross and Clinical Information 2 of 4 FINAL for SHAHIR, KAREN A [id number]) Specimen(s) Obtained: 1. Colon, segmental resection for tumor, rectosigmoid 2. Colon, resection margin (donut), final distal margin Specimen Clinical Information 1. rectal cancer (kp) Gross 1. Specimen: Rectosigmoid, to include at least middle third of rectum. Specimen integrity: Intact. Specimen length: 31 cm. Mesorectal intactness: Near complete. Tumor location: Middle third of rectum, posterior wall, 6.0 cm from the sigmorectal junction. Tumor size: 2.0 cm in diameter tan red indurated, sessile ill-defined mass. Percent of bowel circumference involved: Approximately 20%. Tumor distance to margins: Proximal: 28 cm. Distal: 1 cm. Mesenteric (sigmoid and transverse): 11 cm. Radial (posterior ascending, posterior descending; lateral and posterior mid-rectum; and entire lower 1/3 rectum): 0.5 cm to the mesorectal soft tissue margin. Macroscopic extent of tumor invasion: The tumor involves, but does not grossly completely transect the muscularis. Total presumed lymph nodes: 14 possible lymph nodes ranging from 0.1 to 0.8 cm. Extramural satellite  tumor nodules: None. Mucosal polyp(s): None. Additional findings: None. Block summary: A = shave of proximal margin B,C = shave of entire distal margin D-H = tumor, entirely submitted I = four nodes, whole J = four nodes, whole K = four nodes, whole L = two nodes, whole Total: 12 blocks submitted 2. Received in formalin is a 1.1 cm in diameter and 1.3 cm thick portion of tissue with tan to hyperemic, smooth mucosa on one end. Sectioned and entirely submitted in one block. (SW:ds 07/07/15) Stain(s) used in Diagnosis: The following stain(s) were used in diagnosing the case: MSH6, MLH1, MSH2, PMS2. The control(s) stained appropriately. Disclaimer Some of these immunohistochemical stains may have been developed and the performance characteristics determined by [facility]. Some may not have been cleared or approved by the U.S. Food and Drug Administration. The FDA has determined that such clearance or approval is not necessary. This test is used for clinical purposes. It should not be regarded as investigational or for research. This laboratory is certified under the [place] (CLIA-88) as qualified to perform high complexity clinical laboratory testing. Report signed out from the following location(s) Technical Component was performed at [facility]. [address],,[place] [address].EZMO:[id number]., 3 of 4 FINAL for EDRAS, WILFORD A [phone]) Report signed out from the following location(s)(continued) Technical Component was performed at [place], [place], [place] [address]. CLIA #: [id number], Technical component and interpretation was performed at [facility] [address], [place], [place] [address]. CLIA #: [id number], 4 of 4    ASSESSMENT & PLAN:  T1, N1, M0 adenocarcinoma of the rectum, Stage IIIA Robotic assisted LAR with Dr. [name] Normal preoperative CEA Adjuvant FOLFOX (will also receive infusion 5-FU/XRT)  He did well with his first cycle of chemotherapy.  He has no complaints today. He denies mouth sores, GERD, diarrhea as above. I did teaching today again on diarrhea with 5-FU based therapy. He was provided with another immodium sheet.  I emphasized the importance of promptly treating diarrhea during his therapy and calling us if immodium does not control it.  He will return for cycle #2 next week.   He needs a refill on his oxycodone today.  All questions were answered. The patient knows to call the clinic with any problems, questions or concerns.   This document serves as a record of services personally performed by Ancil Linsey, MD. It was created on her behalf by Janace Hoard, a trained  medical scribe. The creation of this record is based on the scribe's personal observations and the provider's statements to them. This document has been checked and approved by the attending provider.  I have reviewed the above documentation for accuracy and completeness, and I agree with the above.  This note was electronically signed.  Kelby Fam. Whitney Muse, MD

## 2015-08-25 ENCOUNTER — Other Ambulatory Visit: Payer: Self-pay | Admitting: Pharmacist

## 2015-08-25 NOTE — Patient Outreach (Signed)
[facility]) Care Management  [place]   08/25/2015  [name] 1961-06-26 [id number]  Subjective: [name] is a 54 yo referred to [place] from [place], [place], for medication assistance.  Per referral, patient is paying out of pocket for his pain medications (tramadol and hydrocodone) due to only 30 day order and unable to get a 90 day supply.    I call [name] to review his medications and discuss concerns about medication cost.  Patient is using [facility] mail order pharmacy and has zero co-pay for most medications.  He states he is only able to get a 30 day supply of his pain medications and he has to pay for these (either gets a bill from mail order or he pays for them at [place]). I explained to patient that by law, schedule II controlled substances such as hydrocodone or oxycodone can only be prescribed and filled for a 30 day supply.  Patient was under the impression that his insurance would only cover his prescriptions if they were 90-day supply.  I informed him that his insurance can be billed for his pain medications for 30-day supply; however, patient may still have a co-pay to pay for these pain medications since pain medications such as hydrocodone are tier 3 under his insurance plan.    Patient states that he currently has all prescribed medications and was able to afford all his medications at this time, but he states that he has a lot of medical expenses with medication co-pays, appointment co-pays, and hospital bills, and he states "my disability check only goes so far."   Objective:   Current Medications: Current Outpatient Prescriptions  Medication Sig Dispense Refill  . ACCU-CHEK SMARTVIEW test strip     . amLODipine (NORVASC) 5 MG tablet Take 5 mg by mouth every morning.     Marland Kitchen dexamethasone (DECADRON) 4 MG tablet Take 2 tablets (8 mg total) by mouth 2 (two) times daily with a meal. Start the day after  chemotherapy for 2 days. Take with food. 30 tablet 1  . dextrose 5 % SOLN 1,000 mL with fluorouracil 5 GM/100ML SOLN Inject into the vein. To be given every 14 days x 12 cycles. To run for 46 hours. Has not started yet.    . gabapentin (NEURONTIN) 800 MG tablet Take 800 mg by mouth 3 (three) times daily.     Marland Kitchen LEUCOVORIN CALCIUM IJ Inject as directed. To be given every 14 days x 12 cycles. Has not started yet.    . lidocaine-prilocaine (EMLA) cream Apply a quarter size amount to port site 1 hour prior to chemo. Do not rub in. Cover with plastic wrap. 30 g 3  . lisinopril (PRINIVIL,ZESTRIL) 40 MG tablet Take 40 mg by mouth every morning.     Marland Kitchen LORazepam (ATIVAN) 0.5 MG tablet Take 1 tablet (0.5 mg total) by mouth every 6 (six) hours as needed (Nausea or vomiting). (Patient not taking: Reported on 08/24/2015) 30 tablet 0  . NON FORMULARY PT HAS A C-PAP MACHINE    . nortriptyline (PAMELOR) 75 MG capsule Take 75 mg by mouth at bedtime.      . ondansetron (ZOFRAN) 8 MG tablet Take 1 tablet (8 mg total) by mouth 2 (two) times daily. Start the day after chemo for 2 days. Then take as needed for nausea or vomiting. (Patient not taking: Reported on 08/24/2015) 30 tablet 1  . OXALIPLATIN IV Inject into the  vein. To be given every 14 days x 12 cycles. Has not started yet.    . Oxycodone HCl 10 MG TABS Take one tablet as needed every 4 hours for pain 45 tablet 0  . prochlorperazine (COMPAZINE) 10 MG tablet Take 1 tablet (10 mg total) by mouth every 6 (six) hours as needed (Nausea or vomiting). (Patient not taking: Reported on 08/24/2015) 30 tablet 1  . tiZANidine (ZANAFLEX) 2 MG tablet Take 2 mg by mouth every 8 (eight) hours as needed for muscle spasms.    . traMADol (ULTRAM) 50 MG tablet Take 50 mg by mouth every 6 (six) hours as needed for moderate pain.      No current facility-administered medications for this visit.    Functional Status: In your present state of health, do you have any difficulty  performing the following activities: 08/09/2015 07/06/2015  Hearing? N N  Vision? N N  Difficulty concentrating or making decisions? N N  Walking or climbing stairs? N Y  Dressing or bathing? N N  Doing errands, shopping? - Y    Fall/Depression Screening: No flowsheet data found.  Assessment: 1.  Medication assistance:  Patient has prescription insurance coverage but reports difficulty paying for all of his medical expenses.  Patient may benefit from Extra Help for his medications.  I told patient the annual income cutoff to qualify for Extra Help and he is interested in applying.  He reports having all his medications at this time.    Plan: 1.  Will refer patient to [place] for assistance with Extra Help application.    Elisabeth Most, Pharm.D. Pharmacy Resident [place] [phone]

## 2015-08-26 ENCOUNTER — Encounter (HOSPITAL_COMMUNITY): Payer: Commercial Managed Care - HMO

## 2015-08-26 NOTE — Patient Outreach (Signed)
[facility]) Care Management  08/26/2015  [name] 1961/01/29 [id number]   I called [name] today to enroll him into Extra Help.  He was willing to do it over the phone.  We completed the application online and submitted it today.  He should hear from social security administration in the next 2 to 4 weeks.  He is going to call me when he hears from them. I will follow up in 4 weeks.  Tamieka Rancourt L. Evellyn Tuff, [place] Care Management Assistant

## 2015-08-28 NOTE — Assessment & Plan Note (Addendum)
Stage IIIA invasive adenocarcinoma of rectum with initial biopsy on 03/31/2015 followed by definitive resection revealing a T1N1a tumor on 07/06/2015.  Staging completed.  Oncology history developed.  Pre-chemo labs today: CBC diff, CMET  ANC is 1.6.  He meets treatment parameters.  Will add Onpro Neulasta today.  Added to treatment plan this AM.  Medications reviewed and updated in [facility].  Return in 2 weeks for pre-chemo labs, follow-up appointment, and next cycle of chemotherapy.

## 2015-08-28 NOTE — Progress Notes (Addendum)
Rosita Fire, MD [address] [place] [address]  Rectal cancer [facility])  CURRENT THERAPY: Adjuvant FOLFOX  INTERVAL HISTORY: EMERSON SCHREIFELS 54 y.o. male returns for followup of Stage IIIA rectal cancer.    Rectal cancer ([place])   03/31/2015 Initial Biopsy Rectum, biopsy, rectal mass - INVASIVE ADENOCARCINOMA   06/02/2015 Tumor Marker Results for TROY, KANOUSE (MRN [id number]) as of 08/28/2015 09:09  06/02/2015 16:00 CEA: 4.3    06/08/2015 Imaging CT abd/pelvis-Rectal primary, without evidence of metastatic disease or acute complication.   06/24/2015 Imaging CT chest- No specific features identified to suggest metastatic disease to the chest.   07/06/2015 Definitive Surgery Colon, segmental resection for tumor, rectosigmoid - INVASIVE ADENOCARCINOMA, SEE COMMENT. - TUMOR INVADES INTO SUBMUCOSA.   08/15/2015 -  Chemotherapy FOLFOX    I personally reviewed and went over laboratory results with the patient.  The results are noted within this dictation.  They will be updated today.  He tolerated his first cycle of chemotherapy without difficulties.    I reviewed his medications with him.  I have deleted Dexamethasone and Lorazepam from his medication list as they have both been accidentally added.    He denies any complaints today.  Past Medical History  Diagnosis Date  . Hypertension   . High cholesterol   . Sleep apnea   . Rectal cancer ([place])   . Depression   . Arthritis   . Diabetes mellitus     "Borderline"  . GERD (gastroesophageal reflux disease)     No weakness  . Neuropathy ([place])     "back missed up"    has Taking medication for chronic disease; Encounter for screening colonoscopy; History of colonic polyps; Diverticulosis of colon without hemorrhage; and Rectal cancer ([place]) on his problem list.     has No Known Allergies.  Current Outpatient Prescriptions on File Prior to Visit  Medication Sig Dispense Refill  . ACCU-CHEK SMARTVIEW test strip       . amLODipine (NORVASC) 5 MG tablet Take 5 mg by mouth every morning.     Marland Kitchen dextrose 5 % SOLN 1,000 mL with fluorouracil 5 GM/100ML SOLN Inject into the vein. To be given every 14 days x 12 cycles. To run for 46 hours. Has not started yet.    . gabapentin (NEURONTIN) 800 MG tablet Take 800 mg by mouth 3 (three) times daily.     Marland Kitchen LEUCOVORIN CALCIUM IJ Inject as directed. To be given every 14 days x 12 cycles. Has not started yet.    . lidocaine-prilocaine (EMLA) cream Apply a quarter size amount to port site 1 hour prior to chemo. Do not rub in. Cover with plastic wrap. 30 g 3  . lisinopril (PRINIVIL,ZESTRIL) 40 MG tablet Take 40 mg by mouth every morning.     . NON FORMULARY PT HAS A C-PAP MACHINE    . nortriptyline (PAMELOR) 75 MG capsule Take 75 mg by mouth at bedtime.      . ondansetron (ZOFRAN) 8 MG tablet Take 1 tablet (8 mg total) by mouth 2 (two) times daily. Start the day after chemo for 2 days. Then take as needed for nausea or vomiting. 30 tablet 1  . OXALIPLATIN IV Inject into the vein. To be given every 14 days x 12 cycles. Has not started yet.    . Oxycodone HCl 10 MG TABS Take one tablet as needed every 4 hours for pain 45 tablet 0  . prochlorperazine (COMPAZINE)  10 MG tablet Take 1 tablet (10 mg total) by mouth every 6 (six) hours as needed (Nausea or vomiting). 30 tablet 1  . tiZANidine (ZANAFLEX) 2 MG tablet Take 2 mg by mouth every 8 (eight) hours as needed for muscle spasms.    . traMADol (ULTRAM) 50 MG tablet Take 50 mg by mouth every 6 (six) hours as needed for moderate pain.      Current Facility-Administered Medications on File Prior to Visit  Medication Dose Route Frequency Provider Last Rate Last Dose  . dextrose 5 % solution   Intravenous Once Patrici Ranks, MD      . fluorouracil (ADRUCIL) 4,900 mg in sodium chloride 0.9 % 52 mL chemo infusion  2,400 mg/m2 (Treatment Plan Actual) Intravenous 1 day or 1 dose Patrici Ranks, MD      . fluorouracil (ADRUCIL) chemo  injection 800 mg  400 mg/m2 (Treatment Plan Actual) Intravenous Once Patrici Ranks, MD      . leucovorin 820 mg in dextrose 5 % 250 mL infusion  400 mg/m2 (Treatment Plan Actual) Intravenous Once Patrici Ranks, MD      . ondansetron (ZOFRAN) 8 mg, dexamethasone (DECADRON) 10 mg in sodium chloride 0.9 % 50 mL IVPB   Intravenous Once Patrici Ranks, MD      . oxaliplatin (ELOXATIN) 175 mg in dextrose 5 % 500 mL chemo infusion  85 mg/m2 (Treatment Plan Actual) Intravenous Once Patrici Ranks, MD      . sodium chloride 0.9 % injection 10 mL  10 mL Intracatheter PRN Patrici Ranks, MD        Past Surgical History  Procedure Laterality Date  . Back surgery    . Cholecystectomy    . Colonoscopy with propofol N/A 03/31/2015    Procedure: COLONOSCOPY WITH PROPOFOL at cecum 0842; withdrawal time=17minutes;  Surgeon: Daneil Dolin, MD;  Location: [facility];  Service: Endoscopy;  Laterality: N/A;  . Polypectomy  03/31/2015    Procedure: POLYPECTOMY;  Surgeon: Daneil Dolin, MD;  Location: [facility];  Service: Endoscopy;;  . Esophageal biopsy  03/31/2015    Procedure: BIOPSY;  Surgeon: Daneil Dolin, MD;  Location: [facility];  Service: Endoscopy;;  . Flexible sigmoidoscopy N/A 07/05/2015    Procedure: FLEXIBLE SIGMOIDOSCOPY;  Surgeon: [name], MD;  Location: [facility];  Service: Endoscopy;  Laterality: N/A;  with tattoo  . Xi robotic assisted lower anterior resection N/A 07/06/2015    Procedure: XI ROBOTIC ASSISTED LOWER ANTERIOR RESECTION, rigid proctoscopy;  Surgeon: [name], MD;  Location: [facility];  Service: General;  Laterality: N/A;  . Portacath placement N/A 08/09/2015    Procedure: INSERTION PORT-A-CATH LEFT SUBCLAVIAN;  Surgeon: [name], MD;  Location: [facility];  Service: General;  Laterality: N/A;    Denies any headaches, dizziness, double vision, fevers, chills, night sweats, nausea, vomiting, diarrhea, constipation, chest pain, heart palpitations, shortness of breath,  blood in stool, black tarry stool, urinary pain, urinary burning, urinary frequency, hematuria.   PHYSICAL EXAMINATION  ECOG PERFORMANCE STATUS: 0 - Asymptomatic  Filed Vitals:   08/29/15 0922  BP: 117/78  Temp: 98.5 F (36.9 C)  Resp: 18    GENERAL:alert, no distress, well nourished, well developed, comfortable, cooperative, obese, smiling and un accompanied SKIN: skin color, texture, turgor are normal, no rashes or significant lesions HEAD: Normocephalic, No masses, lesions, tenderness or abnormalities EYES: normal, PERRLA, EOMI, Conjunctiva are pink and non-injected EARS: External ears normal OROPHARYNX:no exudate, no erythema, lips,  buccal mucosa, and tongue normal, halitosis and mucous membranes are moist  NECK: supple, no adenopathy, thyroid normal size, non-tender, without nodularity, no stridor, non-tender, trachea midline LYMPH:  no palpable lymphadenopathy BREAST:not examined LUNGS: clear to auscultation  HEART: regular rate & rhythm ABDOMEN:abdomen soft, non-tender, obese and normal bowel sounds BACK: Back symmetric, no curvature., No CVA tenderness EXTREMITIES:less then 2 second capillary refill, no joint deformities, effusion, or inflammation, no skin discoloration, no clubbing, no cyanosis  NEURO: alert & oriented x 3 with fluent speech, no focal motor/sensory deficits, gait normal   LABORATORY DATA: CBC    Component Value Date/Time   WBC 3.6* 08/29/2015 0930   RBC 4.18* 08/29/2015 0930   HGB 12.1* 08/29/2015 0930   HCT 36.5* 08/29/2015 0930   PLT 162 08/29/2015 0930   MCV 87.3 08/29/2015 0930   MCH 28.9 08/29/2015 0930   MCHC 33.2 08/29/2015 0930   RDW 12.8 08/29/2015 0930   LYMPHSABS 1.2 08/29/2015 0930   MONOABS 0.4 08/29/2015 0930   EOSABS 0.3 08/29/2015 0930   BASOSABS 0.0 08/29/2015 0930      Chemistry      Component Value Date/Time   NA 138 08/29/2015 0930   K 3.9 08/29/2015 0930   CL 107 08/29/2015 0930   CO2 26 08/29/2015 0930   BUN 9  08/29/2015 0930   CREATININE 1.03 08/29/2015 0930      Component Value Date/Time   CALCIUM 8.7* 08/29/2015 0930   ALKPHOS 80 08/29/2015 0930   AST 28 08/29/2015 0930   ALT 30 08/29/2015 0930   BILITOT 0.4 08/29/2015 0930     Lab Results  Component Value Date   CEA 4.3 06/02/2015      PENDING LABS:   RADIOGRAPHIC STUDIES:  Dg Chest Port 1 View  08/09/2015   CLINICAL DATA:  Status post port placement  EXAM: PORTABLE CHEST - 1 VIEW  COMPARISON:  06/24/2015  FINDINGS: Cardiac shadow is mildly prominent accentuated by the portable technique. A new left chest wall port is noted with the catheter tip in the mid superior vena cava. Some slight downward depression is noted at the vein entry site which may be related to some extrinsic impingement although no deformity of the catheter is seen. The lungs are clear. No pneumothorax is noted.  IMPRESSION: No pneumothorax following port placement. Slight depression of the catheter is noted without significant deformity   Electronically Signed   By: Inez Catalina M.D.   On: 08/09/2015 09:27   Dg Fluoro Guide Cv Line-no Report  08/09/2015   CLINICAL DATA:    FLOURO GUIDE CV LINE  Fluoroscopy was utilized by the requesting physician.  No radiographic  interpretation.      PATHOLOGY:    ASSESSMENT AND PLAN:  Rectal cancer ([place]) Stage IIIA invasive adenocarcinoma of rectum with initial biopsy on 03/31/2015 followed by definitive resection revealing a T1N1a tumor on 07/06/2015.  Staging completed.  Oncology history developed.  Pre-chemo labs today: CBC diff, CMET  ANC is 1.6.  He meets treatment parameters.  Will add Onpro Neulasta today.  Added to treatment plan this AM.  Medications reviewed and updated in CH[facility].  Return in 2 weeks for pre-chemo labs, follow-up appointment, and next cycle of chemotherapy.    THERAPY PLAN:  Continue treatment as planned.  All questions were answered. The patient knows to call the clinic with any  problems, questions or concerns. We can certainly see the patient much sooner if necessary.  Patient and plan discussed with Dr.  Shannon Penland and she is in agreement with the aforementioned.   This note is electronically signed by: Doy Mince 08/29/2015 10:19 AM

## 2015-08-29 ENCOUNTER — Encounter (HOSPITAL_BASED_OUTPATIENT_CLINIC_OR_DEPARTMENT_OTHER): Payer: Commercial Managed Care - HMO | Admitting: Oncology

## 2015-08-29 ENCOUNTER — Encounter (HOSPITAL_BASED_OUTPATIENT_CLINIC_OR_DEPARTMENT_OTHER): Payer: Commercial Managed Care - HMO

## 2015-08-29 ENCOUNTER — Encounter (HOSPITAL_COMMUNITY): Payer: Self-pay | Admitting: Oncology

## 2015-08-29 VITALS — BP 117/78 | Temp 98.5°F | Resp 18 | Wt 211.3 lb

## 2015-08-29 VITALS — BP 138/88 | HR 93 | Temp 98.0°F | Resp 18

## 2015-08-29 DIAGNOSIS — C20 Malignant neoplasm of rectum: Secondary | ICD-10-CM

## 2015-08-29 DIAGNOSIS — Z5111 Encounter for antineoplastic chemotherapy: Secondary | ICD-10-CM

## 2015-08-29 LAB — CBC WITH DIFFERENTIAL/PLATELET
BASOS ABS: 0 10*3/uL (ref 0.0–0.1)
BASOS PCT: 0 %
EOS ABS: 0.3 10*3/uL (ref 0.0–0.7)
EOS PCT: 9 %
HCT: 36.5 % — ABNORMAL LOW (ref 39.0–52.0)
HEMOGLOBIN: 12.1 g/dL — AB (ref 13.0–17.0)
LYMPHS ABS: 1.2 10*3/uL (ref 0.7–4.0)
Lymphocytes Relative: 34 %
MCH: 28.9 pg (ref 26.0–34.0)
MCHC: 33.2 g/dL (ref 30.0–36.0)
MCV: 87.3 fL (ref 78.0–100.0)
Monocytes Absolute: 0.4 10*3/uL (ref 0.1–1.0)
Monocytes Relative: 12 %
NEUTROS PCT: 45 %
Neutro Abs: 1.6 10*3/uL — ABNORMAL LOW (ref 1.7–7.7)
PLATELETS: 162 10*3/uL (ref 150–400)
RBC: 4.18 MIL/uL — AB (ref 4.22–5.81)
RDW: 12.8 % (ref 11.5–15.5)
WBC: 3.6 10*3/uL — AB (ref 4.0–10.5)

## 2015-08-29 LAB — COMPREHENSIVE METABOLIC PANEL
ALBUMIN: 4 g/dL (ref 3.5–5.0)
ALK PHOS: 80 U/L (ref 38–126)
ALT: 30 U/L (ref 17–63)
ANION GAP: 5 (ref 5–15)
AST: 28 U/L (ref 15–41)
BILIRUBIN TOTAL: 0.4 mg/dL (ref 0.3–1.2)
BUN: 9 mg/dL (ref 6–20)
CO2: 26 mmol/L (ref 22–32)
Calcium: 8.7 mg/dL — ABNORMAL LOW (ref 8.9–10.3)
Chloride: 107 mmol/L (ref 101–111)
Creatinine, Ser: 1.03 mg/dL (ref 0.61–1.24)
GFR calc Af Amer: >60 mL/min (ref >60)
GFR calc non Af Amer: >60 mL/min (ref >60)
GLUCOSE: 101 mg/dL — AB (ref 65–99)
POTASSIUM: 3.9 mmol/L (ref 3.5–5.1)
SODIUM: 138 mmol/L (ref 135–145)
Total Protein: 7.6 g/dL (ref 6.5–8.1)

## 2015-08-29 MED ORDER — SODIUM CHLORIDE 0.9 % IV SOLN
2400.0000 mg/m2 | INTRAVENOUS | Status: DC
  Administered 2015-08-29: 4900 mg via INTRAVENOUS
  Filled 2015-08-29: qty 98

## 2015-08-29 MED ORDER — OXALIPLATIN CHEMO INJECTION 100 MG/20ML
85.0000 mg/m2 | Freq: Once | INTRAVENOUS | Status: AC
  Administered 2015-08-29: 175 mg via INTRAVENOUS
  Filled 2015-08-29: qty 35

## 2015-08-29 MED ORDER — DEXTROSE 5 % IV SOLN
Freq: Once | INTRAVENOUS | Status: AC
  Administered 2015-08-29: 10:00:00 via INTRAVENOUS

## 2015-08-29 MED ORDER — SODIUM CHLORIDE 0.9 % IV SOLN
Freq: Once | INTRAVENOUS | Status: AC
  Administered 2015-08-29: 11:00:00 via INTRAVENOUS
  Filled 2015-08-29: qty 4

## 2015-08-29 MED ORDER — SODIUM CHLORIDE 0.9 % IJ SOLN
10.0000 mL | INTRAMUSCULAR | Status: DC | PRN
  Administered 2015-08-29: 10 mL
  Filled 2015-08-29: qty 10

## 2015-08-29 MED ORDER — FLUOROURACIL CHEMO INJECTION 2.5 GM/50ML
400.0000 mg/m2 | Freq: Once | INTRAVENOUS | Status: AC
  Administered 2015-08-29: 800 mg via INTRAVENOUS
  Filled 2015-08-29: qty 16

## 2015-08-29 MED ORDER — LEUCOVORIN CALCIUM INJECTION 350 MG
400.0000 mg/m2 | Freq: Once | INTRAVENOUS | Status: AC
  Administered 2015-08-29: 820 mg via INTRAVENOUS
  Filled 2015-08-29: qty 41

## 2015-08-29 NOTE — Patient Instructions (Signed)
[facility] Discharge Instructions for Patients Receiving Chemotherapy  Today you received the following chemotherapy agents:  Oxaliplatin, leucovorin, and 5FU  To help prevent nausea and vomiting after your treatment, we encourage you to take your nausea medication as prescribed.   If you develop nausea and vomiting, or diarrhea that is not controlled by your medication, call the clinic.  The clinic phone number is [phone]. Office hours are Monday-Friday 8:30am-5:00pm.  BELOW ARE SYMPTOMS THAT SHOULD BE REPORTED IMMEDIATELY:  *FEVER GREATER THAN 101.0 F  *CHILLS WITH OR WITHOUT FEVER  NAUSEA AND VOMITING THAT IS NOT CONTROLLED WITH YOUR NAUSEA MEDICATION  *UNUSUAL SHORTNESS OF BREATH  *UNUSUAL BRUISING OR BLEEDING  TENDERNESS IN MOUTH AND THROAT WITH OR WITHOUT PRESENCE OF ULCERS  *URINARY PROBLEMS  *BOWEL PROBLEMS  UNUSUAL RASH Items with * indicate a potential emergency and should be followed up as soon as possible. If you have an emergency after office hours please contact your primary care physician or go to the nearest emergency department.  Please call the clinic during office hours if you have any questions or concerns.   You may also contact the Patient Navigator at [phone] should you have any questions or need assistance in obtaining follow up care. _____________________________________________________________________ Have you asked about our STAR program?    STAR stands for Survivorship Training and Rehabilitation, and this is a nationally recognized cancer care program that focuses on survivorship and rehabilitation.  Cancer and cancer treatments may cause problems, such as, pain, making you feel tired and keeping you from doing the things that you need or want to do. Cancer rehabilitation can help. Our goal is to reduce these troubling effects and help you have the best quality of life possible.  You may receive a survey from a nurse  that asks questions about your current state of health.  Based on the survey results, all eligible patients will be referred to the STAR program for an evaluation so we can better serve you! A frequently asked questions sheet is available upon request.           

## 2015-08-29 NOTE — Patient Instructions (Signed)
[place] at [facility] Discharge Instructions  RECOMMENDATIONS MADE BY THE CONSULTANT AND ANY TEST RESULTS WILL BE SENT TO YOUR REFERRING PHYSICIAN.  Exam and discussion by Robynn Pane, PA-C Report fevers, chills, uncontrolled nausea, vomiting, diarrhea or other concerns.  Folllow-up as scheduled.  Thank you for choosing [place] at [facility] to provide your oncology and hematology care.  To afford each patient quality time with our provider, please arrive at least 15 minutes before your scheduled appointment time.    You need to re-schedule your appointment should you arrive 10 or more minutes late.  We strive to give you quality time with our providers, and arriving late affects you and other patients whose appointments are after yours.  Also, if you no show three or more times for appointments you may be dismissed from the clinic at the providers discretion.     Again, thank you for choosing [facility].  Our hope is that these requests will decrease the amount of time that you wait before being seen by our physicians.       _____________________________________________________________  Should you have questions after your visit to [facility], please contact our office at [phone] between the hours of 8:30 a.m. and 4:30 p.m.  Voicemails left after 4:30 p.m. will not be returned until the following business day.  For prescription refill requests, have your pharmacy contact our office.    \

## 2015-08-29 NOTE — Progress Notes (Signed)
1310:  Tolerated tx w/o adverse reaction.  VSS.  In no distress.  Discharged ambulatory.

## 2015-08-31 ENCOUNTER — Encounter (HOSPITAL_BASED_OUTPATIENT_CLINIC_OR_DEPARTMENT_OTHER): Payer: Commercial Managed Care - HMO

## 2015-08-31 ENCOUNTER — Encounter (HOSPITAL_COMMUNITY): Payer: Self-pay

## 2015-08-31 VITALS — BP 142/94 | HR 101 | Temp 98.2°F | Resp 16

## 2015-08-31 DIAGNOSIS — Z5189 Encounter for other specified aftercare: Secondary | ICD-10-CM

## 2015-08-31 DIAGNOSIS — C20 Malignant neoplasm of rectum: Secondary | ICD-10-CM

## 2015-08-31 MED ORDER — PEGFILGRASTIM 6 MG/0.6ML ~~LOC~~ PSKT
PREFILLED_SYRINGE | SUBCUTANEOUS | Status: AC
  Filled 2015-08-31: qty 0.6

## 2015-08-31 MED ORDER — PEGFILGRASTIM 6 MG/0.6ML ~~LOC~~ PSKT
6.0000 mg | PREFILLED_SYRINGE | Freq: Once | SUBCUTANEOUS | Status: AC
  Administered 2015-08-31: 6 mg via SUBCUTANEOUS

## 2015-08-31 MED ORDER — HEPARIN SOD (PORK) LOCK FLUSH 100 UNIT/ML IV SOLN
500.0000 [IU] | Freq: Once | INTRAVENOUS | Status: AC | PRN
  Administered 2015-08-31: 500 [IU]

## 2015-08-31 MED ORDER — SODIUM CHLORIDE 0.9 % IJ SOLN
10.0000 mL | INTRAMUSCULAR | Status: DC | PRN
  Administered 2015-08-31: 10 mL
  Filled 2015-08-31: qty 10

## 2015-08-31 NOTE — Progress Notes (Signed)
..  [name] Careypresents today for neulasta OBI placement per MD orders. OBI device filled per protocol and placed on left Upper Arm. Needle/catheter placement noted prior to patient leaving. Tolerated without incident and aware of injection to be delivered in  27 hours.  [name] presented for Portacath de-access and flush.  Proper placement of portacath confirmed by CXR.  Portacath located left chest wall accessed with  H 20 needle.  Good blood return present. Portacath flushed with 98ml NS and 500U/78ml Heparin and needle removed intact.  Procedure tolerated well and without incident.  Pump removed

## 2015-08-31 NOTE — Patient Instructions (Signed)
Port flushed and pump removed neulasta OBI placed on left arm Please call the clinic if you have any questions or concerns

## 2015-09-01 ENCOUNTER — Ambulatory Visit: Payer: Commercial Managed Care - HMO

## 2015-09-06 ENCOUNTER — Ambulatory Visit (HOSPITAL_COMMUNITY): Payer: Commercial Managed Care - HMO | Admitting: Oncology

## 2015-09-06 ENCOUNTER — Ambulatory Visit: Payer: Commercial Managed Care - HMO

## 2015-09-07 ENCOUNTER — Ambulatory Visit (HOSPITAL_COMMUNITY): Payer: Commercial Managed Care - HMO | Admitting: Oncology

## 2015-09-07 ENCOUNTER — Inpatient Hospital Stay (HOSPITAL_COMMUNITY): Payer: Commercial Managed Care - HMO

## 2015-09-09 ENCOUNTER — Ambulatory Visit: Payer: Commercial Managed Care - HMO

## 2015-09-09 ENCOUNTER — Encounter (HOSPITAL_COMMUNITY): Payer: Commercial Managed Care - HMO

## 2015-09-12 ENCOUNTER — Encounter (HOSPITAL_BASED_OUTPATIENT_CLINIC_OR_DEPARTMENT_OTHER): Payer: Commercial Managed Care - HMO

## 2015-09-12 ENCOUNTER — Encounter (HOSPITAL_COMMUNITY): Payer: Commercial Managed Care - HMO | Admitting: Hematology & Oncology

## 2015-09-12 VITALS — BP 137/86 | HR 92 | Temp 98.4°F | Resp 18 | Wt 209.0 lb

## 2015-09-12 DIAGNOSIS — Z5111 Encounter for antineoplastic chemotherapy: Secondary | ICD-10-CM

## 2015-09-12 DIAGNOSIS — C20 Malignant neoplasm of rectum: Secondary | ICD-10-CM | POA: Diagnosis not present

## 2015-09-12 LAB — COMPREHENSIVE METABOLIC PANEL
ALT: 33 U/L (ref 17–63)
ANION GAP: 8 (ref 5–15)
AST: 29 U/L (ref 15–41)
Albumin: 4.2 g/dL (ref 3.5–5.0)
Alkaline Phosphatase: 115 U/L (ref 38–126)
BUN: 9 mg/dL (ref 6–20)
CHLORIDE: 106 mmol/L (ref 101–111)
CO2: 25 mmol/L (ref 22–32)
CREATININE: 1.07 mg/dL (ref 0.61–1.24)
Calcium: 9.6 mg/dL (ref 8.9–10.3)
Glucose, Bld: 107 mg/dL — ABNORMAL HIGH (ref 65–99)
POTASSIUM: 3.5 mmol/L (ref 3.5–5.1)
SODIUM: 139 mmol/L (ref 135–145)
Total Bilirubin: 0.5 mg/dL (ref 0.3–1.2)
Total Protein: 7.8 g/dL (ref 6.5–8.1)

## 2015-09-12 LAB — CBC WITH DIFFERENTIAL/PLATELET
BASOS PCT: 0 %
Basophils Absolute: 0.1 10*3/uL (ref 0.0–0.1)
EOS ABS: 0.4 10*3/uL (ref 0.0–0.7)
EOS PCT: 2 %
HCT: 38.6 % — ABNORMAL LOW (ref 39.0–52.0)
Hemoglobin: 12.8 g/dL — ABNORMAL LOW (ref 13.0–17.0)
LYMPHS ABS: 2.5 10*3/uL (ref 0.7–4.0)
Lymphocytes Relative: 16 %
MCH: 29.4 pg (ref 26.0–34.0)
MCHC: 33.2 g/dL (ref 30.0–36.0)
MCV: 88.7 fL (ref 78.0–100.0)
MONOS PCT: 4 %
Monocytes Absolute: 0.6 10*3/uL (ref 0.1–1.0)
NEUTROS PCT: 78 %
Neutro Abs: 12.2 10*3/uL — ABNORMAL HIGH (ref 1.7–7.7)
PLATELETS: 91 10*3/uL — AB (ref 150–400)
RBC: 4.35 MIL/uL (ref 4.22–5.81)
RDW: 14.3 % (ref 11.5–15.5)
WBC MORPHOLOGY: INCREASED
WBC: 15.7 10*3/uL — ABNORMAL HIGH (ref 4.0–10.5)

## 2015-09-12 MED ORDER — DEXTROSE 5 % IV SOLN
85.0000 mg/m2 | Freq: Once | INTRAVENOUS | Status: AC
  Administered 2015-09-12: 175 mg via INTRAVENOUS
  Filled 2015-09-12: qty 35

## 2015-09-12 MED ORDER — FLUOROURACIL CHEMO INJECTION 5 GM/100ML
2400.0000 mg/m2 | INTRAVENOUS | Status: DC
  Administered 2015-09-12: 4900 mg via INTRAVENOUS
  Filled 2015-09-12: qty 98

## 2015-09-12 MED ORDER — FLUOROURACIL CHEMO INJECTION 2.5 GM/50ML
400.0000 mg/m2 | Freq: Once | INTRAVENOUS | Status: AC
  Administered 2015-09-12: 800 mg via INTRAVENOUS
  Filled 2015-09-12: qty 16

## 2015-09-12 MED ORDER — LEUCOVORIN CALCIUM INJECTION 350 MG
400.0000 mg/m2 | Freq: Once | INTRAMUSCULAR | Status: AC
  Administered 2015-09-12: 820 mg via INTRAVENOUS
  Filled 2015-09-12: qty 41

## 2015-09-12 MED ORDER — DEXTROSE 5 % IV SOLN
Freq: Once | INTRAVENOUS | Status: AC
  Administered 2015-09-12: 12:00:00 via INTRAVENOUS

## 2015-09-12 MED ORDER — DEXAMETHASONE SODIUM PHOSPHATE 100 MG/10ML IJ SOLN
Freq: Once | INTRAMUSCULAR | Status: AC
  Administered 2015-09-12: 12:00:00 via INTRAVENOUS
  Filled 2015-09-12: qty 4

## 2015-09-12 MED ORDER — SODIUM CHLORIDE 0.9 % IJ SOLN
10.0000 mL | INTRAMUSCULAR | Status: DC | PRN
  Administered 2015-09-12: 10 mL
  Filled 2015-09-12: qty 10

## 2015-09-12 MED ORDER — HYDROCODONE-ACETAMINOPHEN 7.5-325 MG PO TABS: 1.0000 | ORAL_TABLET | Freq: Two times a day (BID) | ORAL | Status: DC | PRN

## 2015-09-12 MED ORDER — TRAMADOL HCL 50 MG PO TABS: ORAL_TABLET | ORAL | Status: DC

## 2015-09-12 NOTE — Patient Instructions (Signed)
[facility] Center Discharge Instructions for Patients Receiving Chemotherapy  Today you received the following chemotherapy agents:  Oxaliplatin, leucovorin, and 5FU  To help prevent nausea and vomiting after your treatment, we encourage you to take your nausea medication as prescribed.   If you develop nausea and vomiting, or diarrhea that is not controlled by your medication, call the clinic.  The clinic phone number is [phone]. Office hours are Monday-Friday 8:30am-5:00pm.  BELOW ARE SYMPTOMS THAT SHOULD BE REPORTED IMMEDIATELY:  *FEVER GREATER THAN 101.0 F  *CHILLS WITH OR WITHOUT FEVER  NAUSEA AND VOMITING THAT IS NOT CONTROLLED WITH YOUR NAUSEA MEDICATION  *UNUSUAL SHORTNESS OF BREATH  *UNUSUAL BRUISING OR BLEEDING  TENDERNESS IN MOUTH AND THROAT WITH OR WITHOUT PRESENCE OF ULCERS  *URINARY PROBLEMS  *BOWEL PROBLEMS  UNUSUAL RASH Items with * indicate a potential emergency and should be followed up as soon as possible. If you have an emergency after office hours please contact your primary care physician or go to the nearest emergency department.  Please call the clinic during office hours if you have any questions or concerns.   You may also contact the Patient Navigator at [phone] should you have any questions or need assistance in obtaining follow up care. _____________________________________________________________________ Have you asked about our STAR program?    STAR stands for Survivorship Training and Rehabilitation, and this is a nationally recognized cancer care program that focuses on survivorship and rehabilitation.  Cancer and cancer treatments may cause problems, such as, pain, making you feel tired and keeping you from doing the things that you need or want to do. Cancer rehabilitation can help. Our goal is to reduce these troubling effects and help you have the best quality of life possible.  You may receive a survey from a nurse  that asks questions about your current state of health.  Based on the survey results, all eligible patients will be referred to the STAR program for an evaluation so we can better serve you! A frequently asked questions sheet is available upon request.           

## 2015-09-12 NOTE — Progress Notes (Signed)
Tolerated tx w/o adverse reaction.  VSS.  A&Ox4; in no distress.  Discharged ambulatory.

## 2015-09-14 ENCOUNTER — Ambulatory Visit: Payer: Commercial Managed Care - HMO

## 2015-09-14 ENCOUNTER — Encounter (HOSPITAL_BASED_OUTPATIENT_CLINIC_OR_DEPARTMENT_OTHER): Payer: Commercial Managed Care - HMO

## 2015-09-14 VITALS — BP 123/76 | HR 102 | Temp 97.7°F | Resp 20

## 2015-09-14 DIAGNOSIS — Z452 Encounter for adjustment and management of vascular access device: Secondary | ICD-10-CM

## 2015-09-14 DIAGNOSIS — C20 Malignant neoplasm of rectum: Secondary | ICD-10-CM

## 2015-09-14 MED ORDER — SODIUM CHLORIDE 0.9 % IJ SOLN
10.0000 mL | INTRAMUSCULAR | Status: DC | PRN
  Administered 2015-09-14: 10 mL
  Filled 2015-09-14: qty 10

## 2015-09-14 MED ORDER — HEPARIN SOD (PORK) LOCK FLUSH 100 UNIT/ML IV SOLN
500.0000 [IU] | Freq: Once | INTRAVENOUS | Status: AC | PRN
  Administered 2015-09-14: 500 [IU]

## 2015-09-14 NOTE — Patient Instructions (Signed)
[facility] at [facility] Discharge Instructions  RECOMMENDATIONS MADE BY THE CONSULTANT AND ANY TEST RESULTS WILL BE SENT TO YOUR REFERRING PHYSICIAN.  D/C continuous infusion pump. Return as scheduled.  Thank you for choosing  [facility] at [facility] to provide your oncology and hematology care.  To afford each patient quality time with our provider, please arrive at least 15 minutes before your scheduled appointment time.    You need to re-schedule your appointment should you arrive 10 or more minutes late.  We strive to give you quality time with our providers, and arriving late affects you and other patients whose appointments are after yours.  Also, if you no show three or more times for appointments you may be dismissed from the clinic at the providers discretion.     Again, thank you for choosing  Cancer Center.  Our hope is that these requests will decrease the amount of time that you wait before being seen by our physicians.       _____________________________________________________________  Should you have questions after your visit to  Cancer Center, please contact our office at [phone] between the hours of 8:30 a.m. and 4:30 p.m.  Voicemails left after 4:30 p.m. will not be returned until the following business day.  For prescription refill requests, have your pharmacy contact our office.    

## 2015-09-14 NOTE — Progress Notes (Signed)
D/C continuous infusion pump. Flushed port per protocol, de-accessed port needle. Patient denies any complaints post chemo.

## 2015-09-20 ENCOUNTER — Other Ambulatory Visit: Payer: Self-pay

## 2015-09-20 DIAGNOSIS — C20 Malignant neoplasm of rectum: Secondary | ICD-10-CM

## 2015-09-20 NOTE — Patient Outreach (Signed)
[facility]) Care Management  09/20/2015  [name] 1961-05-24 [id number]   Initial Assessment Note  Outreach call #1 to patient for Initial Assessment.  [phone] home # - per automation: not accepting calls.  [phone] cell # - patient is driving requested call back around 4pm.   Plan:  RN CM will attempt call back later today per patient request.    Mariann Laster, RN, BSN, [facility], [place] Management Care Management Coordinator [phone] Direct [phone] Cell [phone] Office [phone] Fax

## 2015-09-20 NOTE — Patient Outreach (Signed)
[facility]) Care Management  09/20/2015  [name] [birth year] [id number]   Initial Assessment   Referral Date: 08/17/15 Referral Source: [place] Referral Issue: Cancer [employer] Medicare HMO [id number]  Primary MD: Dr. Rosita Fire Oncologist:  Patrici Ranks, MD  Social:  Call completed with patient.  Lives in his home; divorced and has 1 child. Patient states he lives with other family members: 6 people living in the home.  Caregiver: Good friend/ [name].  Falls: None Transportation: Owns a [employer] and still driving.  DME: Cane, CPap (2006), glucometer/Accu-chek (07/2015), eye glasses  Financial:  Patient c/o cost of specialty co-pays $45.00 and accumulation of hospital bills.  Resource:  -Physicist, medical -[facility] (2006) due to back and left leg issues.   -Medicare "Extra Help" application filed on 50/0/37 with [facility] assistant.  Outcome for eligibility pending letter back from Medicare.   Advanced Directives: None.  Patient verbalized interest in completing during discussion with patient today.   HTN (2010) Patient is taking BP medication everyday.   Diabetes (2010) Patient is taking no medication to manage blood sugar. BS readings: Checks 1-2 times a day.  A1c: 6.3 on 06/30/15   Cancer:  Stage 3 Rectal Cancer Chemo: yes. States feeling ok.   Medications Patient taking less than 10 medications.  Most have no co-pay;  using [employer] mail order for most medications except medications like pain meds that he can only get a 30 day quantity.   Consent: Patient agreed to [facility] services.   Plan RN CM will continue to monitor for outcome of Medicare Extra Help application.   RN CM will continue to monitor outcome of Pharmacy Referral   H/o RN CM sent [place] Referral 08/19/2015. -patient paying for pain medication out of pocket due to only 30 day order and unable to get 90 day supply.  -please assess why pain  medication is not covered and if pharmacy issue with filling for coverage. -Patient needs advocate to assist with resolution.  H/o patient does not always have co-pay for MD/specialist appt and this may help free up money to cover MD visits.   SW Referral sent 09/20/2015 -assess need for and assist with Medicaid application. -Advanced Directives   Consent  RN CM advised in next follow-call within 30 days for Monthly Telephone Assessment  RN CM advised to please notify MD of any changes in condition prior to scheduled appt's.  RN CM provided contact name and #, 24-hour nurse line # [phone].  RN CM confirmed patient is aware of 911 services for urgent emergency needs..  RN CM sent primary MD Physician Involvement letter and Initial Assessment.    Mariann Laster, RN, BSN, [facility], CCM  [employer] Management Coordinator [phone] Direct [phone] Cell [phone] Office [phone] Fax

## 2015-09-21 ENCOUNTER — Inpatient Hospital Stay (HOSPITAL_COMMUNITY): Payer: Commercial Managed Care - HMO

## 2015-09-21 ENCOUNTER — Ambulatory Visit (HOSPITAL_COMMUNITY): Payer: Commercial Managed Care - HMO | Admitting: Hematology & Oncology

## 2015-09-21 NOTE — Patient Outreach (Signed)
[facility]) Care Management  09/21/2015  KAMIL MCHAFFIE October 18, 1961 [id number]   Request from Mariann Laster, RN to assign SW, assigned Theadore Nan, LCSW.  Thanks, [name]. [place], [place] Assistant Phone: [phone] Fax: [phone]

## 2015-09-22 ENCOUNTER — Other Ambulatory Visit: Payer: Self-pay | Admitting: Licensed Clinical Social Worker

## 2015-09-22 ENCOUNTER — Encounter: Payer: Self-pay | Admitting: Licensed Clinical Social Worker

## 2015-09-22 NOTE — Patient Outreach (Signed)
Assessment:  CSW received referral on Pollyann Samples. RN Mariann Laster has been providing telephonic nursing support for client.  CSW received referral for client related to client need to discuss Medicaid application process and Medicaid completion. CSW was also asked to communicate with client regarding advanced directives preparation for client.  CSW called client home phone number on 09/22/15.  CSW spoke via phone with client on 09/22/15.  CSW verified identity of client. CSW received verbal permission from client on 09/22/15 to speak with client about client medical needs. CSW and client spoke of client needs. Client said he had many current medical bills and was trying to determine how to manage medical bills.  CSW spoke with client about Medicaid application process.  CSW described Medicaid application process with client and informed client of items client would need to collect to use as part of a Medicaid application. CSW especially talked with Nikki about past due medical expenses for client.  CSW encouraged Emmanuelle to collect past due client medical bills for past two years to take with him as part of a Medicaid application. CSW reminded client that he could go to [place] in [place] and meet with Medicaid caseworker to complete Medicaid. application with caseworker.  CSW informed client of other needed items client would need to bring to [facility] application appointment.  CSW also talked with client about advanced directives on 09/22/15. Client said he would like to read more about advanced directives and advanced directives preparation.  CSW informed client that [facility] program had information booklet on advanced directives and that advanced directives booklet would be mailed to client home address. Client agreed to this plan.  CSW spoke with client about [employer] transport benefit, about [employer] benefit and about [employer] pharmacy benefit (mail order pharmacy support).  Client said he is familiar with pharmacy support with [facility] and currently recieves his medications via mail order through [facility].  CSW asked client about [facility] consent form mailed to client a few weeks ago. Client said he had recently completed [facility] consent and mailed it to [facility] office in [place], [place] last week.  CSW gave client [facility] CSW name and phone number [phone]).  CSW thanked client for phone call with CSW on 09/22/15.     Plan: CSW to call client in two weeks to assess client needs. Client to call RN Chrystal Hutchinson, as needed to discuss nursing needs of client. CSW and RN Crystal Hutchinson to collaborate in monitoring current client needs.   [name].Jassica Zazueta MSW, LCSW Licensed Clinical Social Worker [facility] Care Management [phone]

## 2015-09-22 NOTE — Patient Outreach (Signed)
[facility]) Care Management  09/22/2015  [name] 1961/02/09 [id number]   Notification received from [name], LCSW to send patient an advance directives packet. Packet sent 09/22/2015.  Michale Weikel L. Khallid Pasillas, [place] Care Management Assistant

## 2015-09-23 ENCOUNTER — Encounter (HOSPITAL_COMMUNITY): Payer: Commercial Managed Care - HMO

## 2015-09-25 NOTE — Progress Notes (Signed)
Douglas Fire, MD [address] [place] [address]  Rectal cancer [facility])  CURRENT THERAPY: Adjuvant FOLFOX  INTERVAL HISTORY: [name] 54 y.o. male returns for followup of Stage IIIA rectal cancer.    Rectal cancer ([place])   03/31/2015 Initial Biopsy Rectum, biopsy, rectal mass - INVASIVE ADENOCARCINOMA   06/02/2015 Tumor Marker Results for [name] (MRN [id number]) as of 08/28/2015 09:09  06/02/2015 16:00 CEA: 4.3    06/08/2015 Imaging CT abd/pelvis-Rectal primary, without evidence of metastatic disease or acute complication.   06/24/2015 Imaging CT chest- No specific features identified to suggest metastatic disease to the chest.   07/06/2015 Definitive Surgery Colon, segmental resection for tumor, rectosigmoid - INVASIVE ADENOCARCINOMA, SEE COMMENT. - TUMOR INVADES INTO SUBMUCOSA.   08/15/2015 -  Chemotherapy FOLFOX   09/26/2015 Treatment Plan Change Treatment held today.  5FU bolus D/C'd.  Neulasta OnPro added to each subsequent cycles of therapy.    I personally reviewed and went over laboratory results with the patient.  The results are noted within this dictation.  They will be updated today.  He continues to tolerate therapy.  He denies any issues with his bowels including loose stools.  He denies any PN symptoms of fingers and toes.  He denies any nausea or vomiting.  His appetite comes and goes, but weight, overall, is stable.  He denies any mouth sores.  Past Medical History  Diagnosis Date  . Hypertension   . High cholesterol   . Sleep apnea   . Rectal cancer ([place])   . Depression   . Arthritis   . Diabetes mellitus     "Borderline"  . GERD (gastroesophageal reflux disease)     No weakness  . Neuropathy ([place])     "back missed up"    has Taking medication for chronic disease; Encounter for screening colonoscopy; History of colonic polyps; Diverticulosis of colon without hemorrhage; and Rectal cancer ([place]) on his problem list.     has No  Known Allergies.  Current Outpatient Prescriptions on File Prior to Visit  Medication Sig Dispense Refill  . ACCU-CHEK SMARTVIEW test strip     . amLODipine (NORVASC) 5 MG tablet Take 5 mg by mouth every morning.     Marland Kitchen dextrose 5 % SOLN 1,000 mL with fluorouracil 5 GM/100ML SOLN Inject into the vein. To be given every 14 days x 12 cycles. To run for 46 hours. Has not started yet.    . gabapentin (NEURONTIN) 800 MG tablet Take 800 mg by mouth 3 (three) times daily.     Marland Kitchen HYDROcodone-acetaminophen (NORCO) 7.5-325 MG tablet Take 1 tablet by mouth every 12 (twelve) hours as needed for moderate pain. 45 tablet 0  . LEUCOVORIN CALCIUM IJ Inject as directed. To be given every 14 days x 12 cycles. Has not started yet.    . lidocaine-prilocaine (EMLA) cream Apply a quarter size amount to port site 1 hour prior to chemo. Do not rub in. Cover with plastic wrap. 30 g 3  . lisinopril (PRINIVIL,ZESTRIL) 40 MG tablet Take 40 mg by mouth every morning.     . NON FORMULARY PT HAS A C-PAP MACHINE    . nortriptyline (PAMELOR) 75 MG capsule Take 75 mg by mouth at bedtime.      . ondansetron (ZOFRAN) 8 MG tablet Take 1 tablet (8 mg total) by mouth 2 (two) times daily. Start the day after chemo for 2 days. Then take as needed for nausea  or vomiting. 30 tablet 1  . OXALIPLATIN IV Inject into the vein. To be given every 14 days x 12 cycles. Has not started yet.    . prochlorperazine (COMPAZINE) 10 MG tablet Take 1 tablet (10 mg total) by mouth every 6 (six) hours as needed (Nausea or vomiting). 30 tablet 1  . tiZANidine (ZANAFLEX) 2 MG tablet Take 2 mg by mouth every 8 (eight) hours as needed for muscle spasms.    . traMADol (ULTRAM) 50 MG tablet May take 1-2 tabs every 6 hours as needed for pain 120 tablet 0   No current facility-administered medications on file prior to visit.    Past Surgical History  Procedure Laterality Date  . Back surgery    . Cholecystectomy    . Colonoscopy with propofol N/A 03/31/2015     Procedure: COLONOSCOPY WITH PROPOFOL at cecum 0842; withdrawal time=3minutes;  Surgeon: Daneil Dolin, MD;  Location: [facility];  Service: Endoscopy;  Laterality: N/A;  . Polypectomy  03/31/2015    Procedure: POLYPECTOMY;  Surgeon: Daneil Dolin, MD;  Location: [facility];  Service: Endoscopy;;  . Esophageal biopsy  03/31/2015    Procedure: BIOPSY;  Surgeon: Daneil Dolin, MD;  Location: [facility];  Service: Endoscopy;;  . Flexible sigmoidoscopy N/A 07/05/2015    Procedure: FLEXIBLE SIGMOIDOSCOPY;  Surgeon: [name], MD;  Location: [facility];  Service: Endoscopy;  Laterality: N/A;  with tattoo  . Xi robotic assisted lower anterior resection N/A 07/06/2015    Procedure: XI ROBOTIC ASSISTED LOWER ANTERIOR RESECTION, rigid proctoscopy;  Surgeon: [name], MD;  Location: [facility];  Service: General;  Laterality: N/A;  . Portacath placement N/A 08/09/2015    Procedure: INSERTION PORT-A-CATH LEFT SUBCLAVIAN;  Surgeon: [name], MD;  Location: [place];  Service: General;  Laterality: N/A;    Denies any headaches, dizziness, double vision, fevers, chills, night sweats, nausea, vomiting, diarrhea, constipation, chest pain, heart palpitations, shortness of breath, blood in stool, black tarry stool, urinary pain, urinary burning, urinary frequency, hematuria.   PHYSICAL EXAMINATION  ECOG PERFORMANCE STATUS: 0 - Asymptomatic  Filed Vitals:   09/26/15 0852  BP: 136/89  Pulse: 87  Temp: 99.1 F (37.3 C)  Resp: 18    GENERAL:alert, no distress, well nourished, well developed, comfortable, cooperative, obese, smiling and unaccompanied SKIN: skin color, texture, turgor are normal, no rashes or significant lesions HEAD: Normocephalic, No masses, lesions, tenderness or abnormalities EYES: normal, PERRLA, EOMI, Conjunctiva are pink and non-injected EARS: External ears normal OROPHARYNX:no exudate, no erythema, lips, buccal mucosa, and tongue normal, and mucous membranes are moist  NECK: supple, no  adenopathy, thyroid normal size, non-tender, without nodularity, no stridor, non-tender, trachea midline LYMPH:  no palpable lymphadenopathy BREAST:not examined LUNGS: clear to auscultation  HEART: regular rate & rhythm ABDOMEN:abdomen soft, non-tender, obese and normal bowel sounds BACK: Back symmetric, no curvature., No CVA tenderness EXTREMITIES:less then 2 second capillary refill, no joint deformities, effusion, or inflammation, no skin discoloration, no clubbing, no cyanosis  NEURO: alert & oriented x 3 with fluent speech, no focal motor/sensory deficits, gait normal   LABORATORY DATA: CBC    Component Value Date/Time   WBC 2.2* 09/26/2015 0845   RBC 4.39 09/26/2015 0845   HGB 12.7* 09/26/2015 0845   HCT 38.7* 09/26/2015 0845   PLT 90* 09/26/2015 0845   MCV 88.2 09/26/2015 0845   MCH 28.9 09/26/2015 0845   MCHC 32.8 09/26/2015 0845   RDW 14.0 09/26/2015 0845   LYMPHSABS 0.8 09/26/2015 0845  MONOABS 0.4 09/26/2015 0845   EOSABS 0.2 09/26/2015 0845   BASOSABS 0.0 09/26/2015 0845      Chemistry      Component Value Date/Time   NA 139 09/26/2015 0845   K 4.0 09/26/2015 0845   CL 106 09/26/2015 0845   CO2 25 09/26/2015 0845   BUN 12 09/26/2015 0845   CREATININE 1.06 09/26/2015 0845      Component Value Date/Time   CALCIUM 9.4 09/26/2015 0845   ALKPHOS 83 09/26/2015 0845   AST 49* 09/26/2015 0845   ALT 45 09/26/2015 0845   BILITOT 1.0 09/26/2015 0845     Lab Results  Component Value Date   CEA 4.3 06/02/2015      PENDING LABS:   RADIOGRAPHIC STUDIES:  No results found.   PATHOLOGY:    ASSESSMENT AND PLAN:  Rectal cancer ([place]) Stage IIIA invasive adenocarcinoma of rectum with initial biopsy on 03/31/2015 followed by definitive resection revealing a T1N1a tumor on 07/06/2015.  Oncology history is updated.  Pre-chemo labs today: CBC diff, CMET  Tx today will be deferred x 1 week due to [place] [address].  Neutropenic precautions discussed with patient by  nursing.  Additionally, I have D/C'd 5 FU bolus and added Neulasta OnPro to each of his subsequent cycles.  Return in 3 weeks for pre-chemo labs, follow-up appointment, and next cycle of chemotherapy.    THERAPY PLAN:  Continue treatment as planned.  All questions were answered. The patient knows to call the clinic with any problems, questions or concerns. We can certainly see the patient much sooner if necessary.  Patient and plan discussed with Dr. Ancil Linsey and she is in agreement with the aforementioned.   This note is electronically signed by: Doy Mince 09/26/2015 9:52 AM

## 2015-09-25 NOTE — Assessment & Plan Note (Addendum)
Stage IIIA invasive adenocarcinoma of rectum with initial biopsy on 03/31/2015 followed by definitive resection revealing a T1N1a tumor on 07/06/2015.  Oncology history is updated.  Pre-chemo labs today: CBC diff, CMET  Tx today will be deferred x 1 week due to [place] [address].  Neutropenic precautions discussed with patient by nursing.  Additionally, I have D/C'd 5 FU bolus and added Neulasta OnPro to each of his subsequent cycles.  Return in 3 weeks for pre-chemo labs, follow-up appointment, and next cycle of chemotherapy.

## 2015-09-26 ENCOUNTER — Encounter (HOSPITAL_COMMUNITY): Payer: Commercial Managed Care - HMO | Attending: Hematology & Oncology

## 2015-09-26 ENCOUNTER — Encounter: Payer: Self-pay | Admitting: Licensed Clinical Social Worker

## 2015-09-26 ENCOUNTER — Encounter (HOSPITAL_BASED_OUTPATIENT_CLINIC_OR_DEPARTMENT_OTHER): Payer: Commercial Managed Care - HMO | Admitting: Oncology

## 2015-09-26 ENCOUNTER — Encounter (HOSPITAL_COMMUNITY): Payer: Self-pay | Admitting: Oncology

## 2015-09-26 VITALS — BP 136/89 | HR 87 | Temp 99.1°F | Resp 18 | Wt 207.0 lb

## 2015-09-26 DIAGNOSIS — C20 Malignant neoplasm of rectum: Secondary | ICD-10-CM | POA: Diagnosis not present

## 2015-09-26 LAB — COMPREHENSIVE METABOLIC PANEL
ALBUMIN: 4.1 g/dL (ref 3.5–5.0)
ALT: 45 U/L (ref 17–63)
AST: 49 U/L — AB (ref 15–41)
Alkaline Phosphatase: 83 U/L (ref 38–126)
Anion gap: 8 (ref 5–15)
BUN: 12 mg/dL (ref 6–20)
CHLORIDE: 106 mmol/L (ref 101–111)
CO2: 25 mmol/L (ref 22–32)
CREATININE: 1.06 mg/dL (ref 0.61–1.24)
Calcium: 9.4 mg/dL (ref 8.9–10.3)
GFR calc Af Amer: >60 mL/min (ref >60)
GFR calc non Af Amer: >60 mL/min (ref >60)
Glucose, Bld: 85 mg/dL (ref 65–99)
Potassium: 4 mmol/L (ref 3.5–5.1)
Sodium: 139 mmol/L (ref 135–145)
Total Bilirubin: 1 mg/dL (ref 0.3–1.2)
Total Protein: 7.7 g/dL (ref 6.5–8.1)

## 2015-09-26 LAB — CBC WITH DIFFERENTIAL/PLATELET
Basophils Absolute: 0 10*3/uL (ref 0.0–0.1)
Basophils Relative: 0 %
EOS PCT: 9 %
Eosinophils Absolute: 0.2 10*3/uL (ref 0.0–0.7)
HCT: 38.7 % — ABNORMAL LOW (ref 39.0–52.0)
HEMOGLOBIN: 12.7 g/dL — AB (ref 13.0–17.0)
LYMPHS ABS: 0.8 10*3/uL (ref 0.7–4.0)
LYMPHS PCT: 36 %
MCH: 28.9 pg (ref 26.0–34.0)
MCHC: 32.8 g/dL (ref 30.0–36.0)
MCV: 88.2 fL (ref 78.0–100.0)
MONOS PCT: 19 %
Monocytes Absolute: 0.4 10*3/uL (ref 0.1–1.0)
NEUTROS PCT: 35 %
Neutro Abs: 0.8 10*3/uL — ABNORMAL LOW (ref 1.7–7.7)
Platelets: 90 10*3/uL — ABNORMAL LOW (ref 150–400)
RBC: 4.39 MIL/uL (ref 4.22–5.81)
RDW: 14 % (ref 11.5–15.5)
WBC: 2.2 10*3/uL — AB (ref 4.0–10.5)

## 2015-09-26 MED ORDER — SODIUM CHLORIDE 0.9 % IJ SOLN
10.0000 mL | INTRAMUSCULAR | Status: DC | PRN
  Administered 2015-09-26: 10 mL via INTRAVENOUS
  Filled 2015-09-26: qty 10

## 2015-09-26 MED ORDER — HEPARIN SOD (PORK) LOCK FLUSH 100 UNIT/ML IV SOLN
500.0000 [IU] | Freq: Once | INTRAVENOUS | Status: AC
  Administered 2015-09-26: 500 [IU] via INTRAVENOUS

## 2015-09-26 MED ORDER — HEPARIN SOD (PORK) LOCK FLUSH 100 UNIT/ML IV SOLN
INTRAVENOUS | Status: AC
  Filled 2015-09-26: qty 5

## 2015-09-26 NOTE — Patient Instructions (Signed)
[place] [facility] Discharge Instructions  RECOMMENDATIONS MADE BY THE CONSULTANT AND ANY TEST RESULTS WILL BE SENT TO YOUR REFERRING PHYSICIAN.  Hold treatment today due to low white blood cell count. Return in one week for treatment.  Neutropenic precautions.  Thank you for choosing [place] at [facility] to provide your oncology and hematology care.  To afford each patient quality time with our provider, please arrive at least 15 minutes before your scheduled appointment time.    You need to re-schedule your appointment should you arrive 10 or more minutes late.  We strive to give you quality time with our providers, and arriving late affects you and other patients whose appointments are after yours.  Also, if you no show three or more times for appointments you may be dismissed from the clinic at the providers discretion.     Again, thank you for choosing [facility].  Our hope is that these requests will decrease the amount of time that you wait before being seen by our physicians.       _____________________________________________________________  Should you have questions after your visit to [facility], please contact our office at [phone] between the hours of 8:30 a.m. and 4:30 p.m.  Voicemails left after 4:30 p.m. will not be returned until the following business day.  For prescription refill requests, have your pharmacy contact our office.   Neutropenia Neutropenia is a condition that occurs when the level of a certain type of white blood cell (neutrophil) in your body becomes lower than normal. Neutrophils are made in the bone marrow and fight infections. These cells protect against bacteria and viruses. The fewer neutrophils you have, and the longer your body remains without them, the greater your risk of getting a severe infection becomes. CAUSES  The cause of neutropenia may be hard to  determine. However, it is usually due to 3 main problems:   Decreased production of neutrophils. This may be due to:  Certain medicines such as chemotherapy.  Genetic problems.  Cancer.  Radiation treatments.  Vitamin deficiency.  Some pesticides.  Increased destruction of neutrophils. This may be due to:  Overwhelming infections.  Hemolytic anemia. This is when the body destroys its own blood cells.  Chemotherapy.  Neutrophils moving to areas of the body where they cannot fight infections. This may be due to:  Dialysis procedures.  Conditions where the spleen becomes enlarged. Neutrophils are held in the spleen and are not available to the rest of the body.  Overwhelming infections. The neutrophils are held in the area of the infection and are not available to the rest of the body. SYMPTOMS  There are no specific symptoms of neutropenia. The lack of neutrophils can result in an infection, and an infection can cause various problems. DIAGNOSIS  Diagnosis is made by a blood test. A complete blood count is performed. The normal level of neutrophils in human blood differs with age and race. Infants have lower counts than older children and adults. African Americans have lower counts than Caucasians or Asians. The average adult level is 1500 cells/mm3 of blood. Neutrophil counts are interpreted as follows:  Greater than 1000 cells/mm3 gives normal protection against infection.  500 to 1000 cells/mm3 gives an increased risk for infection.  200 to 500 cells/mm3 is a greater risk for severe infection.  Lower than 200 cells/mm3 is a marked risk of infection. This may require hospitalization and treatment  with antibiotic medicines. TREATMENT  Treatment depends on the underlying cause, severity, and presence of infections or symptoms. It also depends on your health. Your caregiver will discuss the treatment plan with you. Mild cases are often easily treated and have a good outcome.  Preventative measures may also be started to limit your risk of infections. Treatment can include:  Taking antibiotics.  Stopping medicines that are known to cause neutropenia.  Correcting nutritional deficiencies by eating green vegetables to supply folic acid and taking vitamin B supplements.  Stopping exposure to pesticides if your neutropenia is related to pesticide exposure.  Taking a blood growth factor called sargramostim, pegfilgrastim, or filgrastim if you are undergoing chemotherapy for cancer. This stimulates white blood cell production.  Removal of the spleen if you have Felty's syndrome and have repeated infections. HOME CARE INSTRUCTIONS   Follow your caregiver's instructions about when you need to have blood work done.  Wash your hands often. Make sure others who come in contact with you also wash their hands.  Wash raw fruits and vegetables before eating them. They can carry bacteria and fungi.  Avoid people with colds or spreadable (contagious) diseases (chickenpox, herpes zoster, influenza).  Avoid large crowds.  Avoid construction areas. The dust can release fungus into the air.  Be cautious around children in daycare or school environments.  Take care of your respiratory system by coughing and deep breathing.  Bathe daily.  Protect your skin from cuts and burns.  Do not work in the garden or with flowers and plants.  Care for the mouth before and after meals by brushing with a soft toothbrush. If you have mucositis, do not use mouthwash. Mouthwash contains alcohol and can dry out the mouth even more.  Clean the area between the genitals and the anus (perineal area) after urination and bowel movements. Women need to wipe from front to back.  Use a water soluble lubricant during sexual intercourse and practice good hygiene after. Do not have intercourse if you are severely neutropenic. Check with your caregiver for guidelines.  Exercise daily as  tolerated.  Avoid people who were vaccinated with a live vaccine in the past 30 days. You should not receive live vaccines (polio, typhoid).  Do not provide direct care for pets. Avoid animal droppings. Do not clean litter boxes and bird cages.  Do not share food utensils.  Do not use tampons, enemas, or rectal suppositories unless directed by your caregiver.  Use an electric razor to remove hair.  Wash your hands after handling magazines, letters, and newspapers. SEEK IMMEDIATE MEDICAL CARE IF:   You have a fever.  You have chills or start to shake.  You feel nauseous or vomit.  You develop mouth sores.  You develop aches and pains.  You have redness and swelling around open wounds.  Your skin is warm to the touch.  You have pus coming from your wounds.  You develop swollen lymph nodes.  You feel weak or fatigued.  You develop red streaks on the skin. MAKE SURE YOU:  Understand these instructions.  Will watch your condition.  Will get help right away if you are not doing well or get worse.   This information is not intended to replace advice given to you by your health care provider. Make sure you discuss any questions you have with your health care provider.   Document Released: 04/27/2002 Document Revised: 01/28/2012 Document Reviewed: 05/18/2015 Elsevier Interactive Patient Education 2016 [employer] Neutropenia Neutropenia is a  condition that occurs when the level of a certain type of white blood cell (neutrophil) in your body becomes lower than normal. Neutrophils are made in the bone marrow and fight infections. These cells protect against bacteria and viruses. The fewer neutrophils you have, and the longer your body remains without them, the greater your risk of getting a severe infection becomes. CAUSES  The cause of neutropenia may be hard to determine. However, it is usually due to 3 main problems:   Decreased production of neutrophils. This may be  due to:  Certain medicines such as chemotherapy.  Genetic problems.  Cancer.  Radiation treatments.  Vitamin deficiency.  Some pesticides.  Increased destruction of neutrophils. This may be due to:  Overwhelming infections.  Hemolytic anemia. This is when the body destroys its own blood cells.  Chemotherapy.  Neutrophils moving to areas of the body where they cannot fight infections. This may be due to:  Dialysis procedures.  Conditions where the spleen becomes enlarged. Neutrophils are held in the spleen and are not available to the rest of the body.  Overwhelming infections. The neutrophils are held in the area of the infection and are not available to the rest of the body. SYMPTOMS  There are no specific symptoms of neutropenia. The lack of neutrophils can result in an infection, and an infection can cause various problems. DIAGNOSIS  Diagnosis is made by a blood test. A complete blood count is performed. The normal level of neutrophils in human blood differs with age and race. Infants have lower counts than older children and adults. African Americans have lower counts than Caucasians or Asians. The average adult level is 1500 cells/mm3 of blood. Neutrophil counts are interpreted as follows:  Greater than 1000 cells/mm3 gives normal protection against infection.  500 to 1000 cells/mm3 gives an increased risk for infection.  200 to 500 cells/mm3 is a greater risk for severe infection.  Lower than 200 cells/mm3 is a marked risk of infection. This may require hospitalization and treatment with antibiotic medicines. TREATMENT  Treatment depends on the underlying cause, severity, and presence of infections or symptoms. It also depends on your health. Your caregiver will discuss the treatment plan with you. Mild cases are often easily treated and have a good outcome. Preventative measures may also be started to limit your risk of infections. Treatment can include:  Taking  antibiotics.  Stopping medicines that are known to cause neutropenia.  Correcting nutritional deficiencies by eating green vegetables to supply folic acid and taking vitamin B supplements.  Stopping exposure to pesticides if your neutropenia is related to pesticide exposure.  Taking a blood growth factor called sargramostim, pegfilgrastim, or filgrastim if you are undergoing chemotherapy for cancer. This stimulates white blood cell production.  Removal of the spleen if you have Felty's syndrome and have repeated infections. HOME CARE INSTRUCTIONS   Follow your caregiver's instructions about when you need to have blood work done.  Wash your hands often. Make sure others who come in contact with you also wash their hands.  Wash raw fruits and vegetables before eating them. They can carry bacteria and fungi.  Avoid people with colds or spreadable (contagious) diseases (chickenpox, herpes zoster, influenza).  Avoid large crowds.  Avoid construction areas. The dust can release fungus into the air.  Be cautious around children in daycare or school environments.  Take care of your respiratory system by coughing and deep breathing.  Bathe daily.  Protect your skin from cuts and burns.  Do  not work in the garden or with flowers and plants.  Care for the mouth before and after meals by brushing with a soft toothbrush. If you have mucositis, do not use mouthwash. Mouthwash contains alcohol and can dry out the mouth even more.  Clean the area between the genitals and the anus (perineal area) after urination and bowel movements. Women need to wipe from front to back.  Use a water soluble lubricant during sexual intercourse and practice good hygiene after. Do not have intercourse if you are severely neutropenic. Check with your caregiver for guidelines.  Exercise daily as tolerated.  Avoid people who were vaccinated with a live vaccine in the past 30 days. You should not receive live  vaccines (polio, typhoid).  Do not provide direct care for pets. Avoid animal droppings. Do not clean litter boxes and bird cages.  Do not share food utensils.  Do not use tampons, enemas, or rectal suppositories unless directed by your caregiver.  Use an electric razor to remove hair.  Wash your hands after handling magazines, letters, and newspapers. SEEK IMMEDIATE MEDICAL CARE IF:   You have a fever.  You have chills or start to shake.  You feel nauseous or vomit.  You develop mouth sores.  You develop aches and pains.  You have redness and swelling around open wounds.  Your skin is warm to the touch.  You have pus coming from your wounds.  You develop swollen lymph nodes.  You feel weak or fatigued.  You develop red streaks on the skin. MAKE SURE YOU:  Understand these instructions.  Will watch your condition.  Will get help right away if you are not doing well or get worse.   This information is not intended to replace advice given to you by your health care provider. Make sure you discuss any questions you have with your health care provider.   Document Released: 04/27/2002 Document Revised: 01/28/2012 Document Reviewed: 05/18/2015 Elsevier Interactive Patient Education [employer].

## 2015-09-26 NOTE — Progress Notes (Signed)
Hold tx today per Dr. Whitney Muse due to [facility] of 800.

## 2015-09-26 NOTE — Patient Instructions (Addendum)
[place] at [facility] Discharge Instructions  RECOMMENDATIONS MADE BY THE CONSULTANT AND ANY TEST RESULTS WILL BE SENT TO YOUR REFERRING PHYSICIAN.  Exam and discussion by Robynn Pane, PA-C Will defer treatment for 1 week as your blood counts are too low. Avoid crowd, people who are sick or other concerns. Report fevers, uncontrolled nausea, vomiting, diarrhea or other concerns.  Follow-up: See schedule.    Thank you for choosing [place] at [facility] to provide your oncology and hematology care.  To afford each patient quality time with our provider, please arrive at least 15 minutes before your scheduled appointment time.    You need to re-schedule your appointment should you arrive 10 or more minutes late.  We strive to give you quality time with our providers, and arriving late affects you and other patients whose appointments are after yours.  Also, if you no show three or more times for appointments you may be dismissed from the clinic at the providers discretion.     Again, thank you for choosing [facility].  Our hope is that these requests will decrease the amount of time that you wait before being seen by our physicians.       _____________________________________________________________  Should you have questions after your visit to [facility], please contact our office at [phone] between the hours of 8:30 a.m. and 4:30 p.m.  Voicemails left after 4:30 p.m. will not be returned until the following business day.  For prescription refill requests, have your pharmacy contact our office.

## 2015-09-28 ENCOUNTER — Encounter (HOSPITAL_COMMUNITY): Payer: Commercial Managed Care - HMO

## 2015-10-02 NOTE — Assessment & Plan Note (Addendum)
Stage IIIA invasive adenocarcinoma of rectum with initial biopsy on 03/31/2015 followed by definitive resection revealing a T1N1a tumor on 07/06/2015.  Oncology history is updated.  Pre-chemo labs today: CBC diff, CMET.  Labs meet treatment parameters today.  ANC is 1100.  Platelet count is WNL.  Will D/C 5FU bolus and add Neulasta ONPRO to day 3 of each cycle.  Rx's provided for Tramadol and Hydrocodone.  We will continue to refill until Nov 20, 2015 as this is managed by Dr. Merlene Laughter.  Patient has an outstanding balance at Dr. [name] office and patient will pay that down in time for him to be re-established.    Return in 2 weeks for pre-chemo labs, follow-up appointment, and next cycle of chemotherapy.

## 2015-10-02 NOTE — Progress Notes (Signed)
[facility], MD [place] [address]  Rectal cancer [facility]) - Plan: traMADol (ULTRAM) 50 MG tablet, HYDROcodone-acetaminophen (NORCO) 7.5-325 MG tablet  CURRENT THERAPY: Adjuvant FOLFOX  INTERVAL HISTORY: [name] 54 y.o. male returns for followup of Stage IIIA rectal cancer.    Rectal cancer ([place])   03/31/2015 Initial Biopsy Rectum, biopsy, rectal mass - INVASIVE ADENOCARCINOMA   06/02/2015 Tumor Marker Results for [name], [name] (MRN [id number]) as of 08/28/2015 09:09  06/02/2015 16:00 CEA: 4.3    06/08/2015 Imaging CT abd/pelvis-Rectal primary, without evidence of metastatic disease or acute complication.   06/24/2015 Imaging CT chest- No specific features identified to suggest metastatic disease to the chest.   07/06/2015 Definitive Surgery Colon, segmental resection for tumor, rectosigmoid - INVASIVE ADENOCARCINOMA, SEE COMMENT. - TUMOR INVADES INTO SUBMUCOSA.   08/15/2015 -  Chemotherapy FOLFOX   09/26/2015 Treatment Plan Change Treatment held today.  5FU bolus D/C'd.  Neulasta OnPro added to each subsequent cycles of therapy.   10/04/2015 Treatment Plan Change Neulasta onpro added to all subsequent cycles of treatment.    I personally reviewed and went over laboratory results with the patient.  The results are noted within this dictation.  They will be updated today.  Updated results meet treatment parameters.  Patient is educated on treatment plan changes moving forward in an attempt to eliminate treatment delays.  He understands these changes.  He admits to cold intolerance following treatment.  He denies any PN or changes with bowel movements.  He denies any mouth sores.  Past Medical History  Diagnosis Date  . Hypertension   . High cholesterol   . Sleep apnea   . Rectal cancer ([place])   . Depression   . Arthritis   . Diabetes mellitus     "Borderline"  . GERD (gastroesophageal reflux disease)     No weakness  . Neuropathy ([place])    "back missed up"    has Taking medication for chronic disease; Encounter for screening colonoscopy; History of colonic polyps; Diverticulosis of colon without hemorrhage; and Rectal cancer ([place]) on his problem list.     has No Known Allergies.  Current Outpatient Prescriptions on File Prior to Visit  Medication Sig Dispense Refill  . ACCU-CHEK SMARTVIEW test strip     . amLODipine (NORVASC) 5 MG tablet Take 5 mg by mouth every morning.     Marland Kitchen dextrose 5 % SOLN 1,000 mL with fluorouracil 5 GM/100ML SOLN Inject into the vein. To be given every 14 days x 12 cycles. To run for 46 hours. Has not started yet.    . gabapentin (NEURONTIN) 800 MG tablet Take 800 mg by mouth 3 (three) times daily.     Marland Kitchen LEUCOVORIN CALCIUM IJ Inject as directed. To be given every 14 days x 12 cycles. Has not started yet.    . lidocaine-prilocaine (EMLA) cream Apply a quarter size amount to port site 1 hour prior to chemo. Do not rub in. Cover with plastic wrap. 30 g 3  . lisinopril (PRINIVIL,ZESTRIL) 40 MG tablet Take 40 mg by mouth every morning.     . NON FORMULARY PT HAS A C-PAP MACHINE    . nortriptyline (PAMELOR) 75 MG capsule Take 75 mg by mouth at bedtime.      . ondansetron (ZOFRAN) 8 MG tablet Take 1 tablet (8 mg total) by mouth 2 (two) times daily. Start the day after chemo for 2 days. Then take as needed  for nausea or vomiting. 30 tablet 1  . OXALIPLATIN IV Inject into the vein. To be given every 14 days x 12 cycles. Has not started yet.    . prochlorperazine (COMPAZINE) 10 MG tablet Take 1 tablet (10 mg total) by mouth every 6 (six) hours as needed (Nausea or vomiting). 30 tablet 1  . tiZANidine (ZANAFLEX) 2 MG tablet Take 2 mg by mouth every 8 (eight) hours as needed for muscle spasms.     No current facility-administered medications on file prior to visit.    Past Surgical History  Procedure Laterality Date  . Back surgery    . Cholecystectomy    . Colonoscopy with propofol N/A 03/31/2015     Procedure: COLONOSCOPY WITH PROPOFOL at cecum 0842; withdrawal time=93minutes;  Surgeon: Daneil Dolin, MD;  Location: [facility];  Service: Endoscopy;  Laterality: N/A;  . Polypectomy  03/31/2015    Procedure: POLYPECTOMY;  Surgeon: Daneil Dolin, MD;  Location: [facility];  Service: Endoscopy;;  . Esophageal biopsy  03/31/2015    Procedure: BIOPSY;  Surgeon: Daneil Dolin, MD;  Location: [facility];  Service: Endoscopy;;  . Flexible sigmoidoscopy N/A 07/05/2015    Procedure: FLEXIBLE SIGMOIDOSCOPY;  Surgeon: [name], MD;  Location: [facility];  Service: Endoscopy;  Laterality: N/A;  with tattoo  . Xi robotic assisted lower anterior resection N/A 07/06/2015    Procedure: XI ROBOTIC ASSISTED LOWER ANTERIOR RESECTION, rigid proctoscopy;  Surgeon: [name], MD;  Location: [facility];  Service: General;  Laterality: N/A;  . Portacath placement N/A 08/09/2015    Procedure: INSERTION PORT-A-CATH LEFT SUBCLAVIAN;  Surgeon: [name], MD;  Location: [place];  Service: General;  Laterality: N/A;    Denies any headaches, dizziness, double vision, fevers, chills, night sweats, nausea, vomiting, diarrhea, constipation, chest pain, heart palpitations, shortness of breath, blood in stool, black tarry stool, urinary pain, urinary burning, urinary frequency, hematuria.   PHYSICAL EXAMINATION  ECOG PERFORMANCE STATUS: 0 - Asymptomatic  There were no vitals filed for this visit.  GENERAL:alert, no distress, well nourished, well developed, comfortable, cooperative, obese, smiling and unaccompanied SKIN: skin color, texture, turgor are normal, no rashes or significant lesions HEAD: Normocephalic, No masses, lesions, tenderness or abnormalities EYES: normal, PERRLA, EOMI, Conjunctiva are pink and non-injected EARS: External ears normal OROPHARYNX:no exudate, no erythema, lips, buccal mucosa, and tongue normal, and mucous membranes are moist  NECK: supple, no adenopathy, thyroid normal size, non-tender, without  nodularity, no stridor, non-tender, trachea midline LYMPH:  no palpable lymphadenopathy BREAST:not examined LUNGS: clear to auscultation  HEART: regular rate & rhythm ABDOMEN:abdomen soft, non-tender, obese and normal bowel sounds BACK: Back symmetric, no curvature., No CVA tenderness EXTREMITIES:less then 2 second capillary refill, no joint deformities, effusion, or inflammation, no skin discoloration, no clubbing, no cyanosis  NEURO: alert & oriented x 3 with fluent speech, no focal motor/sensory deficits, gait normal   LABORATORY DATA: CBC    Component Value Date/Time   WBC 3.9* 10/04/2015 0937   RBC 4.28 10/04/2015 0937   HGB 12.6* 10/04/2015 0937   HCT 38.3* 10/04/2015 0937   PLT 152 10/04/2015 0937   MCV 89.5 10/04/2015 0937   MCH 29.4 10/04/2015 0937   MCHC 32.9 10/04/2015 0937   RDW 14.6 10/04/2015 0937   LYMPHSABS 1.6 10/04/2015 0937   MONOABS 0.9 10/04/2015 0937   EOSABS 0.4 10/04/2015 0937   BASOSABS 0.0 10/04/2015 0937      Chemistry      Component Value Date/Time  NA 138 10/04/2015 0937   K 3.8 10/04/2015 0937   CL 105 10/04/2015 0937   CO2 26 10/04/2015 0937   BUN 13 10/04/2015 0937   CREATININE 1.10 10/04/2015 0937      Component Value Date/Time   CALCIUM 9.3 10/04/2015 0937   ALKPHOS 90 10/04/2015 0937   AST 39 10/04/2015 0937   ALT 37 10/04/2015 0937   BILITOT 0.4 10/04/2015 0937     Lab Results  Component Value Date   CEA 4.3 06/02/2015      PENDING LABS:   RADIOGRAPHIC STUDIES:  No results found.   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  Oncology history is updated.  Pre-chemo labs today: CBC diff, CMET.  Labs meet treatment parameters today.  ANC is 1100.  Platelet count is WNL.  Will D/C 5FU bolus and add Neulasta ONPRO to day 3 of each cycle.  Rx's provided  for Tramadol and Hydrocodone.  We will continue to refill until Nov 20, 2015 as this is managed by Dr. Merlene Laughter.  Patient has an outstanding balance at Dr. [name] office and patient will pay that down in time for him to be re-established.    Return in 2 weeks for pre-chemo labs, follow-up appointment, and next cycle of chemotherapy.    THERAPY PLAN:  Continue treatment as planned.  All questions were answered. The patient knows to call the clinic with any problems, questions or concerns. We can certainly see the patient much sooner if necessary.  Patient and plan discussed with Dr. Ancil Linsey and she is in agreement with the aforementioned.   This note is electronically signed by: Doy Mince 10/04/2015 10:41 AM

## 2015-10-04 ENCOUNTER — Ambulatory Visit (HOSPITAL_COMMUNITY): Payer: Commercial Managed Care - HMO | Admitting: Oncology

## 2015-10-04 ENCOUNTER — Encounter (HOSPITAL_COMMUNITY): Payer: Self-pay

## 2015-10-04 ENCOUNTER — Encounter (HOSPITAL_BASED_OUTPATIENT_CLINIC_OR_DEPARTMENT_OTHER): Payer: Commercial Managed Care - HMO

## 2015-10-04 ENCOUNTER — Encounter: Payer: Self-pay | Admitting: Licensed Clinical Social Worker

## 2015-10-04 ENCOUNTER — Other Ambulatory Visit: Payer: Self-pay | Admitting: Licensed Clinical Social Worker

## 2015-10-04 ENCOUNTER — Encounter (HOSPITAL_BASED_OUTPATIENT_CLINIC_OR_DEPARTMENT_OTHER): Payer: Commercial Managed Care - HMO | Admitting: Oncology

## 2015-10-04 VITALS — BP 107/26 | HR 84 | Temp 97.9°F | Resp 18 | Wt 209.0 lb

## 2015-10-04 DIAGNOSIS — Z5111 Encounter for antineoplastic chemotherapy: Secondary | ICD-10-CM

## 2015-10-04 DIAGNOSIS — C20 Malignant neoplasm of rectum: Secondary | ICD-10-CM | POA: Diagnosis not present

## 2015-10-04 LAB — COMPREHENSIVE METABOLIC PANEL
ALBUMIN: 4.2 g/dL (ref 3.5–5.0)
ALT: 37 U/L (ref 17–63)
ANION GAP: 7 (ref 5–15)
AST: 39 U/L (ref 15–41)
Alkaline Phosphatase: 90 U/L (ref 38–126)
BUN: 13 mg/dL (ref 6–20)
CO2: 26 mmol/L (ref 22–32)
Calcium: 9.3 mg/dL (ref 8.9–10.3)
Chloride: 105 mmol/L (ref 101–111)
Creatinine, Ser: 1.1 mg/dL (ref 0.61–1.24)
GFR calc Af Amer: >60 mL/min (ref >60)
GFR calc non Af Amer: >60 mL/min (ref >60)
Glucose, Bld: 86 mg/dL (ref 65–99)
POTASSIUM: 3.8 mmol/L (ref 3.5–5.1)
SODIUM: 138 mmol/L (ref 135–145)
Total Bilirubin: 0.4 mg/dL (ref 0.3–1.2)
Total Protein: 7.9 g/dL (ref 6.5–8.1)

## 2015-10-04 LAB — CBC WITH DIFFERENTIAL/PLATELET
BASOS ABS: 0 10*3/uL (ref 0.0–0.1)
BASOS PCT: 1 %
EOS ABS: 0.4 10*3/uL (ref 0.0–0.7)
Eosinophils Relative: 9 %
HCT: 38.3 % — ABNORMAL LOW (ref 39.0–52.0)
HEMOGLOBIN: 12.6 g/dL — AB (ref 13.0–17.0)
Lymphocytes Relative: 39 %
Lymphs Abs: 1.6 10*3/uL (ref 0.7–4.0)
MCH: 29.4 pg (ref 26.0–34.0)
MCHC: 32.9 g/dL (ref 30.0–36.0)
MCV: 89.5 fL (ref 78.0–100.0)
MONO ABS: 0.9 10*3/uL (ref 0.1–1.0)
MONOS PCT: 23 %
NEUTROS ABS: 1.1 10*3/uL — AB (ref 1.7–7.7)
NEUTROS PCT: 28 %
Platelets: 152 10*3/uL (ref 150–400)
RBC: 4.28 MIL/uL (ref 4.22–5.81)
RDW: 14.6 % (ref 11.5–15.5)
WBC: 3.9 10*3/uL — ABNORMAL LOW (ref 4.0–10.5)

## 2015-10-04 MED ORDER — HYDROCODONE-ACETAMINOPHEN 7.5-325 MG PO TABS: 1.0000 | ORAL_TABLET | Freq: Two times a day (BID) | ORAL | Status: DC | PRN

## 2015-10-04 MED ORDER — SODIUM CHLORIDE 0.9 % IV SOLN
2400.0000 mg/m2 | INTRAVENOUS | Status: DC
  Administered 2015-10-04: 4900 mg via INTRAVENOUS
  Filled 2015-10-04: qty 98

## 2015-10-04 MED ORDER — OXALIPLATIN CHEMO INJECTION 100 MG/20ML
85.0000 mg/m2 | Freq: Once | INTRAVENOUS | Status: AC
  Administered 2015-10-04: 175 mg via INTRAVENOUS
  Filled 2015-10-04: qty 35

## 2015-10-04 MED ORDER — LEUCOVORIN CALCIUM INJECTION 350 MG
400.0000 mg/m2 | Freq: Once | INTRAVENOUS | Status: AC
  Administered 2015-10-04: 820 mg via INTRAVENOUS
  Filled 2015-10-04: qty 41

## 2015-10-04 MED ORDER — TRAMADOL HCL 50 MG PO TABS: ORAL_TABLET | ORAL | Status: DC

## 2015-10-04 MED ORDER — SODIUM CHLORIDE 0.9 % IV SOLN
Freq: Once | INTRAVENOUS | Status: AC
  Administered 2015-10-04: 12:00:00 via INTRAVENOUS
  Filled 2015-10-04: qty 4

## 2015-10-04 MED ORDER — SODIUM CHLORIDE 0.9 % IJ SOLN
10.0000 mL | INTRAMUSCULAR | Status: DC | PRN
  Administered 2015-10-04: 10 mL
  Filled 2015-10-04: qty 10

## 2015-10-04 MED ORDER — DEXTROSE 5 % IV SOLN
Freq: Once | INTRAVENOUS | Status: AC
  Administered 2015-10-04: 12:00:00 via INTRAVENOUS

## 2015-10-04 NOTE — Patient Instructions (Addendum)
[place] at [facility] Discharge Instructions  RECOMMENDATIONS MADE BY THE CONSULTANT AND ANY TEST RESULTS WILL BE SENT TO YOUR REFERRING PHYSICIAN.  Exam and discussion by Robynn Pane, PA-C Will treat today with some modifications Report fevers, uncontrolled nausea, vomiting, or other concerns. Refills for Tramadol and Hydrocodone-acetaminophen - take as directed.  Follow-up in 2 weeks.  Thank you for choosing [place] at [facility] to provide your oncology and hematology care.  To afford each patient quality time with our provider, please arrive at least 15 minutes before your scheduled appointment time.    You need to re-schedule your appointment should you arrive 10 or more minutes late.  We strive to give you quality time with our providers, and arriving late affects you and other patients whose appointments are after yours.  Also, if you no show three or more times for appointments you may be dismissed from the clinic at the providers discretion.     Again, thank you for choosing [facility].  Our hope is that these requests will decrease the amount of time that you wait before being seen by our physicians.       _____________________________________________________________  Should you have questions after your visit to [facility], please contact our office at [phone] between the hours of 8:30 a.m. and 4:30 p.m.  Voicemails left after 4:30 p.m. will not be returned until the following business day.  For prescription refill requests, have your pharmacy contact our office.

## 2015-10-04 NOTE — Progress Notes (Signed)
ANC 1,100.  Okay to treat per Caroleen Hamman, PA-C after he discussed lab results via telephone with Dr. Whitney Muse.   1425:  Tolerated tx w/o adverse reaction. VSS.  A&Ox4, in no distress. Discharged ambulatory.

## 2015-10-04 NOTE — Patient Outreach (Signed)
Assessment:  CSW called client phone number on 10/04/15. CSW spoke via phone with client on 10/04/15. CSW verified client identity.  Client said he had received a chemotherapy treatment on 10/04/15.  CSW reminded client that [facility] office staff member was mailing advanced directive booklet to client for client to review. CSW encouraged client to review advanced directive booklet when it arrives at client home.  Also CSW spoke with client about Medicaid application and client's collecting needed financial information on client to help him apply for Medicaid.  CSW encouraged client to collect information on unpaid medical expenses for client for last two years. Client can then use this information as part of a Medicaid application for client (along with other needed financial information on client).  Client and CSW agreed for CSW to call client next week to speak further with client about Medicaid application and about advanced directives preparation for client.   Plan: Client to review/read advanced directives booklet mailed to client. Client to talk with CSW about advanced directives preparation for client. Client to collect needed financial information for client to help him complete Medicaid application. CSW to call client next week to assess needs of client.  [name].Rakesh Dutko MSW, LCSW Licensed Clinical Social Worker [facility] Care Management [phone]

## 2015-10-04 NOTE — Patient Instructions (Signed)
[facility] Discharge Instructions for Patients Receiving Chemotherapy  Today you received the following chemotherapy agents:  Oxaliplatin, leucovorin, and 79fu.  If you develop nausea and vomiting, or diarrhea that is not controlled by your medication, call the clinic.  The clinic phone number is [phone]. Office hours are Monday-Friday 8:30am-5:00pm.  BELOW ARE SYMPTOMS THAT SHOULD BE REPORTED IMMEDIATELY:  *FEVER GREATER THAN 101.0 F  *CHILLS WITH OR WITHOUT FEVER  NAUSEA AND VOMITING THAT IS NOT CONTROLLED WITH YOUR NAUSEA MEDICATION  *UNUSUAL SHORTNESS OF BREATH  *UNUSUAL BRUISING OR BLEEDING  TENDERNESS IN MOUTH AND THROAT WITH OR WITHOUT PRESENCE OF ULCERS  *URINARY PROBLEMS  *BOWEL PROBLEMS  UNUSUAL RASH Items with * indicate a potential emergency and should be followed up as soon as possible. If you have an emergency after office hours please contact your primary care physician or go to the nearest emergency department.  Please call the clinic during office hours if you have any questions or concerns.   You may also contact the Patient Navigator at [phone] should you have any questions or need assistance in obtaining follow up care.

## 2015-10-05 ENCOUNTER — Inpatient Hospital Stay (HOSPITAL_COMMUNITY): Payer: Commercial Managed Care - HMO

## 2015-10-05 ENCOUNTER — Ambulatory Visit (HOSPITAL_COMMUNITY): Payer: Commercial Managed Care - HMO | Admitting: Oncology

## 2015-10-06 ENCOUNTER — Other Ambulatory Visit: Payer: Self-pay | Admitting: Pharmacist

## 2015-10-06 ENCOUNTER — Encounter (HOSPITAL_COMMUNITY): Payer: Self-pay

## 2015-10-06 ENCOUNTER — Encounter (HOSPITAL_BASED_OUTPATIENT_CLINIC_OR_DEPARTMENT_OTHER): Payer: Commercial Managed Care - HMO

## 2015-10-06 VITALS — BP 133/89 | HR 98 | Temp 98.0°F | Resp 18

## 2015-10-06 DIAGNOSIS — Z5189 Encounter for other specified aftercare: Secondary | ICD-10-CM | POA: Diagnosis not present

## 2015-10-06 DIAGNOSIS — C20 Malignant neoplasm of rectum: Secondary | ICD-10-CM | POA: Diagnosis not present

## 2015-10-06 MED ORDER — PEGFILGRASTIM 6 MG/0.6ML ~~LOC~~ PSKT
PREFILLED_SYRINGE | SUBCUTANEOUS | Status: AC
  Filled 2015-10-06: qty 0.6

## 2015-10-06 MED ORDER — HEPARIN SOD (PORK) LOCK FLUSH 100 UNIT/ML IV SOLN: 500.0000 [IU] | Freq: Once | INTRAVENOUS | Status: DC | PRN

## 2015-10-06 MED ORDER — HEPARIN SOD (PORK) LOCK FLUSH 100 UNIT/ML IV SOLN
INTRAVENOUS | Status: AC
  Filled 2015-10-06: qty 5

## 2015-10-06 MED ORDER — PEGFILGRASTIM 6 MG/0.6ML ~~LOC~~ PSKT
6.0000 mg | PREFILLED_SYRINGE | Freq: Once | SUBCUTANEOUS | Status: AC
  Administered 2015-10-06: 6 mg via SUBCUTANEOUS

## 2015-10-06 MED ORDER — SODIUM CHLORIDE 0.9 % IJ SOLN: 10.0000 mL | INTRAMUSCULAR | Status: DC | PRN

## 2015-10-06 NOTE — Progress Notes (Signed)
1130:  Ekrem Cantarella presents to have home infusion pump d/c'd and port-a-cath deaccess/flush.  Portacath flushed with NS 10 ml and 500U/8ml Heparin, and needle removed intact.  Procedure tolerated well and without incident.  neulasta OBI placed per MD orders.  OBI device filled per protocol and placed on left Upper Arm.  Needle/catheter placement noted prior to patient leaving. Tolerated without incident and aware of injection to be delivered in 27 hours.

## 2015-10-06 NOTE — Patient Instructions (Signed)
[place] at [facility] Discharge Instructions  RECOMMENDATIONS MADE BY THE CONSULTANT AND ANY TEST RESULTS WILL BE SENT TO YOUR REFERRING PHYSICIAN.  Pump removal and port flush today. Neulasta OBI placement today.  Return as scheduled for treatment.   Thank you for choosing [place] at [facility] to provide your oncology and hematology care.  To afford each patient quality time with our provider, please arrive at least 15 minutes before your scheduled appointment time.    You need to re-schedule your appointment should you arrive 10 or more minutes late.  We strive to give you quality time with our providers, and arriving late affects you and other patients whose appointments are after yours.  Also, if you no show three or more times for appointments you may be dismissed from the clinic at the providers discretion.     Again, thank you for choosing [facility].  Our hope is that these requests will decrease the amount of time that you wait before being seen by our physicians.       _____________________________________________________________  Should you have questions after your visit to [facility], please contact our office at [phone] between the hours of 8:30 a.m. and 4:30 p.m.  Voicemails left after 4:30 p.m. will not be returned until the following business day.  For prescription refill requests, have your pharmacy contact our office.

## 2015-10-06 NOTE — Patient Outreach (Signed)
[place] [facility]) Care Management  [place]   10/06/2015  VINEET KINNEY Sep 22, 1961 [id number]  Subjective: [name] is a 54yo referred to [facility] for medication assistance.  I made outreach call to patient to follow up regarding Extra Help application.  Patient reports he received letter in the mail from social security stating he was eligible for Extra Help.  I confirmed on medicare.gov that patient is now receiving full extra help.  I explained to patient that this means his generic medications will be no more than $2.95 and his brand name medications will be no more than $7.40.  Patient was excited about this news and denies further pharmacy needs at this time.    Objective:   Current Medications: Current Outpatient Prescriptions  Medication Sig Dispense Refill  . ACCU-CHEK SMARTVIEW test strip     . amLODipine (NORVASC) 5 MG tablet Take 5 mg by mouth every morning.     Marland Kitchen dextrose 5 % SOLN 1,000 mL with fluorouracil 5 GM/100ML SOLN Inject into the vein. To be given every 14 days x 12 cycles. To run for 46 hours. Has not started yet.    . gabapentin (NEURONTIN) 800 MG tablet Take 800 mg by mouth 3 (three) times daily.     Marland Kitchen HYDROcodone-acetaminophen (NORCO) 7.5-325 MG tablet Take 1 tablet by mouth every 12 (twelve) hours as needed for moderate pain. 45 tablet 0  . LEUCOVORIN CALCIUM IJ Inject as directed. To be given every 14 days x 12 cycles. Has not started yet.    . lidocaine-prilocaine (EMLA) cream Apply a quarter size amount to port site 1 hour prior to chemo. Do not rub in. Cover with plastic wrap. 30 g 3  . lisinopril (PRINIVIL,ZESTRIL) 40 MG tablet Take 40 mg by mouth every morning.     . NON FORMULARY PT HAS A C-PAP MACHINE    . nortriptyline (PAMELOR) 75 MG capsule Take 75 mg by mouth at bedtime.      . OXALIPLATIN IV Inject into the vein. To be given every 14 days x 12 cycles. Has not started yet.    Marland Kitchen tiZANidine (ZANAFLEX) 2 MG tablet Take 2 mg  by mouth every 8 (eight) hours as needed for muscle spasms.    . traMADol (ULTRAM) 50 MG tablet May take 1-2 tabs every 6 hours as needed for pain 120 tablet 0  . ondansetron (ZOFRAN) 8 MG tablet Take 1 tablet (8 mg total) by mouth 2 (two) times daily. Start the day after chemo for 2 days. Then take as needed for nausea or vomiting. (Patient not taking: Reported on 10/06/2015) 30 tablet 1  . prochlorperazine (COMPAZINE) 10 MG tablet Take 1 tablet (10 mg total) by mouth every 6 (six) hours as needed (Nausea or vomiting). (Patient not taking: Reported on 10/06/2015) 30 tablet 1   No current facility-administered medications for this visit.   Facility-Administered Medications Ordered in Other Visits  Medication Dose Route Frequency Provider Last Rate Last Dose  . heparin lock flush 100 unit/mL  500 Units Intracatheter Once PRN Patrici Ranks, MD      . pegfilgrastim (NEULASTA ONPRO KIT) injection 6 mg  6 mg Subcutaneous Once Patrici Ranks, MD      . sodium chloride 0.9 % injection 10 mL  10 mL Intracatheter PRN Patrici Ranks, MD       Functional Status: In your present state of health, do you have any difficulty performing the following activities: 09/22/2015  09/20/2015  Hearing? N N  Vision? N N  Difficulty concentrating or making decisions? N N  Walking or climbing stairs? N N  Dressing or bathing? N N  Doing errands, shopping? N N  Preparing Food and eating ? N N  Using the Toilet? N N  In the past six months, have you accidently leaked urine? N N  Do you have problems with loss of bowel control? N N  Managing your Medications? N N  Managing your Finances? N N  Housekeeping or managing your Housekeeping? N N   Fall/Depression Screening: PHQ 2/9 Scores 09/22/2015 09/20/2015  PHQ - 2 Score 0 0    Assessment: 1.  Medication assistance:  Patient has qualified for Full Extra Help which will reduce prescription copays.  Patient denies any further pharmacy needs at this time.    Plan: 1.  Will close pharmacy program as no further intervention needed.  Will alert Damita Rhodie that patient was approved for Extra Help.  Will alert [place] [facility] [place] social worker Theadore Nan of pharmacy program closure.      Elisabeth Most, Pharm.D. Pharmacy Resident [place] [phone]

## 2015-10-07 ENCOUNTER — Encounter (HOSPITAL_COMMUNITY): Payer: Commercial Managed Care - HMO

## 2015-10-10 ENCOUNTER — Other Ambulatory Visit: Payer: Self-pay | Admitting: Licensed Clinical Social Worker

## 2015-10-10 ENCOUNTER — Ambulatory Visit (HOSPITAL_COMMUNITY): Payer: Commercial Managed Care - HMO | Admitting: Hematology & Oncology

## 2015-10-10 ENCOUNTER — Ambulatory Visit (HOSPITAL_COMMUNITY): Payer: Commercial Managed Care - HMO | Admitting: Oncology

## 2015-10-10 ENCOUNTER — Inpatient Hospital Stay (HOSPITAL_COMMUNITY): Payer: Commercial Managed Care - HMO

## 2015-10-10 NOTE — Patient Outreach (Signed)
Assessment:  CSW called client on 10/10/15 and spoke via phone with client. CSW verified identity of client.  CSW was informed by client that client did receive advanced directives packet from [facility] office. CSW talked with client about advanced directives on 10/10/15. CSW encouraged client to read/review advanced directives packet received from [facility] program by client and CSW would call client in two weeks to further discuss client questions regarding advanced directives preparation for client.  CSW also spoke with client about Medicaid application. CSW reviewed again with client items client could need to collect to take to a Medicaid application appointment at [facility] in [place], [place].  [place] discussed with client that client would need to collect unpaid medical bills for client for past two years and take these bills to [facility] application appointment with Medicaid caseworker.  Client said he understood information.  Client said he has chemotherapy treatment every other week.  He said he also has CSW phone number of [phone] to call CSW as needed to address social work needs of client.   Plan: Client to read/review advanced directives booklet mailed to client by [facility] program. Client to gather/collect needed financial documents to help client apply for Medicaid application for client at [facility] in [place], [place]. [place] to call client in two weeks to assess needs of client at that time.  [name].Thinh Cuccaro MSW, LCSW Licensed Clinical Social Worker [facility] Care Management [phone]

## 2015-10-12 ENCOUNTER — Encounter (HOSPITAL_COMMUNITY): Payer: Commercial Managed Care - HMO

## 2015-10-15 DIAGNOSIS — C20 Malignant neoplasm of rectum: Secondary | ICD-10-CM | POA: Diagnosis not present

## 2015-10-16 NOTE — Assessment & Plan Note (Addendum)
Stage IIIA invasive adenocarcinoma of rectum with initial biopsy on 03/31/2015 followed by definitive resection revealing a T1N1a tumor on 07/06/2015.  Oncology history is up-to-date.  Pre-chemo labs today: CBC diff, CMET.    We will continue to refill pain medications previously prescribed without any dose changes until Nov 20, 2015 as this is managed by Dr. Merlene Laughter.  Patient has an outstanding balance at Dr. [name] office and patient will pay that down in time for him to be re-established.    Platelet count is 84,000 today and therefore does not meet treatment parameters.  Treatment is delayed x 7 days.  Adjustment on his treatment plan is made accordingly with 5FU CI being reduced by 20%. 5FU bolus was previously deleted.  Patient educated on safe sex techniques and the need for barrier protection.  Return in 1 week for chemotherapy and 3 weeks for pre-chemo labs, follow-up appointment, and next cycle of chemotherapy.

## 2015-10-16 NOTE — Progress Notes (Signed)
Douglas Fire, MD [address] [place] [address]  Rectal cancer [facility]) - Plan: CBC with Differential, Comprehensive metabolic panel  CURRENT THERAPY: Adjuvant FOLFOX  INTERVAL HISTORY: [name] 54 y.o. male returns for followup of Stage IIIA rectal cancer.    Rectal cancer ([name])   03/31/2015 Initial Biopsy Rectum, biopsy, rectal mass - INVASIVE ADENOCARCINOMA   06/02/2015 Tumor Marker Results for TRIGGER, KOSKIE (MRN [id number]) as of 08/28/2015 09:09  06/02/2015 16:00 CEA: 4.3    06/08/2015 Imaging CT abd/pelvis-Rectal primary, without evidence of metastatic disease or acute complication.   06/24/2015 Imaging CT chest- No specific features identified to suggest metastatic disease to the chest.   07/06/2015 Definitive Surgery Colon, segmental resection for tumor, rectosigmoid - INVASIVE ADENOCARCINOMA, SEE COMMENT. - TUMOR INVADES INTO SUBMUCOSA.   08/15/2015 -  Chemotherapy FOLFOX   09/26/2015 Treatment Plan Change Treatment held today.  5FU bolus D/C'd.  Neulasta OnPro added to each subsequent cycles of therapy.   10/04/2015 Treatment Plan Change Neulasta onpro added to all subsequent cycles of treatment.   10/18/2015 Treatment Plan Change Tx held for thrombocytopenia.  5FU CI is reduced by 20% for subsequent treatments.    I personally reviewed and went over laboratory results with the patient.  The results are noted within this dictation.  They will be updated today.    He is late for his chemotherapy appointment today by 1 hour and 15 minutes and late for his office visit by about 5-10 minutes.  He was educated on the importance of timely arrival to the clinic by nursing as his tardiness affects our ability to treatment him in a timely fashion in addition to other patients in the treatment area. I re-iterated that as this is the second time he has been significantly late.  He asks about oral sex.  He is educated that barrier protection is needed (ie condom) for  anal, vaginal, and oral sex.  He was disappointed in this as he has a new girlfriend and they participate in oral sex regularly and the use of a condom decreases his sensation.  He is strongly advised that he is to use condom, no exceptions, while on chemotherapy and 4-6 weeks post chemotherapy.  Past Medical History  Diagnosis Date  . Hypertension   . High cholesterol   . Sleep apnea   . Rectal cancer ([name])   . Depression   . Arthritis   . Diabetes mellitus     "Borderline"  . GERD (gastroesophageal reflux disease)     No weakness  . Neuropathy ([place])     "back missed up"    has Taking medication for chronic disease; Encounter for screening colonoscopy; History of colonic polyps; Diverticulosis of colon without hemorrhage; and Rectal cancer ([name]) on his problem list.     has No Known Allergies.  Current Outpatient Prescriptions on File Prior to Visit  Medication Sig Dispense Refill  . ACCU-CHEK SMARTVIEW test strip     . dextrose 5 % SOLN 1,000 mL with fluorouracil 5 GM/100ML SOLN Inject into the vein. To be given every 14 days x 12 cycles. To run for 46 hours. Has not started yet.    . gabapentin (NEURONTIN) 800 MG tablet Take 800 mg by mouth 3 (three) times daily.     Marland Kitchen HYDROcodone-acetaminophen (NORCO) 7.5-325 MG tablet Take 1 tablet by mouth every 12 (twelve) hours as needed for moderate pain. 45 tablet 0  . LEUCOVORIN CALCIUM IJ Inject  as directed. To be given every 14 days x 12 cycles. Has not started yet.    . lidocaine-prilocaine (EMLA) cream Apply a quarter size amount to port site 1 hour prior to chemo. Do not rub in. Cover with plastic wrap. 30 g 3  . lisinopril (PRINIVIL,ZESTRIL) 40 MG tablet Take 40 mg by mouth every morning.     . NON FORMULARY PT HAS A C-PAP MACHINE    . nortriptyline (PAMELOR) 75 MG capsule Take 75 mg by mouth at bedtime.      . OXALIPLATIN IV Inject into the vein. To be given every 14 days x 12 cycles. Has not started yet.    Marland Kitchen tiZANidine (ZANAFLEX)  2 MG tablet Take 2 mg by mouth every 8 (eight) hours as needed for muscle spasms.    . traMADol (ULTRAM) 50 MG tablet May take 1-2 tabs every 6 hours as needed for pain 120 tablet 0  . amLODipine (NORVASC) 5 MG tablet Take 5 mg by mouth every morning.     . ondansetron (ZOFRAN) 8 MG tablet Take 1 tablet (8 mg total) by mouth 2 (two) times daily. Start the day after chemo for 2 days. Then take as needed for nausea or vomiting. (Patient not taking: Reported on 10/06/2015) 30 tablet 1  . prochlorperazine (COMPAZINE) 10 MG tablet Take 1 tablet (10 mg total) by mouth every 6 (six) hours as needed (Nausea or vomiting). (Patient not taking: Reported on 10/06/2015) 30 tablet 1   No current facility-administered medications on file prior to visit.    Past Surgical History  Procedure Laterality Date  . Back surgery    . Cholecystectomy    . Colonoscopy with propofol N/A 03/31/2015    Procedure: COLONOSCOPY WITH PROPOFOL at cecum 0842; withdrawal time=23minutes;  Surgeon: Daneil Dolin, MD;  Location: [facility];  Service: Endoscopy;  Laterality: N/A;  . Polypectomy  03/31/2015    Procedure: POLYPECTOMY;  Surgeon: Daneil Dolin, MD;  Location: [facility];  Service: Endoscopy;;  . Esophageal biopsy  03/31/2015    Procedure: BIOPSY;  Surgeon: Daneil Dolin, MD;  Location: [facility];  Service: Endoscopy;;  . Flexible sigmoidoscopy N/A 07/05/2015    Procedure: FLEXIBLE SIGMOIDOSCOPY;  Surgeon: [name], MD;  Location: [facility];  Service: Endoscopy;  Laterality: N/A;  with tattoo  . Xi robotic assisted lower anterior resection N/A 07/06/2015    Procedure: XI ROBOTIC ASSISTED LOWER ANTERIOR RESECTION, rigid proctoscopy;  Surgeon: [name], MD;  Location: [facility];  Service: General;  Laterality: N/A;  . Portacath placement N/A 08/09/2015    Procedure: INSERTION PORT-A-CATH LEFT SUBCLAVIAN;  Surgeon: [name], MD;  Location: [facility];  Service: General;  Laterality: N/A;    Denies any headaches, dizziness,  double vision, fevers, chills, night sweats, nausea, vomiting, diarrhea, constipation, chest pain, heart palpitations, shortness of breath, blood in stool, black tarry stool, urinary pain, urinary burning, urinary frequency, hematuria.   PHYSICAL EXAMINATION  ECOG PERFORMANCE STATUS: 0 - Asymptomatic  Filed Vitals:   10/18/15 1022  BP: 120/82  Pulse: 93  Temp: 98.5 F (36.9 C)  Resp: 16    GENERAL:alert, no distress, well nourished, well developed, comfortable, cooperative, obese, smiling and unaccompanied SKIN: skin color, texture, turgor are normal, no rashes or significant lesions HEAD: Normocephalic, No masses, lesions, tenderness or abnormalities EYES: normal, PERRLA, EOMI, Conjunctiva are pink and non-injected EARS: External ears normal OROPHARYNX:no exudate, no erythema, lips, buccal mucosa, and tongue normal, and mucous membranes are moist  NECK: supple, no adenopathy, thyroid normal size, non-tender, without nodularity, no stridor, non-tender, trachea midline LYMPH:  no palpable lymphadenopathy BREAST:not examined LUNGS: clear to auscultation  HEART: regular rate & rhythm ABDOMEN:abdomen soft, non-tender, obese and normal bowel sounds BACK: Back symmetric, no curvature., No CVA tenderness EXTREMITIES:less then 2 second capillary refill, no joint deformities, effusion, or inflammation, no skin discoloration, no clubbing, no cyanosis  NEURO: alert & oriented x 3 with fluent speech, no focal motor/sensory deficits, gait normal   LABORATORY DATA: CBC    Component Value Date/Time   WBC 12.3* 10/18/2015 1051   RBC 4.36 10/18/2015 1051   HGB 12.8* 10/18/2015 1051   HCT 38.5* 10/18/2015 1051   PLT 84* 10/18/2015 1051   MCV 88.3 10/18/2015 1051   MCH 29.4 10/18/2015 1051   MCHC 33.2 10/18/2015 1051   RDW 14.5 10/18/2015 1051   LYMPHSABS 2.1 10/18/2015 1051   MONOABS 0.7 10/18/2015 1051   EOSABS 0.5 10/18/2015 1051   BASOSABS 0.0 10/18/2015 1051      Chemistry        Component Value Date/Time   NA 138 10/18/2015 1051   K 3.9 10/18/2015 1051   CL 103 10/18/2015 1051   CO2 26 10/18/2015 1051   BUN 13 10/18/2015 1051   CREATININE 1.20 10/18/2015 1051      Component Value Date/Time   CALCIUM 9.5 10/18/2015 1051   ALKPHOS 106 10/18/2015 1051   AST 38 10/18/2015 1051   ALT 30 10/18/2015 1051   BILITOT 0.7 10/18/2015 1051     Lab Results  Component Value Date   CEA 4.3 06/02/2015      PENDING LABS:   RADIOGRAPHIC STUDIES:  No results found.   PATHOLOGY:    ASSESSMENT AND PLAN:  Rectal cancer ([place]) Stage IIIA invasive adenocarcinoma of rectum with initial biopsy on 03/31/2015 followed by definitive resection revealing a T1N1a tumor on 07/06/2015.  Oncology history is up-to-date.  Pre-chemo labs today: CBC diff, CMET.    We will continue to refill pain medications previously prescribed without any dose changes until Nov 20, 2015 as this is managed by Dr. Merlene Laughter.  Patient has an outstanding balance at Dr. [name] office and patient will pay that down in time for him to be re-established.    Platelet count is 84,000 today and therefore does not meet treatment parameters.  Treatment is delayed x 7 days.  Adjustment on his treatment plan is made accordingly with 5FU CI being reduced by 20%. 5FU bolus was previously deleted.  Patient educated on safe sex techniques and the need for barrier protection.  Return in 1 week for chemotherapy and 3 weeks for pre-chemo labs, follow-up appointment, and next cycle of chemotherapy.    THERAPY PLAN:  Defer treatment x 7 days for thrombocytopenia.  All questions were answered. The patient knows to call the clinic with any problems, questions or concerns. We can certainly see the patient much sooner if necessary.  Patient and plan discussed with Dr. Ancil Linsey and she is in agreement with the aforementioned.   This note is electronically signed by: Doy Mince 10/18/2015 7:12  PM

## 2015-10-17 NOTE — Patient Outreach (Signed)
[place] [facility]) Care Management  10/17/2015  [name] 01-30-1961 [id number]   Notification received from Elisabeth Most, PharmD stating patient had been approved for extra help through social security. I am happy to assist in any future needs that may arise, I am removing myself from the care team.  Thelmer Legler L. Rodney Yera, [place] Care Management Assistant

## 2015-10-17 NOTE — Progress Notes (Signed)
This encounter was created in error - please disregard.

## 2015-10-18 ENCOUNTER — Encounter (HOSPITAL_BASED_OUTPATIENT_CLINIC_OR_DEPARTMENT_OTHER): Payer: Commercial Managed Care - HMO | Admitting: Oncology

## 2015-10-18 ENCOUNTER — Encounter (HOSPITAL_COMMUNITY): Payer: Self-pay | Admitting: Oncology

## 2015-10-18 ENCOUNTER — Ambulatory Visit: Payer: Commercial Managed Care - HMO

## 2015-10-18 ENCOUNTER — Encounter (HOSPITAL_COMMUNITY): Payer: Commercial Managed Care - HMO

## 2015-10-18 VITALS — BP 120/82 | HR 93 | Temp 98.5°F | Resp 16 | Wt 202.1 lb

## 2015-10-18 DIAGNOSIS — C20 Malignant neoplasm of rectum: Secondary | ICD-10-CM | POA: Diagnosis not present

## 2015-10-18 LAB — COMPREHENSIVE METABOLIC PANEL
ALK PHOS: 106 U/L (ref 38–126)
ALT: 30 U/L (ref 17–63)
ANION GAP: 9 (ref 5–15)
AST: 38 U/L (ref 15–41)
Albumin: 4.4 g/dL (ref 3.5–5.0)
BILIRUBIN TOTAL: 0.7 mg/dL (ref 0.3–1.2)
BUN: 13 mg/dL (ref 6–20)
CALCIUM: 9.5 mg/dL (ref 8.9–10.3)
CO2: 26 mmol/L (ref 22–32)
Chloride: 103 mmol/L (ref 101–111)
Creatinine, Ser: 1.2 mg/dL (ref 0.61–1.24)
Glucose, Bld: 88 mg/dL (ref 65–99)
Potassium: 3.9 mmol/L (ref 3.5–5.1)
SODIUM: 138 mmol/L (ref 135–145)
TOTAL PROTEIN: 8.3 g/dL — AB (ref 6.5–8.1)

## 2015-10-18 LAB — CBC WITH DIFFERENTIAL/PLATELET
BASOS ABS: 0 10*3/uL (ref 0.0–0.1)
BASOS PCT: 0 %
Eosinophils Absolute: 0.5 10*3/uL (ref 0.0–0.7)
Eosinophils Relative: 4 %
HCT: 38.5 % — ABNORMAL LOW (ref 39.0–52.0)
HEMOGLOBIN: 12.8 g/dL — AB (ref 13.0–17.0)
Lymphocytes Relative: 17 %
Lymphs Abs: 2.1 10*3/uL (ref 0.7–4.0)
MCH: 29.4 pg (ref 26.0–34.0)
MCHC: 33.2 g/dL (ref 30.0–36.0)
MCV: 88.3 fL (ref 78.0–100.0)
MONO ABS: 0.7 10*3/uL (ref 0.1–1.0)
Monocytes Relative: 6 %
NEUTROS ABS: 8.9 10*3/uL — AB (ref 1.7–7.7)
Neutrophils Relative %: 73 %
Platelets: 84 10*3/uL — ABNORMAL LOW (ref 150–400)
RBC: 4.36 MIL/uL (ref 4.22–5.81)
RDW: 14.5 % (ref 11.5–15.5)
Smear Review: DECREASED
WBC: 12.3 10*3/uL — AB (ref 4.0–10.5)

## 2015-10-18 NOTE — Patient Instructions (Addendum)
[place] at [facility] Discharge Instructions  RECOMMENDATIONS MADE BY THE CONSULTANT AND ANY TEST RESULTS WILL BE SENT TO YOUR REFERRING PHYSICIAN.  Exam and discussion by Robynn Pane, PA-C You need to be on time for your appointments. Treatment will be deferred for 1 week because your platelet count is too low. If cold symptoms worsen let us know. You can take over the counter medication if needed for cold symptoms. Report fevers, uncontrolled nausea, vomiting, diarrhea, or other concerns.  Follow-up: Chemo in 1 week Office visit and chemotherapy in 3 weeks.  Thank you for choosing [place] at [facility] to provide your oncology and hematology care.  To afford each patient quality time with our provider, please arrive at least 15 minutes before your scheduled appointment time.    You need to re-schedule your appointment should you arrive 10 or more minutes late.  We strive to give you quality time with our providers, and arriving late affects you and other patients whose appointments are after yours.  Also, if you no show three or more times for appointments you may be dismissed from the clinic at the providers discretion.     Again, thank you for choosing [facility].  Our hope is that these requests will decrease the amount of time that you wait before being seen by our physicians.       _____________________________________________________________  Should you have questions after your visit to [facility], please contact our office at [phone] between the hours of 8:30 a.m. and 4:30 p.m.  Voicemails left after 4:30 p.m. will not be returned until the following business day.  For prescription refill requests, have your pharmacy contact our office.

## 2015-10-18 NOTE — Progress Notes (Signed)
Chemotherapy held this date per Robynn Pane, PA.

## 2015-10-19 ENCOUNTER — Inpatient Hospital Stay (HOSPITAL_COMMUNITY): Payer: Commercial Managed Care - HMO

## 2015-10-19 ENCOUNTER — Ambulatory Visit: Payer: Commercial Managed Care - HMO

## 2015-10-19 ENCOUNTER — Ambulatory Visit (HOSPITAL_COMMUNITY): Payer: Commercial Managed Care - HMO | Admitting: Hematology & Oncology

## 2015-10-20 ENCOUNTER — Encounter (HOSPITAL_COMMUNITY): Payer: Commercial Managed Care - HMO

## 2015-10-20 ENCOUNTER — Ambulatory Visit: Payer: Commercial Managed Care - HMO

## 2015-10-21 ENCOUNTER — Ambulatory Visit: Payer: Commercial Managed Care - HMO

## 2015-10-21 ENCOUNTER — Encounter (HOSPITAL_COMMUNITY): Payer: Commercial Managed Care - HMO

## 2015-10-21 ENCOUNTER — Other Ambulatory Visit: Payer: Self-pay | Admitting: Licensed Clinical Social Worker

## 2015-10-21 NOTE — Patient Outreach (Signed)
Assessment:  CSW called client on 10/21/15. CSW spoke via phone with client on 10/21/15. CSW verified identity of client.   Client and CSW spoke of financial needs of client.  CSW and client completed Financial Assessment Form for client on 10/21/15.  Client said his income each month is limited an thus he has stress related to determining which monthly bills to pay. He said he did receive a monthly food stamps benefit and this is helpful to client.  Client said he is attending scheduled medical appointments and is trying to save money each month to be able to pay his copay costs for his medical appointments. CSW encouraged client to collect unpaid medical expense bills he has received for past two years as well as other items previously discussed with client to help client be able to apply for Medicaid for client. Client said he has still not collected some of these documents needed to apply for Medicaid. CSW reminded client that a Medicaid application usually takes about 6 weeks to process.  Client said he was interested in gathering these needed documents to help him apply for Medicaid. CSW also offered to speak with client further about advanced directives preparation for client. Client recently received advanced directives booklet from [facility] program and is reading this advanced directives information booklet.  CSW encouraged client to call CSW at [phone] to discuss social work needs of client.  CSW thanked client for phone call with CSW on 10/21/15.  Plan: Client to continue to gather needed financial documents requested to help him be able to apply for Medicaid for client. Client to read/review advanced directives booklet and communicate, as needed, with CSW about advanced directives preparation for client. CSW to call client in three weeks.  [name].Shalaina Guardiola MSW, LCSW Licensed Clinical Social Worker [facility] Care Management [phone]

## 2015-10-24 ENCOUNTER — Ambulatory Visit: Payer: Commercial Managed Care - HMO

## 2015-10-24 ENCOUNTER — Ambulatory Visit (HOSPITAL_COMMUNITY): Payer: Commercial Managed Care - HMO | Admitting: Hematology & Oncology

## 2015-10-24 ENCOUNTER — Inpatient Hospital Stay (HOSPITAL_COMMUNITY): Payer: Commercial Managed Care - HMO

## 2015-10-25 ENCOUNTER — Encounter: Payer: Self-pay | Admitting: *Deleted

## 2015-10-25 ENCOUNTER — Ambulatory Visit: Payer: Commercial Managed Care - HMO

## 2015-10-25 ENCOUNTER — Encounter (HOSPITAL_COMMUNITY): Payer: Commercial Managed Care - HMO | Attending: Hematology & Oncology

## 2015-10-25 VITALS — BP 128/81 | HR 91 | Temp 97.4°F | Resp 16 | Wt 203.2 lb

## 2015-10-25 DIAGNOSIS — Z5111 Encounter for antineoplastic chemotherapy: Secondary | ICD-10-CM | POA: Diagnosis not present

## 2015-10-25 DIAGNOSIS — C20 Malignant neoplasm of rectum: Secondary | ICD-10-CM

## 2015-10-25 LAB — CBC WITH DIFFERENTIAL/PLATELET
BASOS ABS: 0 10*3/uL (ref 0.0–0.1)
Basophils Relative: 1 %
EOS PCT: 10 %
Eosinophils Absolute: 0.4 10*3/uL (ref 0.0–0.7)
HCT: 38.3 % — ABNORMAL LOW (ref 39.0–52.0)
Hemoglobin: 12.7 g/dL — ABNORMAL LOW (ref 13.0–17.0)
LYMPHS PCT: 35 %
Lymphs Abs: 1.4 10*3/uL (ref 0.7–4.0)
MCH: 29.3 pg (ref 26.0–34.0)
MCHC: 33.2 g/dL (ref 30.0–36.0)
MCV: 88.2 fL (ref 78.0–100.0)
MONO ABS: 0.5 10*3/uL (ref 0.1–1.0)
Monocytes Relative: 12 %
Neutro Abs: 1.7 10*3/uL (ref 1.7–7.7)
Neutrophils Relative %: 42 %
PLATELETS: 153 10*3/uL (ref 150–400)
RBC: 4.34 MIL/uL (ref 4.22–5.81)
RDW: 14.4 % (ref 11.5–15.5)
WBC: 4 10*3/uL (ref 4.0–10.5)

## 2015-10-25 LAB — COMPREHENSIVE METABOLIC PANEL
ALT: 38 U/L (ref 17–63)
AST: 34 U/L (ref 15–41)
Albumin: 4.4 g/dL (ref 3.5–5.0)
Alkaline Phosphatase: 90 U/L (ref 38–126)
Anion gap: 9 (ref 5–15)
BUN: 14 mg/dL (ref 6–20)
CHLORIDE: 103 mmol/L (ref 101–111)
CO2: 23 mmol/L (ref 22–32)
Calcium: 9.4 mg/dL (ref 8.9–10.3)
Creatinine, Ser: 1.01 mg/dL (ref 0.61–1.24)
Glucose, Bld: 85 mg/dL (ref 65–99)
POTASSIUM: 3.8 mmol/L (ref 3.5–5.1)
Sodium: 135 mmol/L (ref 135–145)
Total Bilirubin: 0.7 mg/dL (ref 0.3–1.2)
Total Protein: 8.1 g/dL (ref 6.5–8.1)

## 2015-10-25 MED ORDER — SODIUM CHLORIDE 0.9 % IJ SOLN
10.0000 mL | INTRAMUSCULAR | Status: DC | PRN
  Administered 2015-10-25: 10 mL
  Filled 2015-10-25: qty 10

## 2015-10-25 MED ORDER — LEUCOVORIN CALCIUM INJECTION 350 MG
400.0000 mg/m2 | Freq: Once | INTRAVENOUS | Status: AC
  Administered 2015-10-25: 820 mg via INTRAVENOUS
  Filled 2015-10-25: qty 41

## 2015-10-25 MED ORDER — DEXTROSE 5 % IV SOLN
Freq: Once | INTRAVENOUS | Status: AC
  Administered 2015-10-25: 11:00:00 via INTRAVENOUS

## 2015-10-25 MED ORDER — DEXAMETHASONE SODIUM PHOSPHATE 100 MG/10ML IJ SOLN
Freq: Once | INTRAMUSCULAR | Status: AC
  Administered 2015-10-25: 11:00:00 via INTRAVENOUS
  Filled 2015-10-25: qty 4

## 2015-10-25 MED ORDER — SODIUM CHLORIDE 0.9 % IV SOLN
1920.0000 mg/m2 | INTRAVENOUS | Status: DC
  Administered 2015-10-25: 3950 mg via INTRAVENOUS
  Filled 2015-10-25: qty 79

## 2015-10-25 MED ORDER — OXALIPLATIN CHEMO INJECTION 100 MG/20ML
85.0000 mg/m2 | Freq: Once | INTRAVENOUS | Status: AC
  Administered 2015-10-25: 175 mg via INTRAVENOUS
  Filled 2015-10-25: qty 35

## 2015-10-25 NOTE — Progress Notes (Signed)
Tolerated chemo well. Ambulatory on discharge home to self with continuous infusion pump intact. 

## 2015-10-25 NOTE — Progress Notes (Signed)
[place] Clinical Social Work  Clinical Social Work was referred by nurses for assessment of psychosocial needs due to difficulty of pt getting here on time. Clinical Social Worker met with patient to introduce self, explain role of CSW and to offer support and assess for needs. Pt reports he has difficulty getting here on time and it has been "kind of in an adjustment" since his cancer diagnosis and he is struggling getting here on time. CSW acknowledged common emotions experienced when facing cancer and coping techniques. He reports he drives from [place] and is "moving slow". CSW reviewed different options/resources to assist him with transportation. Pt was provided with ACS, [employer] medicare, [name] and [employer] numbers and how to access services.   Pt has wife, 57yo son, 35yo son and 35yo son. His wife and 25yo live at home. He reports his wife has "her own issues" and can't help much. Pt appears very overwhelmed with managing his care needs, which impacts getting to appointments on time. He expressed concerns about this and plans to follow up with other options for transportation. He really has issues getting to out of county doctor appts. CSW really encouraged pt to work on MCD application that [place] has suggested. Pt appears to meet income guidelines as he is currently receiving food stamps as well. He agrees to follow through on this plan. Pt is open to this CSW collaborating with [facility] CSW to further assist. Pt also plans to reach out to [name] for additional financial assistance and support programs. Pt will consider attending [facility] wellness program.   Clinical Social Work interventions: Supportive listening Resource education Support programs [place], [place] Tuesdays   [phone]

## 2015-10-25 NOTE — Patient Instructions (Signed)
[facility] Discharge Instructions for Patients Receiving Chemotherapy  Today you received the following chemotherapy agents Oxaliplatin, Leucovorin, and 5FU pump.  To help prevent nausea and vomiting after your treatment, we encourage you to take your nausea medication as instructed.  If you develop nausea and vomiting that is not controlled by your nausea medication, call the clinic. If it is after clinic hours your family physician or the after hours number for the clinic or go to the Emergency Department. BELOW ARE SYMPTOMS THAT SHOULD BE REPORTED IMMEDIATELY:  *FEVER GREATER THAN 101.0 F  *CHILLS WITH OR WITHOUT FEVER  NAUSEA AND VOMITING THAT IS NOT CONTROLLED WITH YOUR NAUSEA MEDICATION  *UNUSUAL SHORTNESS OF BREATH  *UNUSUAL BRUISING OR BLEEDING  TENDERNESS IN MOUTH AND THROAT WITH OR WITHOUT PRESENCE OF ULCERS  *URINARY PROBLEMS  *BOWEL PROBLEMS  UNUSUAL RASH Items with * indicate a potential emergency and should be followed up as soon as possible.  Return as scheduled Thursday for pump removal.  I have been informed and understand all the instructions given to me. I know to contact the clinic, my physician, or go to the Emergency Department if any problems should occur. I do not have any questions at this time, but understand that I may call the clinic during office hours or the Patient Navigator at [phone] should I have any questions or need assistance in obtaining follow up care.    __________________________________________  _____________  __________ Signature of Patient or Authorized Representative            Date                   Time    __________________________________________ Nurse's Signature

## 2015-10-26 ENCOUNTER — Ambulatory Visit: Payer: Commercial Managed Care - HMO

## 2015-10-26 ENCOUNTER — Encounter (HOSPITAL_COMMUNITY): Payer: Commercial Managed Care - HMO

## 2015-10-27 ENCOUNTER — Other Ambulatory Visit: Payer: Self-pay

## 2015-10-27 ENCOUNTER — Encounter (HOSPITAL_BASED_OUTPATIENT_CLINIC_OR_DEPARTMENT_OTHER): Payer: Commercial Managed Care - HMO

## 2015-10-27 ENCOUNTER — Encounter (HOSPITAL_COMMUNITY): Payer: Self-pay

## 2015-10-27 VITALS — BP 124/81 | HR 95 | Temp 98.0°F | Resp 18

## 2015-10-27 DIAGNOSIS — Z5189 Encounter for other specified aftercare: Secondary | ICD-10-CM

## 2015-10-27 DIAGNOSIS — C20 Malignant neoplasm of rectum: Secondary | ICD-10-CM | POA: Diagnosis not present

## 2015-10-27 MED ORDER — HEPARIN SOD (PORK) LOCK FLUSH 100 UNIT/ML IV SOLN
500.0000 [IU] | Freq: Once | INTRAVENOUS | Status: AC | PRN
  Administered 2015-10-27: 500 [IU]

## 2015-10-27 MED ORDER — SODIUM CHLORIDE 0.9 % IJ SOLN
10.0000 mL | INTRAMUSCULAR | Status: DC | PRN
  Administered 2015-10-27: 10 mL
  Filled 2015-10-27: qty 10

## 2015-10-27 MED ORDER — HEPARIN SOD (PORK) LOCK FLUSH 100 UNIT/ML IV SOLN
INTRAVENOUS | Status: AC
  Filled 2015-10-27: qty 5

## 2015-10-27 MED ORDER — PEGFILGRASTIM 6 MG/0.6ML ~~LOC~~ PSKT
6.0000 mg | PREFILLED_SYRINGE | Freq: Once | SUBCUTANEOUS | Status: AC
  Administered 2015-10-27: 6 mg via SUBCUTANEOUS

## 2015-10-27 MED ORDER — PEGFILGRASTIM 6 MG/0.6ML ~~LOC~~ PSKT
PREFILLED_SYRINGE | SUBCUTANEOUS | Status: AC
  Filled 2015-10-27: qty 0.6

## 2015-10-27 NOTE — Progress Notes (Signed)
..  [name] returns today for port de access and flush after 46 hr continous infusion of 30fu. Tolerated infusion without problems. Portacath located lt chest wall was  deaccessed and flushed with 51ml NS and 500U/13ml Heparin and needle removed intact.  Procedure without incident. Patient tolerated procedure well.  ....[name] arrived today for [facility] neulasta on body injector. See MAR for administration details. Injector in place and engaged with green light indicator on flashing. Tolerated application with out problems.

## 2015-10-27 NOTE — Patient Outreach (Signed)
[facility] [facility] Care Management  10/27/2015  [name] [birth year] [id number]   Telephone Monthly Assessment   Referral Date: 08/17/15 Referral Source: [place] Referral Issue: [name] Medicare HMO [id number]  Case Management Start Date:  08/19/15 Screening Date:  08/19/15 Initial Assessment Date:  09/20/2015 Program:  Other ([name]) 08/19/15  Providers: Primary MD: Dr. Rosita [name] Oncologist: Douglas Ranks, MD and Douglas Cancer, PA-C  Pain Management:  Dr. Merlene [name]  Social:  Call completed with patient. Patient driving and had limited time for call.  Call disconnected during call and RN CM unable to get patient back on the line.  RN CM left voice message in call back requesting call back when able to continue assessment.  Lives in his home; divorced and has 1 child. Patient states he lives with other family members: 6 people living in the home.  Caregiver: Good friend/ [name].  Falls: None Transportation: Owns a [employer] and still driving.  DME: Cane, CPap (2006), glucometer/Accu-chek (07/2015), eye glasses  Financial: Patient C/O cost of specialty co-pays $45.00 and accumulation of hospital bills.  Resource:  -Physicist, medical -[facility] (2006) due to back and left leg issues.  -Medicare "Extra Help"   Advanced Directives: None. Patient verbalized interest in completing on 09/20/2015 RN CM contact call.   HTN (2010) Patient is taking BP medication everyday.   Diabetes (2010) Patient is taking no medication to manage blood sugar. BS readings: Checks 1-2 times a day.  A1c: 6.3 on 06/30/15   [name]:  Stage 3 Rectal [name] Chemo: yes.  Home Infusion Pump and Portacath management   Medications Most have no co-pay; using [employer] mail order for most medications except medications like pain meds that he can only get a 30 day quantity. Approved for Medicare "Extra Help".     Pain Management  H/o Douglas Cancer, PA-C Per Epic MR note:  "will continue to refill pain medications previously prescribed without any dose changes until Nov 20, 2015 as this is managed by Dr. Merlene [name]. Patient has an outstanding balance at [name] office and patient will pay that down in time for him to be re-established."  [place] completed 10/06/15.   Consent: Patient agreed to [facility] services. Written consent on file.   Plan Pain Management  RN CM will continue to follow progress on patient's ability to schedule pain management appt due to financial barrier.   SW Referral sent 09/20/2015 and remains active. Medicaid application.  H/o patient has still not collected documents needed to apply for Medicaid.   Patient to continue to gather needed financial documents requested by [facility] SW to help him be able to apply for Medicaid. Advanced Directives -  [facility] SW addressed on SW contact:  patient to read/review advanced directives booklet and communicate, as needed, with CSW about advanced directives preparation. RN CM unable to conclude progress due to call disconnected.   RN CM will continue follow-up on next contact call.   Financial Assessment completed by [facility] SW 10/21/15.   Consent  RN CM advised in next follow-call within 30 days for Monthly Telephone Assessment  RN CM advised to please notify MD of any changes in condition prior to scheduled appt's.  RN CM provided contact name and #, 24-hour nurse line # [phone].  RN CM confirmed patient is aware of 911 services for urgent emergency needs..  RN CM sent primary MD Physician Involvement letter and Initial Assessment.   Mariann Laster, RN, BSN, MSHL, CCM  [facility] Care Management Coordinator [phone] Direct [phone] Cell [phone] Office [phone] Fax

## 2015-11-01 ENCOUNTER — Ambulatory Visit (HOSPITAL_COMMUNITY): Payer: Commercial Managed Care - HMO | Admitting: Hematology & Oncology

## 2015-11-01 ENCOUNTER — Inpatient Hospital Stay (HOSPITAL_COMMUNITY): Payer: Commercial Managed Care - HMO

## 2015-11-01 DIAGNOSIS — C20 Malignant neoplasm of rectum: Secondary | ICD-10-CM | POA: Diagnosis not present

## 2015-11-01 DIAGNOSIS — I10 Essential (primary) hypertension: Secondary | ICD-10-CM | POA: Diagnosis not present

## 2015-11-01 DIAGNOSIS — E785 Hyperlipidemia, unspecified: Secondary | ICD-10-CM | POA: Diagnosis not present

## 2015-11-01 DIAGNOSIS — R7309 Other abnormal glucose: Secondary | ICD-10-CM | POA: Diagnosis not present

## 2015-11-01 DIAGNOSIS — Z6836 Body mass index (BMI) 36.0-36.9, adult: Secondary | ICD-10-CM | POA: Diagnosis not present

## 2015-11-01 DIAGNOSIS — E119 Type 2 diabetes mellitus without complications: Secondary | ICD-10-CM | POA: Diagnosis not present

## 2015-11-02 DIAGNOSIS — H5210 Myopia, unspecified eye: Secondary | ICD-10-CM | POA: Diagnosis not present

## 2015-11-02 DIAGNOSIS — H524 Presbyopia: Secondary | ICD-10-CM | POA: Diagnosis not present

## 2015-11-03 ENCOUNTER — Encounter (HOSPITAL_COMMUNITY): Payer: Commercial Managed Care - HMO

## 2015-11-04 NOTE — Progress Notes (Signed)
[place] at [facility] Progress Note  Patient Care Team: Rosita Fire, MD as PCP - General (Internal Medicine) Daneil Dolin, MD as Consulting Physician (Gastroenterology) Standley Brooking, RN as [place], [place] as [place] (Licensed Clinical Social Worker)  CHIEF COMPLAINTS/PURPOSE OF CONSULTATION:  Stage IIIA Rectal carcinoma Robotic assisted LAR, rigid proctoscopy with Dr. [name] 9/83/3825 Lymph nodes: number examined 14; number positive: 1 Pathologic Staging: pT1, pN1a, pMX Normal preoperative CEA Adjuvant FOLFOX Plans for concurrent XRT/5-FU  HISTORY OF PRESENTING ILLNESS:  [name] 54 y.o. male is here for follow-up of his rectal cancer. He has undergone definitive surgical therapy and did remarkably well. Unfortunately he did have one lymph node that contained metastatic disease. CT imaging prior to surgery revealed no evidence of metastatic disease. He has started chemotherapy with FOLFOX, we plan on "sandwiching" in infusional 5-FU/XRT. Plan is for 6 months adjuvant therapy.  Pt sitting in chemotherapy recliner. Pt relaxed. Pt states that he is having diarrhea that comes and goes. Anti-diarrheal medication controls it. He states that 2 immodium stops his diarrhea completely. Pt notes poor energy at times and has to nap in the evening. But overall he feels he is doing well. No mouth sores, nausea, vomiting. 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    Pain is a 7.  States he needs a refill on his pain medication.  He used to use a pain clinic.  He has been unable to return because of debt issues with them. He is requesting a refill on hydrocodone. He hopes that once his chemotherapy is finished he can afford to return to his pain physician.   MEDICAL HISTORY:  Past Medical History  Diagnosis Date  . Hypertension   . High cholesterol   . Sleep  apnea   . Rectal cancer ([place])   . Depression   . Arthritis   . Diabetes mellitus     "Borderline"  . GERD (gastroesophageal reflux disease)     No weakness  . Neuropathy ([place])     "back missed up"    SURGICAL HISTORY: Past Surgical History  Procedure Laterality Date  . Back surgery    . Cholecystectomy    . Colonoscopy with propofol N/A 03/31/2015    Procedure: COLONOSCOPY WITH PROPOFOL at cecum 0842; withdrawal time=55mnutes;  Surgeon: RDaneil Dolin MD;  Location: [facility];  Service: Endoscopy;  Laterality: N/A;  . Polypectomy  03/31/2015    Procedure: POLYPECTOMY;  Surgeon: RDaneil Dolin MD;  Location: [facility];  Service: Endoscopy;;  . Esophageal biopsy  03/31/2015    Procedure: BIOPSY;  Surgeon: RDaneil Dolin MD;  Location: [facility];  Service: Endoscopy;;  . Flexible sigmoidoscopy N/A 07/05/2015    Procedure: FLEXIBLE SIGMOIDOSCOPY;  Surgeon: ALeighton Ruff MD;  Location: [facility];  Service: Endoscopy;  Laterality: N/A;  with tattoo  . Xi robotic assisted lower anterior resection N/A 07/06/2015    Procedure: XI ROBOTIC ASSISTED LOWER ANTERIOR RESECTION, rigid proctoscopy;  Surgeon: ALeighton Ruff MD;  Location: [facility];  Service: General;  Laterality: N/A;  . Portacath placement N/A 08/09/2015    Procedure: INSERTION PORT-A-CATH LEFT SUBCLAVIAN;  Surgeon: ALeighton Ruff MD;  [place]  Service: General;  Laterality: N/A;    SOCIAL HISTORY: Social History   Social History  . Marital Status: Legally Separated    Spouse Name: N/A  . Number of Children: N/A  .  Years of Education: N/A   Occupational History  . Not on file.   Social History Main Topics  . Smoking status: Former Smoker    Quit date: 03/23/2006  . Smokeless tobacco: Never Used  . Alcohol Use: No  . Drug Use: No  . Sexual Activity: Not Currently   Other Topics Concern  . 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 Colon cancer Neg Hx     "Not that I know of"  . Colon polyps Neg Hx     "not that I know of"  . Hypertension      "Not sure who, I don't know much about my family"  . Diabetes      "Not sure who, I don't know much about my family"   has no family status information on file.  Mother deceased, 47, liver cancer Father living, 76 3 brothers 1 sister  ALLERGIES:  has No Known Allergies.  MEDICATIONS:  Current Outpatient Prescriptions  Medication Sig Dispense Refill  . ACCU-CHEK SMARTVIEW test strip     . amLODipine (NORVASC) 5 MG tablet Take 5 mg by mouth every morning.     Marland Kitchen dextrose 5 % SOLN 1,000 mL with fluorouracil 5 GM/100ML SOLN Inject into the vein. To be given every 14 days x 12 cycles. To run for 46 hours. Has not started yet.    . gabapentin (NEURONTIN) 800 MG tablet Take 800 mg by mouth 3 (three) times daily.     Marland Kitchen HYDROcodone-acetaminophen (NORCO) 7.5-325 MG tablet Take 1 tablet by mouth every 12 (twelve) hours as needed for moderate pain. 45 tablet 0  . LEUCOVORIN CALCIUM IJ Inject as directed. To be given every 14 days x 12 cycles. Has not started yet.    . lidocaine-prilocaine (EMLA) cream Apply a quarter size amount to port site 1 hour prior to chemo. Do not rub in. Cover with plastic wrap. 30 g 3  . lisinopril (PRINIVIL,ZESTRIL) 40 MG tablet Take 40 mg by mouth every morning.     . NON FORMULARY PT HAS A C-PAP MACHINE    . nortriptyline (PAMELOR) 75 MG capsule Take 75 mg by mouth at bedtime.      . ondansetron (ZOFRAN) 8 MG tablet Take 1 tablet (8 mg total) by mouth 2 (two) times daily. Start the day after chemo for 2 days. Then take as needed for nausea or vomiting. (Patient not taking: Reported on 10/06/2015) 30 tablet 1  . OXALIPLATIN IV Inject into the vein. To be given every 14 days x 12 cycles. Has not started yet.    . prochlorperazine  (COMPAZINE) 10 MG tablet Take 1 tablet (10 mg total) by mouth every 6 (six) hours as needed (Nausea or vomiting). (Patient not taking: Reported on 10/06/2015) 30 tablet 1  . tiZANidine (ZANAFLEX) 2 MG tablet Take 2 mg by mouth every 8 (eight) hours as needed for muscle spasms.    . traMADol (ULTRAM) 50 MG tablet May take 1-2 tabs every 6 hours as needed for pain 120 tablet 0   No current facility-administered medications for this visit.    Review of Systems  Constitutional: Negative for fever, chills, weight loss and malaise/fatigue.  HENT: Negative for congestion, hearing loss, nosebleeds, sore throat and tinnitus.   Eyes: Negative  for blurred vision, double vision, pain and discharge.  Respiratory: Negative for cough, hemoptysis, sputum production, shortness of breath and wheezing.   Cardiovascular: Negative for chest pain, palpitations, claudication, leg swelling and PND.  Gastrointestinal: Negative for heartburn, nausea, vomiting, abdominal pain, diarrhea, constipation, blood in stool and melena.  Genitourinary: Negative for dysuria, urgency, frequency and hematuria.  Musculoskeletal: Negative for myalgias, joint pain and falls.  Skin: Negative for itching and rash.  Neurological: Negative for dizziness, tingling, tremors, sensory change, speech change, focal weakness, seizures, loss of consciousness, weakness and headaches.  Endo/Heme/Allergies: Does not bruise/bleed easily.  Psychiatric/Behavioral: Negative for depression, suicidal ideas, memory loss and substance abuse. The patient is not nervous/anxious and does not have insomnia.   All other systems reviewed and are negative.  14 point ROS was done and is otherwise as detailed above or in HPI   PHYSICAL EXAMINATION: ECOG PERFORMANCE STATUS: 0 - Asymptomatic  Filed Vitals:   11/08/15 0822  BP: 124/57  Pulse: 92  Temp: 97 F (36.1 C)  Resp: 18   Filed Weights   11/08/15 0822  Weight: 200 lb 6.4 oz (90.901 kg)     Physical Exam  Constitutional: He is oriented to person, place, and time and well-developed, well-nourished, and in no distress.  HENT:  Head: Normocephalic and atraumatic.  Nose: Nose normal.  Mouth/Throat: Oropharynx is clear and moist. No oropharyngeal exudate.  Eyes: Conjunctivae and EOM are normal. Pupils are equal, round, and reactive to light. Right eye exhibits no discharge. Left eye exhibits no discharge. No scleral icterus.  Neck: Normal range of motion. Neck supple. No tracheal deviation present. No thyromegaly present.  Cardiovascular: Normal rate, regular rhythm and normal heart sounds.  Exam reveals no gallop and no friction rub.   No murmur heard. Pulmonary/Chest: Effort normal and breath sounds normal. He has no wheezes. He has no rales.  Abdominal: Soft. Bowel sounds are normal. He exhibits no distension and no mass. There is no tenderness. There is no rebound and no guarding.  well-healing surgical incision sites  Genitourinary:  Musculoskeletal: Normal range of motion. He exhibits no edema.  Lymphadenopathy:    He has no cervical adenopathy.  Neurological: He is alert and oriented to person, place, and time. He has normal reflexes. No cranial nerve deficit. Gait normal. Coordination normal.  Skin: Skin is warm and dry. No rash noted.  Psychiatric: Mood, memory, affect and judgment normal.  Nursing note and vitals reviewed.   LABORATORY DATA:  I have reviewed the data as listed Results for GRANGER, CHUI (MRN [id number])   CBC    Component Value Date/Time   WBC 9.5 11/08/2015 0830   RBC 4.35 11/08/2015 0830   HGB 12.8* 11/08/2015 0830   HCT 38.8* 11/08/2015 0830   PLT 73* 11/08/2015 0830   MCV 89.2 11/08/2015 0830   MCH 29.4 11/08/2015 0830   MCHC 33.0 11/08/2015 0830   RDW 15.2 11/08/2015 0830   LYMPHSABS 1.9 11/08/2015 0830   MONOABS 0.5 11/08/2015 0830   EOSABS 0.4 11/08/2015 0830   BASOSABS 0.0 11/08/2015 0830    CMP     Component Value  Date/Time   NA 139 11/08/2015 0830   K 3.8 11/08/2015 0830   CL 105 11/08/2015 0830   CO2 26 11/08/2015 0830   GLUCOSE 88 11/08/2015 0830   BUN 11 11/08/2015 0830   CREATININE 1.04 11/08/2015 0830   CALCIUM 9.6 11/08/2015 0830   PROT 7.9 11/08/2015 0830   ALBUMIN 4.1 11/08/2015 0830  AST 34 11/08/2015 0830   ALT 32 11/08/2015 0830   ALKPHOS 124 11/08/2015 0830   BILITOT 0.5 11/08/2015 0830   GFRNONAA >60 11/08/2015 0830   GFRAA >60 11/08/2015 0830      PATHOLOGY:REPORT OF SURGICAL PATHOLOGY ADDITIONAL INFORMATION: 1. Mismatch Repair (MMR) Protein Immunohistochemistry (IHC) IHC Expression Result: MLH1: Preserved nuclear expression (greater 50% tumor expression) MSH2: Preserved nuclear expression (greater 50% tumor expression) MSH6: Preserved nuclear expression (greater 50% tumor expression) PMS2: Preserved nuclear expression (greater 50% tumor expression) * Internal control demonstrates intact nuclear expression Interpretation: NORMAL There is preserved expression of the major and minor MMR proteins. There is a very low probability that microsatellite instability (MSI) is present. However, certain clinically significant MMR protein mutations may result in preservation of nuclear expression. It is recommended that the preservation of protein expression be correlated with molecular based MSI testing. References: 1. Guidelines on Genetic Evaluation and Management of Lynch Syndrome: A Consensus Statement by the [place] Multi-Society Task Force on Colorectal Cancer Gae Dry. Sherlie Ban , MD, and others . Am Nicki [place] 2014; [phone]; doi: 10.1038/ajg.2014.186; published online 09 June 2013 2. Outcomes of screening endometrial cancer patients for Lynch syndrome by patient-administered checklist. Olena Heckle MS, and others. Gynecol Oncol 2013;131(3):619-623. Mali RUND DO Pathologist, Electronic Signature ( Signed 07/11/2015) FINAL DIAGNOSIS Diagnosis 1. Colon, segmental  resection for tumor, rectosigmoid - INVASIVE ADENOCARCINOMA, SEE COMMENT. - TUMOR INVADES INTO SUBMUCOSA. 1 of 4 FINAL for JOSEDE, CICERO A [phone]) Diagnosis(continued) - ONE LYMPH NODE, POSITIVE FOR METASTATIC TUMOR (1/14). - SURGICAL MARGINS, NEGATIVE FOR TUMOR. - SEE TUMOR SYNOPTIC TEMPLATE BELOW. 2. Colon, resection margin (donut), final distal margin - BENIGN COLON. - NEGATIVE FOR DYSPLASIA OR MALIGNANCY. Microscopic Comment 1. COLON AND RECTUM (INCLUDING TRANS-ANAL RESECTION): Specimen: Rectosigmoid Procedure: Resection Tumor site: Mid rectum Specimen integrity: Intact Macroscopic intactness of mesorectum: Near complete Macroscopic tumor perforation: Absent Invasive tumor: Maximum size: 2.0 cm Histologic type(s): Adenocarcinoma Histologic grade and differentiation: G2: moderately differentiated/low grade Type of polyp in which invasive carcinoma arose: Adenoma with high grade dysplasia Microscopic extension of invasive tumor: Tumor extends into submucosa and is immediately adjacent to muscularis propria. Lymph-Vascular invasion: Present Peri-neural invasion: Present Tumor deposit(s) (discontinuous extramural extension): Absent Resection margins: Proximal margin: Negative Distal margin: Negative Circumferential (radial) (posterior ascending, posterior descending; lateral and posterior mid-rectum; and entire lower 1/3 rectum): Negative Mesenteric margin (sigmoid and transverse): Negative Distance closest margin (if all above margins negative): 1.0 cm (distal) Treatment effect (neo-adjuvant therapy): None Additional polyp(s): None Non-neoplastic findings: None Lymph nodes: number examined 14; number positive: 1 Pathologic Staging: pT1, pN1a, pMX Ancillary studies: Per the [place] Gastrointestinal Oncology Working group Guidelines, tumor will be submitted for both microsatellite instability by PCR and mismatch repair protein expression by immunohistochemistry.  The results will be reported in an addendum. (CR:kh 07-08-15) Mali RUND DO Pathologist, Electronic Signature (Case signed 07/08/2015) Specimen Gross and Clinical Information 2 of 4 FINAL for LIBERTY, STEAD A [id number]) Specimen(s) Obtained: 1. Colon, segmental resection for tumor, rectosigmoid 2. Colon, resection margin (donut), final distal margin Specimen Clinical Information 1. rectal cancer (kp) Gross 1. Specimen: Rectosigmoid, to include at least middle third of rectum. Specimen integrity: Intact. Specimen length: 31 cm. Mesorectal intactness: Near complete. Tumor location: Middle third of rectum, posterior wall, 6.0 cm from the sigmorectal junction. Tumor size: 2.0 cm in diameter tan red indurated, sessile ill-defined mass. Percent of bowel circumference involved: Approximately 20%. Tumor distance to margins: Proximal: 28 cm. Distal: 1 cm. Mesenteric (sigmoid and transverse): 11  cm. Radial (posterior ascending, posterior descending; lateral and posterior mid-rectum; and entire lower 1/3 rectum): 0.5 cm to the mesorectal soft tissue margin. Macroscopic extent of tumor invasion: The tumor involves, but does not grossly completely transect the muscularis. Total presumed lymph nodes: 14 possible lymph nodes ranging from 0.1 to 0.8 cm. Extramural satellite tumor nodules: None. Mucosal polyp(s): None. Additional findings: None. Block summary: A = shave of proximal margin B,C = shave of entire distal margin D-H = tumor, entirely submitted I = four nodes, whole J = four nodes, whole K = four nodes, whole L = two nodes, whole Total: 12 blocks submitted 2. Received in formalin is a 1.1 cm in diameter and 1.3 cm thick portion of tissue with tan to hyperemic, smooth mucosa on one end. Sectioned and entirely submitted in one block. (SW:ds 07/07/15) Stain(s) used in Diagnosis: The following stain(s) were used in diagnosing the case: MSH6, MLH1, MSH2, PMS2. The control(s)  stained appropriately. Disclaimer Some of these immunohistochemical stains may have been developed and the performance characteristics determined by [facility]. Some may not have been cleared or approved by the U.S. Food and Drug Administration. The FDA has determined that such clearance or approval is not necessary. This test is used for clinical purposes. It should not be regarded as investigational or for research. This laboratory is certified under the [place] (CLIA-88) as qualified to perform high complexity clinical laboratory testing. Report signed out from the following location(s) Technical Component was performed at [facility]. [address],[place] [address].[id number]., 3 of 4 FINAL for DANTE, ROUDEBUSH A [phone]) Report signed out from the following location(s)(continued) Technical Component was performed at [facility], [place], [place] [address]. CLIA #: [id number], Technical component and interpretation was performed at [facility] [place], [place],  [address]. CLIA #: [id number], 4 of 4   ASSESSMENT & PLAN:  T1, N1, M0 adenocarcinoma of the rectum, Stage IIIA Robotic assisted LAR with Dr. [name] Normal preoperative CEA Adjuvant FOLFOX (will also receive infusion 5-FU/XRT)  Overall he has done well with chemotherapy with principal issues revolving around thrombocytopenia.  We have eliminated his 5-FU bolus and dose reduced his 5-FU. Unfortunately platelet count today is 73K, oxaliplatin will be reduced to 75 mg/m2 for his next cycle. He will be delayed one week.  I am referring him to Radiation Oncology to arrange for adjuvant XRT, during which time he will receive only infusional 5-FU.  In summary, Refill on hydrocodone today refill on 12/22  Defer x 1week, next Wednesday Cut the dose back on  oxalplatin Return to see the doctor in 3 weeks Referral to [facility] for radiation Please call the clinic if you have any questions or concerns   All questions were answered. The patient knows to call the clinic with any problems, questions or concerns.  This note was electronically signed.  K[place] Fam. Whitney Muse, MD

## 2015-11-08 ENCOUNTER — Other Ambulatory Visit (HOSPITAL_COMMUNITY): Payer: Self-pay | Admitting: Hematology & Oncology

## 2015-11-08 ENCOUNTER — Encounter: Payer: Self-pay | Admitting: *Deleted

## 2015-11-08 ENCOUNTER — Encounter (HOSPITAL_COMMUNITY): Payer: Self-pay | Admitting: Hematology & Oncology

## 2015-11-08 ENCOUNTER — Encounter (HOSPITAL_BASED_OUTPATIENT_CLINIC_OR_DEPARTMENT_OTHER): Payer: Commercial Managed Care - HMO | Admitting: Hematology & Oncology

## 2015-11-08 ENCOUNTER — Encounter (HOSPITAL_BASED_OUTPATIENT_CLINIC_OR_DEPARTMENT_OTHER): Payer: Commercial Managed Care - HMO

## 2015-11-08 VITALS — BP 124/57 | HR 92 | Temp 97.0°F | Resp 18 | Wt 200.4 lb

## 2015-11-08 DIAGNOSIS — C20 Malignant neoplasm of rectum: Secondary | ICD-10-CM

## 2015-11-08 DIAGNOSIS — Z95828 Presence of other vascular implants and grafts: Secondary | ICD-10-CM

## 2015-11-08 LAB — CBC WITH DIFFERENTIAL/PLATELET
Basophils Absolute: 0 10*3/uL (ref 0.0–0.1)
Basophils Relative: 0 %
Eosinophils Absolute: 0.4 10*3/uL (ref 0.0–0.7)
Eosinophils Relative: 4 %
HEMATOCRIT: 38.8 % — AB (ref 39.0–52.0)
HEMOGLOBIN: 12.8 g/dL — AB (ref 13.0–17.0)
LYMPHS ABS: 1.9 10*3/uL (ref 0.7–4.0)
Lymphocytes Relative: 20 %
MCH: 29.4 pg (ref 26.0–34.0)
MCHC: 33 g/dL (ref 30.0–36.0)
MCV: 89.2 fL (ref 78.0–100.0)
MONO ABS: 0.5 10*3/uL (ref 0.1–1.0)
MONOS PCT: 6 %
NEUTROS ABS: 6.7 10*3/uL (ref 1.7–7.7)
NEUTROS PCT: 71 %
Platelets: 73 10*3/uL — ABNORMAL LOW (ref 150–400)
RBC: 4.35 MIL/uL (ref 4.22–5.81)
RDW: 15.2 % (ref 11.5–15.5)
WBC: 9.5 10*3/uL (ref 4.0–10.5)

## 2015-11-08 LAB — COMPREHENSIVE METABOLIC PANEL
ALK PHOS: 124 U/L (ref 38–126)
ALT: 32 U/L (ref 17–63)
ANION GAP: 8 (ref 5–15)
AST: 34 U/L (ref 15–41)
Albumin: 4.1 g/dL (ref 3.5–5.0)
BILIRUBIN TOTAL: 0.5 mg/dL (ref 0.3–1.2)
BUN: 11 mg/dL (ref 6–20)
CALCIUM: 9.6 mg/dL (ref 8.9–10.3)
CO2: 26 mmol/L (ref 22–32)
Chloride: 105 mmol/L (ref 101–111)
Creatinine, Ser: 1.04 mg/dL (ref 0.61–1.24)
GLUCOSE: 88 mg/dL (ref 65–99)
Potassium: 3.8 mmol/L (ref 3.5–5.1)
Sodium: 139 mmol/L (ref 135–145)
Total Protein: 7.9 g/dL (ref 6.5–8.1)

## 2015-11-08 MED ORDER — HEPARIN SOD (PORK) LOCK FLUSH 100 UNIT/ML IV SOLN
INTRAVENOUS | Status: AC
  Filled 2015-11-08: qty 5

## 2015-11-08 MED ORDER — HYDROCODONE-ACETAMINOPHEN 7.5-325 MG PO TABS: 1.0000 | ORAL_TABLET | Freq: Two times a day (BID) | ORAL | Status: DC | PRN

## 2015-11-08 MED ORDER — HEPARIN SOD (PORK) LOCK FLUSH 100 UNIT/ML IV SOLN
500.0000 [IU] | Freq: Once | INTRAVENOUS | Status: AC
  Administered 2015-11-08: 500 [IU] via INTRAVENOUS

## 2015-11-08 MED ORDER — SODIUM CHLORIDE 0.9 % IJ SOLN
10.0000 mL | Freq: Once | INTRAMUSCULAR | Status: AC
  Administered 2015-11-08: 10 mL via INTRAVENOUS

## 2015-11-08 NOTE — Progress Notes (Signed)
[place] Clinical Social Work  Clinical Social Work was referred by [place] rounding at [facility]. Pt reports he was able to receive gas cards from [name] and was given a Materials engineer through [facility] as well, but then volunteer driver did not confirm. Pt open to CSW contacting [facility] on his behalf. Pt very pleased with support through [name] and headed to support group there today. Pt reports he is doing better and was on time to his appointment today.   Clinical Social Work interventions: Pt advocacy Reassess needs  Loren Racer, [place] Tuesdays   [phone]

## 2015-11-08 NOTE — Patient Instructions (Addendum)
[place] at [facility] Discharge Instructions  RECOMMENDATIONS MADE BY THE CONSULTANT AND ANY TEST RESULTS WILL BE SENT TO YOUR REFERRING PHYSICIAN.    Exam completed by Dr Whitney Muse today Platelets are low, hold chemotherapy Refill on hydroodone today refill on 12/22  Defer x 1week, next Wednesday Cut the dose back on oxalplatin Return to see the doctor in 3 weeks Referral to [facility] for radiation Please call the clinic if you have any questions or concerns     Thank you for choosing [place] at [facility] to provide your oncology and hematology care.  To afford each patient quality time with our provider, please arrive at least 15 minutes before your scheduled appointment time.    You need to re-schedule your appointment should you arrive 10 or more minutes late.  We strive to give you quality time with our providers, and arriving late affects you and other patients whose appointments are after yours.  Also, if you no show three or more times for appointments you may be dismissed from the clinic at the providers discretion.     Again, thank you for choosing [facility].  Our hope is that these requests will decrease the amount of time that you wait before being seen by our physicians.       _____________________________________________________________  Should you have questions after your visit to [facility], please contact our office at [phone] between the hours of 8:30 a.m. and 4:30 p.m.  Voicemails left after 4:30 p.m. will not be returned until the following business day.  For prescription refill requests, have your pharmacy contact our office.

## 2015-11-08 NOTE — Progress Notes (Signed)
Polly[place] Samples presented for Portacath access and flush. Proper placement of portacath confirmed by CXR. Portacath located left chest wall accessed with  H 20 needle. Good blood return present. Portacath flushed with 70ml NS and 500U/50ml Heparin and needle removed intact. Procedure without incident. Patient tolerated procedure well.  HOLD chemotherapy due to low platelets. Reschedule per MD.

## 2015-11-08 NOTE — Patient Instructions (Signed)
[place] at [facility] Discharge Instructions  RECOMMENDATIONS MADE BY THE CONSULTANT AND ANY TEST RESULTS WILL BE SENT TO YOUR REFERRING PHYSICIAN.  Chemotherapy held due to low platelets. Return as scheduled.  Thank you for choosing [place] at [facility] to provide your oncology and hematology care.  To afford each patient quality time with our provider, please arrive at least 15 minutes before your scheduled appointment time.    You need to re-schedule your appointment should you arrive 10 or more minutes late.  We strive to give you quality time with our providers, and arriving late affects you and other patients whose appointments are after yours.  Also, if you no show three or more times for appointments you may be dismissed from the clinic at the providers discretion.     Again, thank you for choosing [facility].  Our hope is that these requests will decrease the amount of time that you wait before being seen by our physicians.       _____________________________________________________________  Should you have questions after your visit to [facility], please contact our office at [phone] between the hours of 8:30 a.m. and 4:30 p.m.  Voicemails left after 4:30 p.m. will not be returned until the following business day.  For prescription refill requests, have your pharmacy contact our office.

## 2015-11-10 ENCOUNTER — Encounter (HOSPITAL_COMMUNITY): Payer: Commercial Managed Care - HMO

## 2015-11-10 ENCOUNTER — Encounter: Payer: Self-pay | Admitting: *Deleted

## 2015-11-10 NOTE — Progress Notes (Signed)
[place] Clinical Social Work  Clinical Social Work contacted [facility] to follow up on ride request from earlier this week. Pt was not provided with ride and there was some sort of mis-communication per [facility]. CSW requested all scheduled appointments for pt for transportation assistance. [facility] will contact pt to update him on availability and to try to resolve ride issue from earlier this week. CSW will continue to follow at [facility] and check in at next appointment.   Clinical Social Work interventions: Set designer assistance   [place] [facility] Tuesdays   [phone]

## 2015-11-11 ENCOUNTER — Other Ambulatory Visit: Payer: Self-pay | Admitting: Licensed Clinical Social Worker

## 2015-11-11 NOTE — Patient Outreach (Signed)
Assessment:  CSW called client home phone number on 11/11/15.  CSW spoke via phone with client on 11/11/15. CSW verified identity of client.  CSW and client spoke of client needs. Client said he has his prescribed medications and is taking medications as prescribed. He said he drives himself to most of his medical appointments.  He said he goes every other week for chemotherapy treatment. He goes to [facility] for chemotherpy treatments.  He is scheduled to have chemotherapy treatments for a total of 6 months. He said he has had about three months of chemotherapy treatments.   He said he goes to the [facility].  He said that the Center has a support unit for counseling support.  He said Center also has classes available for attendees..  He said [facility] has [facility] for [facility].  Client said he has been able to go to the Center to help with sociallization and to visit with friends. He said the Center is close to the client's residence.  He said Nicanor Bake directs the Center and that he enjoys going to the Center for support,for counseling as needed, and for socialization. He said he will begin radiation treatments in January 2017 at [facility] in [place], [place]. He said that a driver has been arranged for him through the [facility] to transport him to and from his scheduled radiation treatments. CSW spoke with client about client care plan. Client said he had gathered some of his financial documents and bills (unmet medical expenses) for past two years. He said he has still not been able to collect all documents needed for a Medicaid application.  CSW again reviewed with client some of the documents he would need to collect to use in applying for Medicaid for client.  CSW encouraged client to collect documents needed for Medicaid application in order that client could soon apply for Medicaid. Client said he would  work to continue to collect documents needed for such an application. CSW spoke with client about [place] and transport costs through that agency.  Client said he was aware of [facility]. CSW gave client [facility] CSW number of [phone] and CSW encouraged client to call CSW as needed to address social work needs of client.     Plan: Client to continue to collect medical bills of client and financial documents of client needed to enable him to be able to apply for Medicaid. CSW to call client in 4 weeks to assess needs of client at that time.  [name].Amalia Edgecombe MSW, LCSW Licensed Clinical Social Worker [facility] Care Management [phone]

## 2015-11-14 DIAGNOSIS — C20 Malignant neoplasm of rectum: Secondary | ICD-10-CM | POA: Diagnosis not present

## 2015-11-16 ENCOUNTER — Encounter (HOSPITAL_COMMUNITY): Payer: Self-pay | Admitting: Lab

## 2015-11-16 ENCOUNTER — Encounter (HOSPITAL_BASED_OUTPATIENT_CLINIC_OR_DEPARTMENT_OTHER): Payer: Commercial Managed Care - HMO

## 2015-11-16 ENCOUNTER — Encounter: Payer: Self-pay | Admitting: *Deleted

## 2015-11-16 VITALS — BP 119/86 | HR 86 | Temp 98.4°F | Resp 18

## 2015-11-16 DIAGNOSIS — C20 Malignant neoplasm of rectum: Secondary | ICD-10-CM

## 2015-11-16 DIAGNOSIS — Z5111 Encounter for antineoplastic chemotherapy: Secondary | ICD-10-CM | POA: Diagnosis not present

## 2015-11-16 LAB — COMPREHENSIVE METABOLIC PANEL
ALBUMIN: 3.9 g/dL (ref 3.5–5.0)
ALT: 29 U/L (ref 17–63)
ANION GAP: 6 (ref 5–15)
AST: 28 U/L (ref 15–41)
Alkaline Phosphatase: 84 U/L (ref 38–126)
BUN: 10 mg/dL (ref 6–20)
CHLORIDE: 104 mmol/L (ref 101–111)
CO2: 27 mmol/L (ref 22–32)
Calcium: 9 mg/dL (ref 8.9–10.3)
Creatinine, Ser: 0.97 mg/dL (ref 0.61–1.24)
GFR calc Af Amer: >60 mL/min (ref >60)
GFR calc non Af Amer: >60 mL/min (ref >60)
GLUCOSE: 88 mg/dL (ref 65–99)
POTASSIUM: 3.7 mmol/L (ref 3.5–5.1)
SODIUM: 137 mmol/L (ref 135–145)
Total Bilirubin: 0.5 mg/dL (ref 0.3–1.2)
Total Protein: 7.5 g/dL (ref 6.5–8.1)

## 2015-11-16 LAB — CBC WITH DIFFERENTIAL/PLATELET
Basophils Absolute: 0 10*3/uL (ref 0.0–0.1)
Basophils Relative: 0 %
EOS ABS: 0.3 10*3/uL (ref 0.0–0.7)
EOS PCT: 9 %
HCT: 37.1 % — ABNORMAL LOW (ref 39.0–52.0)
Hemoglobin: 12.2 g/dL — ABNORMAL LOW (ref 13.0–17.0)
LYMPHS ABS: 1.1 10*3/uL (ref 0.7–4.0)
LYMPHS PCT: 36 %
MCH: 29.3 pg (ref 26.0–34.0)
MCHC: 32.9 g/dL (ref 30.0–36.0)
MCV: 89 fL (ref 78.0–100.0)
MONO ABS: 0.4 10*3/uL (ref 0.1–1.0)
Monocytes Relative: 13 %
Neutro Abs: 1.3 10*3/uL — ABNORMAL LOW (ref 1.7–7.7)
Neutrophils Relative %: 42 %
PLATELETS: 128 10*3/uL — AB (ref 150–400)
RBC: 4.17 MIL/uL — ABNORMAL LOW (ref 4.22–5.81)
RDW: 15.1 % (ref 11.5–15.5)
WBC: 3 10*3/uL — AB (ref 4.0–10.5)

## 2015-11-16 MED ORDER — SODIUM CHLORIDE 0.9 % IV SOLN
Freq: Once | INTRAVENOUS | Status: AC
  Administered 2015-11-16: 10:00:00 via INTRAVENOUS
  Filled 2015-11-16: qty 4

## 2015-11-16 MED ORDER — SODIUM CHLORIDE 0.9 % IV SOLN
1920.0000 mg/m2 | INTRAVENOUS | Status: DC
  Administered 2015-11-16: 3950 mg via INTRAVENOUS
  Filled 2015-11-16: qty 79

## 2015-11-16 MED ORDER — LORAZEPAM 2 MG/ML IJ SOLN
INTRAMUSCULAR | Status: AC
  Filled 2015-11-16: qty 1

## 2015-11-16 MED ORDER — LORAZEPAM 2 MG/ML IJ SOLN
0.5000 mg | Freq: Once | INTRAMUSCULAR | Status: AC
  Administered 2015-11-16: 0.5 mg via INTRAVENOUS

## 2015-11-16 MED ORDER — DEXTROSE 5 % IV SOLN
75.0000 mg/m2 | Freq: Once | INTRAVENOUS | Status: AC
  Administered 2015-11-16: 155 mg via INTRAVENOUS
  Filled 2015-11-16: qty 31

## 2015-11-16 MED ORDER — METHYLPREDNISOLONE SODIUM SUCC 40 MG IJ SOLR
INTRAMUSCULAR | Status: AC
  Filled 2015-11-16: qty 1

## 2015-11-16 MED ORDER — METHYLPREDNISOLONE SODIUM SUCC 40 MG IJ SOLR
40.0000 mg | Freq: Once | INTRAMUSCULAR | Status: AC
  Administered 2015-11-16: 40 mg via INTRAVENOUS

## 2015-11-16 MED ORDER — SODIUM CHLORIDE 0.9 % IJ SOLN: 10.0000 mL | INTRAMUSCULAR | Status: DC | PRN

## 2015-11-16 MED ORDER — DEXTROSE 5 % IV SOLN
Freq: Once | INTRAVENOUS | Status: AC
  Administered 2015-11-16: 10:00:00 via INTRAVENOUS

## 2015-11-16 MED ORDER — LEUCOVORIN CALCIUM INJECTION 350 MG
400.0000 mg/m2 | Freq: Once | INTRAVENOUS | Status: AC
  Administered 2015-11-16: 820 mg via INTRAVENOUS
  Filled 2015-11-16: qty 41

## 2015-11-16 NOTE — Progress Notes (Signed)
Lab work printed off and reviewed by Robynn Pane, PA, Dr.  Muse, and myself.  Patient approved for treatment today .  In consideration of his neutrophil count, he will receive Neulasta when he returns for pump removal 11/18/2015.

## 2015-11-16 NOTE — Progress Notes (Signed)
Referral sent to [facility]. Records faxed on 12/28 

## 2015-11-16 NOTE — Progress Notes (Signed)
Patient c/o nausea without emesis.  Chemotherapy stopped and PA and MD aware, Ativan given as ordered.  When in room to give Ativan the patient c/o itching in BUE.  Ativan had not been given yet.  VSS.  No other s/s reaction or intolerance.  PA notified and in the room.  Order for solu-Medrol which was given to the patient immediately.  VSS again and chemotherapy was restarted per MD instruction.  Patient tolerated the rest of the transfusion well, with no further complaint.

## 2015-11-16 NOTE — Progress Notes (Signed)
[place] Clinical Social Work  Clinical Social Work was referred by [facility] rounding due to previous concerns. Pt reports the [facility] has not followed up on driver as requested. This CSW had contacted [facility] last Wednesday as pt reported driver did not show. Pt states no one called him, but his phone was now off until the 1st as he used up his minutes and can only text. He reports he has received two gas cards from [name] and really enjoys their programs. He was provided with the updated calendar today. He will consider group here at [facility] in the next two weeks. CSW left vm for [facility] inquiring re transportation and will update pt via text when they respond. [facility] to follow and assist accordingly.  Clinical Social Work interventions: Resource assistance Supportive listening Pt advocacy  [place], [place] Tuesdays   [phone]

## 2015-11-16 NOTE — Patient Instructions (Signed)
[facility] Discharge Instructions for Patients Receiving Chemotherapy  Today you received the following chemotherapy agents: Oxaliplatin and 50fu.     If you develop nausea and vomiting, or diarrhea that is not controlled by your medication, call the clinic.  The clinic phone number is [phone]. Office hours are Monday-Friday 8:30am-5:00pm.  BELOW ARE SYMPTOMS THAT SHOULD BE REPORTED IMMEDIATELY:  *FEVER GREATER THAN 101.0 F  *CHILLS WITH OR WITHOUT FEVER  NAUSEA AND VOMITING THAT IS NOT CONTROLLED WITH YOUR NAUSEA MEDICATION  *UNUSUAL SHORTNESS OF BREATH  *UNUSUAL BRUISING OR BLEEDING  TENDERNESS IN MOUTH AND THROAT WITH OR WITHOUT PRESENCE OF ULCERS  *URINARY PROBLEMS  *BOWEL PROBLEMS  UNUSUAL RASH Items with * indicate a potential emergency and should be followed up as soon as possible. If you have an emergency after office hours please contact your primary care physician or go to the nearest emergency department.  Please call the clinic during office hours if you have any questions or concerns.   You may also contact the Patient Navigator at [phone] should you have any questions or need assistance in obtaining follow up care.

## 2015-11-18 ENCOUNTER — Encounter (HOSPITAL_COMMUNITY): Payer: Self-pay

## 2015-11-18 ENCOUNTER — Encounter (HOSPITAL_BASED_OUTPATIENT_CLINIC_OR_DEPARTMENT_OTHER): Payer: Commercial Managed Care - HMO

## 2015-11-18 VITALS — BP 140/89 | HR 98 | Temp 98.0°F | Resp 18

## 2015-11-18 DIAGNOSIS — C20 Malignant neoplasm of rectum: Secondary | ICD-10-CM

## 2015-11-18 DIAGNOSIS — Z5189 Encounter for other specified aftercare: Secondary | ICD-10-CM

## 2015-11-18 MED ORDER — SODIUM CHLORIDE 0.9 % IJ SOLN
10.0000 mL | INTRAMUSCULAR | Status: DC | PRN
Start: 2015-11-18 — End: 2015-11-18
  Administered 2015-11-18: 10 mL
  Filled 2015-11-18: qty 10

## 2015-11-18 MED ORDER — HEPARIN SOD (PORK) LOCK FLUSH 100 UNIT/ML IV SOLN
INTRAVENOUS | Status: AC
  Filled 2015-11-18: qty 5

## 2015-11-18 MED ORDER — PEGFILGRASTIM 6 MG/0.6ML ~~LOC~~ PSKT
PREFILLED_SYRINGE | SUBCUTANEOUS | Status: AC
  Filled 2015-11-18: qty 0.6

## 2015-11-18 MED ORDER — PEGFILGRASTIM 6 MG/0.6ML ~~LOC~~ PSKT
6.0000 mg | PREFILLED_SYRINGE | Freq: Once | SUBCUTANEOUS | Status: AC
  Administered 2015-11-18: 6 mg via SUBCUTANEOUS

## 2015-11-18 MED ORDER — HEPARIN SOD (PORK) LOCK FLUSH 100 UNIT/ML IV SOLN
500.0000 [IU] | Freq: Once | INTRAVENOUS | Status: AC | PRN
  Administered 2015-11-18: 500 [IU]

## 2015-11-18 NOTE — Progress Notes (Signed)
..  [name] returns today for port de access and flush after 46 hr continous infusion of 76fu. Tolerated infusion without problems. Portacath located rt chest wall was  deaccessed and flushed with 58ml NS and 500U/28ml Heparin and needle removed intact.  Procedure without incident. Patient tolerated procedure well.  ....[name] arrived today for [facility] neulasta on body injector. See MAR for administration details. Injector in place and engaged with green light indicator on flashing. Tolerated application with out problems. Reinforcement tape on left corner of device due to slight curling  Of adhesive. Adhered well on discharge.

## 2015-11-24 ENCOUNTER — Other Ambulatory Visit: Payer: Self-pay

## 2015-11-24 DIAGNOSIS — Z800 Family history of malignant neoplasm of digestive organs: Secondary | ICD-10-CM | POA: Diagnosis not present

## 2015-11-24 DIAGNOSIS — Z510 Encounter for antineoplastic radiation therapy: Secondary | ICD-10-CM | POA: Diagnosis not present

## 2015-11-24 DIAGNOSIS — F329 Major depressive disorder, single episode, unspecified: Secondary | ICD-10-CM | POA: Diagnosis not present

## 2015-11-24 DIAGNOSIS — M1990 Unspecified osteoarthritis, unspecified site: Secondary | ICD-10-CM | POA: Diagnosis not present

## 2015-11-24 DIAGNOSIS — C20 Malignant neoplasm of rectum: Secondary | ICD-10-CM | POA: Diagnosis not present

## 2015-11-24 DIAGNOSIS — Z87891 Personal history of nicotine dependence: Secondary | ICD-10-CM | POA: Diagnosis not present

## 2015-11-24 DIAGNOSIS — E7800 Pure hypercholesterolemia, unspecified: Secondary | ICD-10-CM | POA: Diagnosis not present

## 2015-11-24 DIAGNOSIS — Z79899 Other long term (current) drug therapy: Secondary | ICD-10-CM | POA: Diagnosis not present

## 2015-11-24 DIAGNOSIS — I10 Essential (primary) hypertension: Secondary | ICD-10-CM | POA: Diagnosis not present

## 2015-11-24 NOTE — Patient Outreach (Addendum)
[name]) Care Management  11/24/2015  [name] 1960/12/29 [id number]   Telephone Monthly Assessment   Patient reached at cell phone # [phone].  Referral Date: 08/17/15 Referral Source: [place] Referral Issue: [name] Medicare HMO [id number]  Providers: Primary MD: Dr. Rosita Fire Oncologist: Douglas Ranks, MD and Douglas Cancer, PA-C  Pain Management: [name]  Social:  Patient lives in his home; divorced and has 1 child. Patient states he lives with other family members: 6 people living in the home.  Caregiver:Good friend/ [name].  Falls: None Transportation: Owns a [employer] and still driving but states he has no gas money after paying all his bills creating transportation barriers.  DME: [place], CPap (2006), glucometer/Accu-chek (07/2015), eye glasses  Financial: H/o  Issues with specialty co-pays $45.00 and accumulation of hospital bills. Financial Assessment completed by [name] 10/21/15.  Medicaid application. H/o patient has still not collected documents needed to apply for Medicaid.  Patient to continue to gather needed financial documents requested by [name] to help him be able to apply for Medicaid. Resource:  -Physicist, medical -SSD (2006) due to back and left leg issues.  -Medicare "Extra Help" -[name] for supportive counseling and gas card resource.  Advanced Directives:  None. Patient interested in completing and confirms he has the [facility] Advanced Directive document.  Patient elects his ex-wife, [name] to be placed on his Advanced Directive.   H/o [facility] [name] addressed on [name] contact: patient to read/review advanced directives booklet and communicate, as needed, with CSW about advanced directives preparation..   [name]:  Stage 3 Rectal [name] Chemo: yes.  Home Infusion Pump and Portacath management  Chemo to start 11/30/2015 ([name] [employer] providing  transportation services).  Radiation to start 12/02/2015 (patient has not arranged transportation services yet; states he has the number at home and will call to discuss options. Douglas Hatchet, LCSW at [facility] assisting with transportation.)  HTN (2010) Patient is taking BP medication everyday.   Diabetes (2010) Patient is taking no medication to manage blood sugar. BS readings: Checks 1-2 times a day.  A1c: 6.3 on 06/30/15   Medications Taking less than 10 medications Most have no co-pay;active with Medicare "Extra Help".  Using [facility] mail order.  No medication issues identified this call.   Pain Management  H/o Douglas Cancer, PA-C Per Epic MR note: "will continue to refill pain medications previously prescribed without any dose changes until Nov 20, 2015 as this is managed by [name]. Patient has an outstanding balance at [name] office and patient will pay that down in time for him to be re-established."  [place] completed 10/06/15.  Patient states he has not seen Dr. Sheldon [name] due to the inability to pay this bill.  States Oncologist is overseeing pain management meds during this time.   PLAN Case Management Start Date: 08/19/15 Screening Date: 08/19/15 Initial Assessment Date: 09/20/2015 Program: Other ([name]) 08/19/15  RN CM will continue to assess for resolution to the following: -Medicaid application - pending patient gathering documents [name] remains active).  -Transportation - current resource in place -Advanced Directive: pending patient reading and preparing document for completion.   RN CM advised in next follow-call within 30 days for Monthly Telephone Assessment  RN CM advised to please notify MD of any changes in condition prior to scheduled appt's.  RN CM provided contact name and #, 24-hour nurse line # [phone].  RN CM confirmed patient is aware of 911 services for urgent emergency  needs..  RN CM sent primary MD Physician Involvement letter and Initial Assessment.   Douglas Laster, RN, BSN, [facility], CCM  [employer] Management Coordinator [phone] Direct [phone] Cell [phone] Office [phone] Fax

## 2015-11-30 ENCOUNTER — Encounter (HOSPITAL_BASED_OUTPATIENT_CLINIC_OR_DEPARTMENT_OTHER): Payer: Commercial Managed Care - HMO | Admitting: Hematology & Oncology

## 2015-11-30 ENCOUNTER — Encounter (HOSPITAL_COMMUNITY): Payer: Commercial Managed Care - HMO | Attending: Hematology & Oncology

## 2015-11-30 ENCOUNTER — Encounter (HOSPITAL_COMMUNITY): Payer: Self-pay | Admitting: Hematology & Oncology

## 2015-11-30 VITALS — BP 129/87 | HR 101 | Temp 97.7°F | Resp 20 | Wt 203.6 lb

## 2015-11-30 DIAGNOSIS — E119 Type 2 diabetes mellitus without complications: Secondary | ICD-10-CM

## 2015-11-30 DIAGNOSIS — C779 Secondary and unspecified malignant neoplasm of lymph node, unspecified: Secondary | ICD-10-CM

## 2015-11-30 DIAGNOSIS — I10 Essential (primary) hypertension: Secondary | ICD-10-CM | POA: Insufficient documentation

## 2015-11-30 DIAGNOSIS — Z87891 Personal history of nicotine dependence: Secondary | ICD-10-CM

## 2015-11-30 DIAGNOSIS — M545 Low back pain: Secondary | ICD-10-CM | POA: Diagnosis not present

## 2015-11-30 DIAGNOSIS — C20 Malignant neoplasm of rectum: Secondary | ICD-10-CM

## 2015-11-30 DIAGNOSIS — Z808 Family history of malignant neoplasm of other organs or systems: Secondary | ICD-10-CM

## 2015-11-30 LAB — COMPREHENSIVE METABOLIC PANEL
ALBUMIN: 4.1 g/dL (ref 3.5–5.0)
ALK PHOS: 91 U/L (ref 38–126)
ALT: 32 U/L (ref 17–63)
ANION GAP: 7 (ref 5–15)
AST: 35 U/L (ref 15–41)
BILIRUBIN TOTAL: 0.6 mg/dL (ref 0.3–1.2)
BUN: 12 mg/dL (ref 6–20)
CALCIUM: 9.6 mg/dL (ref 8.9–10.3)
CHLORIDE: 107 mmol/L (ref 101–111)
CO2: 25 mmol/L (ref 22–32)
CREATININE: 1.02 mg/dL (ref 0.61–1.24)
GFR calc non Af Amer: >60 mL/min (ref >60)
Glucose, Bld: 137 mg/dL — ABNORMAL HIGH (ref 65–99)
Potassium: 3.6 mmol/L (ref 3.5–5.1)
SODIUM: 139 mmol/L (ref 135–145)
Total Protein: 8 g/dL (ref 6.5–8.1)

## 2015-11-30 LAB — CBC WITH DIFFERENTIAL/PLATELET
BASOS ABS: 0 10*3/uL (ref 0.0–0.1)
BASOS PCT: 1 %
Eosinophils Absolute: 0.2 10*3/uL (ref 0.0–0.7)
Eosinophils Relative: 5 %
HEMATOCRIT: 39.6 % (ref 39.0–52.0)
HEMOGLOBIN: 13.1 g/dL (ref 13.0–17.0)
Lymphocytes Relative: 27 %
Lymphs Abs: 1.1 10*3/uL (ref 0.7–4.0)
MCH: 29.2 pg (ref 26.0–34.0)
MCHC: 33.1 g/dL (ref 30.0–36.0)
MCV: 88.4 fL (ref 78.0–100.0)
Monocytes Absolute: 0.6 10*3/uL (ref 0.1–1.0)
Monocytes Relative: 16 %
NEUTROS ABS: 2.1 10*3/uL (ref 1.7–7.7)
NEUTROS PCT: 51 %
Platelets: 144 10*3/uL — ABNORMAL LOW (ref 150–400)
RBC: 4.48 MIL/uL (ref 4.22–5.81)
RDW: 15 % (ref 11.5–15.5)
WBC: 4 10*3/uL (ref 4.0–10.5)

## 2015-11-30 MED ORDER — GABAPENTIN 800 MG PO TABS: 800.0000 mg | ORAL_TABLET | Freq: Three times a day (TID) | ORAL | Status: AC

## 2015-11-30 MED ORDER — SODIUM CHLORIDE 0.9 % IJ SOLN: 10.0000 mL | INTRAMUSCULAR | Status: DC | PRN

## 2015-11-30 MED ORDER — HYDROCODONE-ACETAMINOPHEN 7.5-325 MG PO TABS: 1.0000 | ORAL_TABLET | Freq: Two times a day (BID) | ORAL | Status: DC | PRN

## 2015-11-30 MED ORDER — LISINOPRIL 40 MG PO TABS: 40.0000 mg | ORAL_TABLET | Freq: Every morning | ORAL | Status: AC

## 2015-11-30 MED ORDER — SODIUM CHLORIDE 0.9 % IV SOLN: 1920.0000 mg/m2 | INTRAVENOUS | Status: DC

## 2015-11-30 MED ORDER — TRAMADOL HCL 50 MG PO TABS: ORAL_TABLET | ORAL | Status: DC

## 2015-11-30 MED ORDER — TIZANIDINE HCL 2 MG PO TABS: 2.0000 mg | ORAL_TABLET | Freq: Three times a day (TID) | ORAL | Status: AC | PRN

## 2015-11-30 MED ORDER — DEXTROSE 5 % IV SOLN: 400.0000 mg/m2 | Freq: Once | INTRAVENOUS | Status: DC

## 2015-11-30 MED ORDER — OXALIPLATIN CHEMO INJECTION 100 MG/20ML: 85.0000 mg/m2 | Freq: Once | INTRAVENOUS | Status: DC

## 2015-11-30 MED ORDER — DEXAMETHASONE SODIUM PHOSPHATE 100 MG/10ML IJ SOLN: Freq: Once | INTRAMUSCULAR | Status: DC

## 2015-11-30 MED ORDER — SODIUM CHLORIDE 0.9 % IJ SOLN: 10.0000 mL | Freq: Once | INTRAMUSCULAR | Status: DC

## 2015-11-30 MED ORDER — NORTRIPTYLINE HCL 75 MG PO CAPS: 75.0000 mg | ORAL_CAPSULE | Freq: Every day | ORAL | Status: AC

## 2015-11-30 MED ORDER — HEPARIN SOD (PORK) LOCK FLUSH 100 UNIT/ML IV SOLN
INTRAVENOUS | Status: AC
  Filled 2015-11-30: qty 5

## 2015-11-30 MED ORDER — HEPARIN SOD (PORK) LOCK FLUSH 100 UNIT/ML IV SOLN
500.0000 [IU] | Freq: Once | INTRAVENOUS | Status: AC
  Administered 2015-11-30: 500 [IU] via INTRAVENOUS

## 2015-11-30 MED ORDER — DEXTROSE 5 % IV SOLN: Freq: Once | INTRAVENOUS | Status: DC

## 2015-11-30 MED ORDER — AMLODIPINE BESYLATE 5 MG PO TABS: 5.0000 mg | ORAL_TABLET | Freq: Every morning | ORAL | Status: DC

## 2015-11-30 NOTE — Progress Notes (Signed)
[place] at [facility] Progress Note  Patient Care Team: Rosita Fire, MD as PCP - General (Internal Medicine) Daneil Dolin, MD as Consulting Physician (Gastroenterology) Standley Brooking, RN as [place], [place] as [place] (Licensed Clinical Social Worker)  CHIEF COMPLAINTS/PURPOSE OF CONSULTATION:  Stage IIIA Rectal carcinoma Robotic assisted LAR, rigid proctoscopy with Dr. [name] 1/61/0960 Lymph nodes: number examined 14; number positive: 1 Pathologic Staging: pT1, pN1a, pMX Normal preoperative CEA Adjuvant FOLFOX Plans for concurrent XRT/5-FU  HISTORY OF PRESENTING ILLNESS:  [name] 55 y.o. male is here for follow-up of his rectal cancer. He has undergone definitive surgical therapy and did remarkably well. Unfortunately he did have one lymph node that contained metastatic disease. CT imaging prior to surgery revealed no evidence of metastatic disease. He has started chemotherapy with FOLFOX, we plan on "sandwiching" in infusional 5-FU/XRT. Plan is for 6 months adjuvant therapy.  [name] returns to the [place] alone today. He confirms that he met with radiation in [place] on Tuesday with Dr. Lisbeth Renshaw, but there is some confusion about when his radiation treatments there begin, with mention of both Friday and Monday. After consulting with him about this for the duration of the appointment, it was verified that on Friday 1/13 he will go to [facility] and they will verify that he's ready to start radiation. Then, on Monday 1/16, he will start radiation in [place], and get his pump placed here. The following Monday 1/23 he will come see [place] again, and follow-up each following Monday. We will check on him at those points.  In terms of his pain symptoms and medications he needs refilled, [name] says that he is having "back spasm" and trouble with his blood pressure. He remarks  that Dr. Merlene Laughter was historically writing all of his prescriptions, but that he currently owes Dr. Merlene Laughter an outstanding payment, and until he pays this, he cannot see him again. [name] states that Dr. Merlene Laughter was dealing with his back pain and sleep apnea in terms of specialty. He remarks that he has also been seeing Dr. Legrand [name] for his diabetes over the past 2-3 months.  His medicines are filled through [facility]. Before he can see Dr. Merlene Laughter again, [name] states that he needs refills on his nortriptyline, zanaflex, and gabapentin and his BP medications.   MEDICAL HISTORY:  Past Medical History  Diagnosis Date  . Hypertension   . High cholesterol   . Sleep apnea   . Rectal cancer ([place])   . Depression   . Arthritis   . Diabetes mellitus     "Borderline"  . GERD (gastroesophageal reflux disease)     No weakness  . Neuropathy ([place])     "back missed up"    SURGICAL HISTORY: Past Surgical History  Procedure Laterality Date  . Back surgery    . Cholecystectomy    . Colonoscopy with propofol N/A 03/31/2015    Procedure: COLONOSCOPY WITH PROPOFOL at cecum 0842; withdrawal time=5mnutes;  Surgeon: RDaneil Dolin MD;  Location: [facility];  Service: Endoscopy;  Laterality: N/A;  . Polypectomy  03/31/2015    Procedure: POLYPECTOMY;  Surgeon: RDaneil Dolin MD;  Location: [facility];  Service: Endoscopy;;  . Esophageal biopsy  03/31/2015    Procedure: BIOPSY;  Surgeon: RDaneil Dolin MD;  Location: [facility];  Service: Endoscopy;;  . Flexible sigmoidoscopy N/A 07/05/2015    Procedure: FOtho Darner  SIGMOIDOSCOPY;  Surgeon: [name], MD;  Location: [facility];  Service: Endoscopy;  Laterality: N/A;  with tattoo  . Xi robotic assisted lower anterior resection N/A 07/06/2015    Procedure: XI ROBOTIC ASSISTED LOWER ANTERIOR RESECTION, rigid proctoscopy;  Surgeon: [name], MD;  Location: [facility];  Service: General;  Laterality: N/A;  . Portacath placement N/A 08/09/2015    Procedure:  INSERTION PORT-A-CATH LEFT SUBCLAVIAN;  Surgeon: [name], MD;  Location: [place] [facility];  Service: General;  Laterality: N/A;    SOCIAL HISTORY: Social History   Social History  . Marital Status: Legally Separated    Spouse Name: N/A  . Number of Children: N/A  . Years of Education: N/A   Occupational History  . Not on file.   Social History Main Topics  . Smoking status: Former Smoker    Quit date: 03/23/2006  . Smokeless tobacco: Never Used  . Alcohol Use: No  . Drug Use: No  . Sexual Activity: Not Currently   Other Topics Concern  . 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 Colon cancer Neg Hx     "Not that I know of"  . Colon polyps Neg Hx     "not that I know of"  . Hypertension      "Not sure who, I don't know much about my family"  . Diabetes      "Not sure who, I don't know much about my family"   has no family status information on file.  Mother deceased, 80, liver cancer Father living, 56 3 brothers 1 sister  ALLERGIES:  has No Known Allergies.  MEDICATIONS:  Current Outpatient Prescriptions  Medication Sig Dispense Refill  . ACCU-CHEK SMARTVIEW test strip     . amLODipine (NORVASC) 5 MG tablet Take 1 tablet (5 mg total) by mouth every morning. 30 tablet 2  . dextrose 5 % SOLN 1,000 mL with fluorouracil 5 GM/100ML SOLN Inject into the vein. To be given every 14 days x 12 cycles. To run for 46 hours. Has not started yet.    . gabapentin (NEURONTIN) 800 MG tablet Take 1 tablet (800 mg total) by mouth 3 (three) times daily. 90 tablet 2  . HYDROcodone-acetaminophen (NORCO) 7.5-325 MG tablet Take 1 tablet by mouth every 12 (twelve) hours as needed for moderate pain. 45 tablet 0  . LEUCOVORIN CALCIUM IJ Inject as directed. To be given every 14 days x 12 cycles.  Has not started yet.    . lidocaine-prilocaine (EMLA) cream Apply a quarter size amount to port site 1 hour prior to chemo. Do not rub in. Cover with plastic wrap. 30 g 3  . lisinopril (PRINIVIL,ZESTRIL) 40 MG tablet Take 1 tablet (40 mg total) by mouth every morning. 30 tablet 2  . NON FORMULARY PT HAS A C-PAP MACHINE    . nortriptyline (PAMELOR) 75 MG capsule Take 1 capsule (75 mg total) by mouth at bedtime. 30 capsule 2  . OXALIPLATIN IV Inject into the vein. To be given every 14 days x 12 cycles. Has not started yet.    Marland Kitchen tiZANidine (ZANAFLEX) 2 MG tablet Take 1 tablet (2 mg total) by mouth every 8 (eight) hours as needed for muscle spasms. 30 tablet 2  . traMADol (ULTRAM) 50 MG tablet May  take 1-2 tabs every 6 hours as needed for pain 120 tablet 0  . ondansetron (ZOFRAN) 8 MG tablet Take 1 tablet (8 mg total) by mouth 2 (two) times daily. Start the day after chemo for 2 days. Then take as needed for nausea or vomiting. (Patient not taking: Reported on 10/06/2015) 30 tablet 1  . prochlorperazine (COMPAZINE) 10 MG tablet Take 1 tablet (10 mg total) by mouth every 6 (six) hours as needed (Nausea or vomiting). (Patient not taking: Reported on 10/06/2015) 30 tablet 1   Current Facility-Administered Medications  Medication Dose Route Frequency Provider Last Rate Last Dose  . sodium chloride 0.9 % injection 10 mL  10 mL Intravenous Once Patrici Ranks, MD       Facility-Administered Medications Ordered in Other Visits  Medication Dose Route Frequency Provider Last Rate Last Dose  . sodium chloride 0.9 % injection 10 mL  10 mL Intracatheter PRN Patrici Ranks, MD        Review of Systems  Constitutional: Negative for fever, chills, weight loss and malaise/fatigue.  HENT: Negative for congestion, hearing loss, nosebleeds, sore throat and tinnitus.   Eyes: Negative for blurred vision, double vision, pain and discharge.  Respiratory: Negative for cough, hemoptysis, sputum production,  shortness of breath and wheezing.   Cardiovascular: Negative for chest pain, palpitations, claudication, leg swelling and PND.  Gastrointestinal: Negative for heartburn, nausea, vomiting, abdominal pain, diarrhea, constipation, blood in stool and melena.  Genitourinary: Negative for dysuria, urgency, frequency and hematuria.  Musculoskeletal: Negative for myalgias, joint pain and falls.  Skin: Negative for itching and rash.  Neurological: Negative for dizziness, tingling, tremors, sensory change, speech change, focal weakness, seizures, loss of consciousness, weakness and headaches.  Endo/Heme/Allergies: Does not bruise/bleed easily.  Psychiatric/Behavioral: Negative for depression, suicidal ideas, memory loss and substance abuse. The patient is not nervous/anxious and does not have insomnia.   All other systems reviewed and are negative.  14 point ROS was done and is otherwise as detailed above or in HPI   PHYSICAL EXAMINATION: ECOG PERFORMANCE STATUS: 0 - Asymptomatic   Vitals - 1 value per visit 6/57/9038  SYSTOLIC 333  DIASTOLIC 87  Pulse 832  Temperature 97.7  Respirations 20  Weight (lb) 203.6  Height   BMI 36.08    Physical Exam  Constitutional: He is oriented to person, place, and time and well-developed, well-nourished, and in no distress.  HENT:  Head: Normocephalic and atraumatic.  Nose: Nose normal.  Mouth/Throat: Oropharynx is clear and moist. No oropharyngeal exudate.  Eyes: Conjunctivae and EOM are normal. Pupils are equal, round, and reactive to light. Right eye exhibits no discharge. Left eye exhibits no discharge. No scleral icterus.  Neck: Normal range of motion. Neck supple. No tracheal deviation present. No thyromegaly present.  Cardiovascular: Normal rate, regular rhythm and normal heart sounds.  Exam reveals no gallop and no friction rub.   No murmur heard. Pulmonary/Chest: Effort normal and breath sounds normal. He has no wheezes. He has no rales.    Abdominal: Soft. Bowel sounds are normal. He exhibits no distension and no mass. There is no tenderness. There is no rebound and no guarding.  well-healing surgical incision sites  Genitourinary:  Musculoskeletal: Normal range of motion. He exhibits no edema.  Lymphadenopathy:    He has no cervical adenopathy.  Neurological: He is alert and oriented to person, place, and time. He has normal reflexes. No cranial nerve deficit. Gait normal. Coordination normal.  Skin: Skin is warm and  dry. No rash noted.  Psychiatric: Mood, memory, affect and judgment normal.  Nursing note and vitals reviewed.   LABORATORY DATA:  I have reviewed the data as listed Results for REMMY, RIFFE (MRN [id number])   CBC    Component Value Date/Time   WBC 4.0 11/30/2015 0948   RBC 4.48 11/30/2015 0948   HGB 13.1 11/30/2015 0948   HCT 39.6 11/30/2015 0948   PLT 144* 11/30/2015 0948   MCV 88.4 11/30/2015 0948   MCH 29.2 11/30/2015 0948   MCHC 33.1 11/30/2015 0948   RDW 15.0 11/30/2015 0948   LYMPHSABS 1.1 11/30/2015 0948   MONOABS 0.6 11/30/2015 0948   EOSABS 0.2 11/30/2015 0948   BASOSABS 0.0 11/30/2015 0948    CMP     Component Value Date/Time   NA 139 11/30/2015 0948   K 3.6 11/30/2015 0948   CL 107 11/30/2015 0948   CO2 25 11/30/2015 0948   GLUCOSE 137* 11/30/2015 0948   BUN 12 11/30/2015 0948   CREATININE 1.02 11/30/2015 0948   CALCIUM 9.6 11/30/2015 0948   PROT 8.0 11/30/2015 0948   ALBUMIN 4.1 11/30/2015 0948   AST 35 11/30/2015 0948   ALT 32 11/30/2015 0948   ALKPHOS 91 11/30/2015 0948   BILITOT 0.6 11/30/2015 0948   GFRNONAA >60 11/30/2015 0948   GFRAA >60 11/30/2015 0948      PATHOLOGY:REPORT OF SURGICAL PATHOLOGY ADDITIONAL INFORMATION: 1. Mismatch Repair (MMR) Protein Immunohistochemistry (IHC) IHC Expression Result: MLH1: Preserved nuclear expression (greater 50% tumor expression) MSH2: Preserved nuclear expression (greater 50% tumor expression) MSH6: Preserved nuclear  expression (greater 50% tumor expression) PMS2: Preserved nuclear expression (greater 50% tumor expression) * Internal control demonstrates intact nuclear expression Interpretation: NORMAL There is preserved expression of the major and minor MMR proteins. There is a very low probability that microsatellite instability (MSI) is present. However, certain clinically significant MMR protein mutations may result in preservation of nuclear expression. It is recommended that the preservation of protein expression be correlated with molecular based MSI testing. References: 1. Guidelines on Genetic Evaluation and Management of Lynch Syndrome: A Consensus Statement by the [place] Multi-Society Task Force on Colorectal Cancer Gae Dry. Sherlie Ban , MD, and others . Am Nicki [place] 2014; [phone]; doi: 10.1038/ajg.2014.186; published online 09 June 2013 2. Outcomes of screening endometrial cancer patients for Lynch syndrome by patient-administered checklist. Olena Heckle MS, and others. Gynecol Oncol 2013;131(3):619-623. Mali RUND DO Pathologist, Electronic Signature ( Signed 07/11/2015) FINAL DIAGNOSIS Diagnosis 1. Colon, segmental resection for tumor, rectosigmoid - INVASIVE ADENOCARCINOMA, SEE COMMENT. - TUMOR INVADES INTO SUBMUCOSA. 1 of 4 FINAL for TAGG, EUSTICE A [phone]) Diagnosis(continued) - ONE LYMPH NODE, POSITIVE FOR METASTATIC TUMOR (1/14). - SURGICAL MARGINS, NEGATIVE FOR TUMOR. - SEE TUMOR SYNOPTIC TEMPLATE BELOW. 2. Colon, resection margin (donut), final distal margin - BENIGN COLON. - NEGATIVE FOR DYSPLASIA OR MALIGNANCY. Microscopic Comment 1. COLON AND RECTUM (INCLUDING TRANS-ANAL RESECTION): Specimen: Rectosigmoid Procedure: Resection Tumor site: Mid rectum Specimen integrity: Intact Macroscopic intactness of mesorectum: Near complete Macroscopic tumor perforation: Absent Invasive tumor: Maximum size: 2.0 cm Histologic type(s): Adenocarcinoma Histologic grade and  differentiation: G2: moderately differentiated/low grade Type of polyp in which invasive carcinoma arose: Adenoma with high grade dysplasia Microscopic extension of invasive tumor: Tumor extends into submucosa and is immediately adjacent to muscularis propria. Lymph-Vascular invasion: Present Peri-neural invasion: Present Tumor deposit(s) (discontinuous extramural extension): Absent Resection margins: Proximal margin: Negative Distal margin: Negative Circumferential (radial) (posterior ascending, posterior descending; lateral and posterior mid-rectum; and entire lower 1/3 rectum): Negative  Mesenteric margin (sigmoid and transverse): Negative Distance closest margin (if all above margins negative): 1.0 cm (distal) Treatment effect (neo-adjuvant therapy): None Additional polyp(s): None Non-neoplastic findings: None Lymph nodes: number examined 14; number positive: 1 Pathologic Staging: pT1, pN1a, pMX Ancillary studies: Per the [place] Gastrointestinal Oncology Working group Guidelines, tumor will be submitted for both microsatellite instability by PCR and mismatch repair protein expression by immunohistochemistry. The results will be reported in an addendum. (CR:kh 07-08-15) Mali RUND DO Pathologist, Electronic Signature (Case signed 07/08/2015) Specimen Gross and Clinical Information 2 of 4 FINAL for DEMETRIC, DUNNAWAY A [phone]) Specimen(s) Obtained: 1. Colon, segmental resection for tumor, rectosigmoid 2. Colon, resection margin (donut), final distal margin Specimen Clinical Information 1. rectal cancer (kp) Gross 1. Specimen: Rectosigmoid, to include at least middle third of rectum. Specimen integrity: Intact. Specimen length: 31 cm. Mesorectal intactness: Near complete. Tumor location: Middle third of rectum, posterior wall, 6.0 cm from the sigmorectal junction. Tumor size: 2.0 cm in diameter tan red indurated, sessile ill-defined mass. Percent of bowel circumference  involved: Approximately 20%. Tumor distance to margins: Proximal: 28 cm. Distal: 1 cm. Mesenteric (sigmoid and transverse): 11 cm. Radial (posterior ascending, posterior descending; lateral and posterior mid-rectum; and entire lower 1/3 rectum): 0.5 cm to the mesorectal soft tissue margin. Macroscopic extent of tumor invasion: The tumor involves, but does not grossly completely transect the muscularis. Total presumed lymph nodes: 14 possible lymph nodes ranging from 0.1 to 0.8 cm. Extramural satellite tumor nodules: None. Mucosal polyp(s): None. Additional findings: None. Block summary: A = shave of proximal margin B,C = shave of entire distal margin D-H = tumor, entirely submitted I = four nodes, whole J = four nodes, whole K = four nodes, whole L = two nodes, whole Total: 12 blocks submitted 2. Received in formalin is a 1.1 cm in diameter and 1.3 cm thick portion of tissue with tan to hyperemic, smooth mucosa on one end. Sectioned and entirely submitted in one block. (SW:ds 07/07/15) Stain(s) used in Diagnosis: The following stain(s) were used in diagnosing the case: MSH6, MLH1, MSH2, PMS2. The control(s) stained appropriately. Disclaimer Some of these immunohistochemical stains may have been developed and the performance characteristics determined by [facility]. Some may not have been cleared or approved by the U.S. Food and Drug Administration. The FDA has determined that such clearance or approval is not necessary. This test is used for clinical purposes. It should not be regarded as investigational or for research. This laboratory is certified under the [place] (CLIA-88) as qualified to perform high complexity clinical laboratory testing. Report signed out from the following location(s) Technical Component was performed at [facility],[name] [address].LOVF:[id number]., 3 of 4 FINAL for ROSTON, GRUNEWALD A [phone]) Report signed out from the following location(s)(continued) Technical Component was performed at [place], [place], [place] [address]. CLIA #: [id number], Technical component and interpretation was performed at [facility], [place], [place] [address]. CLIA #: [id number], 4 of 4   ASSESSMENT & PLAN:  T1, N1, M0 adenocarcinoma of the rectum, Stage IIIA Robotic assisted LAR with Dr. [name] Normal preoperative CEA Adjuvant FOLFOX (will also receive infusion 5-FU/XRT)  Overall he has done well with chemotherapy with principal issues revolving around thrombocytopenia.  We have eliminated his 5-FU bolus and dose reduced his 5-FU.  He is to start XRT next Monday. Plan is to start infusion 5-FU on  Monday concurrently with XRT D1-42. This has been taught to the patient and reviewed again today  He will return on Monday for pump placement.  He has an unpaid medical balance with Dr. Merlene Laughter and therefore is out of multiple medications, given his current cancer related expenses, we will refill his medications today, printing his pain medication and mailing in the other ones. He has his medications filled through [facility].  On Monday 1/23 he will follow up with either Tom or myself, to check on his pump and his progress.  In the meantime, we will work with [name] to help him re-establish with Dr. Merlene Laughter so he can see him again for ongoing care.  This document serves as a record of services personally performed by Ancil Linsey, MD. It was created on her behalf by Toni Amend, a trained medical scribe. The creation of this record is based on the scribe's personal observations and the provider's statements to them. This document has been checked and approved by the attending provider.  I have reviewed the above  documentation for accuracy and completeness, and I agree with the above.  All questions were answered. The patient knows to call the clinic with any problems, questions or concerns.  This note was electronically signed.  Kelby Fam. Whitney Muse, MD

## 2015-11-30 NOTE — Progress Notes (Signed)
Patient reports "that thing y'all put on my arm fell off the same night it was put on". Assuming patient is referring to neulasta onpro body injector. Patient said he started to call on Saturday but he knew we were closed. Asked patient why he didn't call first thing Monday morning and he said he just didn't think about it.  Pollyann Samples presented for Portacath access and flush. Proper placement of portacath confirmed by CXR. Portacath located left chest wall accessed with  H 20 needle. Good blood return present. Portacath flushed with 91ml NS and 500U/38ml Heparin and needle removed intact. Procedure without incident. Patient tolerated procedure well.

## 2015-11-30 NOTE — Progress Notes (Signed)
[name] presented for Portacath access and flush. Proper placement of portacath confirmed by CXR. Portacath located lt chest wall accessed with  H 20 needle. Good blood return present. Portacath flushed with 79ml NS and 500U/59ml Heparin and needle removed intact. Procedure without incident. Patient tolerated procedure well.  No chemo today since patient is starting XRT on Monday 12/05/15.

## 2015-11-30 NOTE — Patient Instructions (Signed)
[place] at [facility] Discharge Instructions  RECOMMENDATIONS MADE BY THE CONSULTANT AND ANY TEST RESULTS WILL BE SENT TO YOUR REFERRING PHYSICIAN.  Exam and discussion by Dr. Whitney Muse.  Hold treatment today due to starting radiation on Monday, 12/05/15. 5FU pump placement on Monday, 12/05/15. Refill medications today - will continue to fill while on chemotherapy.  Thank you for choosing [place] at [facility] to provide your oncology and hematology care.  To afford each patient quality time with our provider, please arrive at least 15 minutes before your scheduled appointment time.    You need to re-schedule your appointment should you arrive 10 or more minutes late.  We strive to give you quality time with our providers, and arriving late affects you and other patients whose appointments are after yours.  Also, if you no show three or more times for appointments you may be dismissed from the clinic at the providers discretion.     Again, thank you for choosing [facility].  Our hope is that these requests will decrease the amount of time that you wait before being seen by our physicians.       _____________________________________________________________  Should you have questions after your visit to [facility], please contact our office at [phone] between the hours of 8:30 a.m. and 4:30 p.m.  Voicemails left after 4:30 p.m. will not be returned until the following business day.  For prescription refill requests, have your pharmacy contact our office.

## 2015-12-01 DIAGNOSIS — Z800 Family history of malignant neoplasm of digestive organs: Secondary | ICD-10-CM | POA: Diagnosis not present

## 2015-12-01 DIAGNOSIS — C20 Malignant neoplasm of rectum: Secondary | ICD-10-CM | POA: Diagnosis not present

## 2015-12-01 DIAGNOSIS — F329 Major depressive disorder, single episode, unspecified: Secondary | ICD-10-CM | POA: Diagnosis not present

## 2015-12-01 DIAGNOSIS — E7800 Pure hypercholesterolemia, unspecified: Secondary | ICD-10-CM | POA: Diagnosis not present

## 2015-12-01 DIAGNOSIS — I10 Essential (primary) hypertension: Secondary | ICD-10-CM | POA: Diagnosis not present

## 2015-12-01 DIAGNOSIS — M1990 Unspecified osteoarthritis, unspecified site: Secondary | ICD-10-CM | POA: Diagnosis not present

## 2015-12-01 DIAGNOSIS — Z510 Encounter for antineoplastic radiation therapy: Secondary | ICD-10-CM | POA: Diagnosis not present

## 2015-12-01 DIAGNOSIS — Z79899 Other long term (current) drug therapy: Secondary | ICD-10-CM | POA: Diagnosis not present

## 2015-12-01 DIAGNOSIS — Z87891 Personal history of nicotine dependence: Secondary | ICD-10-CM | POA: Diagnosis not present

## 2015-12-02 ENCOUNTER — Encounter (HOSPITAL_COMMUNITY): Payer: Commercial Managed Care - HMO

## 2015-12-02 ENCOUNTER — Encounter: Payer: Self-pay | Admitting: Dietician

## 2015-12-02 DIAGNOSIS — M1990 Unspecified osteoarthritis, unspecified site: Secondary | ICD-10-CM | POA: Diagnosis not present

## 2015-12-02 DIAGNOSIS — Z79899 Other long term (current) drug therapy: Secondary | ICD-10-CM | POA: Diagnosis not present

## 2015-12-02 DIAGNOSIS — C20 Malignant neoplasm of rectum: Secondary | ICD-10-CM | POA: Diagnosis not present

## 2015-12-02 DIAGNOSIS — Z510 Encounter for antineoplastic radiation therapy: Secondary | ICD-10-CM | POA: Diagnosis not present

## 2015-12-02 DIAGNOSIS — E7800 Pure hypercholesterolemia, unspecified: Secondary | ICD-10-CM | POA: Diagnosis not present

## 2015-12-02 DIAGNOSIS — Z800 Family history of malignant neoplasm of digestive organs: Secondary | ICD-10-CM | POA: Diagnosis not present

## 2015-12-02 DIAGNOSIS — Z87891 Personal history of nicotine dependence: Secondary | ICD-10-CM | POA: Diagnosis not present

## 2015-12-02 DIAGNOSIS — I10 Essential (primary) hypertension: Secondary | ICD-10-CM | POA: Diagnosis not present

## 2015-12-02 DIAGNOSIS — F329 Major depressive disorder, single episode, unspecified: Secondary | ICD-10-CM | POA: Diagnosis not present

## 2015-12-02 NOTE — Progress Notes (Signed)
Pt had requested education/reccomendations for better glycemic control.   Wt Readings from Last 10 Encounters:  11/30/15 203 lb 9.6 oz (92.352 kg)  11/08/15 200 lb 6.4 oz (90.901 kg)  10/25/15 203 lb 3.2 oz (92.171 kg)  10/18/15 202 lb 1.6 oz (91.672 kg)  10/04/15 209 lb (94.802 kg)  09/26/15 207 lb (93.895 kg)  09/12/15 209 lb (94.802 kg)  08/29/15 211 lb 4.8 oz (95.845 kg)  08/24/15 208 lb 3.2 oz (94.439 kg)  08/15/15 204 lb (92.534 kg)   Patient weight has remained stable  Called pt. He reports that he suffers from hypoglycemia when he exercises. He says he will eat breakfast and then go for a walk. The patient states he eats a honey bun for breakfast as his carb source. His pre-exercise BG is 110-120 mg/dl and when he gets back home they "have dropped" to 40-70mg /dl. He does not take insulin.   Educated pt that the most important thing for him is to bring a snack with him when he works out or instead of drinking water, he could drink a sports drink to keep his blood sugars from dropping low. Pt reports his MD told him to drink boost while running. Pt asked for coupons.   If this causes pt discomfort/cramps, he could try to eat more pre workout. His doctor recommend consuming a "full meal" prior to running.   If he continues to drop low, Reccommended having a higher BG before exercise Reccommended consuming 30-60 g carb or enough to raise his BG to 150 mg/dl    Pt also has cancer. Asked how he was doing with his appetite. He his appetite is hit or miss. He sometimes he has to force himself to eat. He is currently maintaining his weights  Mailed my contact info, coupons, and handouts titled "Blood Glucose Management", and "Excercise and type 2 Dm"   Burtis Junes RD, LDN Nutrition Pager: [phone] 12/02/2015 12:23 PM

## 2015-12-05 ENCOUNTER — Inpatient Hospital Stay (HOSPITAL_COMMUNITY): Payer: Commercial Managed Care - HMO

## 2015-12-05 ENCOUNTER — Encounter (HOSPITAL_BASED_OUTPATIENT_CLINIC_OR_DEPARTMENT_OTHER): Payer: Commercial Managed Care - HMO

## 2015-12-05 VITALS — BP 113/76 | HR 92 | Temp 98.0°F | Resp 20 | Wt 202.6 lb

## 2015-12-05 DIAGNOSIS — F329 Major depressive disorder, single episode, unspecified: Secondary | ICD-10-CM | POA: Diagnosis not present

## 2015-12-05 DIAGNOSIS — M1990 Unspecified osteoarthritis, unspecified site: Secondary | ICD-10-CM | POA: Diagnosis not present

## 2015-12-05 DIAGNOSIS — R6889 Other general symptoms and signs: Secondary | ICD-10-CM | POA: Diagnosis not present

## 2015-12-05 DIAGNOSIS — I10 Essential (primary) hypertension: Secondary | ICD-10-CM | POA: Diagnosis not present

## 2015-12-05 DIAGNOSIS — Z87891 Personal history of nicotine dependence: Secondary | ICD-10-CM | POA: Diagnosis not present

## 2015-12-05 DIAGNOSIS — C20 Malignant neoplasm of rectum: Secondary | ICD-10-CM | POA: Diagnosis not present

## 2015-12-05 DIAGNOSIS — Z5111 Encounter for antineoplastic chemotherapy: Secondary | ICD-10-CM | POA: Diagnosis not present

## 2015-12-05 DIAGNOSIS — Z79899 Other long term (current) drug therapy: Secondary | ICD-10-CM | POA: Diagnosis not present

## 2015-12-05 DIAGNOSIS — E7800 Pure hypercholesterolemia, unspecified: Secondary | ICD-10-CM | POA: Diagnosis not present

## 2015-12-05 DIAGNOSIS — Z800 Family history of malignant neoplasm of digestive organs: Secondary | ICD-10-CM | POA: Diagnosis not present

## 2015-12-05 DIAGNOSIS — Z510 Encounter for antineoplastic radiation therapy: Secondary | ICD-10-CM | POA: Diagnosis not present

## 2015-12-05 MED ORDER — PALONOSETRON HCL INJECTION 0.25 MG/5ML
0.2500 mg | Freq: Once | INTRAVENOUS | Status: AC
  Administered 2015-12-05: 0.25 mg via INTRAVENOUS

## 2015-12-05 MED ORDER — FLUOROURACIL CHEMO INJECTION 5 GM/100ML
225.0000 mg/m2/d | INTRAVENOUS | Status: DC
  Administered 2015-12-05: 3200 mg via INTRAVENOUS
  Filled 2015-12-05: qty 64

## 2015-12-05 MED ORDER — PALONOSETRON HCL INJECTION 0.25 MG/5ML
INTRAVENOUS | Status: AC
  Filled 2015-12-05: qty 5

## 2015-12-05 NOTE — Progress Notes (Signed)
5FU infusion pump today only. Do not need to repeat labs, labs from 11/30/15 are fine.  Patient ambulatory on discharge on to self.Continuous infusion pump intact.

## 2015-12-05 NOTE — Patient Instructions (Signed)
[facility] Discharge Instructions for Patients Receiving Chemotherapy  Today you received the following chemotherapy agents 5FU continuous infusion pump x 7 days. Dr.Penland said if you should feel like you need IV antiemetics before the end of the week you are to give [place] a call and we can get you in.  To help prevent nausea and vomiting after your treatment, we encourage you to take your nausea medication as instructed. If you develop nausea and vomiting that is not controlled by your nausea medication, call the clinic. If it is after clinic hours your family physician or the after hours number for the clinic or go to the Emergency Department. BELOW ARE SYMPTOMS THAT SHOULD BE REPORTED IMMEDIATELY:  *FEVER GREATER THAN 101.0 F  *CHILLS WITH OR WITHOUT FEVER  NAUSEA AND VOMITING THAT IS NOT CONTROLLED WITH YOUR NAUSEA MEDICATION  *UNUSUAL SHORTNESS OF BREATH  *UNUSUAL BRUISING OR BLEEDING  TENDERNESS IN MOUTH AND THROAT WITH OR WITHOUT PRESENCE OF ULCERS  *URINARY PROBLEMS  *BOWEL PROBLEMS  UNUSUAL RASH Items with * indicate a potential emergency and should be followed up as soon as possible.  Return next Monday as scheduled.  I have been informed and understand all the instructions given to me. I know to contact the clinic, my physician, or go to the Emergency Department if any problems should occur. I do not have any questions at this time, but understand that I may call the clinic during office hours or the Patient Navigator at [phone] should I have any questions or need assistance in obtaining follow up care.    __________________________________________  _____________  __________ Signature of Patient or Authorized Representative            Date                   Time    __________________________________________ Nurse's Signature

## 2015-12-06 DIAGNOSIS — Z79899 Other long term (current) drug therapy: Secondary | ICD-10-CM | POA: Diagnosis not present

## 2015-12-06 DIAGNOSIS — R6889 Other general symptoms and signs: Secondary | ICD-10-CM | POA: Diagnosis not present

## 2015-12-06 DIAGNOSIS — F329 Major depressive disorder, single episode, unspecified: Secondary | ICD-10-CM | POA: Diagnosis not present

## 2015-12-06 DIAGNOSIS — E7800 Pure hypercholesterolemia, unspecified: Secondary | ICD-10-CM | POA: Diagnosis not present

## 2015-12-06 DIAGNOSIS — C20 Malignant neoplasm of rectum: Secondary | ICD-10-CM | POA: Diagnosis not present

## 2015-12-06 DIAGNOSIS — Z800 Family history of malignant neoplasm of digestive organs: Secondary | ICD-10-CM | POA: Diagnosis not present

## 2015-12-06 DIAGNOSIS — Z510 Encounter for antineoplastic radiation therapy: Secondary | ICD-10-CM | POA: Diagnosis not present

## 2015-12-06 DIAGNOSIS — Z87891 Personal history of nicotine dependence: Secondary | ICD-10-CM | POA: Diagnosis not present

## 2015-12-06 DIAGNOSIS — I10 Essential (primary) hypertension: Secondary | ICD-10-CM | POA: Diagnosis not present

## 2015-12-06 DIAGNOSIS — M1990 Unspecified osteoarthritis, unspecified site: Secondary | ICD-10-CM | POA: Diagnosis not present

## 2015-12-07 DIAGNOSIS — R6889 Other general symptoms and signs: Secondary | ICD-10-CM | POA: Diagnosis not present

## 2015-12-07 DIAGNOSIS — Z800 Family history of malignant neoplasm of digestive organs: Secondary | ICD-10-CM | POA: Diagnosis not present

## 2015-12-07 DIAGNOSIS — M1990 Unspecified osteoarthritis, unspecified site: Secondary | ICD-10-CM | POA: Diagnosis not present

## 2015-12-07 DIAGNOSIS — Z79899 Other long term (current) drug therapy: Secondary | ICD-10-CM | POA: Diagnosis not present

## 2015-12-07 DIAGNOSIS — Z87891 Personal history of nicotine dependence: Secondary | ICD-10-CM | POA: Diagnosis not present

## 2015-12-07 DIAGNOSIS — F329 Major depressive disorder, single episode, unspecified: Secondary | ICD-10-CM | POA: Diagnosis not present

## 2015-12-07 DIAGNOSIS — C20 Malignant neoplasm of rectum: Secondary | ICD-10-CM | POA: Diagnosis not present

## 2015-12-07 DIAGNOSIS — I10 Essential (primary) hypertension: Secondary | ICD-10-CM | POA: Diagnosis not present

## 2015-12-07 DIAGNOSIS — E7800 Pure hypercholesterolemia, unspecified: Secondary | ICD-10-CM | POA: Diagnosis not present

## 2015-12-07 DIAGNOSIS — Z510 Encounter for antineoplastic radiation therapy: Secondary | ICD-10-CM | POA: Diagnosis not present

## 2015-12-09 DIAGNOSIS — Z79899 Other long term (current) drug therapy: Secondary | ICD-10-CM | POA: Diagnosis not present

## 2015-12-09 DIAGNOSIS — Z87891 Personal history of nicotine dependence: Secondary | ICD-10-CM | POA: Diagnosis not present

## 2015-12-09 DIAGNOSIS — E7800 Pure hypercholesterolemia, unspecified: Secondary | ICD-10-CM | POA: Diagnosis not present

## 2015-12-09 DIAGNOSIS — Z510 Encounter for antineoplastic radiation therapy: Secondary | ICD-10-CM | POA: Diagnosis not present

## 2015-12-09 DIAGNOSIS — F329 Major depressive disorder, single episode, unspecified: Secondary | ICD-10-CM | POA: Diagnosis not present

## 2015-12-09 DIAGNOSIS — M1990 Unspecified osteoarthritis, unspecified site: Secondary | ICD-10-CM | POA: Diagnosis not present

## 2015-12-09 DIAGNOSIS — Z800 Family history of malignant neoplasm of digestive organs: Secondary | ICD-10-CM | POA: Diagnosis not present

## 2015-12-09 DIAGNOSIS — I10 Essential (primary) hypertension: Secondary | ICD-10-CM | POA: Diagnosis not present

## 2015-12-09 DIAGNOSIS — R6889 Other general symptoms and signs: Secondary | ICD-10-CM | POA: Diagnosis not present

## 2015-12-09 DIAGNOSIS — C20 Malignant neoplasm of rectum: Secondary | ICD-10-CM | POA: Diagnosis not present

## 2015-12-10 NOTE — Progress Notes (Signed)
No show

## 2015-12-10 NOTE — Assessment & Plan Note (Deleted)
Stage IIIA adenocarcinoma of rectum (pT1N1a), S/P robotic assisted LAR surgery by Dr. [name] on [id number] with a normal preoperative CEA.  Started adjuvant therapy with FOLFOX on 08/15/2015 x 6 cycles, then 5FU CI days 1-7 with concomitant XRT beginning on 12/05/2015.  Oncology history is updated.  Pre-treatment labs today: CBC diff, CMET.  Begin cycle #2 of 5FU CI as planned today.  Return in 1 week for follow-up and further treatment.

## 2015-12-12 ENCOUNTER — Inpatient Hospital Stay (HOSPITAL_COMMUNITY): Payer: Commercial Managed Care - HMO

## 2015-12-12 ENCOUNTER — Telehealth (HOSPITAL_COMMUNITY): Payer: Self-pay | Admitting: *Deleted

## 2015-12-12 ENCOUNTER — Ambulatory Visit: Payer: Self-pay | Admitting: Licensed Clinical Social Worker

## 2015-12-12 ENCOUNTER — Ambulatory Visit (HOSPITAL_COMMUNITY): Payer: Commercial Managed Care - HMO | Admitting: Oncology

## 2015-12-12 ENCOUNTER — Other Ambulatory Visit: Payer: Self-pay

## 2015-12-12 ENCOUNTER — Other Ambulatory Visit (HOSPITAL_COMMUNITY): Payer: Self-pay | Admitting: *Deleted

## 2015-12-12 NOTE — Telephone Encounter (Signed)
..  [name] called [name] receptionist and states that he does not have transportation for the remainder of his chemotherapy visits. I attempted to call patient back to remind him that he has CI pump on and his port accessed and needs to come in today to get this removed. No answer, voicemail left.  The situation has been forwarded to Nurse navigator and financial advisor to try to work out transportation for patient

## 2015-12-12 NOTE — Telephone Encounter (Signed)
[facility] rep called me today to let me know that he has a [facility] SW. She stated that there is nothing they can do to help him financially. She said that compliance is a major issue for this patient. She stated that compliance has to be reinforced with this patient repeatedly. She also stated that he has been instructed on his forms to fill out and he just doesn't get them completed for financial help. I will forward this message to [place] for the [place]. Patient has been referred to [facility] and has utilized their facility some. I don't know of any other options to help him financially.

## 2015-12-12 NOTE — Patient Outreach (Signed)
[place] [facility]) Care Management  12/12/2015  JAM LANA 01-Jun-1961 [id number]  Inbound voice message received from [place]: contact [facility].  -would like to know if [facility] knows of any resources that can assist patient with money owed to Pain Management: Dr. Merlene Laughter prior to next MD appt of $177.76 plus a $45.00 co-pay.  Animator, Dr. Whitney Muse has agreed to cover pain medication prescriptions while patient is receiving radiation only.    [facility] SW, Allie Bossier is aware that [facility] SW assisting patient with Medicaid application however patient continues to not gather needed paperwork to go forward with application process.  Hildred Alamin states she could refer patient to the free clinic but patient is insured and may not qualify for services.    Plan:  Healthcare Barriers associated to financial issues and patient non-adherent to completing Medicaid application.  -RN CM will contact Hildred Alamin to provide update that [place] remains active with case and will forward this notification to [place] Coordination: RN CM contacted Nurse Hildred Alamin [phone] with the following update:  RN CM will refer this update to [place] CM request team assistance to provided patient with continued education on importance of compliance with completing needed paperwork to receive needed resources in care and non-adherence to completion of these documents will make him ineligible for other resources.  Advised that resources are used only for those who have no other resource options available; patient has insurance and option of Medicaid application at this time.   Epic in-basket update sent to [place], RN, BSN, [facility], [place] Management Care Management Coordinator [phone] Direct [phone] Cell [phone] Office [phone] Fax

## 2015-12-13 ENCOUNTER — Inpatient Hospital Stay (HOSPITAL_COMMUNITY): Payer: Commercial Managed Care - HMO

## 2015-12-13 DIAGNOSIS — Z0100 Encounter for examination of eyes and vision without abnormal findings: Secondary | ICD-10-CM | POA: Diagnosis not present

## 2015-12-14 ENCOUNTER — Other Ambulatory Visit: Payer: Self-pay | Admitting: Licensed Clinical Social Worker

## 2015-12-14 NOTE — Patient Outreach (Signed)
Assessment:  CSW spoke via phone with client on 12/14/15. CSW verified client identity. CSW and client spoke of client needs. Client said he is trying to attend scheduled medical appointments for client. He has his prescribed medications and is taking medications as prescribed. He said he is eating adequately and sleeping adequately. [name], [name] is also communicating via telephone with client to discuss health needs of client. Client has financial challenges and owes money currently to one [place], [place] doctor. Client may not be able to see that particular doctor again until client's bill is paid. [name] and CSW spoke on 12/14/15 about financial needs of client and client need to apply for Medicaid. Client has not been willing to gather needed financial documents to complete Medicaid application. CSW has reviewed with client on several occasions needed documents client would use for Medicaid application appointment.  CSW encouraged client again for client to collect documents needed for Medicaid application for client. CSW encouraged client to attend scheduled client medical appointments in next 30 days.   Plan: Client to continue to gather needed financial documents in next 30 days to enable him to apply for Medicaid benefit for client. Client to attend scheduled client medical appointments in next 30 days. CSW to collaborate as needed with [place], to discuss financial needs of client and client need to apply for Medicaid benefit. CSW to call client in three weeks to discuss client needs at that time. [name].[name] MSW, LCSW Licensed Clinical Social Worker [facility] Care Management [phone]

## 2015-12-15 ENCOUNTER — Encounter (HOSPITAL_BASED_OUTPATIENT_CLINIC_OR_DEPARTMENT_OTHER): Payer: Commercial Managed Care - HMO

## 2015-12-15 VITALS — BP 121/83 | HR 87 | Temp 97.9°F | Resp 18 | Wt 203.0 lb

## 2015-12-15 DIAGNOSIS — C20 Malignant neoplasm of rectum: Secondary | ICD-10-CM

## 2015-12-15 DIAGNOSIS — M1990 Unspecified osteoarthritis, unspecified site: Secondary | ICD-10-CM | POA: Diagnosis not present

## 2015-12-15 DIAGNOSIS — I10 Essential (primary) hypertension: Secondary | ICD-10-CM | POA: Diagnosis not present

## 2015-12-15 DIAGNOSIS — Z510 Encounter for antineoplastic radiation therapy: Secondary | ICD-10-CM | POA: Diagnosis not present

## 2015-12-15 DIAGNOSIS — F329 Major depressive disorder, single episode, unspecified: Secondary | ICD-10-CM | POA: Diagnosis not present

## 2015-12-15 DIAGNOSIS — Z95828 Presence of other vascular implants and grafts: Secondary | ICD-10-CM

## 2015-12-15 DIAGNOSIS — Z452 Encounter for adjustment and management of vascular access device: Secondary | ICD-10-CM

## 2015-12-15 DIAGNOSIS — Z79899 Other long term (current) drug therapy: Secondary | ICD-10-CM | POA: Diagnosis not present

## 2015-12-15 DIAGNOSIS — Z87891 Personal history of nicotine dependence: Secondary | ICD-10-CM | POA: Diagnosis not present

## 2015-12-15 DIAGNOSIS — Z800 Family history of malignant neoplasm of digestive organs: Secondary | ICD-10-CM | POA: Diagnosis not present

## 2015-12-15 DIAGNOSIS — E7800 Pure hypercholesterolemia, unspecified: Secondary | ICD-10-CM | POA: Diagnosis not present

## 2015-12-15 MED ORDER — HEPARIN SOD (PORK) LOCK FLUSH 100 UNIT/ML IV SOLN
INTRAVENOUS | Status: AC
  Filled 2015-12-15: qty 5

## 2015-12-15 MED ORDER — HEPARIN SOD (PORK) LOCK FLUSH 100 UNIT/ML IV SOLN
500.0000 [IU] | Freq: Once | INTRAVENOUS | Status: AC
  Administered 2015-12-15: 500 [IU] via INTRAVENOUS

## 2015-12-15 MED ORDER — SODIUM CHLORIDE 0.9% FLUSH
10.0000 mL | Freq: Once | INTRAVENOUS | Status: AC
  Administered 2015-12-15: 10 mL via INTRAVENOUS

## 2015-12-15 NOTE — Progress Notes (Signed)
Patient presented to clinic. Reports he had no transportation for several days and he missed coming Monday for chemo pump. Reports he has not been to radiation any this week but is planning on going today and he no longer has transportation issues and he plans to go daily for radiation Mon-Fri. Spoke with Dr.Penland. Order to disconnect pump and flush port today. No labs needed. Schedule patient to restart infusion pump on Monday. Appointment scheduled. Patient informed of plan. Disconnected pump. Flushed port per protocol and removed needle. Patient ambulatory on discharge home to self.

## 2015-12-15 NOTE — Patient Instructions (Signed)
[place] at [facility] Discharge Instructions  RECOMMENDATIONS MADE BY THE CONSULTANT AND ANY TEST RESULTS WILL BE SENT TO YOUR REFERRING PHYSICIAN.  Disconnected infusion pump. Flushed port. Return as scheduled Monday to restart chemo continuous infusion pump.  Thank you for choosing [place] at [facility] to provide your oncology and hematology care.  To afford each patient quality time with our provider, please arrive at least 15 minutes before your scheduled appointment time.   Beginning January 23rd 2017 lab work for the [facility] will be done in the  Main lab at [facility] on 1st floor. If you have a lab appointment with the [place] please come in thru the  Main Entrance and check in at the main information desk  You need to re-schedule your appointment should you arrive 10 or more minutes late.  We strive to give you quality time with our providers, and arriving late affects you and other patients whose appointments are after yours.  Also, if you no show three or more times for appointments you may be dismissed from the clinic at the providers discretion.     Again, thank you for choosing [facility].  Our hope is that these requests will decrease the amount of time that you wait before being seen by our physicians.       _____________________________________________________________  Should you have questions after your visit to [facility], please contact our office at [phone] between the hours of 8:30 a.m. and 4:30 p.m.  Voicemails left after 4:30 p.m. will not be returned until the following business day.  For prescription refill requests, have your pharmacy contact our office.

## 2015-12-16 DIAGNOSIS — C20 Malignant neoplasm of rectum: Secondary | ICD-10-CM | POA: Diagnosis not present

## 2015-12-16 DIAGNOSIS — Z5111 Encounter for antineoplastic chemotherapy: Secondary | ICD-10-CM | POA: Diagnosis not present

## 2015-12-16 DIAGNOSIS — Z800 Family history of malignant neoplasm of digestive organs: Secondary | ICD-10-CM | POA: Diagnosis not present

## 2015-12-16 DIAGNOSIS — F329 Major depressive disorder, single episode, unspecified: Secondary | ICD-10-CM | POA: Diagnosis not present

## 2015-12-16 DIAGNOSIS — Z510 Encounter for antineoplastic radiation therapy: Secondary | ICD-10-CM | POA: Diagnosis not present

## 2015-12-16 DIAGNOSIS — Z87891 Personal history of nicotine dependence: Secondary | ICD-10-CM | POA: Diagnosis not present

## 2015-12-16 DIAGNOSIS — Z79899 Other long term (current) drug therapy: Secondary | ICD-10-CM | POA: Diagnosis not present

## 2015-12-16 DIAGNOSIS — M1990 Unspecified osteoarthritis, unspecified site: Secondary | ICD-10-CM | POA: Diagnosis not present

## 2015-12-16 DIAGNOSIS — E7800 Pure hypercholesterolemia, unspecified: Secondary | ICD-10-CM | POA: Diagnosis not present

## 2015-12-16 DIAGNOSIS — I10 Essential (primary) hypertension: Secondary | ICD-10-CM | POA: Diagnosis not present

## 2015-12-19 ENCOUNTER — Inpatient Hospital Stay (HOSPITAL_COMMUNITY): Payer: Commercial Managed Care - HMO

## 2015-12-19 ENCOUNTER — Encounter (HOSPITAL_BASED_OUTPATIENT_CLINIC_OR_DEPARTMENT_OTHER): Payer: Commercial Managed Care - HMO

## 2015-12-19 VITALS — BP 136/90 | HR 87 | Temp 97.7°F | Resp 18 | Wt 199.6 lb

## 2015-12-19 DIAGNOSIS — Z5111 Encounter for antineoplastic chemotherapy: Secondary | ICD-10-CM

## 2015-12-19 DIAGNOSIS — C20 Malignant neoplasm of rectum: Secondary | ICD-10-CM

## 2015-12-19 DIAGNOSIS — M1990 Unspecified osteoarthritis, unspecified site: Secondary | ICD-10-CM | POA: Diagnosis not present

## 2015-12-19 DIAGNOSIS — Z800 Family history of malignant neoplasm of digestive organs: Secondary | ICD-10-CM | POA: Diagnosis not present

## 2015-12-19 DIAGNOSIS — Z79899 Other long term (current) drug therapy: Secondary | ICD-10-CM | POA: Diagnosis not present

## 2015-12-19 DIAGNOSIS — F329 Major depressive disorder, single episode, unspecified: Secondary | ICD-10-CM | POA: Diagnosis not present

## 2015-12-19 DIAGNOSIS — Z510 Encounter for antineoplastic radiation therapy: Secondary | ICD-10-CM | POA: Diagnosis not present

## 2015-12-19 DIAGNOSIS — E7800 Pure hypercholesterolemia, unspecified: Secondary | ICD-10-CM | POA: Diagnosis not present

## 2015-12-19 DIAGNOSIS — I10 Essential (primary) hypertension: Secondary | ICD-10-CM | POA: Diagnosis not present

## 2015-12-19 DIAGNOSIS — Z87891 Personal history of nicotine dependence: Secondary | ICD-10-CM | POA: Diagnosis not present

## 2015-12-19 LAB — COMPREHENSIVE METABOLIC PANEL
ALBUMIN: 4.2 g/dL (ref 3.5–5.0)
ALK PHOS: 80 U/L (ref 38–126)
ALT: 27 U/L (ref 17–63)
AST: 29 U/L (ref 15–41)
Anion gap: 9 (ref 5–15)
BUN: 16 mg/dL (ref 6–20)
CO2: 24 mmol/L (ref 22–32)
Calcium: 9.4 mg/dL (ref 8.9–10.3)
Chloride: 102 mmol/L (ref 101–111)
Creatinine, Ser: 0.97 mg/dL (ref 0.61–1.24)
GFR calc Af Amer: >60 mL/min (ref >60)
GFR calc non Af Amer: >60 mL/min (ref >60)
GLUCOSE: 84 mg/dL (ref 65–99)
Potassium: 4 mmol/L (ref 3.5–5.1)
SODIUM: 135 mmol/L (ref 135–145)
TOTAL PROTEIN: 7.9 g/dL (ref 6.5–8.1)
Total Bilirubin: 0.8 mg/dL (ref 0.3–1.2)

## 2015-12-19 LAB — CBC WITH DIFFERENTIAL/PLATELET
BASOS PCT: 1 %
Basophils Absolute: 0 10*3/uL (ref 0.0–0.1)
EOS ABS: 0.3 10*3/uL (ref 0.0–0.7)
Eosinophils Relative: 12 %
HCT: 39.5 % (ref 39.0–52.0)
HEMOGLOBIN: 13.3 g/dL (ref 13.0–17.0)
LYMPHS ABS: 0.8 10*3/uL (ref 0.7–4.0)
Lymphocytes Relative: 28 %
MCH: 29.9 pg (ref 26.0–34.0)
MCHC: 33.7 g/dL (ref 30.0–36.0)
MCV: 88.8 fL (ref 78.0–100.0)
Monocytes Absolute: 0.3 10*3/uL (ref 0.1–1.0)
Monocytes Relative: 9 %
NEUTROS PCT: 50 %
Neutro Abs: 1.5 10*3/uL — ABNORMAL LOW (ref 1.7–7.7)
Platelets: 118 10*3/uL — ABNORMAL LOW (ref 150–400)
RBC: 4.45 MIL/uL (ref 4.22–5.81)
RDW: 14 % (ref 11.5–15.5)
WBC: 2.9 10*3/uL — AB (ref 4.0–10.5)

## 2015-12-19 MED ORDER — PALONOSETRON HCL INJECTION 0.25 MG/5ML
0.2500 mg | Freq: Once | INTRAVENOUS | Status: AC
  Administered 2015-12-19: 0.25 mg via INTRAVENOUS
  Filled 2015-12-19: qty 5

## 2015-12-19 MED ORDER — FLUOROURACIL CHEMO INJECTION 5 GM/100ML
225.0000 mg/m2/d | INTRAVENOUS | Status: DC
  Filled 2015-12-19: qty 64

## 2015-12-19 MED ORDER — SODIUM CHLORIDE 0.9 % IV SOLN
2700.0000 mg | INTRAVENOUS | Status: DC
  Administered 2015-12-19: 2700 mg via INTRAVENOUS
  Filled 2015-12-19: qty 54

## 2015-12-19 MED ORDER — SODIUM CHLORIDE 0.9 % IJ SOLN
10.0000 mL | INTRAMUSCULAR | Status: DC | PRN
  Administered 2015-12-19: 10 mL
  Filled 2015-12-19: qty 10

## 2015-12-19 NOTE — Progress Notes (Signed)
Denies any [name] changes, issues or concerns.  Continuous infusion pump intact. Patient ambulatory on discharge home to self.

## 2015-12-19 NOTE — Patient Instructions (Signed)
[facility] Discharge Instructions for Patients Receiving Chemotherapy  Today you received the following chemotherapy agents 5FU continuous infusion pump.  To help prevent nausea and vomiting after your treatment, we encourage you to take your nausea medication as instructed. If you develop nausea and vomiting that is not controlled by your nausea medication, call the clinic. If it is after clinic hours your family physician or the after hours number for the clinic or go to the Emergency Department. BELOW ARE SYMPTOMS THAT SHOULD BE REPORTED IMMEDIATELY:  *FEVER GREATER THAN 101.0 F  *CHILLS WITH OR WITHOUT FEVER  NAUSEA AND VOMITING THAT IS NOT CONTROLLED WITH YOUR NAUSEA MEDICATION  *UNUSUAL SHORTNESS OF BREATH  *UNUSUAL BRUISING OR BLEEDING  TENDERNESS IN MOUTH AND THROAT WITH OR WITHOUT PRESENCE OF ULCERS  *URINARY PROBLEMS  *BOWEL PROBLEMS  UNUSUAL RASH Items with * indicate a potential emergency and should be followed up as soon as possible.  Return as scheduled.  I have been informed and understand all the instructions given to me. I know to contact the clinic, my physician, or go to the Emergency Department if any problems should occur. I do not have any questions at this time, but understand that I may call the clinic during office hours or the Patient Navigator at [phone] should I have any questions or need assistance in obtaining follow up care.    __________________________________________  _____________  __________ Signature of Patient or Authorized Representative            Date                   Time    __________________________________________ Nurse's Signature

## 2015-12-20 DIAGNOSIS — F329 Major depressive disorder, single episode, unspecified: Secondary | ICD-10-CM | POA: Diagnosis not present

## 2015-12-20 DIAGNOSIS — E7800 Pure hypercholesterolemia, unspecified: Secondary | ICD-10-CM | POA: Diagnosis not present

## 2015-12-20 DIAGNOSIS — Z79899 Other long term (current) drug therapy: Secondary | ICD-10-CM | POA: Diagnosis not present

## 2015-12-20 DIAGNOSIS — Z510 Encounter for antineoplastic radiation therapy: Secondary | ICD-10-CM | POA: Diagnosis not present

## 2015-12-20 DIAGNOSIS — Z800 Family history of malignant neoplasm of digestive organs: Secondary | ICD-10-CM | POA: Diagnosis not present

## 2015-12-20 DIAGNOSIS — C20 Malignant neoplasm of rectum: Secondary | ICD-10-CM | POA: Diagnosis not present

## 2015-12-20 DIAGNOSIS — I10 Essential (primary) hypertension: Secondary | ICD-10-CM | POA: Diagnosis not present

## 2015-12-20 DIAGNOSIS — M1990 Unspecified osteoarthritis, unspecified site: Secondary | ICD-10-CM | POA: Diagnosis not present

## 2015-12-20 DIAGNOSIS — Z87891 Personal history of nicotine dependence: Secondary | ICD-10-CM | POA: Diagnosis not present

## 2015-12-21 DIAGNOSIS — Z510 Encounter for antineoplastic radiation therapy: Secondary | ICD-10-CM | POA: Diagnosis not present

## 2015-12-21 DIAGNOSIS — C20 Malignant neoplasm of rectum: Secondary | ICD-10-CM | POA: Diagnosis not present

## 2015-12-22 ENCOUNTER — Other Ambulatory Visit: Payer: Self-pay

## 2015-12-22 DIAGNOSIS — Z510 Encounter for antineoplastic radiation therapy: Secondary | ICD-10-CM | POA: Diagnosis not present

## 2015-12-22 DIAGNOSIS — C20 Malignant neoplasm of rectum: Secondary | ICD-10-CM | POA: Diagnosis not present

## 2015-12-22 NOTE — Patient Outreach (Signed)
[place] [facility]) Care Management  12/22/2015  [name] 1961/09/01 [id number]  Outreach call #1 for Telephonic Monthly Assessment and case management services.  Patient not reached.  RN CM left voice message on cell #.  RN CM scheduled for next contact call within one week.    Mariann Laster, RN, BSN, [facility], CCM  [employer] Management Coordinator [phone] Direct [phone] Cell [phone] Office [phone] Fax

## 2015-12-23 DIAGNOSIS — D696 Thrombocytopenia, unspecified: Secondary | ICD-10-CM | POA: Diagnosis not present

## 2015-12-23 DIAGNOSIS — C20 Malignant neoplasm of rectum: Secondary | ICD-10-CM | POA: Diagnosis not present

## 2015-12-23 DIAGNOSIS — Z5111 Encounter for antineoplastic chemotherapy: Secondary | ICD-10-CM | POA: Diagnosis not present

## 2015-12-23 DIAGNOSIS — Z510 Encounter for antineoplastic radiation therapy: Secondary | ICD-10-CM | POA: Diagnosis not present

## 2015-12-26 ENCOUNTER — Inpatient Hospital Stay (HOSPITAL_COMMUNITY): Payer: Commercial Managed Care - HMO

## 2015-12-26 ENCOUNTER — Encounter (HOSPITAL_COMMUNITY): Payer: Commercial Managed Care - HMO | Attending: Hematology & Oncology | Admitting: Hematology & Oncology

## 2015-12-26 ENCOUNTER — Encounter (HOSPITAL_COMMUNITY): Payer: Commercial Managed Care - HMO

## 2015-12-26 ENCOUNTER — Encounter (HOSPITAL_COMMUNITY): Payer: Self-pay | Admitting: Hematology & Oncology

## 2015-12-26 VITALS — BP 120/65 | HR 86 | Temp 97.8°F | Resp 18 | Wt 200.8 lb

## 2015-12-26 DIAGNOSIS — Z510 Encounter for antineoplastic radiation therapy: Secondary | ICD-10-CM | POA: Diagnosis not present

## 2015-12-26 DIAGNOSIS — D696 Thrombocytopenia, unspecified: Secondary | ICD-10-CM

## 2015-12-26 DIAGNOSIS — C20 Malignant neoplasm of rectum: Secondary | ICD-10-CM

## 2015-12-26 DIAGNOSIS — Z5111 Encounter for antineoplastic chemotherapy: Secondary | ICD-10-CM | POA: Diagnosis not present

## 2015-12-26 LAB — CBC WITH DIFFERENTIAL/PLATELET
Basophils Absolute: 0 10*3/uL (ref 0.0–0.1)
Basophils Relative: 0 %
Eosinophils Absolute: 0.6 10*3/uL (ref 0.0–0.7)
Eosinophils Relative: 14 %
HEMATOCRIT: 39.1 % (ref 39.0–52.0)
HEMOGLOBIN: 13.2 g/dL (ref 13.0–17.0)
LYMPHS ABS: 0.6 10*3/uL — AB (ref 0.7–4.0)
Lymphocytes Relative: 16 %
MCH: 29.8 pg (ref 26.0–34.0)
MCHC: 33.8 g/dL (ref 30.0–36.0)
MCV: 88.3 fL (ref 78.0–100.0)
MONOS PCT: 12 %
Monocytes Absolute: 0.5 10*3/uL (ref 0.1–1.0)
NEUTROS ABS: 2.3 10*3/uL (ref 1.7–7.7)
NEUTROS PCT: 58 %
Platelets: 128 10*3/uL — ABNORMAL LOW (ref 150–400)
RBC: 4.43 MIL/uL (ref 4.22–5.81)
RDW: 13.8 % (ref 11.5–15.5)
WBC: 4 10*3/uL (ref 4.0–10.5)

## 2015-12-26 LAB — COMPREHENSIVE METABOLIC PANEL
ALBUMIN: 4.3 g/dL (ref 3.5–5.0)
ALT: 19 U/L (ref 17–63)
AST: 26 U/L (ref 15–41)
Alkaline Phosphatase: 83 U/L (ref 38–126)
Anion gap: 8 (ref 5–15)
BUN: 11 mg/dL (ref 6–20)
CHLORIDE: 103 mmol/L (ref 101–111)
CO2: 26 mmol/L (ref 22–32)
Calcium: 9.3 mg/dL (ref 8.9–10.3)
Creatinine, Ser: 1.13 mg/dL (ref 0.61–1.24)
Glucose, Bld: 94 mg/dL (ref 65–99)
POTASSIUM: 3.9 mmol/L (ref 3.5–5.1)
Sodium: 137 mmol/L (ref 135–145)
Total Bilirubin: 0.6 mg/dL (ref 0.3–1.2)
Total Protein: 8.3 g/dL — ABNORMAL HIGH (ref 6.5–8.1)

## 2015-12-26 MED ORDER — SODIUM CHLORIDE 0.9 % IV SOLN
2700.0000 mg | INTRAVENOUS | Status: DC
  Administered 2015-12-26: 2700 mg via INTRAVENOUS
  Filled 2015-12-26: qty 54

## 2015-12-26 MED ORDER — PALONOSETRON HCL INJECTION 0.25 MG/5ML
0.2500 mg | Freq: Once | INTRAVENOUS | Status: AC
  Administered 2015-12-26: 0.25 mg via INTRAVENOUS

## 2015-12-26 MED ORDER — PALONOSETRON HCL INJECTION 0.25 MG/5ML
INTRAVENOUS | Status: AC
  Filled 2015-12-26: qty 5

## 2015-12-26 MED ORDER — SODIUM CHLORIDE 0.9 % IV SOLN: Freq: Once | INTRAVENOUS | Status: DC

## 2015-12-26 MED ORDER — SODIUM CHLORIDE 0.9 % IV SOLN
225.0000 mg/m2/d | INTRAVENOUS | Status: DC
  Filled 2015-12-26: qty 64

## 2015-12-26 NOTE — Progress Notes (Signed)
[name] presented today for labs and to have port access removed and re-accessed.  VSS.  After MD visit and lab results were complete MD ordered the chemotherapy to be dose reduced, pharmacy aware.   Patient hooked up to home infusion pump without difficulty and is aware of schedule to return.

## 2015-12-26 NOTE — Progress Notes (Signed)
[place] at [facility] Progress Note  Patient Care Team: Rosita Fire, MD as PCP - General (Internal Medicine) Daneil Dolin, MD as Consulting Physician (Gastroenterology) Standley Brooking, RN as [place], [place] as [place] (Licensed Clinical Social Worker)  CHIEF COMPLAINTS/PURPOSE OF CONSULTATION:  Stage IIIA Rectal carcinoma Robotic assisted LAR, rigid proctoscopy with Dr. [name] 03/03/8308 Lymph nodes: number examined 14; number positive: 1 Pathologic Staging: pT1, pN1a, pMX Normal preoperative CEA Adjuvant FOLFOX Plans for concurrent XRT/5-FU  HISTORY OF PRESENTING ILLNESS:  [name] 55 y.o. male is here for follow-up of his rectal cancer. He has undergone definitive surgical therapy and did remarkably well. Unfortunately he did have one lymph node that contained metastatic disease. CT imaging prior to surgery revealed no evidence of metastatic disease. He is currently receiving infusional 5-FU and XRT.  [name] is here alone today. I have personally reviewed and discussed laboratory studies with the patient. The patient continues to receive radiation with Dr. Lisbeth Renshaw and is currently on week 4 of 6.   Reports fatigue associated with radiotherapy. Denies mouth sores or diarrhea.He is having tenesmus. He also notes that he has times where he as frequent small Bowel movements.  He denies watery stool. He denies being up at night secondary to BM's.   MEDICAL HISTORY:  Past Medical History  Diagnosis Date  . Hypertension   . High cholesterol   . Sleep apnea   . Rectal cancer ([place])   . Depression   . Arthritis   . Diabetes mellitus     "Borderline"  . GERD (gastroesophageal reflux disease)     No weakness  . Neuropathy ([place])     "back missed up"    SURGICAL HISTORY: Past Surgical History  Procedure Laterality Date  . Back surgery    . Cholecystectomy      . Colonoscopy with propofol N/A 03/31/2015    Procedure: COLONOSCOPY WITH PROPOFOL at cecum 0842; withdrawal time=25mnutes;  Surgeon: RDaneil Dolin MD;  Location: [facility];  Service: Endoscopy;  Laterality: N/A;  . Polypectomy  03/31/2015    Procedure: POLYPECTOMY;  Surgeon: RDaneil Dolin MD;  Location: [facility];  Service: Endoscopy;;  . Biopsy  03/31/2015    Procedure: BIOPSY;  Surgeon: RDaneil Dolin MD;  Location: [facility];  Service: Endoscopy;;  . Flexible sigmoidoscopy N/A 07/05/2015    Procedure: FLEXIBLE SIGMOIDOSCOPY;  Surgeon: ALeighton Ruff MD;  Location: [facility];  Service: Endoscopy;  Laterality: N/A;  with tattoo  . Xi robotic assisted lower anterior resection N/A 07/06/2015    Procedure: XI ROBOTIC ASSISTED LOWER ANTERIOR RESECTION, rigid proctoscopy;  Surgeon: ALeighton Ruff MD;  Location: [facility];  Service: General;  Laterality: N/A;  . Portacath placement N/A 08/09/2015    Procedure: INSERTION PORT-A-CATH LEFT SUBCLAVIAN;  Surgeon: ALeighton Ruff MD;  Location: [facility]  Service: General;  Laterality: N/A;    SOCIAL HISTORY: Social History   Social History  . Marital Status: Legally Separated    Spouse Name: N/A  . Number of Children: N/A  . Years of Education: N/A   Occupational History  . Not on file.   Social History Main Topics  . Smoking status: Former Smoker    Quit date: 03/23/2006  . Smokeless tobacco: Never Used  . Alcohol Use: No  . Drug Use: No  . Sexual Activity: Not Currently   Other Topics Concern  . 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 Colon cancer Neg Hx     "Not that I know of"  . Colon polyps Neg Hx     "not that I know of"  . Hypertension      "Not sure who, I don't know much about my family"  . Diabetes      "Not sure who, I don't  know much about my family"   has no family status information on file.  Mother deceased, 33, liver cancer Father living, 80 3 brothers 1 sister  ALLERGIES:  has No Known Allergies.  MEDICATIONS:  Current Outpatient Prescriptions  Medication Sig Dispense Refill  . ACCU-CHEK SMARTVIEW test strip     . amLODipine (NORVASC) 5 MG tablet Take 1 tablet (5 mg total) by mouth every morning. 30 tablet 2  . dextrose 5 % SOLN 1,000 mL with fluorouracil 5 GM/100ML SOLN Inject into the vein. To be given every 14 days x 12 cycles. To run for 46 hours. Has not started yet.    . gabapentin (NEURONTIN) 800 MG tablet Take 1 tablet (800 mg total) by mouth 3 (three) times daily. 90 tablet 2  . HYDROcodone-acetaminophen (NORCO) 7.5-325 MG tablet Take 1 tablet by mouth every 12 (twelve) hours as needed for moderate pain. 45 tablet 0  . LEUCOVORIN CALCIUM IJ Inject as directed. To be given every 14 days x 12 cycles. Has not started yet.    . lidocaine-prilocaine (EMLA) cream Apply a quarter size amount to port site 1 hour prior to chemo. Do not rub in. Cover with plastic wrap. 30 g 3  . lisinopril (PRINIVIL,ZESTRIL) 40 MG tablet Take 1 tablet (40 mg total) by mouth every morning. 30 tablet 2  . NON FORMULARY PT HAS A C-PAP MACHINE    . nortriptyline (PAMELOR) 75 MG capsule Take 1 capsule (75 mg total) by mouth at bedtime. 30 capsule 2  . tiZANidine (ZANAFLEX) 2 MG tablet Take 1 tablet (2 mg total) by mouth every 8 (eight) hours as needed for muscle spasms. 30 tablet 2  . traMADol (ULTRAM) 50 MG tablet May take 1-2 tabs every 6 hours as needed for pain 120 tablet 0  . OXALIPLATIN IV Inject into the vein. Reported on 12/26/2015    . [DISCONTINUED] prochlorperazine (COMPAZINE) 10 MG tablet Take 1 tablet (10 mg total) by mouth every 6 (six) hours as needed (Nausea or vomiting). (Patient not taking: Reported on 10/06/2015) 30 tablet 1   No current facility-administered medications for this visit.    Facility-Administered Medications Ordered in Other Visits  Medication Dose Route Frequency Provider Last Rate Last Dose  . 0.9 %  sodium chloride infusion   Intravenous Once Patrici Ranks, MD      . fluorouracil (ADRUCIL) 3,200 mg in sodium chloride 0.9 % 86 mL chemo infusion  225 mg/m2/day (Treatment Plan Actual) Intravenous 7 days Patrici Ranks, MD      . palonosetron (ALOXI) injection 0.25 mg  0.25 mg Intravenous Once Patrici Ranks, MD        Review of Systems  Constitutional: Positive for malaise/fatigue. Negative for fever, chills, weight loss.  Fatigue associated with radiotherapy. HENT: Negative for congestion, hearing loss, nosebleeds, sore throat and tinnitus.   Eyes: Negative for blurred vision, double  vision, pain and discharge.  Respiratory: Negative for cough, hemoptysis, sputum production, shortness of breath and wheezing.   Cardiovascular: Negative for chest pain, palpitations, claudication, leg swelling and PND.  Gastrointestinal: Positive for rectal irritation  Negative for heartburn, nausea, vomiting, abdominal pain, diarrhea, constipation, blood in stool and melena.  Rectal and bowel symptoms associated with radiotherapy. Genitourinary: Negative for dysuria, urgency, frequency and hematuria.  Musculoskeletal: Negative for myalgias, joint pain and falls.  Skin: Negative for itching and rash.  Neurological: Negative for dizziness, tingling, tremors, sensory change, speech change, focal weakness, seizures, loss of consciousness, weakness and headaches.  Endo/Heme/Allergies: Does not bruise/bleed easily.  Psychiatric/Behavioral: Negative for depression, suicidal ideas, memory loss and substance abuse. The patient is not nervous/anxious and does not have insomnia.   All other systems reviewed and are negative.  14 point ROS was done and is otherwise as detailed above or in HPI   PHYSICAL EXAMINATION: ECOG PERFORMANCE STATUS: 0 - Asymptomatic  Vitals with BMI  12/26/2015  Height   Weight 200 lbs 13 oz  BMI   Systolic 161  Diastolic 65  Pulse 86  Respirations 18   Physical Exam  Constitutional: He is oriented to person, place, and time and well-developed, well-nourished, and in no distress.  HENT:  Head: Normocephalic and atraumatic.  Nose: Nose normal.  Mouth/Throat: Oropharynx is clear and moist. No oropharyngeal exudate.  Eyes: Conjunctivae and EOM are normal. Pupils are equal, round, and reactive to light. Right eye exhibits no discharge. Left eye exhibits no discharge. No scleral icterus.  Neck: Normal range of motion. Neck supple. No tracheal deviation present. No thyromegaly present.  Cardiovascular: Normal rate, regular rhythm and normal heart sounds.  Exam reveals no gallop and no friction rub.   No murmur heard. Pulmonary/Chest: Effort normal and breath sounds normal. He has no wheezes. He has no rales.  Abdominal: Soft. Bowel sounds are normal. He exhibits no distension and no mass. There is no tenderness. There is no rebound and no guarding.  well-healing surgical incision sites  Genitourinary:  Musculoskeletal: Normal range of motion. He exhibits no edema.  Lymphadenopathy:    He has no cervical adenopathy.  Neurological: He is alert and oriented to person, place, and time. He has normal reflexes. No cranial nerve deficit. Gait normal. Coordination normal.  Skin: Skin is warm and dry. No rash noted.  Psychiatric: Mood, memory, affect and judgment normal.  Nursing note and vitals reviewed.   LABORATORY DATA:  I have reviewed the data as listed  CBC    Component Value Date/Time   WBC 4.0 12/26/2015 0935   RBC 4.43 12/26/2015 0935   HGB 13.2 12/26/2015 0935   HCT 39.1 12/26/2015 0935   PLT 128* 12/26/2015 0935   MCV 88.3 12/26/2015 0935   MCH 29.8 12/26/2015 0935   MCHC 33.8 12/26/2015 0935   RDW 13.8 12/26/2015 0935   LYMPHSABS 0.6* 12/26/2015 0935   MONOABS 0.5 12/26/2015 0935   EOSABS 0.6 12/26/2015 0935    BASOSABS 0.0 12/26/2015 0935    CMP     Component Value Date/Time   NA 137 12/26/2015 0935   K 3.9 12/26/2015 0935   CL 103 12/26/2015 0935   CO2 26 12/26/2015 0935   GLUCOSE 94 12/26/2015 0935   BUN 11 12/26/2015 0935   CREATININE 1.13 12/26/2015 0935   CALCIUM 9.3 12/26/2015 0935   PROT 8.3* 12/26/2015 0935   ALBUMIN 4.3 12/26/2015 0935   AST 26 12/26/2015 0935   ALT 19 12/26/2015  0935   ALKPHOS 83 12/26/2015 0935   BILITOT 0.6 12/26/2015 0935   GFRNONAA >60 12/26/2015 0935   GFRAA >60 12/26/2015 0935     PATHOLOGY:REPORT OF SURGICAL PATHOLOGY ADDITIONAL INFORMATION: 1. Mismatch Repair (MMR) Protein Immunohistochemistry (IHC) IHC Expression Result: MLH1: Preserved nuclear expression (greater 50% tumor expression) MSH2: Preserved nuclear expression (greater 50% tumor expression) MSH6: Preserved nuclear expression (greater 50% tumor expression) PMS2: Preserved nuclear expression (greater 50% tumor expression) * Internal control demonstrates intact nuclear expression Interpretation: NORMAL There is preserved expression of the major and minor MMR proteins. There is a very low probability that microsatellite instability (MSI) is present. However, certain clinically significant MMR protein mutations may result in preservation of nuclear expression. It is recommended that the preservation of protein expression be correlated with molecular based MSI testing. References: 1. Guidelines on Genetic Evaluation and Management of Lynch Syndrome: A Consensus Statement by the [place] Multi-Society Task Force on Colorectal Cancer Gae Dry. Sherlie Ban , MD, and others . Am Nicki [place] 2014; [phone]; doi: 10.1038/ajg.2014.186; published online 09 June 2013 2. Outcomes of screening endometrial cancer patients for Lynch syndrome by patient-administered checklist. Olena Heckle MS, and others. Gynecol Oncol 2013;131(3):619-623. Mali RUND DO Pathologist, Electronic Signature ( Signed  07/11/2015) FINAL DIAGNOSIS Diagnosis 1. Colon, segmental resection for tumor, rectosigmoid - INVASIVE ADENOCARCINOMA, SEE COMMENT. - TUMOR INVADES INTO SUBMUCOSA. 1 of 4 FINAL for LUIGI, STUCKEY A [id number]) Diagnosis(continued) - ONE LYMPH NODE, POSITIVE FOR METASTATIC TUMOR (1/14). - SURGICAL MARGINS, NEGATIVE FOR TUMOR. - SEE TUMOR SYNOPTIC TEMPLATE BELOW. 2. Colon, resection margin (donut), final distal margin - BENIGN COLON. - NEGATIVE FOR DYSPLASIA OR MALIGNANCY. Microscopic Comment 1. COLON AND RECTUM (INCLUDING TRANS-ANAL RESECTION): Specimen: Rectosigmoid Procedure: Resection Tumor site: Mid rectum Specimen integrity: Intact Macroscopic intactness of mesorectum: Near complete Macroscopic tumor perforation: Absent Invasive tumor: Maximum size: 2.0 cm Histologic type(s): Adenocarcinoma Histologic grade and differentiation: G2: moderately differentiated/low grade Type of polyp in which invasive carcinoma arose: Adenoma with high grade dysplasia Microscopic extension of invasive tumor: Tumor extends into submucosa and is immediately adjacent to muscularis propria. Lymph-Vascular invasion: Present Peri-neural invasion: Present Tumor deposit(s) (discontinuous extramural extension): Absent Resection margins: Proximal margin: Negative Distal margin: Negative Circumferential (radial) (posterior ascending, posterior descending; lateral and posterior mid-rectum; and entire lower 1/3 rectum): Negative Mesenteric margin (sigmoid and transverse): Negative Distance closest margin (if all above margins negative): 1.0 cm (distal) Treatment effect (neo-adjuvant therapy): None Additional polyp(s): None Non-neoplastic findings: None Lymph nodes: number examined 14; number positive: 1 Pathologic Staging: pT1, pN1a, pMX Ancillary studies: Per the [place] Gastrointestinal Oncology Working group Guidelines, tumor will be submitted for both microsatellite instability by PCR and  mismatch repair protein expression by immunohistochemistry. The results will be reported in an addendum. (CR:kh 07-08-15) Mali RUND DO Pathologist, Electronic Signature (Case signed 07/08/2015) Specimen Gross and Clinical Information 2 of 4 FINAL for SUNNY, GAINS A [phone]) Specimen(s) Obtained: 1. Colon, segmental resection for tumor, rectosigmoid 2. Colon, resection margin (donut), final distal margin Specimen Clinical Information 1. rectal cancer (kp) Gross 1. Specimen: Rectosigmoid, to include at least middle third of rectum. Specimen integrity: Intact. Specimen length: 31 cm. Mesorectal intactness: Near complete. Tumor location: Middle third of rectum, posterior wall, 6.0 cm from the sigmorectal junction. Tumor size: 2.0 cm in diameter tan red indurated, sessile ill-defined mass. Percent of bowel circumference involved: Approximately 20%. Tumor distance to margins: Proximal: 28 cm. Distal: 1 cm. Mesenteric (sigmoid and transverse): 11 cm. Radial (posterior ascending, posterior descending; lateral and posterior mid-rectum;  and entire lower 1/3 rectum): 0.5 cm to the mesorectal soft tissue margin. Macroscopic extent of tumor invasion: The tumor involves, but does not grossly completely transect the muscularis. Total presumed lymph nodes: 14 possible lymph nodes ranging from 0.1 to 0.8 cm. Extramural satellite tumor nodules: None. Mucosal polyp(s): None. Additional findings: None. Block summary: A = shave of proximal margin B,C = shave of entire distal margin D-H = tumor, entirely submitted I = four nodes, whole J = four nodes, whole K = four nodes, whole L = two nodes, whole Total: 12 blocks submitted 2. Received in formalin is a 1.1 cm in diameter and 1.3 cm thick portion of tissue with tan to hyperemic, smooth mucosa on one end. Sectioned and entirely submitted in one block. (SW:ds 07/07/15) Stain(s) used in Diagnosis: The following stain(s) were used in  diagnosing the case: MSH6, MLH1, MSH2, PMS2. The control(s) stained appropriately. Disclaimer Some of these immunohistochemical stains may have been developed and the performance characteristics determined by [facility]. Some may not have been cleared or approved by the U.S. Food and Drug Administration. The FDA has determined that such clearance or approval is not necessary. This test is used for clinical purposes. It should not be regarded as investigational or for research. This laboratory is certified under the [place] (CLIA-88) as qualified to perform high complexity clinical laboratory testing. Report signed out from the following location(s) Technical Component was performed at [facility]. [address],[place] [address].KJZP:[id number]., 3 of 4 FINAL for AZREAL, STTHOMAS A [phone]) Report signed out from the following location(s)(continued) Technical Component was performed at [facility], [place], [place] [address]. CLIA #: [id number], Technical component and interpretation was performed at [address], [place], [place] [address]. CLIA #: [id number], 4 of 4   ASSESSMENT & PLAN:  T1, N1, M0 adenocarcinoma of the rectum, Stage IIIA Robotic assisted LAR with Dr. [name] Normal preoperative CEA Adjuvant FOLFOX (will also receive infusion 5-FU/XRT)  Overall he has done well with chemotherapy with principal issues revolving around thrombocytopenia.  We have eliminated his 5-FU bolus and dose reduced his 5-FU.  The patient continues to receive radiation with Dr. Lisbeth Renshaw and is currently on week 4 of 6. His next appointment with Dr. Lisbeth Renshaw is on Thursday, 12/29/15. He is on concurrent infusional 5-FU.   He will return in 1 week for follow up.Side effects and symptoms of concern were reviewed with the patient again in detail  including diarrhea, nausea, vomiting, mouth sores and fever.   All questions were answered. The patient knows to call the clinic with any problems, questions or concerns.  This document serves as a record of services personally performed by Ancil Linsey, MD. It was created on her behalf by Arlyce Harman, a trained medical scribe. The creation of this record is based on the scribe's personal observations and the provider's statements to them. This document has been checked and approved by the attending provider.  I have reviewed the above documentation for accuracy and completeness, and I agree with the above.  This note was electronically signed.  Kelby Fam. Whitney Muse, MD

## 2015-12-26 NOTE — Patient Instructions (Addendum)
[place] at [facility] Discharge Instructions  RECOMMENDATIONS MADE BY THE CONSULTANT AND ANY TEST RESULTS WILL BE SENT TO YOUR REFERRING PHYSICIAN.   Exam and discussion by Dr Whitney Muse today Your blood work looks good. If you do not have a bowel movement please call us. Return to see the doctor in 1 week. Likes having a sunburn on the inside, it is very irritating.  Please call the clinic if you have any questions or concerns     Thank you for choosing [place] at [facility] to provide your oncology and hematology care.  To afford each patient quality time with our provider, please arrive at least 15 minutes before your scheduled appointment time.   Beginning January 23rd 2017 lab work for the [facility] will be done in the  Main lab at [facility] on 1st floor. If you have a lab appointment with the [facility] please come in thru the  Main Entrance and check in at the main information desk  You need to re-schedule your appointment should you arrive 10 or more minutes late.  We strive to give you quality time with our providers, and arriving late affects you and other patients whose appointments are after yours.  Also, if you no show three or more times for appointments you may be dismissed from the clinic at the providers discretion.     Again, thank you for choosing [facility].  Our hope is that these requests will decrease the amount of time that you wait before being seen by our physicians.       _____________________________________________________________  Should you have questions after your visit to [facility], please contact our office at [phone] between the hours of 8:30 a.m. and 4:30 p.m.  Voicemails left after 4:30 p.m. will not be returned until the following business day.  For prescription refill requests, have your pharmacy contact our office.

## 2015-12-26 NOTE — Patient Instructions (Signed)
[facility] Discharge Instructions for Patients Receiving Chemotherapy   Beginning January 23rd 2017 lab work for the [facility] will be done in the  Main lab at [facility] on 1st floor. If you have a lab appointment with the [facility] please come in thru the  Main Entrance and check in at the main information desk   Today you received the following chemotherapy agent: 25fu home infusion pump.     If you develop nausea and vomiting, or diarrhea that is not controlled by your medication, call the clinic.  The clinic phone number is [phone]. Office hours are Monday-Friday 8:30am-5:00pm.  BELOW ARE SYMPTOMS THAT SHOULD BE REPORTED IMMEDIATELY:  *FEVER GREATER THAN 101.0 F  *CHILLS WITH OR WITHOUT FEVER  NAUSEA AND VOMITING THAT IS NOT CONTROLLED WITH YOUR NAUSEA MEDICATION  *UNUSUAL SHORTNESS OF BREATH  *UNUSUAL BRUISING OR BLEEDING  TENDERNESS IN MOUTH AND THROAT WITH OR WITHOUT PRESENCE OF ULCERS  *URINARY PROBLEMS  *BOWEL PROBLEMS  UNUSUAL RASH Items with * indicate a potential emergency and should be followed up as soon as possible. If you have an emergency after office hours please contact your primary care physician or go to the nearest emergency department.  Please call the clinic during office hours if you have any questions or concerns.   You may also contact the Patient Navigator at [phone] should you have any questions or need assistance in obtaining follow up care.

## 2015-12-27 DIAGNOSIS — C20 Malignant neoplasm of rectum: Secondary | ICD-10-CM | POA: Diagnosis not present

## 2015-12-27 DIAGNOSIS — Z510 Encounter for antineoplastic radiation therapy: Secondary | ICD-10-CM | POA: Diagnosis not present

## 2015-12-28 ENCOUNTER — Ambulatory Visit: Payer: Self-pay

## 2015-12-29 ENCOUNTER — Ambulatory Visit: Payer: Self-pay

## 2015-12-29 DIAGNOSIS — C20 Malignant neoplasm of rectum: Secondary | ICD-10-CM | POA: Diagnosis not present

## 2015-12-29 DIAGNOSIS — Z510 Encounter for antineoplastic radiation therapy: Secondary | ICD-10-CM | POA: Diagnosis not present

## 2015-12-30 DIAGNOSIS — Z510 Encounter for antineoplastic radiation therapy: Secondary | ICD-10-CM | POA: Diagnosis not present

## 2015-12-30 DIAGNOSIS — C20 Malignant neoplasm of rectum: Secondary | ICD-10-CM | POA: Diagnosis not present

## 2016-01-02 ENCOUNTER — Encounter (HOSPITAL_COMMUNITY): Payer: Commercial Managed Care - HMO | Admitting: Oncology

## 2016-01-02 ENCOUNTER — Encounter (HOSPITAL_BASED_OUTPATIENT_CLINIC_OR_DEPARTMENT_OTHER): Payer: Commercial Managed Care - HMO

## 2016-01-02 ENCOUNTER — Inpatient Hospital Stay (HOSPITAL_COMMUNITY): Payer: Commercial Managed Care - HMO

## 2016-01-02 VITALS — BP 119/89 | HR 90 | Temp 97.6°F | Resp 18

## 2016-01-02 DIAGNOSIS — C20 Malignant neoplasm of rectum: Secondary | ICD-10-CM

## 2016-01-02 DIAGNOSIS — Z5111 Encounter for antineoplastic chemotherapy: Secondary | ICD-10-CM | POA: Diagnosis not present

## 2016-01-02 DIAGNOSIS — Z452 Encounter for adjustment and management of vascular access device: Secondary | ICD-10-CM | POA: Diagnosis not present

## 2016-01-02 DIAGNOSIS — Z510 Encounter for antineoplastic radiation therapy: Secondary | ICD-10-CM | POA: Diagnosis not present

## 2016-01-02 LAB — CBC WITH DIFFERENTIAL/PLATELET
Basophils Absolute: 0 10*3/uL (ref 0.0–0.1)
Basophils Relative: 0 %
EOS PCT: 6 %
Eosinophils Absolute: 0.3 10*3/uL (ref 0.0–0.7)
HEMATOCRIT: 37 % — AB (ref 39.0–52.0)
Hemoglobin: 12.5 g/dL — ABNORMAL LOW (ref 13.0–17.0)
LYMPHS ABS: 0.6 10*3/uL — AB (ref 0.7–4.0)
LYMPHS PCT: 13 %
MCH: 30 pg (ref 26.0–34.0)
MCHC: 33.8 g/dL (ref 30.0–36.0)
MCV: 88.9 fL (ref 78.0–100.0)
MONO ABS: 0.6 10*3/uL (ref 0.1–1.0)
Monocytes Relative: 15 %
NEUTROS ABS: 2.8 10*3/uL (ref 1.7–7.7)
Neutrophils Relative %: 66 %
PLATELETS: 162 10*3/uL (ref 150–400)
RBC: 4.16 MIL/uL — AB (ref 4.22–5.81)
RDW: 13.9 % (ref 11.5–15.5)
WBC: 4.3 10*3/uL (ref 4.0–10.5)

## 2016-01-02 LAB — COMPREHENSIVE METABOLIC PANEL
ALT: 22 U/L (ref 17–63)
AST: 28 U/L (ref 15–41)
Albumin: 4.1 g/dL (ref 3.5–5.0)
Alkaline Phosphatase: 75 U/L (ref 38–126)
Anion gap: 7 (ref 5–15)
BILIRUBIN TOTAL: 0.6 mg/dL (ref 0.3–1.2)
BUN: 10 mg/dL (ref 6–20)
CALCIUM: 9.4 mg/dL (ref 8.9–10.3)
CHLORIDE: 105 mmol/L (ref 101–111)
CO2: 26 mmol/L (ref 22–32)
CREATININE: 1.05 mg/dL (ref 0.61–1.24)
Glucose, Bld: 99 mg/dL (ref 65–99)
Potassium: 3.5 mmol/L (ref 3.5–5.1)
Sodium: 138 mmol/L (ref 135–145)
TOTAL PROTEIN: 7.9 g/dL (ref 6.5–8.1)

## 2016-01-02 MED ORDER — SODIUM CHLORIDE 0.9 % IV SOLN
2700.0000 mg | INTRAVENOUS | Status: DC
  Administered 2016-01-02: 2700 mg via INTRAVENOUS
  Filled 2016-01-02: qty 4

## 2016-01-02 MED ORDER — PALONOSETRON HCL INJECTION 0.25 MG/5ML
INTRAVENOUS | Status: AC
  Filled 2016-01-02: qty 5

## 2016-01-02 MED ORDER — TRAMADOL HCL 50 MG PO TABS: ORAL_TABLET | ORAL | Status: DC

## 2016-01-02 MED ORDER — HEPARIN SOD (PORK) LOCK FLUSH 100 UNIT/ML IV SOLN
INTRAVENOUS | Status: AC
  Filled 2016-01-02: qty 5

## 2016-01-02 MED ORDER — PALONOSETRON HCL INJECTION 0.25 MG/5ML
0.2500 mg | Freq: Once | INTRAVENOUS | Status: AC
  Administered 2016-01-02: 0.25 mg via INTRAVENOUS

## 2016-01-02 MED ORDER — HYDROCODONE-ACETAMINOPHEN 7.5-325 MG PO TABS: 1.0000 | ORAL_TABLET | Freq: Two times a day (BID) | ORAL | Status: DC | PRN

## 2016-01-02 MED ORDER — SODIUM CHLORIDE 0.9% FLUSH
10.0000 mL | INTRAVENOUS | Status: DC | PRN
  Administered 2016-01-02: 10 mL via INTRAVENOUS
  Filled 2016-01-02: qty 10

## 2016-01-02 NOTE — Progress Notes (Signed)
-  Patient was 40+ minutes late for his scheduled appointment.  Thus, he was not seen today.-

## 2016-01-02 NOTE — Progress Notes (Signed)
[name] presents today for lab work and refill of fluorouracil home infusion pump.  Specimen drawn for labs via port.  Lab results indicate patient is fine for treatment per Robynn Pane, PA.  Patient port re-accessed  And home infusion pump restarted.  All done without incident and patient tolerated well.  Medication refills also given today for hydrocodone and tramadol.       Pharmacy label for fluorouracil would not scan, pharmacy called and the pharmacist brought another label to the patient room, which also wouldn't scan.  The pharmacist, another RN, and I, all three checked the chemotherapy together for accuracy.

## 2016-01-02 NOTE — Patient Instructions (Signed)
[facility] Discharge Instructions for Patients Receiving Chemotherapy   Beginning January 23rd 2017 lab work for the [facility] will be done in the  Main lab at [facility] on 1st floor. If you have a lab appointment with the [facility] please come in thru the  Main Entrance and check in at the main information desk   Today you received the following chemotherapy agent: fluorouracil.     If you develop nausea and vomiting, or diarrhea that is not controlled by your medication, call the clinic.  The clinic phone number is [phone]. Office hours are Monday-Friday 8:30am-5:00pm.  BELOW ARE SYMPTOMS THAT SHOULD BE REPORTED IMMEDIATELY:  *FEVER GREATER THAN 101.0 F  *CHILLS WITH OR WITHOUT FEVER  NAUSEA AND VOMITING THAT IS NOT CONTROLLED WITH YOUR NAUSEA MEDICATION  *UNUSUAL SHORTNESS OF BREATH  *UNUSUAL BRUISING OR BLEEDING  TENDERNESS IN MOUTH AND THROAT WITH OR WITHOUT PRESENCE OF ULCERS  *URINARY PROBLEMS  *BOWEL PROBLEMS  UNUSUAL RASH Items with * indicate a potential emergency and should be followed up as soon as possible. If you have an emergency after office hours please contact your primary care physician or go to the nearest emergency department.  Please call the clinic during office hours if you have any questions or concerns.   You may also contact the Patient Navigator at [phone] should you have any questions or need assistance in obtaining follow up care.

## 2016-01-02 NOTE — Assessment & Plan Note (Deleted)
Stage IIIA adenocarcinoma of rectum (pT1N1a), S/P robotic assisted LAR surgery by Dr. [name] on [id number] with a normal preoperative CEA.  Started adjuvant therapy with FOLFOX on 08/15/2015 x 6 cycles, then 5FU CI days 1-7 with concomitant XRT beginning on 12/05/2015.  Oncology history is up-to-date.  Pre-treatment labs today: CBC diff, CMET.  Begin cycle #3 of 5FU CI as planned today.  Return in 1 week for follow-up and further treatment.

## 2016-01-03 ENCOUNTER — Other Ambulatory Visit: Payer: Self-pay

## 2016-01-03 DIAGNOSIS — C20 Malignant neoplasm of rectum: Secondary | ICD-10-CM | POA: Diagnosis not present

## 2016-01-03 DIAGNOSIS — Z510 Encounter for antineoplastic radiation therapy: Secondary | ICD-10-CM | POA: Diagnosis not present

## 2016-01-03 NOTE — Patient Outreach (Addendum)
[facility]) Care Management  01/03/2016  [name] March 11, 1961 [id number]   Telephone Monthly Assessment   Referral Date:  08/17/15 Referral Source: [place] Referral Issue: Cancer Screening Date: 08/19/15 Case Management Start Date: 08/19/15  Initial Assessment Date: 09/20/2015 Program: Other (Cancer) 08/19/15 Insurance:  [place] [employer] HMO [id number]  Providers: Primary MD: Dr. Rosita Fire Oncologist: Patrici Ranks, MD and [name], PA-C  Pain Management: Dr. Merlene Laughter  Social:  Patient reached at cell phone # [phone]. Patient lives in his home; divorced and has 2 children. Patient states he lives with other family members: 6 people living in the home.  Caregiver:Good friend/ [name].  Falls: None Transportation: Owns a [employer] and still driving when gas money available.    DME: [place], CPap (2006), glucometer/Accu-chek (07/2015), eye glasses  Financial: H/o Issues with specialty co-pays $45.00 and accumulation of hospital bills. Financial Assessment completed by [facility] SW 10/21/15.  Medicaid application. H/o patient has still not collected documents needed to apply for Medicaid. Patient states he has not completed this task due to having so much fatigue following Chemo and radiation. States he is receiving many bills for various locations.  RN CM discussed openly with patient this call.  Patient states he is very scattered and has papers everywhere.   RN CM advised patient to keep all health information in a folder to keep in a central location; advised that others can assist patient to locate items from the folder if he at least has them gathered in a central location.   RN CM advised patient to utilize his friend to assist with locating the additional documents requested by the Medicaid office.  Advised that Medicaid application and eligibility is dependent on providing the requested documents to [facility] Medicaid  office to determine eligibility.  Resources:  -Physicist, medical -SSD (2006) due to back and left leg issues.  -Medicare "Extra Help" -[place] for supportive counseling and gas card resource.  Advanced Directives: None. Patient interested in completing and confirms he has the [facility] Advanced Directive document. Patient elects his ex-wife, [name] to be placed on his Advanced Directive. H/o [facility] SW addressed on SW contact: patient to read/review advanced directives booklet and communicate, as needed, with CSW about advanced directives preparation. RN CM reviewed again this call 01/03/2016.  Patient states he still has document but has not completed.  RN CM encouraged patient to complete document.   Cancer:  Stage 3 Rectal Cancer Chemo: yes.  Home Infusion Pump and Portacath management  Chemo to start 11/30/2015 ([employer] providing transportation services).  Radiation to start 12/02/2015 (patient has not arranged transportation services yet; states he has the number at home and will call to discuss options. Meta Hatchet, LCSW at [facility] assisting with transportation.)  HTN (2010) Patient is taking BP medication everyday.   Diabetes (2010) Patient is taking no medication to manage blood sugar. BS readings: Checks 1-2 times a day.  A1c: 6.3 on 06/30/15   Medications Taking less than 10 medications Most have no co-pay;active with Medicare "Extra Help".  Using [facility] mail order.  No medication issues identified this call.   Pain Management  Patient has not been able to pay outstanding balance owed to Dr. [name] office in order to allow him to schedule an appt.   H/o Oncologist:  Kelby Fam. Penland, MD providing coverage for medication orders given patients current cancer related expenses.  [place] completed 10/06/15.  Update 01/03/2016:  Patient states he has not seen Dr. Sheldon Silvan due to the  inability to pay this bill. States Oncologist is overseeing pain management meds during this time.   PLAN RN CM will continue to assess for resolution to the following: -Appt compliance -Medicaid application - pending patient gathering documents [facility] SW remains active).  -Transportation - current resource in place  -Advanced Directive: pending patient reading and preparing document for completion.   RN CM advised in next follow-call within 30 days for Monthly Telephone Assessment  RN CM advised to please notify MD of any changes in condition prior to scheduled appt's.  RN CM provided contact name and #, 24-hour nurse line # [phone].  RN CM confirmed patient is aware of 911 services for urgent emergency needs.Margie Billet CM Care Plan Problem One        Most Recent Value   Care Plan Problem One  High Risk for admission due to multiple co-morbidities.    Role Documenting the Problem One  Care Management Telephonic [place] for Problem One  Active   [facility] Long Term Goal (31-90 days)  Patient will report any change in condition to appropriate MDs to avoid admissions over the next 90 days.    [facility]N Long Term Goal Start Date  09/20/15   [facility]N Long Term Goal Met Date  -- [Goal continued]   Interventions for Problem One Long Term Goal  RN CM will provide education on symptoms to report to MD as relates to conditions over the next 90 days.    THN CM Short Term Goal #1 (0-30 days)  Patient will remain compliant with all appt's to avoid use of ED services within the next 30 days.    THN CM Short Term Goal #1 Start Date  11/24/15   [facility] CM Short Term Goal #1 Met Date  -- [Goal continued:  no new admissions. ]   Interventions for Short Term Goal #1  RN CM will provide education and support on importance of keeping all MD appts to manage any change in condition over the next 30 days.     [facility] CM Care Plan Problem Two        Most Recent Value   Care Plan Problem Two  Transportation  barriers    Role Documenting the Problem Two  Care Management Telephonic Coordinator   Care Plan for Problem Two  Active   THN CM Short Term Goal #1 (0-30 days)  Patient will notify transporation resource services of needs over the next 30 days.    THN CM Short Term Goal #1 Start Date  11/24/15   [facility] CM Short Term Goal #1 Met Date   -- [Goal continued. patient has utilized [employer] ]   Interventions for Short Term Goal #2   RN CM will provide patient self management interventions to self manage his transportation needs within the next 30 days.     [facility] CM Care Plan Problem Three        Most Recent Value   Care Plan Problem Three  Financial Barriers associated to healthcare cost.   Role Documenting the Problem Three  Care Management Telephonic Coordinator   Care Plan for Problem Three  Active   THN CM Short Term Goal #1 (0-30 days)  Patient will gather requested documents requested by Medicaid office over the next 30 days.    THN CM Short Term Goal #1 Start Date  09/20/15   [facility]  CM Short Term Goal #1 Met Date  -- [Goal continued.  patient in process of gathering needed docu]   Interventions for Short Term Goal #1  RN CM provided educationa and reasoning why patient needs to be compliant with requested documents over the next 30 days.       Quarterly update routed to primary MD: Dr. Legrand Rams.   Mariann Laster, RN, BSN, [facility], CCM  [employer] Management Coordinator [phone] Direct [phone] Cell [phone] Office [phone] Fax

## 2016-01-04 ENCOUNTER — Other Ambulatory Visit: Payer: Self-pay | Admitting: Licensed Clinical Social Worker

## 2016-01-04 DIAGNOSIS — C20 Malignant neoplasm of rectum: Secondary | ICD-10-CM | POA: Diagnosis not present

## 2016-01-04 DIAGNOSIS — Z510 Encounter for antineoplastic radiation therapy: Secondary | ICD-10-CM | POA: Diagnosis not present

## 2016-01-04 NOTE — Patient Outreach (Signed)
Assessment: CSW called client on 01/04/16 and spoke via phone with client.  CSW verified client identity. CSW and client spoke of client needs.CSW and client spoke of client care plan. Client is still working on collecting financial documents to apply for Medicaid.  CSW encouraged that client and son of client work together to collect financial documents needed by client to apply for [facility] Client said once he has found these needed documents he will go with his son to [place] in [place], [place] to apply for  Medicaid for client. Client said he is attending medical appointments as scheduled. He said he plans to see Dr. Legrand Rams for medical appointment in March of 2017.  He said he is having radiation treatments as scheduled. CSW encouraged client to call CSW at [phone] as needed to discuss social work needs of client.  Plan: Client to collect needed financial documents of client in next 30 days to allow him to go to [place] in [place], [place] to apply for Medicaid. CSW to call client in 4 weeks.  [name].Aza Dantes MSW, LCSW Licensed Clinical Social Worker [facility] Care Management [phone]

## 2016-01-05 DIAGNOSIS — Z510 Encounter for antineoplastic radiation therapy: Secondary | ICD-10-CM | POA: Diagnosis not present

## 2016-01-05 DIAGNOSIS — C20 Malignant neoplasm of rectum: Secondary | ICD-10-CM | POA: Diagnosis not present

## 2016-01-06 DIAGNOSIS — Z510 Encounter for antineoplastic radiation therapy: Secondary | ICD-10-CM | POA: Diagnosis not present

## 2016-01-06 DIAGNOSIS — C20 Malignant neoplasm of rectum: Secondary | ICD-10-CM | POA: Diagnosis not present

## 2016-01-09 ENCOUNTER — Encounter (HOSPITAL_COMMUNITY): Payer: Self-pay | Admitting: Hematology & Oncology

## 2016-01-09 ENCOUNTER — Encounter (HOSPITAL_BASED_OUTPATIENT_CLINIC_OR_DEPARTMENT_OTHER): Payer: Commercial Managed Care - HMO | Admitting: Hematology & Oncology

## 2016-01-09 ENCOUNTER — Encounter (HOSPITAL_BASED_OUTPATIENT_CLINIC_OR_DEPARTMENT_OTHER): Payer: Commercial Managed Care - HMO

## 2016-01-09 ENCOUNTER — Inpatient Hospital Stay (HOSPITAL_COMMUNITY): Payer: Commercial Managed Care - HMO

## 2016-01-09 ENCOUNTER — Encounter (HOSPITAL_COMMUNITY): Payer: Self-pay

## 2016-01-09 VITALS — BP 123/84 | HR 85 | Temp 97.7°F | Resp 20 | Wt 204.0 lb

## 2016-01-09 DIAGNOSIS — Z5111 Encounter for antineoplastic chemotherapy: Secondary | ICD-10-CM | POA: Diagnosis not present

## 2016-01-09 DIAGNOSIS — C20 Malignant neoplasm of rectum: Secondary | ICD-10-CM

## 2016-01-09 DIAGNOSIS — Z510 Encounter for antineoplastic radiation therapy: Secondary | ICD-10-CM | POA: Diagnosis not present

## 2016-01-09 DIAGNOSIS — Z95828 Presence of other vascular implants and grafts: Secondary | ICD-10-CM

## 2016-01-09 LAB — COMPREHENSIVE METABOLIC PANEL
ALBUMIN: 4.1 g/dL (ref 3.5–5.0)
ALK PHOS: 70 U/L (ref 38–126)
ALT: 20 U/L (ref 17–63)
ANION GAP: 8 (ref 5–15)
AST: 26 U/L (ref 15–41)
BILIRUBIN TOTAL: 0.5 mg/dL (ref 0.3–1.2)
BUN: 12 mg/dL (ref 6–20)
CALCIUM: 9.4 mg/dL (ref 8.9–10.3)
CO2: 27 mmol/L (ref 22–32)
Chloride: 104 mmol/L (ref 101–111)
Creatinine, Ser: 1.04 mg/dL (ref 0.61–1.24)
GFR calc Af Amer: >60 mL/min (ref >60)
GLUCOSE: 92 mg/dL (ref 65–99)
Potassium: 4 mmol/L (ref 3.5–5.1)
Sodium: 139 mmol/L (ref 135–145)
TOTAL PROTEIN: 8 g/dL (ref 6.5–8.1)

## 2016-01-09 LAB — CBC WITH DIFFERENTIAL/PLATELET
BASOS PCT: 0 %
Basophils Absolute: 0 10*3/uL (ref 0.0–0.1)
Eosinophils Absolute: 0.5 10*3/uL (ref 0.0–0.7)
Eosinophils Relative: 12 %
HEMATOCRIT: 35.9 % — AB (ref 39.0–52.0)
HEMOGLOBIN: 12 g/dL — AB (ref 13.0–17.0)
Lymphocytes Relative: 12 %
Lymphs Abs: 0.5 10*3/uL — ABNORMAL LOW (ref 0.7–4.0)
MCH: 30.1 pg (ref 26.0–34.0)
MCHC: 33.4 g/dL (ref 30.0–36.0)
MCV: 90 fL (ref 78.0–100.0)
MONOS PCT: 15 %
Monocytes Absolute: 0.6 10*3/uL (ref 0.1–1.0)
NEUTROS ABS: 2.6 10*3/uL (ref 1.7–7.7)
NEUTROS PCT: 61 %
Platelets: 178 10*3/uL (ref 150–400)
RBC: 3.99 MIL/uL — ABNORMAL LOW (ref 4.22–5.81)
RDW: 14 % (ref 11.5–15.5)
WBC: 4.2 10*3/uL (ref 4.0–10.5)

## 2016-01-09 MED ORDER — PALONOSETRON HCL INJECTION 0.25 MG/5ML
0.2500 mg | Freq: Once | INTRAVENOUS | Status: AC
  Administered 2016-01-09: 0.25 mg via INTRAVENOUS

## 2016-01-09 MED ORDER — PALONOSETRON HCL INJECTION 0.25 MG/5ML
INTRAVENOUS | Status: AC
  Filled 2016-01-09: qty 5

## 2016-01-09 MED ORDER — SODIUM CHLORIDE 0.9 % IV SOLN
2700.0000 mg | INTRAVENOUS | Status: DC
  Administered 2016-01-09: 2700 mg via INTRAVENOUS
  Filled 2016-01-09: qty 50

## 2016-01-09 MED ORDER — SODIUM CHLORIDE 0.9 % IJ SOLN: 10.0000 mL | INTRAMUSCULAR | Status: DC | PRN

## 2016-01-09 NOTE — Progress Notes (Signed)
Patient tolerated lab draw and chemotherapy pump initiation well.  Patient washed port dressing site with soap and water after removal of port needle upon arrival.  Re-accessed per protocol and pump.

## 2016-01-09 NOTE — Patient Instructions (Signed)
[facility] Discharge Instructions for Patients Receiving Chemotherapy   Beginning January 23rd 2017 lab work for the [facility] will be done in the  Main lab at [facility] on 1st floor. If you have a lab appointment with the [place] please come in thru the  Main Entrance and check in at the main information desk   Today you received the following chemotherapy agent: fluorouracil.     If you develop nausea and vomiting, or diarrhea that is not controlled by your medication, call the clinic.  The clinic phone number is [phone]. Office hours are Monday-Friday 8:30am-5:00pm.  BELOW ARE SYMPTOMS THAT SHOULD BE REPORTED IMMEDIATELY:  *FEVER GREATER THAN 101.0 F  *CHILLS WITH OR WITHOUT FEVER  NAUSEA AND VOMITING THAT IS NOT CONTROLLED WITH YOUR NAUSEA MEDICATION  *UNUSUAL SHORTNESS OF BREATH  *UNUSUAL BRUISING OR BLEEDING  TENDERNESS IN MOUTH AND THROAT WITH OR WITHOUT PRESENCE OF ULCERS  *URINARY PROBLEMS  *BOWEL PROBLEMS  UNUSUAL RASH Items with * indicate a potential emergency and should be followed up as soon as possible. If you have an emergency after office hours please contact your primary care physician or go to the nearest emergency department.  Please call the clinic during office hours if you have any questions or concerns.   You may also contact the Patient Navigator at [phone] should you have any questions or need assistance in obtaining follow up care.

## 2016-01-09 NOTE — Patient Instructions (Addendum)
[place] at [facility] Discharge Instructions  RECOMMENDATIONS MADE BY THE CONSULTANT AND ANY TEST RESULTS WILL BE SENT TO YOUR REFERRING PHYSICIAN.   Exam and discussion by Dr Whitney Muse today Miralax and colace are both stool softeners  Lab work today Return to see the doctor in 2 weeks  Please call the clinic if you have any questions or concerns    Thank you for choosing [place] at [facility] to provide your oncology and hematology care.  To afford each patient quality time with our provider, please arrive at least 15 minutes before your scheduled appointment time.   Beginning January 23rd 2017 lab work for the [facility] will be done in the  Main lab at [facility] on 1st floor. If you have a lab appointment with the [place] please come in thru the  Main Entrance and check in at the main information desk  You need to re-schedule your appointment should you arrive 10 or more minutes late.  We strive to give you quality time with our providers, and arriving late affects you and other patients whose appointments are after yours.  Also, if you no show three or more times for appointments you may be dismissed from the clinic at the providers discretion.     Again, thank you for choosing [facility].  Our hope is that these requests will decrease the amount of time that you wait before being seen by our physicians.       _____________________________________________________________  Should you have questions after your visit to [facility], please contact our office at [phone] between the hours of 8:30 a.m. and 4:30 p.m.  Voicemails left after 4:30 p.m. will not be returned until the following business day.  For prescription refill requests, have your pharmacy contact our office.

## 2016-01-09 NOTE — Progress Notes (Signed)
[place] [facility] Progress Note  Patient Care Team: Rosita Fire, MD as PCP - General (Internal Medicine) Daneil Dolin, MD as Consulting Physician (Gastroenterology) Standley Brooking, RN as [place], [place] as [place] (Licensed Clinical Social Worker)  CHIEF COMPLAINTS/PURPOSE OF CONSULTATION:  Stage IIIA Rectal carcinoma Robotic assisted LAR, rigid proctoscopy with Dr. [name] 06/03/9677 Lymph nodes: number examined 14; number positive: 1 Pathologic Staging: pT1, pN1a, pMX Normal preoperative CEA Adjuvant FOLFOX Plans for concurrent XRT/5-FU  HISTORY OF PRESENTING ILLNESS:  [name] 55 y.o. male is here for follow-up of his rectal cancer. He has undergone definitive surgical therapy and did remarkably well. Unfortunately he did have one lymph node that contained metastatic disease. CT imaging prior to surgery revealed no evidence of metastatic disease. He is currently receiving infusional 5-FU and XRT.  [name] is alone and here to receive cycle #5 5-FU. He is receiving chemotherapy treatment during our visit. The patient continues to receive radiation with Dr. Lisbeth Renshaw.  Reports he often has hard stool that is difficult to pass and is why he has been taking colace and miralax. He has not taken imodium in about one week. He has been experiencing some blood in stool while straining.   Denies breathing issues. He is eating all right. He has been experiencing rectal pain associated with radiotherapy.  He does not need any refills at this time.    MEDICAL HISTORY:  Past Medical History  Diagnosis Date  . Hypertension   . High cholesterol   . Sleep apnea   . Rectal cancer ([place])   . Depression   . Arthritis   . Diabetes mellitus     "Borderline"  . GERD (gastroesophageal reflux disease)     No weakness  . Neuropathy ([place])     "back missed up"  . Cancer  [facility])     rectal    SURGICAL HISTORY: Past Surgical History  Procedure Laterality Date  . Back surgery    . Cholecystectomy    . Colonoscopy with propofol N/[name] 03/31/2015    Procedure: COLONOSCOPY WITH PROPOFOL at cecum 0842; withdrawal time=5mnutes;  Surgeon: RDaneil Dolin MD;  Location: [facility];  Service: Endoscopy;  Laterality: N/[name];  . Polypectomy  03/31/2015    Procedure: POLYPECTOMY;  Surgeon: RDaneil Dolin MD;  Location: [facility];  Service: Endoscopy;;  . Biopsy  03/31/2015    Procedure: BIOPSY;  Surgeon: RDaneil Dolin MD;  Location: [facility];  Service: Endoscopy;;  . Flexible sigmoidoscopy N/[name] 07/05/2015    Procedure: FLEXIBLE SIGMOIDOSCOPY;  Surgeon: ALeighton Ruff MD;  Location: [facility];  Service: Endoscopy;  Laterality: N/[name];  with tattoo  . Xi robotic assisted lower anterior resection N/[name] 07/06/2015    Procedure: XI ROBOTIC ASSISTED LOWER ANTERIOR RESECTION, rigid proctoscopy;  Surgeon: ALeighton Ruff MD;  Location: [facility];  Service: General;  Laterality: N/[name];  . Portacath placement N/[name] 08/09/2015    Procedure: INSERTION PORT-[name]-CATH LEFT SUBCLAVIAN;  Surgeon: ALeighton Ruff MD;  Location: [facility]  Service: General;  Laterality: N/[name];    SOCIAL HISTORY: Social History   Social History  . Marital Status: Legally Separated    Spouse Name: N/[name]  . Number of Children: N/[name]  . Years of Education: N/[name]   Occupational History  . Not on file.   Social History Main Topics  . Smoking status: Former Smoker    Quit date: 03/23/2006  .  Smokeless tobacco: Never Used  . Alcohol Use: No  . Drug Use: No  . Sexual Activity: Not Currently   Other Topics Concern  . 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 Colon cancer Neg Hx     "Not that I know of"  . Colon polyps Neg Hx      "not that I know of"  . Hypertension      "Not sure who, I don't know much about my family"  . Diabetes      "Not sure who, I don't know much about my family"   has no family status information on file.  Mother deceased, 63, liver cancer Father living, 65 3 brothers 1 sister  ALLERGIES:  has No Known Allergies.  MEDICATIONS:  Current Outpatient Prescriptions  Medication Sig Dispense Refill  . ACCU-CHEK SMARTVIEW test strip     . amLODipine (NORVASC) 5 MG tablet Take 1 tablet (5 mg total) by mouth every morning. 30 tablet 2  . dextrose 5 % SOLN 1,000 mL with fluorouracil 5 GM/100ML SOLN Inject into the vein. To be given every 14 days x 12 cycles. To run for 46 hours. Has not started yet.    . docusate sodium (COLACE) 100 MG capsule Take 100 mg by mouth daily.    Marland Kitchen gabapentin (NEURONTIN) 800 MG tablet Take 1 tablet (800 mg total) by mouth 3 (three) times daily. 90 tablet 2  . HYDROcodone-acetaminophen (NORCO) 7.5-325 MG tablet Take 1 tablet by mouth every 12 (twelve) hours as needed for moderate pain. 45 tablet 0  . LEUCOVORIN CALCIUM IJ Inject as directed. To be given every 14 days x 12 cycles. Has not started yet.    . lidocaine-prilocaine (EMLA) cream Apply [name] quarter size amount to port site 1 hour prior to chemo. Do not rub in. Cover with plastic wrap. 30 g 3  . lisinopril (PRINIVIL,ZESTRIL) 40 MG tablet Take 1 tablet (40 mg total) by mouth every morning. 30 tablet 2  . loperamide (IMODIUM [name]-D) 2 MG tablet Take 2 mg by mouth as needed for diarrhea or loose stools.    . NON FORMULARY PT HAS [name] C-PAP MACHINE    . nortriptyline (PAMELOR) 75 MG capsule Take 1 capsule (75 mg total) by mouth at bedtime. 30 capsule 2  . OXALIPLATIN IV Inject into the vein. Reported on 12/26/2015    . polyethylene glycol powder (GLYCOLAX/MIRALAX) powder Take 1 Container by mouth once.    Marland Kitchen tiZANidine (ZANAFLEX) 2 MG tablet Take 1 tablet (2 mg total) by mouth every 8 (eight) hours as needed for muscle spasms. 30  tablet 2  . traMADol (ULTRAM) 50 MG tablet May take 1-2 tabs every 6 hours as needed for pain 120 tablet 0  . [DISCONTINUED] prochlorperazine (COMPAZINE) 10 MG tablet Take 1 tablet (10 mg total) by mouth every 6 (six) hours as needed (Nausea or vomiting). (Patient not taking: Reported on 10/06/2015) 30 tablet 1   No current facility-administered medications for this visit.   Facility-Administered Medications Ordered in Other Visits  Medication Dose Route Frequency Provider Last Rate Last Dose  . fluorouracil (ADRUCIL) 2,700 mg in sodium chloride 0.9 % 96 mL chemo infusion  2,700 mg Intravenous 7 days Patrici Ranks, MD   2,700 mg at 01/09/16 1308  .  sodium chloride 0.9 % injection 10 mL  10 mL Intracatheter PRN Patrici Ranks, MD        Review of Systems  Constitutional: Negative for fever, chills, weight loss.  In treatment chair. HENT: Negative for congestion, hearing loss, nosebleeds, sore throat and tinnitus.   Eyes: Negative for blurred vision, double vision, pain and discharge.  Respiratory: Negative for cough, hemoptysis, sputum production, shortness of breath and wheezing.   Cardiovascular: Negative for chest pain, palpitations, claudication, leg swelling and PND.  Gastrointestinal: Positive for rectal irritation, constipation, and blood in stool.  Negative for heartburn, nausea, vomiting, abdominal pain, diarrhea, and melena.  Rectal and bowel symptoms associated with radiotherapy. Genitourinary: Negative for dysuria, urgency, frequency and hematuria.  Musculoskeletal: Negative for myalgias, joint pain and falls.  Skin: Negative for itching and rash.  Neurological: Negative for dizziness, tingling, tremors, sensory change, speech change, focal weakness, seizures, loss of consciousness, weakness and headaches.  Endo/Heme/Allergies: Does not bruise/bleed easily.  Psychiatric/Behavioral: Negative for depression, suicidal ideas, memory loss and substance abuse. The patient is not  nervous/anxious and does not have insomnia.   All other systems reviewed and are negative.  14 point ROS was done and is otherwise as detailed above or in HPI   PHYSICAL EXAMINATION: Vitals with BMI 01/09/2016  Height   Weight 204 lbs  BMI   Systolic 885  Diastolic 84  Pulse 85  Respirations 20   Physical Exam  Constitutional: He is oriented to person, place, and time and well-developed, well-nourished, and in no distress.  HENT:  Head: Normocephalic and atraumatic.  Nose: Nose normal.  Mouth/Throat: Oropharynx is clear and moist. No oropharyngeal exudate.  Eyes: Conjunctivae and EOM are normal. Pupils are equal, round, and reactive to light. Right eye exhibits no discharge. Left eye exhibits no discharge. No scleral icterus.  Neck: Normal range of motion. Neck supple. No tracheal deviation present. No thyromegaly present.  Cardiovascular: Normal rate, regular rhythm and normal heart sounds.  Exam reveals no gallop and no friction rub.   No murmur heard. Pulmonary/Chest: Effort normal and breath sounds normal. He has no wheezes. He has no rales.  Abdominal: Soft. Bowel sounds are normal. He exhibits no distension and no mass. There is no tenderness. There is no rebound and no guarding.  well-healing surgical incision sites  Genitourinary:  Musculoskeletal: Normal range of motion. He exhibits no edema.  Lymphadenopathy:    He has no cervical adenopathy.  Neurological: He is alert and oriented to person, place, and time. He has normal reflexes. No cranial nerve deficit. Gait normal. Coordination normal.  Skin: Skin is warm and dry. No rash noted.  Psychiatric: Mood, memory, affect and judgment normal.  Nursing note and vitals reviewed.   LABORATORY DATA:  I have reviewed the data as listed CBC    Component Value Date/Time   WBC 4.2 01/09/2016 1107   RBC 3.99* 01/09/2016 1107   HGB 12.0* 01/09/2016 1107   HCT 35.9* 01/09/2016 1107   PLT 178 01/09/2016 1107   MCV 90.0  01/09/2016 1107   MCH 30.1 01/09/2016 1107   MCHC 33.4 01/09/2016 1107   RDW 14.0 01/09/2016 1107   LYMPHSABS 0.5* 01/09/2016 1107   MONOABS 0.6 01/09/2016 1107   EOSABS 0.5 01/09/2016 1107   BASOSABS 0.0 01/09/2016 1107    CMP     Component Value Date/Time   NA 139 01/09/2016 1107   K 4.0 01/09/2016 1107   CL 104 01/09/2016 1107   CO2 27 01/09/2016  1107   GLUCOSE 92 01/09/2016 1107   BUN 12 01/09/2016 1107   CREATININE 1.04 01/09/2016 1107   CALCIUM 9.4 01/09/2016 1107   PROT 8.0 01/09/2016 1107   ALBUMIN 4.1 01/09/2016 1107   AST 26 01/09/2016 1107   ALT 20 01/09/2016 1107   ALKPHOS 70 01/09/2016 1107   BILITOT 0.5 01/09/2016 1107   GFRNONAA >60 01/09/2016 1107   GFRAA >60 01/09/2016 1107   PATHOLOGY:REPORT OF SURGICAL PATHOLOGY ADDITIONAL INFORMATION: 1. Mismatch Repair (MMR) Protein Immunohistochemistry (IHC) IHC Expression Result: MLH1: Preserved nuclear expression (greater 50% tumor expression) MSH2: Preserved nuclear expression (greater 50% tumor expression) MSH6: Preserved nuclear expression (greater 50% tumor expression) PMS2: Preserved nuclear expression (greater 50% tumor expression) * Internal control demonstrates intact nuclear expression Interpretation: NORMAL There is preserved expression of the major and minor MMR proteins. There is [name] very low probability that microsatellite instability (MSI) is present. However, certain clinically significant MMR protein mutations may result in preservation of nuclear expression. It is recommended that the preservation of protein expression be correlated with molecular based MSI testing. References: 1. Guidelines on Genetic Evaluation and Management of Lynch Syndrome: [name] Consensus Statement by the [place] Multi-Society Task Force on Colorectal Cancer Stana Bunting. Ileana Roup , MD, and others . Am Katheren Puller 2014; [phone]; doi: 10.1038/ajg.2014.186; published online 09 June 2013 2. Outcomes of screening endometrial  cancer patients for Lynch syndrome by patient-administered checklist. Garner Nash MS, and others. Gynecol Oncol 2013;131(3):619-623. [place] RUND DO Pathologist, Electronic Signature ( Signed 07/11/2015) FINAL DIAGNOSIS Diagnosis 1. Colon, segmental resection for tumor, rectosigmoid - INVASIVE ADENOCARCINOMA, SEE COMMENT. - TUMOR INVADES INTO SUBMUCOSA. 1 of 4 FINAL for [name], [name] [phone]) Diagnosis(continued) - ONE LYMPH NODE, POSITIVE FOR METASTATIC TUMOR (1/14). - SURGICAL MARGINS, NEGATIVE FOR TUMOR. - SEE TUMOR SYNOPTIC TEMPLATE BELOW. 2. Colon, resection margin (donut), final distal margin - BENIGN COLON. - NEGATIVE FOR DYSPLASIA OR MALIGNANCY. Microscopic Comment 1. COLON AND RECTUM (INCLUDING TRANS-ANAL RESECTION): Specimen: Rectosigmoid Procedure: Resection Tumor site: Mid rectum Specimen integrity: Intact Macroscopic intactness of mesorectum: Near complete Macroscopic tumor perforation: Absent Invasive tumor: Maximum size: 2.0 cm Histologic type(s): Adenocarcinoma Histologic grade and differentiation: G2: moderately differentiated/low grade Type of polyp in which invasive carcinoma arose: Adenoma with high grade dysplasia Microscopic extension of invasive tumor: Tumor extends into submucosa and is immediately adjacent to muscularis propria. Lymph-Vascular invasion: Present Peri-neural invasion: Present Tumor deposit(s) (discontinuous extramural extension): Absent Resection margins: Proximal margin: Negative Distal margin: Negative Circumferential (radial) (posterior ascending, posterior descending; lateral and posterior mid-rectum; and entire lower 1/3 rectum): Negative Mesenteric margin (sigmoid and transverse): Negative Distance closest margin (if all above margins negative): 1.0 cm (distal) Treatment effect (neo-adjuvant therapy): None Additional polyp(s): None Non-neoplastic findings: None Lymph nodes: number examined 14; number positive: 1 Pathologic  Staging: pT1, pN1a, pMX Ancillary studies: Per the [place] Gastrointestinal Oncology Working group Guidelines, tumor will be submitted for both microsatellite instability by PCR and mismatch repair protein expression by immunohistochemistry. The results will be reported in an addendum. (CR:kh 07-08-15) [place] RUND DO Pathologist, Electronic Signature (Case signed 07/08/2015) Specimen Gross and Clinical Information 2 of 4 FINAL for [name], [name] [phone]) Specimen(s) Obtained: 1. Colon, segmental resection for tumor, rectosigmoid 2. Colon, resection margin (donut), final distal margin Specimen Clinical Information 1. rectal cancer (kp) Gross 1. Specimen: Rectosigmoid, to include at least middle third of rectum. Specimen integrity: Intact. Specimen length: 31 cm. Mesorectal intactness: Near complete. Tumor location: Middle third of rectum, posterior wall, 6.0 cm from the  sigmorectal junction. Tumor size: 2.0 cm in diameter tan red indurated, sessile ill-defined mass. Percent of bowel circumference involved: Approximately 20%. Tumor distance to margins: Proximal: 28 cm. Distal: 1 cm. Mesenteric (sigmoid and transverse): 11 cm. Radial (posterior ascending, posterior descending; lateral and posterior mid-rectum; and entire lower 1/3 rectum): 0.5 cm to the mesorectal soft tissue margin. Macroscopic extent of tumor invasion: The tumor involves, but does not grossly completely transect the muscularis. Total presumed lymph nodes: 14 possible lymph nodes ranging from 0.1 to 0.8 cm. Extramural satellite tumor nodules: None. Mucosal polyp(s): None. Additional findings: None. Block summary: [name] = shave of proximal margin B,C = shave of entire distal margin D-H = tumor, entirely submitted I = four nodes, whole J = four nodes, whole K = four nodes, whole L = two nodes, whole Total: 12 blocks submitted 2. Received in formalin is [name] 1.1 cm in diameter and 1.3 cm thick portion of tissue  with tan to hyperemic, smooth mucosa on one end. Sectioned and entirely submitted in one block. (SW:ds 07/07/15) Stain(s) used in Diagnosis: The following stain(s) were used in diagnosing the case: MSH6, MLH1, MSH2, PMS2. The control(s) stained appropriately. Disclaimer Some of these immunohistochemical stains may have been developed and the performance characteristics determined by [facility]. Some may not have been cleared or approved by the U.S. Food and Drug Administration. The FDA has determined that such clearance or approval is not necessary. This test is used for clinical purposes. It should not be regarded as investigational or for research. This laboratory is certified under the [place] (CLIA-88) as qualified to perform high complexity clinical laboratory testing. Report signed out from the following location(s) Technical Component was performed at [facility]. [address],[place] [id number]., 3 of 4 FINAL for STERLIN, KNIGHTLY [name] [phone]) Report signed out from the following location(s)(continued) Technical Component was performed at [place], [place], [place] [address]. CLIA #: [id number], Technical component and interpretation was performed at [place] [address], [place], [place] [address]. CLIA #: [id number], 4 of 4   ASSESSMENT & PLAN:  T1, N1, M0 adenocarcinoma of the rectum, Stage IIIA Robotic assisted LAR with Dr. [name] Normal preoperative CEA Adjuvant FOLFOX (will also receive infusion 5-FU/XRT)   The patient is here to receive cycle #5 5-FU CI. The patient continues to receive radiation with Dr. Lisbeth Renshaw.  He has been experiencing rectal and bowel symptoms associated with radiotherapy. He is overall doing well however.  He will return for follow up in 1 week with his next 5-FU infusion, labs  and PE.   All questions were answered. The patient knows to call the clinic with any problems, questions or concerns.  This document serves as [name] record of services personally performed by Ancil Linsey, MD. It was created on her behalf by Arlyce Harman, [name] trained medical scribe. The creation of this record is based on the scribe's personal observations and the provider's statements to them. This document has been checked and approved by the attending provider.  I have reviewed the above documentation for accuracy and completeness, and I agree with the above.  This note was electronically signed.  Kelby Fam. Whitney Muse, MD

## 2016-01-10 DIAGNOSIS — C20 Malignant neoplasm of rectum: Secondary | ICD-10-CM | POA: Diagnosis not present

## 2016-01-10 DIAGNOSIS — Z510 Encounter for antineoplastic radiation therapy: Secondary | ICD-10-CM | POA: Diagnosis not present

## 2016-01-11 DIAGNOSIS — Z510 Encounter for antineoplastic radiation therapy: Secondary | ICD-10-CM | POA: Diagnosis not present

## 2016-01-11 DIAGNOSIS — C20 Malignant neoplasm of rectum: Secondary | ICD-10-CM | POA: Diagnosis not present

## 2016-01-12 DIAGNOSIS — C20 Malignant neoplasm of rectum: Secondary | ICD-10-CM | POA: Diagnosis not present

## 2016-01-12 DIAGNOSIS — Z510 Encounter for antineoplastic radiation therapy: Secondary | ICD-10-CM | POA: Diagnosis not present

## 2016-01-13 DIAGNOSIS — Z510 Encounter for antineoplastic radiation therapy: Secondary | ICD-10-CM | POA: Diagnosis not present

## 2016-01-13 DIAGNOSIS — C20 Malignant neoplasm of rectum: Secondary | ICD-10-CM | POA: Diagnosis not present

## 2016-01-15 DIAGNOSIS — C20 Malignant neoplasm of rectum: Secondary | ICD-10-CM | POA: Diagnosis not present

## 2016-01-16 ENCOUNTER — Encounter (HOSPITAL_BASED_OUTPATIENT_CLINIC_OR_DEPARTMENT_OTHER): Payer: Commercial Managed Care - HMO

## 2016-01-16 VITALS — BP 120/75 | HR 94 | Temp 98.2°F | Resp 18 | Wt 195.4 lb

## 2016-01-16 DIAGNOSIS — C20 Malignant neoplasm of rectum: Secondary | ICD-10-CM | POA: Diagnosis not present

## 2016-01-16 DIAGNOSIS — Z5111 Encounter for antineoplastic chemotherapy: Secondary | ICD-10-CM

## 2016-01-16 DIAGNOSIS — Z510 Encounter for antineoplastic radiation therapy: Secondary | ICD-10-CM | POA: Diagnosis not present

## 2016-01-16 LAB — COMPREHENSIVE METABOLIC PANEL
ALT: 24 U/L (ref 17–63)
AST: 31 U/L (ref 15–41)
Albumin: 4.2 g/dL (ref 3.5–5.0)
Alkaline Phosphatase: 68 U/L (ref 38–126)
Anion gap: 11 (ref 5–15)
BILIRUBIN TOTAL: 0.7 mg/dL (ref 0.3–1.2)
BUN: 14 mg/dL (ref 6–20)
CO2: 26 mmol/L (ref 22–32)
CREATININE: 0.95 mg/dL (ref 0.61–1.24)
Calcium: 9.2 mg/dL (ref 8.9–10.3)
Chloride: 103 mmol/L (ref 101–111)
GFR calc Af Amer: >60 mL/min (ref >60)
Glucose, Bld: 89 mg/dL (ref 65–99)
Potassium: 3.6 mmol/L (ref 3.5–5.1)
Sodium: 140 mmol/L (ref 135–145)
TOTAL PROTEIN: 8.1 g/dL (ref 6.5–8.1)

## 2016-01-16 LAB — CBC WITH DIFFERENTIAL/PLATELET
BASOS ABS: 0 10*3/uL (ref 0.0–0.1)
Basophils Relative: 0 %
Eosinophils Absolute: 0.3 10*3/uL (ref 0.0–0.7)
Eosinophils Relative: 7 %
HEMATOCRIT: 37.2 % — AB (ref 39.0–52.0)
HEMOGLOBIN: 12.4 g/dL — AB (ref 13.0–17.0)
LYMPHS PCT: 9 %
Lymphs Abs: 0.4 10*3/uL — ABNORMAL LOW (ref 0.7–4.0)
MCH: 30 pg (ref 26.0–34.0)
MCHC: 33.3 g/dL (ref 30.0–36.0)
MCV: 89.9 fL (ref 78.0–100.0)
Monocytes Absolute: 0.5 10*3/uL (ref 0.1–1.0)
Monocytes Relative: 11 %
NEUTROS ABS: 3.2 10*3/uL (ref 1.7–7.7)
Neutrophils Relative %: 73 %
Platelets: 154 10*3/uL (ref 150–400)
RBC: 4.14 MIL/uL — AB (ref 4.22–5.81)
RDW: 14.1 % (ref 11.5–15.5)
WBC: 4.3 10*3/uL (ref 4.0–10.5)

## 2016-01-16 MED ORDER — SODIUM CHLORIDE 0.9 % IJ SOLN: 10.0000 mL | INTRAMUSCULAR | Status: DC | PRN

## 2016-01-16 MED ORDER — FLUOROURACIL CHEMO INJECTION 5 GM/100ML
191.2500 mg/m2/d | INTRAVENOUS | Status: DC
  Administered 2016-01-16: 1150 mg via INTRAVENOUS
  Filled 2016-01-16: qty 23

## 2016-01-16 MED ORDER — PALONOSETRON HCL INJECTION 0.25 MG/5ML
0.2500 mg | Freq: Once | INTRAVENOUS | Status: AC
  Administered 2016-01-16: 0.25 mg via INTRAVENOUS

## 2016-01-16 MED ORDER — PALONOSETRON HCL INJECTION 0.25 MG/5ML
INTRAVENOUS | Status: AC
  Filled 2016-01-16: qty 5

## 2016-01-16 NOTE — Progress Notes (Signed)
Patient tolerated last 7 day infusion well.  No side effects other than diarrhea and constipation alternating which the PA and MD are aware of as a chronic issue for the patient due to treatment and disease process.  Patient has the diarrhea and constipation protocol worksheets at home for reference and is encouraged to call us if he has any questions or concerns.  Skin around the post site is irritated from constant tape and moisture from the patient bathing and sweating.  Patient encouraged to keep as dry as possible.  Area was cleaned with soap and water today after removing pump and allowed to air dry until port was re-accessed.  Patient states that it itches, but is not tender or painful.  No s/s infection.  VSS.

## 2016-01-16 NOTE — Patient Instructions (Signed)
[facility] Discharge Instructions for Patients Receiving Chemotherapy   Beginning January 23rd 2017 lab work for the [facility] will be done in the  Main lab at [facility] on 1st floor. If you have a lab appointment with the [place] please come in thru the  Main Entrance and check in at the main information desk   Today you received the following chemotherapy agent: 5FU pump refilled.  Please return Thursday when the pump is empty for removal.     If you develop nausea and vomiting, or diarrhea that is not controlled by your medication, call the clinic.  The clinic phone number is [phone]. Office hours are Monday-Friday 8:30am-5:00pm.  BELOW ARE SYMPTOMS THAT SHOULD BE REPORTED IMMEDIATELY:  *FEVER GREATER THAN 101.0 F  *CHILLS WITH OR WITHOUT FEVER  NAUSEA AND VOMITING THAT IS NOT CONTROLLED WITH YOUR NAUSEA MEDICATION  *UNUSUAL SHORTNESS OF BREATH  *UNUSUAL BRUISING OR BLEEDING  TENDERNESS IN MOUTH AND THROAT WITH OR WITHOUT PRESENCE OF ULCERS  *URINARY PROBLEMS  *BOWEL PROBLEMS  UNUSUAL RASH Items with * indicate a potential emergency and should be followed up as soon as possible. If you have an emergency after office hours please contact your primary care physician or go to the nearest emergency department.  Please call the clinic during office hours if you have any questions or concerns.   You may also contact the Patient Navigator at [phone] should you have any questions or need assistance in obtaining follow up care.

## 2016-01-17 DIAGNOSIS — C20 Malignant neoplasm of rectum: Secondary | ICD-10-CM | POA: Diagnosis not present

## 2016-01-17 DIAGNOSIS — Z510 Encounter for antineoplastic radiation therapy: Secondary | ICD-10-CM | POA: Diagnosis not present

## 2016-01-18 DIAGNOSIS — C20 Malignant neoplasm of rectum: Secondary | ICD-10-CM | POA: Diagnosis not present

## 2016-01-18 DIAGNOSIS — Z923 Personal history of irradiation: Secondary | ICD-10-CM | POA: Diagnosis not present

## 2016-01-19 ENCOUNTER — Encounter (HOSPITAL_COMMUNITY): Payer: Commercial Managed Care - HMO | Attending: Hematology & Oncology

## 2016-01-19 VITALS — BP 123/75 | HR 79 | Temp 98.0°F | Resp 18

## 2016-01-19 DIAGNOSIS — Z452 Encounter for adjustment and management of vascular access device: Secondary | ICD-10-CM

## 2016-01-19 DIAGNOSIS — C20 Malignant neoplasm of rectum: Secondary | ICD-10-CM | POA: Insufficient documentation

## 2016-01-19 MED ORDER — HEPARIN SOD (PORK) LOCK FLUSH 100 UNIT/ML IV SOLN
500.0000 [IU] | Freq: Once | INTRAVENOUS | Status: AC | PRN
  Administered 2016-01-19: 500 [IU]

## 2016-01-19 MED ORDER — HEPARIN SOD (PORK) LOCK FLUSH 100 UNIT/ML IV SOLN
INTRAVENOUS | Status: AC
  Filled 2016-01-19: qty 5

## 2016-01-19 MED ORDER — SODIUM CHLORIDE 0.9 % IJ SOLN
10.0000 mL | INTRAMUSCULAR | Status: DC | PRN
  Administered 2016-01-19: 10 mL
  Filled 2016-01-19: qty 10

## 2016-01-19 NOTE — Progress Notes (Signed)
Patient discontinued from home infusion pump.  VSS.  No side effects or concerns.  Skin surrounding port site red and irritated from constant tape.  Patient was instructed to use hydrocortisone cream on the skin to help with irritation and itching, per Dr.  Muse.  Patient to return for MD appointment and chemo as planned 01/23/2016.

## 2016-01-19 NOTE — Patient Instructions (Signed)
[place] at [facility] Discharge Instructions  RECOMMENDATIONS MADE BY THE CONSULTANT AND ANY TEST RESULTS WILL BE SENT TO YOUR REFERRING PHYSICIAN.  Home infusion pump removed and port flushed.   You may apply hydrocortisone cream to skin irritation at port site.    Thank you for choosing [place] at [facility] to provide your oncology and hematology care.  To afford each patient quality time with our provider, please arrive at least 15 minutes before your scheduled appointment time.   Beginning January 23rd 2017 lab work for the [facility] will be done in the  Main lab at [facility] on 1st floor. If you have a lab appointment with the [place] please come in thru the  Main Entrance and check in at the main information desk  You need to re-schedule your appointment should you arrive 10 or more minutes late.  We strive to give you quality time with our providers, and arriving late affects you and other patients whose appointments are after yours.  Also, if you no show three or more times for appointments you may be dismissed from the clinic at the providers discretion.     Again, thank you for choosing [facility].  Our hope is that these requests will decrease the amount of time that you wait before being seen by our physicians.       _____________________________________________________________  Should you have questions after your visit to [facility], please contact our office at [phone] between the hours of 8:30 a.m. and 4:30 p.m.  Voicemails left after 4:30 p.m. will not be returned until the following business day.  For prescription refill requests, have your pharmacy contact our office.         Resources For Cancer Patients and their Caregivers ? American Cancer Society: Can assist with transportation, wigs, general needs, runs Look Good Feel Better.        778 175 8084 ? Cancer  Care: Provides financial assistance, online support groups, medication/co-pay assistance.  1-800-813-HOPE (681) 410-8969) ? [place] Assists [place] cancer patients and their families through emotional , educational and financial support.  (706)636-0675 ? [place] DSS Where to apply for food stamps, Medicaid and utility assistance. 450-312-1651 ? [facility]: Transportation to medical appointments. 418-271-2451 ? Social Security Administration: May apply for disability if have a Stage IV cancer. 4328836044 801-045-6908 ? [employer], Disability and Transit Services: Assists with nutrition, care and transit needs. [phone]

## 2016-01-22 NOTE — Assessment & Plan Note (Addendum)
Stage IIIA invasive adenocarcinoma of rectum with initial biopsy on 03/31/2015 followed by definitive resection revealing a T1N1a tumor on 07/06/2015.  Oncology history is updated.  He is due to restart FOLFOX next week. He will need to more treatments to complete 6 months worth of adjuvant chemotherapy.  Labs next week: CBC differential, complete metabolic panel  FOLFOX chemotherapy next week  Labs in 3 weeks: CBC differential, complete metabolic panel, and CEA  Second cycle FOLFOX chemotherapy in 3 weeks. This will mark his last dose of adjuvant chemotherapy.  He is given a constipation sheet with directions.  Return in 3 weeks for follow-up, pretreatment labs, and final cycle of chemotherapy. At that point in time we'll review surveillance recommendations per NCCN guidelines.

## 2016-01-22 NOTE — Progress Notes (Addendum)
De[place], MD [address] [place] [address]  Rectal cancer [facility]) - Plan: heparin lock flush 100 unit/mL, sodium chloride flush (NS) 0.9 % injection 10 mL, CBC with Differential, Comprehensive metabolic panel, CBC with Differential, Comprehensive metabolic panel, CEA  CURRENT THERAPY: Restart of FOLFOX next week to complete 6 months worth of therapy.  INTERVAL HISTORY: [name] 55 y.o. male returns for followup of Stage IIIA rectal cancer.    Rectal cancer ([place])   03/31/2015 Initial Biopsy Rectum, biopsy, rectal mass - INVASIVE ADENOCARCINOMA   06/02/2015 Tumor Marker Results for Douglas, [name] (MRN [id number]) as of 08/28/2015 09:09  06/02/2015 16:00 CEA: 4.3    06/08/2015 Imaging CT abd/pelvis-Rectal primary, without evidence of metastatic disease or acute complication.   06/24/2015 Imaging CT chest- No specific features identified to suggest metastatic disease to the chest.   07/06/2015 Definitive Surgery Colon, segmental resection for tumor, rectosigmoid - INVASIVE ADENOCARCINOMA, SEE COMMENT. - TUMOR INVADES INTO SUBMUCOSA.   08/15/2015 - 11/16/2015 Chemotherapy FOLFOX x 6 cycles   09/26/2015 Treatment Plan Change Treatment held today.  5FU bolus D/C'd.  Neulasta OnPro added to each subsequent cycles of therapy.   10/04/2015 Treatment Plan Change Neulasta onpro added to all subsequent cycles of treatment.   10/18/2015 Treatment Plan Change Tx held for thrombocytopenia.  5FU CI is reduced by 20% for subsequent treatments.   11/24/2015 Miscellaneous Rad Onc consult with Dr. Lisbeth Renshaw.  Simulation done same day.  Planning to begin radiation therapy on 12/05/2015, expected treatment length of 5.5 weeks.   12/05/2015 - 01/19/2016 Chemotherapy 5FU CI days 1-7 with XRT   12/05/2015 - 01/18/2016 Radiation Therapy Dr. Lisbeth Renshaw    I personally reviewed and went over laboratory results with the patient.  The results are noted within this dictation.  He tolerated radiation with  continuous 5-FU quite well. He denies any complaints today.  He will be due to restart FOLFOX therapy for 2 cycles to complete 6 months worth of adjuvant therapy next week. He is excited to learn that he only has 2 more treatments left. New treatment plan is developed with dose reductions accordingly.  He does note some issues with constipation and I provided him with the constipation sheet.  Otherwise, he denies any complaints.  Past Medical History  Diagnosis Date  . Hypertension   . High cholesterol   . Sleep apnea   . Rectal cancer ([place])   . Depression   . Arthritis   . Diabetes mellitus     "Borderline"  . GERD (gastroesophageal reflux disease)     No weakness  . Neuropathy ([place])     "back missed up"  . Cancer [name])     rectal    has Taking medication for chronic disease; Encounter for screening colonoscopy; History of colonic polyps; Diverticulosis of colon without hemorrhage; Rectal cancer ([name]); and HTN (hypertension) on his problem list.     has No Known Allergies.  Current Outpatient Prescriptions on File Prior to Visit  Medication Sig Dispense Refill  . ACCU-CHEK SMARTVIEW test strip     . amLODipine (NORVASC) 5 MG tablet Take 1 tablet (5 mg total) by mouth every morning. 30 tablet 2  . dextrose 5 % SOLN 1,000 mL with fluorouracil 5 GM/100ML SOLN Inject into the vein. To be given every 14 days x 12 cycles. To run for 46 hours. Has not started yet.    . docusate sodium (COLACE) 100 MG capsule Take 100  mg by mouth daily.    Marland Kitchen gabapentin (NEURONTIN) 800 MG tablet Take 1 tablet (800 mg total) by mouth 3 (three) times daily. 90 tablet 2  . HYDROcodone-acetaminophen (NORCO) 7.5-325 MG tablet Take 1 tablet by mouth every 12 (twelve) hours as needed for moderate pain. 45 tablet 0  . lidocaine-prilocaine (EMLA) cream Apply a quarter size amount to port site 1 hour prior to chemo. Do not rub in. Cover with plastic wrap. 30 g 3  . lisinopril (PRINIVIL,ZESTRIL) 40 MG tablet Take  1 tablet (40 mg total) by mouth every morning. 30 tablet 2  . loperamide (IMODIUM A-D) 2 MG tablet Take 2 mg by mouth as needed for diarrhea or loose stools.    . NON FORMULARY PT HAS A C-PAP MACHINE    . nortriptyline (PAMELOR) 75 MG capsule Take 1 capsule (75 mg total) by mouth at bedtime. 30 capsule 2  . OXALIPLATIN IV Inject into the vein. Reported on 12/26/2015    . polyethylene glycol powder (GLYCOLAX/MIRALAX) powder Take 1 Container by mouth once.    Marland Kitchen tiZANidine (ZANAFLEX) 2 MG tablet Take 1 tablet (2 mg total) by mouth every 8 (eight) hours as needed for muscle spasms. 30 tablet 2  . traMADol (ULTRAM) 50 MG tablet May take 1-2 tabs every 6 hours as needed for pain 120 tablet 0  . LEUCOVORIN CALCIUM IJ Inject as directed. To be given every 14 days x 12 cycles. Has not started yet.    . [DISCONTINUED] prochlorperazine (COMPAZINE) 10 MG tablet Take 1 tablet (10 mg total) by mouth every 6 (six) hours as needed (Nausea or vomiting). (Patient not taking: Reported on 10/06/2015) 30 tablet 1   No current facility-administered medications on file prior to visit.    Past Surgical History  Procedure Laterality Date  . Back surgery    . Cholecystectomy    . Colonoscopy with propofol N/A 03/31/2015    Procedure: COLONOSCOPY WITH PROPOFOL at cecum 0842; withdrawal time=22minutes;  Surgeon: Daneil Dolin, MD;  Location: [facility];  Service: Endoscopy;  Laterality: N/A;  . Polypectomy  03/31/2015    Procedure: POLYPECTOMY;  Surgeon: Daneil Dolin, MD;  Location: [facility];  Service: Endoscopy;;  . Biopsy  03/31/2015    Procedure: BIOPSY;  Surgeon: Daneil Dolin, MD;  Location: [facility];  Service: Endoscopy;;  . Flexible sigmoidoscopy N/A 07/05/2015    Procedure: FLEXIBLE SIGMOIDOSCOPY;  Surgeon: [name], MD;  Location: [facility];  Service: Endoscopy;  Laterality: N/A;  with tattoo  . Xi robotic assisted lower anterior resection N/A 07/06/2015    Procedure: XI ROBOTIC ASSISTED LOWER ANTERIOR  RESECTION, rigid proctoscopy;  Surgeon: [name], MD;  Location: [facility];  Service: General;  Laterality: N/A;  . Portacath placement N/A 08/09/2015    Procedure: INSERTION PORT-A-CATH LEFT SUBCLAVIAN;  Surgeon: [name], MD;  Location: [facility];  Service: General;  Laterality: N/A;    Denies any headaches, dizziness, double vision, fevers, chills, night sweats, nausea, vomiting, diarrhea, chest pain, heart palpitations, shortness of breath, blood in stool, black tarry stool, urinary pain, urinary burning, urinary frequency, hematuria.   PHYSICAL EXAMINATION  ECOG PERFORMANCE STATUS: 0 - Asymptomatic  Filed Vitals:   01/23/16 1056  BP: 133/74  Pulse: 91  Temp: 97.7 F (36.5 C)  Resp: 18    GENERAL:alert, no distress, well nourished, well developed, comfortable, cooperative, obese, smiling and Unaccompanied SKIN: skin color, texture, turgor are normal, no rashes or significant lesions HEAD: Normocephalic, No masses, lesions,  tenderness or abnormalities EYES: normal, Conjunctiva are pink and non-injected EARS: External ears normal OROPHARYNX:lips, buccal mucosa, and tongue normal and mucous membranes are moist  NECK: supple, trachea midline LYMPH:  no palpable lymphadenopathy BREAST:not examined LUNGS: clear to auscultation  HEART: regular rate & rhythm ABDOMEN:abdomen soft and normal bowel sounds BACK: Back symmetric, no curvature. EXTREMITIES:less then 2 second capillary refill, no skin discoloration  NEURO: alert & oriented x 3 with fluent speech, no focal motor/sensory deficits, gait normal   LABORATORY DATA: CBC    Component Value Date/Time   WBC 3.1* 01/23/2016 1114   RBC 4.16* 01/23/2016 1114   HGB 12.6* 01/23/2016 1114   HCT 37.8* 01/23/2016 1114   PLT 157 01/23/2016 1114   MCV 90.9 01/23/2016 1114   MCH 30.3 01/23/2016 1114   MCHC 33.3 01/23/2016 1114   RDW 14.2 01/23/2016 1114   LYMPHSABS 0.4* 01/23/2016 1114   MONOABS 0.3 01/23/2016 1114   EOSABS 0.5  01/23/2016 1114   BASOSABS 0.0 01/23/2016 1114      Chemistry      Component Value Date/Time   NA 137 01/23/2016 1114   K 3.8 01/23/2016 1114   CL 104 01/23/2016 1114   CO2 28 01/23/2016 1114   BUN 10 01/23/2016 1114   CREATININE 1.02 01/23/2016 1114      Component Value Date/Time   CALCIUM 9.3 01/23/2016 1114   ALKPHOS 75 01/23/2016 1114   AST 26 01/23/2016 1114   ALT 23 01/23/2016 1114   BILITOT 0.5 01/23/2016 1114     Lab Results  Component Value Date   CEA 4.3 06/02/2015      PENDING LABS:   RADIOGRAPHIC STUDIES:  No results found.   PATHOLOGY:    ASSESSMENT AND PLAN:  Rectal cancer ([place]) Stage IIIA invasive adenocarcinoma of rectum with initial biopsy on 03/31/2015 followed by definitive resection revealing a T1N1a tumor on 07/06/2015.  Oncology history is updated.  He is due to restart FOLFOX next week. He will need to more treatments to complete 6 months worth of adjuvant chemotherapy.  Labs next week: CBC differential, complete metabolic panel  FOLFOX chemotherapy next week  Labs in 3 weeks: CBC differential, complete metabolic panel, and CEA  Second cycle FOLFOX chemotherapy in 3 weeks. This will mark his last dose of adjuvant chemotherapy.  He is given a constipation sheet with directions.  Return in 3 weeks for follow-up, pretreatment labs, and final cycle of chemotherapy. At that point in time we'll review surveillance recommendations per NCCN guidelines.      THERAPY PLAN:  He has 2 more cycles of FOLFOX to complete 6 months worth of adjuvant therapy. This will start next week. He will finish adjuvant chemotherapy at the end of March 2017.  This will be followed by the recommendations provided by NCCN guidelines pertaining to rectal cancer surveillance.  NCCN guidelines regarding surveillance for rectal cancer are as follows (2. 2017):  1. Stage I with full surgical staging:   A. Colonoscopy 1 year    1. If advanced adenoma, repeat in 1  year.    2. If no advanced adenoma, repeat in 3 years, then every 5 years  2. Stage II, III:   A. History and physical every 3-6 months for 2 years, then every 6 months for a total of 5 years.   B. CEA every 3-6 months for 2 years, then every 6 months for a total of 5 years.   C. Chest/abdominal/pelvic CT every 6-12 months (category 2b for  frequency less than 12 months) for a total of 5 years.   D. Colonoscopy in 1 year except if no preoperative colonoscopy due to obstructing lesion, colonoscopy in 3-6 months.    1. If advanced adenoma, repeat in 1 year    2. If no advanced adenoma, repeat in 3 years, then every 5 years.   E. Proctoscopy (with EUS or MRI with contrast) every 3-6 months for the first 2 years, then every 6 months for total of 5 years (for patients treated with transanal excision only).   F. PET/CT scan is not recommended.  3. Stage IV:   A. History and physical every 3-6 months for 2 years, then every 6 months for a total of 5 years.   B. CEA every 3-6 months for 2 years, then every 6 months for a total of 5 years.   C. Chest/abdominal/pelvic CT every 3-6 months (category 2b for frequency less than 6 months) for 2 years, then every 6-12 months for a total of 5 years.   D. Colonoscopy in 1 year except if no preoperative colonoscopy due to obstructing lesion, colonoscopy in 3-6 months.    1. If advanced adenoma, repeat in 1 year    2. If no advanced adenoma, repeat in 3 years, then every 5 years.   E. Proctoscopy (with EUS or MRI with contrast) every 3-6 months for the first 2 years, then every 6 months for total of 5 years (for patients treated with transanal excision only).   F. PET/CT scan is not recommended.          All questions were answered. The patient knows to call the clinic with any problems, questions or concerns. We can certainly see the patient much sooner if necessary.  Patient and plan discussed with Dr. Ancil Linsey and she is in agreement with the  aforementioned.   This note is electronically signed by: Doy Mince 01/23/2016 4:48 PM

## 2016-01-23 ENCOUNTER — Encounter (HOSPITAL_COMMUNITY): Payer: Commercial Managed Care - HMO

## 2016-01-23 ENCOUNTER — Encounter (HOSPITAL_COMMUNITY): Payer: Self-pay | Admitting: Oncology

## 2016-01-23 ENCOUNTER — Ambulatory Visit (HOSPITAL_COMMUNITY): Payer: Commercial Managed Care - HMO | Admitting: Hematology & Oncology

## 2016-01-23 ENCOUNTER — Encounter (HOSPITAL_BASED_OUTPATIENT_CLINIC_OR_DEPARTMENT_OTHER): Payer: Commercial Managed Care - HMO | Admitting: Oncology

## 2016-01-23 VITALS — BP 133/74 | HR 91 | Temp 97.7°F | Resp 18 | Wt 201.0 lb

## 2016-01-23 DIAGNOSIS — C20 Malignant neoplasm of rectum: Secondary | ICD-10-CM

## 2016-01-23 DIAGNOSIS — Z452 Encounter for adjustment and management of vascular access device: Secondary | ICD-10-CM

## 2016-01-23 LAB — CBC WITH DIFFERENTIAL/PLATELET
BASOS ABS: 0 10*3/uL (ref 0.0–0.1)
BASOS PCT: 1 %
EOS ABS: 0.5 10*3/uL (ref 0.0–0.7)
Eosinophils Relative: 16 %
HCT: 37.8 % — ABNORMAL LOW (ref 39.0–52.0)
HEMOGLOBIN: 12.6 g/dL — AB (ref 13.0–17.0)
Lymphocytes Relative: 13 %
Lymphs Abs: 0.4 10*3/uL — ABNORMAL LOW (ref 0.7–4.0)
MCH: 30.3 pg (ref 26.0–34.0)
MCHC: 33.3 g/dL (ref 30.0–36.0)
MCV: 90.9 fL (ref 78.0–100.0)
Monocytes Absolute: 0.3 10*3/uL (ref 0.1–1.0)
Monocytes Relative: 10 %
NEUTROS ABS: 1.9 10*3/uL (ref 1.7–7.7)
NEUTROS PCT: 61 %
Platelets: 157 10*3/uL (ref 150–400)
RBC: 4.16 MIL/uL — AB (ref 4.22–5.81)
RDW: 14.2 % (ref 11.5–15.5)
WBC: 3.1 10*3/uL — AB (ref 4.0–10.5)

## 2016-01-23 LAB — COMPREHENSIVE METABOLIC PANEL
ALBUMIN: 4 g/dL (ref 3.5–5.0)
ALK PHOS: 75 U/L (ref 38–126)
ALT: 23 U/L (ref 17–63)
ANION GAP: 5 (ref 5–15)
AST: 26 U/L (ref 15–41)
BUN: 10 mg/dL (ref 6–20)
CALCIUM: 9.3 mg/dL (ref 8.9–10.3)
CO2: 28 mmol/L (ref 22–32)
Chloride: 104 mmol/L (ref 101–111)
Creatinine, Ser: 1.02 mg/dL (ref 0.61–1.24)
GFR calc Af Amer: >60 mL/min (ref >60)
GFR calc non Af Amer: >60 mL/min (ref >60)
GLUCOSE: 112 mg/dL — AB (ref 65–99)
Potassium: 3.8 mmol/L (ref 3.5–5.1)
SODIUM: 137 mmol/L (ref 135–145)
Total Bilirubin: 0.5 mg/dL (ref 0.3–1.2)
Total Protein: 7.8 g/dL (ref 6.5–8.1)

## 2016-01-23 MED ORDER — HEPARIN SOD (PORK) LOCK FLUSH 100 UNIT/ML IV SOLN
500.0000 [IU] | Freq: Once | INTRAVENOUS | Status: AC
  Administered 2016-01-23: 500 [IU] via INTRAVENOUS

## 2016-01-23 MED ORDER — SODIUM CHLORIDE 0.9% FLUSH
10.0000 mL | INTRAVENOUS | Status: DC | PRN
  Administered 2016-01-23: 10 mL via INTRAVENOUS
  Filled 2016-01-23: qty 10

## 2016-01-23 MED ORDER — HEPARIN SOD (PORK) LOCK FLUSH 100 UNIT/ML IV SOLN
INTRAVENOUS | Status: AC
  Filled 2016-01-23: qty 5

## 2016-01-23 NOTE — Progress Notes (Signed)
Patient not treated today per MD discretion.   Patient has schedule to return.   Port to left chest accessed today with 20G needle.   Flushed easily with Normal Saline, good blood return, specimen drawn for labs. Heparin 500 units and de-accessed.  Patient tolerated well.  Skin to port site healing well.  Patient continues to use hydrocortisone cream to site as needed for itching.

## 2016-01-23 NOTE — Progress Notes (Signed)
No chemo treatment today.

## 2016-01-23 NOTE — Patient Instructions (Signed)
[place] at [facility] Discharge Instructions  RECOMMENDATIONS MADE BY THE CONSULTANT AND ANY TEST RESULTS WILL BE SENT TO YOUR REFERRING PHYSICIAN.  Exam and discussion today with Kirby Crigler, PA. Restart FOLFOX next week - you will receive 2 more cycles and then be finished! Office visit and chemotherapy next week and in 3 weeks.  Constipation care sheet given.  Thank you for choosing [place] at [facility] to provide your oncology and hematology care.  To afford each patient quality time with our provider, please arrive at least 15 minutes before your scheduled appointment time.   Beginning January 23rd 2017 lab work for the [facility] will be done in the  Main lab at [facility] on 1st floor. If you have a lab appointment with the [facility] please come in thru the  Main Entrance and check in at the main information desk  You need to re-schedule your appointment should you arrive 10 or more minutes late.  We strive to give you quality time with our providers, and arriving late affects you and other patients whose appointments are after yours.  Also, if you no show three or more times for appointments you may be dismissed from the clinic at the providers discretion.     Again, thank you for choosing [facility].  Our hope is that these requests will decrease the amount of time that you wait before being seen by our physicians.       _____________________________________________________________  Should you have questions after your visit to [facility], please contact our office at [phone] between the hours of 8:30 a.m. and 4:30 p.m.  Voicemails left after 4:30 p.m. will not be returned until the following business day.  For prescription refill requests, have your pharmacy contact our office.         Resources For Cancer Patients and their Caregivers ? American Cancer Society: Can assist with  transportation, wigs, general needs, runs Look Good Feel Better.        317-726-8426 ? Cancer Care: Provides financial assistance, online support groups, medication/co-pay assistance.  1-800-813-HOPE 902 797 1079) ? [facility] Assists [place] cancer patients and their families through emotional , educational and financial support.  (760) 579-1026 ? [place] DSS Where to apply for food stamps, Medicaid and utility assistance. 2483940539 ? [facility]: Transportation to medical appointments. 904-356-5901 ? Social Security Administration: May apply for disability if have a Stage IV cancer. (403)172-2212 (818)321-5776 ? [employer], Disability and Transit Services: Assists with nutrition, care and transit needs. (289)343-6475

## 2016-01-30 ENCOUNTER — Encounter (HOSPITAL_BASED_OUTPATIENT_CLINIC_OR_DEPARTMENT_OTHER): Payer: Commercial Managed Care - HMO

## 2016-01-30 VITALS — BP 138/90 | HR 97 | Temp 98.7°F | Resp 18 | Wt 200.4 lb

## 2016-01-30 DIAGNOSIS — C20 Malignant neoplasm of rectum: Secondary | ICD-10-CM

## 2016-01-30 DIAGNOSIS — Z5111 Encounter for antineoplastic chemotherapy: Secondary | ICD-10-CM | POA: Diagnosis not present

## 2016-01-30 LAB — COMPREHENSIVE METABOLIC PANEL
ALBUMIN: 4 g/dL (ref 3.5–5.0)
ALT: 26 U/L (ref 17–63)
AST: 28 U/L (ref 15–41)
Alkaline Phosphatase: 73 U/L (ref 38–126)
Anion gap: 7 (ref 5–15)
BUN: 10 mg/dL (ref 6–20)
CHLORIDE: 106 mmol/L (ref 101–111)
CO2: 26 mmol/L (ref 22–32)
Calcium: 9.2 mg/dL (ref 8.9–10.3)
Creatinine, Ser: 1.08 mg/dL (ref 0.61–1.24)
GFR calc Af Amer: >60 mL/min (ref >60)
GFR calc non Af Amer: >60 mL/min (ref >60)
GLUCOSE: 95 mg/dL (ref 65–99)
POTASSIUM: 3.8 mmol/L (ref 3.5–5.1)
SODIUM: 139 mmol/L (ref 135–145)
Total Bilirubin: 0.8 mg/dL (ref 0.3–1.2)
Total Protein: 7.8 g/dL (ref 6.5–8.1)

## 2016-01-30 LAB — CBC WITH DIFFERENTIAL/PLATELET
BASOS ABS: 0 10*3/uL (ref 0.0–0.1)
BASOS PCT: 0 %
EOS PCT: 13 %
Eosinophils Absolute: 0.4 10*3/uL (ref 0.0–0.7)
HCT: 38 % — ABNORMAL LOW (ref 39.0–52.0)
Hemoglobin: 12.5 g/dL — ABNORMAL LOW (ref 13.0–17.0)
Lymphocytes Relative: 15 %
Lymphs Abs: 0.4 10*3/uL — ABNORMAL LOW (ref 0.7–4.0)
MCH: 30.2 pg (ref 26.0–34.0)
MCHC: 32.9 g/dL (ref 30.0–36.0)
MCV: 91.8 fL (ref 78.0–100.0)
MONO ABS: 0.3 10*3/uL (ref 0.1–1.0)
Monocytes Relative: 11 %
Neutro Abs: 1.8 10*3/uL (ref 1.7–7.7)
Neutrophils Relative %: 61 %
PLATELETS: 140 10*3/uL — AB (ref 150–400)
RBC: 4.14 MIL/uL — AB (ref 4.22–5.81)
RDW: 14.1 % (ref 11.5–15.5)
WBC: 3 10*3/uL — AB (ref 4.0–10.5)

## 2016-01-30 MED ORDER — SODIUM CHLORIDE 0.9 % IV SOLN
10.0000 mg | Freq: Once | INTRAVENOUS | Status: AC
  Administered 2016-01-30: 10 mg via INTRAVENOUS
  Filled 2016-01-30: qty 1

## 2016-01-30 MED ORDER — LEUCOVORIN CALCIUM INJECTION 350 MG
400.0000 mg/m2 | Freq: Once | INTRAVENOUS | Status: AC
  Administered 2016-01-30: 792 mg via INTRAVENOUS
  Filled 2016-01-30: qty 39.6

## 2016-01-30 MED ORDER — PALONOSETRON HCL INJECTION 0.25 MG/5ML
0.2500 mg | Freq: Once | INTRAVENOUS | Status: AC
  Administered 2016-01-30: 0.25 mg via INTRAVENOUS
  Filled 2016-01-30: qty 5

## 2016-01-30 MED ORDER — SODIUM CHLORIDE 0.9% FLUSH: 10.0000 mL | INTRAVENOUS | Status: DC | PRN

## 2016-01-30 MED ORDER — DEXTROSE 5 % IV SOLN
Freq: Once | INTRAVENOUS | Status: AC
  Administered 2016-01-30: 10:00:00 via INTRAVENOUS

## 2016-01-30 MED ORDER — TRAMADOL HCL 50 MG PO TABS: ORAL_TABLET | ORAL | Status: DC

## 2016-01-30 MED ORDER — OXALIPLATIN CHEMO INJECTION 100 MG/20ML
74.8000 mg/m2 | Freq: Once | INTRAVENOUS | Status: AC
  Administered 2016-01-30: 150 mg via INTRAVENOUS
  Filled 2016-01-30: qty 30

## 2016-01-30 MED ORDER — HYDROCODONE-ACETAMINOPHEN 7.5-325 MG PO TABS: 1.0000 | ORAL_TABLET | Freq: Two times a day (BID) | ORAL | Status: DC | PRN

## 2016-01-30 MED ORDER — FLUOROURACIL CHEMO INJECTION 5 GM/100ML
1920.0000 mg/m2 | INTRAVENOUS | Status: DC
  Administered 2016-01-30: 3800 mg via INTRAVENOUS
  Filled 2016-01-30: qty 66

## 2016-01-30 NOTE — Progress Notes (Signed)
Tolerated chemo well. Continuous infusion pump intact. Ambulatory on discharge home to self. 

## 2016-01-30 NOTE — Patient Instructions (Signed)
[facility] Discharge Instructions for Patients Receiving Chemotherapy  Today you received the following chemotherapy agents Oxaliplatin, leucovorin and 40fu pump.  To help prevent nausea and vomiting after your treatment, we encourage you to take your nausea medication as instructed. If you develop nausea and vomiting that is not controlled by your nausea medication, call the clinic. If it is after clinic hours your family physician or the after hours number for the clinic or go to the Emergency Department.   BELOW ARE SYMPTOMS THAT SHOULD BE REPORTED IMMEDIATELY:  *FEVER GREATER THAN 101.0 F  *CHILLS WITH OR WITHOUT FEVER  NAUSEA AND VOMITING THAT IS NOT CONTROLLED WITH YOUR NAUSEA MEDICATION  *UNUSUAL SHORTNESS OF BREATH  *UNUSUAL BRUISING OR BLEEDING  TENDERNESS IN MOUTH AND THROAT WITH OR WITHOUT PRESENCE OF ULCERS  *URINARY PROBLEMS  *BOWEL PROBLEMS  UNUSUAL RASH Items with * indicate a potential emergency and should be followed up as soon as possible.  One of the nurses will contact you 24 hours after your treatment. Please let the nurse know about any problems that you may have experienced. Feel free to call the clinic you have any questions or concerns. The clinic phone number is [phone].   I have been informed and understand all the instructions given to me. I know to contact the clinic, my physician, or go to the Emergency Department if any problems should occur. I do not have any questions at this time, but understand that I may call the clinic during office hours or the Patient Navigator at [phone] should I have any questions or need assistance in obtaining follow up care.    __________________________________________  _____________  __________ Signature of Patient or Authorized Representative            Date                   Time    __________________________________________ Nurse's Signature

## 2016-01-31 ENCOUNTER — Ambulatory Visit: Payer: Self-pay

## 2016-02-01 ENCOUNTER — Encounter (HOSPITAL_BASED_OUTPATIENT_CLINIC_OR_DEPARTMENT_OTHER): Payer: Commercial Managed Care - HMO

## 2016-02-01 ENCOUNTER — Other Ambulatory Visit: Payer: Self-pay

## 2016-02-01 ENCOUNTER — Other Ambulatory Visit: Payer: Self-pay | Admitting: Licensed Clinical Social Worker

## 2016-02-01 VITALS — BP 129/84 | HR 92 | Temp 98.1°F | Resp 18

## 2016-02-01 DIAGNOSIS — Z452 Encounter for adjustment and management of vascular access device: Secondary | ICD-10-CM

## 2016-02-01 DIAGNOSIS — C20 Malignant neoplasm of rectum: Secondary | ICD-10-CM

## 2016-02-01 MED ORDER — SODIUM CHLORIDE 0.9% FLUSH
10.0000 mL | INTRAVENOUS | Status: DC | PRN
  Administered 2016-02-01: 10 mL
  Filled 2016-02-01: qty 10

## 2016-02-01 MED ORDER — HEPARIN SOD (PORK) LOCK FLUSH 100 UNIT/ML IV SOLN
INTRAVENOUS | Status: AC
  Filled 2016-02-01: qty 5

## 2016-02-01 MED ORDER — HEPARIN SOD (PORK) LOCK FLUSH 100 UNIT/ML IV SOLN
500.0000 [IU] | Freq: Once | INTRAVENOUS | Status: AC | PRN
  Administered 2016-02-01: 500 [IU]

## 2016-02-01 NOTE — Patient Outreach (Signed)
[facility]) Care Management  [facility] Care Manager  02/01/2016   [name] [birth year] [id number]  Telephone Monthly Assessment   Subjective:   Providers: Primary MD: Dr. Rosita Fire- last appt 11/2015 and next appt 02/07/16 Oncologist: Dr. Patrici Ranks, MD and [name], PA-C - next appt 02/13/16 Pain Management: Past services with Dr. Merlene Laughter Radiation:  Last appt 01/2016 (completed last week) Insurance: [place] [id number] and Medicare Extra Help  Social:  Patient reached at cell phone # [phone]. Patient is divorced and lives in his home;  2 children. Patient lives with other family members: 6 people living in the home.  Caregiver:Good friend/ [name].  Falls: None Transportation: Owns a [employer] and still driving when gas money available. (Cancer Association providing transportation services when needed).   DME: Cane, CPap (2006), glucometer/Accu-chek (07/2015), eye glasses  Financial: H/o Issues with specialty co-pays $45.00 and accumulation of hospital bills.  Financial Assessment completed by [facility] SW 10/21/15.   Medicaid application. H/o patient has still not collected documents needed to apply for Medicaid. Patient states he has not completed this task but plans to gather all information and take to [facility] office.  Resources:  -Physicist, medical -SSD (2006) due to back and left leg issues.  -Medicare "Extra Help" -[place] for supportive counseling and gas card resource.  Advanced Directives: None. Patient interested in completing and confirms he has the [facility] Advanced Directive document. Patient states he does not have anyone he wants to use as HCPOA and has not completed the document.  H/o [facility] SW addressed on SW contact: patient to read/review advanced directives booklet and communicate, as needed, with CSW about advanced directives preparation.  Cancer:  Stage 3 Rectal  Cancer Chemo: yes.Patient thinks last chemo treatment will be around 02/13/16.  States completed Radiation last week.  Doing well and reports not needs.   HTN (2010) Patient is taking BP medication everyday.  BP 133/74.   Diabetes (2010) Patient is taking no medication to manage blood sugar. BS readings: Checks 1-2 times a day.  A1c: 6.3 on 06/30/15  Weight: 201 Plans diabetic follow-up with Dr. Legrand Rams on 02/07/2016  Medications Taking less than 10 medications Most have no co-pay;active with Medicare "Extra Help".  Using [facility] mail order.  No medication issues identified this call.   Objective:   Current Medications:  Current Outpatient Prescriptions  Medication Sig Dispense Refill  . ACCU-CHEK SMARTVIEW test strip     . amLODipine (NORVASC) 5 MG tablet Take 1 tablet (5 mg total) by mouth every morning. 30 tablet 2  . dextrose 5 % SOLN 1,000 mL with fluorouracil 5 GM/100ML SOLN Inject into the vein. To be given every 14 days x 12 cycles. To run for 46 hours. Has not started yet.    . docusate sodium (COLACE) 100 MG capsule Take 100 mg by mouth daily.    Marland Kitchen gabapentin (NEURONTIN) 800 MG tablet Take 1 tablet (800 mg total) by mouth 3 (three) times daily. 90 tablet 2  . HYDROcodone-acetaminophen (NORCO) 7.5-325 MG tablet Take 1 tablet by mouth every 12 (twelve) hours as needed for moderate pain. 45 tablet 0  . LEUCOVORIN CALCIUM IJ Inject as directed. To be given every 14 days x 12 cycles. Has not started yet.    . lidocaine-prilocaine (EMLA) cream Apply a quarter size amount to port site 1 hour prior to chemo. Do not rub in. Cover with plastic wrap. 30 g 3  .  lisinopril (PRINIVIL,ZESTRIL) 40 MG tablet Take 1 tablet (40 mg total) by mouth every morning. 30 tablet 2  . loperamide (IMODIUM A-D) 2 MG tablet Take 2 mg by mouth as needed for diarrhea or loose stools.    . NON FORMULARY PT HAS A C-PAP MACHINE    . nortriptyline (PAMELOR) 75 MG capsule Take 1 capsule (75  mg total) by mouth at bedtime. 30 capsule 2  . OXALIPLATIN IV Inject into the vein. Reported on 12/26/2015    . polyethylene glycol powder (GLYCOLAX/MIRALAX) powder Take 1 Container by mouth once.    Marland Kitchen tiZANidine (ZANAFLEX) 2 MG tablet Take 1 tablet (2 mg total) by mouth every 8 (eight) hours as needed for muscle spasms. 30 tablet 2  . traMADol (ULTRAM) 50 MG tablet May take 1-2 tabs every 6 hours as needed for pain 120 tablet 0  . [DISCONTINUED] prochlorperazine (COMPAZINE) 10 MG tablet Take 1 tablet (10 mg total) by mouth every 6 (six) hours as needed (Nausea or vomiting). (Patient not taking: Reported on 10/06/2015) 30 tablet 1   No current facility-administered medications for this visit.   Facility-Administered Medications Ordered in Other Visits  Medication Dose Route Frequency Provider Last Rate Last Dose  . heparin lock flush 100 unit/mL  500 Units Intracatheter Once PRN Patrici Ranks, MD      . sodium chloride flush (NS) 0.9 % injection 10 mL  10 mL Intracatheter PRN Patrici Ranks, MD        Functional Status:  In your present state of health, do you have any difficulty performing the following activities: 02/01/2016 12/14/2015  Hearing? N N  Vision? N -  Difficulty concentrating or making decisions? N N  Walking or climbing stairs? Y N  Dressing or bathing? N N  Doing errands, shopping? N N  Preparing Food and eating ? N N  Using the Toilet? N N  In the past six months, have you accidently leaked urine? N N  Do you have problems with loss of bowel control? - N  Managing your Medications? N N  Managing your Finances? N N  Housekeeping or managing your Housekeeping? N N    Fall/Depression Screening: PHQ 2/9 Scores 12/14/2015 09/22/2015 09/20/2015  PHQ - 2 Score 2 0 0  PHQ- 9 Score 5 - -    Assessment:  No ED or hospital admissions over the past 6 months.  Transportation services in place and patient is not missing any appointments.  Medicaid application remains  unresolved with much RN CM oversight and SW involvement.  Advance Directive:  Goal met of introduction and education on importance of document but patient not ready to complete.    PLAN Referral Date: 08/17/15 Referral Source: [place] Referral Issue: Cancer Screening Date: 08/19/15 Case Management Start Date: 08/19/15  Telephonic RN CM Start Date:  08/19/15 -02/01/2016 Initial Assessment Date: 09/20/2015 Program: Other (Cancer) 08/19/15 - 02/01/2016  [facility] SW remains active for the following:  -Medicaid application - pending patient gathering documents [facility] SW remains active).  -Advanced Directive: pending patient reading and preparing document for completion as needed.   RN CM closed case to telephonic RN CM services: goals of care met.   RN CM notified assigned SW of case closure and provided update on SW issues MCD application and Advanced Directives.    RN CM advised to please notify MD of any changes in condition prior to scheduled appt's.  RN CM provided contact name and #, 24-hour nurse line # [phone].  RN CM confirmed patient is aware of 911 services for urgent emergency needs.Mariann Laster, RN, BSN, [facility], [place] Management Care Management Coordinator [phone] Direct [phone] Cell [phone] Office [phone] Fax

## 2016-02-01 NOTE — Patient Outreach (Signed)
Assessment:  CSW called client on 02/01/16 and spoke via phone with client. CSW verified client identity. CSW and client spoke of client needs. [name] said he completed his radiation treatments.  Client said he is scheduled for chemotherapy treatments for one more week. Client said he has a Lucianne Lei he drives to his medical appointments. He is now driving himself to all his scheduled medical appointments.  He has [employer]. He said he receives some of his prescribed medications through [employer] delivery. CSW and client spoke of client care plan. CSW encouraged client to continue to collect needed financial documents to enable client to apply for Medicaid. Client said he is working with his son to help him collect financial documents needed for client to apply for Medicaid. Client and CSW updated client assessment information needed. CSW encouraged client to call CSW as needed at [phone] to discuss social work needs of client.    Plan: Client to collect needed financial documents in next 30 days to enable client to apply for Medicaid benefit for client. CSW to call client in three weeks to assess client needs.   [name].[name] MSW, LCSW Licensed Clinical Social Worker [facility] Care Management [phone]

## 2016-02-01 NOTE — Progress Notes (Signed)
Continuous infusion pump complete. Disconnected pump from patient. Flushed port per protocol and port needle removed. Denies complaints post chemo.

## 2016-02-01 NOTE — Patient Instructions (Signed)
[facility] at [facility] Discharge Instructions  RECOMMENDATIONS MADE BY THE CONSULTANT AND ANY TEST RESULTS WILL BE SENT TO YOUR REFERRING PHYSICIAN.  Disconnected continuous infusion pump. Return as scheduled.  Thank you for choosing  Cancer Center at [facility] to provide your oncology and hematology care.  To afford each patient quality time with our provider, please arrive at least 15 minutes before your scheduled appointment time.   Beginning January 23rd 2017 lab work for the Cancer Center will be done in the  Main lab at [place] on 1st floor. If you have a lab appointment with the Cancer Center please come in thru the  Main Entrance and check in at the main information desk  You need to re-schedule your appointment should you arrive 10 or more minutes late.  We strive to give you quality time with our providers, and arriving late affects you and other patients whose appointments are after yours.  Also, if you no show three or more times for appointments you may be dismissed from the clinic at the providers discretion.     Again, thank you for choosing [facility].  Our hope is that these requests will decrease the amount of time that you wait before being seen by our physicians.       _____________________________________________________________  Should you have questions after your visit to [facility], please contact our office at [phone] between the hours of 8:30 a.m. and 4:30 p.m.  Voicemails left after 4:30 p.m. will not be returned until the following business day.  For prescription refill requests, have your pharmacy contact our office.         Resources For Cancer Patients and their Caregivers ? American Cancer Society: Can assist with transportation, wigs, general needs, runs Look Good Feel Better.        1-888-227-6333 ? Cancer Care: Provides financial assistance, online support groups,  medication/co-pay assistance.  1-800-813-HOPE (4673) ? [facility] Assists [place] cancer patients and their families through emotional , educational and financial support.  336-427-4357 ? [place] DSS Where to apply for food stamps, Medicaid and utility assistance. 336-342-1394 ? RCATS: Transportation to medical appointments. 336-347-2287 ? Social Security Administration: May apply for disability if have a Stage IV cancer. 336-342-7796 1-800-772-1213 ? [place] Aging, Disability and Transit Services: Assists with nutrition, care and transit needs. 336-349-2343 

## 2016-02-07 DIAGNOSIS — Z0000 Encounter for general adult medical examination without abnormal findings: Secondary | ICD-10-CM | POA: Diagnosis not present

## 2016-02-07 DIAGNOSIS — I10 Essential (primary) hypertension: Secondary | ICD-10-CM | POA: Diagnosis not present

## 2016-02-07 DIAGNOSIS — G4733 Obstructive sleep apnea (adult) (pediatric): Secondary | ICD-10-CM | POA: Diagnosis not present

## 2016-02-07 DIAGNOSIS — C20 Malignant neoplasm of rectum: Secondary | ICD-10-CM | POA: Diagnosis not present

## 2016-02-07 DIAGNOSIS — E785 Hyperlipidemia, unspecified: Secondary | ICD-10-CM | POA: Diagnosis not present

## 2016-02-07 DIAGNOSIS — E1165 Type 2 diabetes mellitus with hyperglycemia: Secondary | ICD-10-CM | POA: Diagnosis not present

## 2016-02-12 DIAGNOSIS — C20 Malignant neoplasm of rectum: Secondary | ICD-10-CM | POA: Diagnosis not present

## 2016-02-13 ENCOUNTER — Encounter (HOSPITAL_BASED_OUTPATIENT_CLINIC_OR_DEPARTMENT_OTHER): Payer: Commercial Managed Care - HMO

## 2016-02-13 ENCOUNTER — Encounter (HOSPITAL_COMMUNITY): Payer: Self-pay

## 2016-02-13 ENCOUNTER — Encounter (HOSPITAL_COMMUNITY): Payer: Self-pay | Admitting: Hematology & Oncology

## 2016-02-13 ENCOUNTER — Encounter (HOSPITAL_BASED_OUTPATIENT_CLINIC_OR_DEPARTMENT_OTHER): Payer: Commercial Managed Care - HMO | Admitting: Hematology & Oncology

## 2016-02-13 ENCOUNTER — Encounter (HOSPITAL_COMMUNITY): Payer: Self-pay | Admitting: Lab

## 2016-02-13 VITALS — BP 110/75 | HR 95 | Temp 97.0°F | Resp 18 | Wt 194.2 lb

## 2016-02-13 DIAGNOSIS — Z5111 Encounter for antineoplastic chemotherapy: Secondary | ICD-10-CM

## 2016-02-13 DIAGNOSIS — G608 Other hereditary and idiopathic neuropathies: Secondary | ICD-10-CM

## 2016-02-13 DIAGNOSIS — C20 Malignant neoplasm of rectum: Secondary | ICD-10-CM

## 2016-02-13 DIAGNOSIS — Z95828 Presence of other vascular implants and grafts: Secondary | ICD-10-CM

## 2016-02-13 LAB — CBC WITH DIFFERENTIAL/PLATELET
BASOS ABS: 0 10*3/uL (ref 0.0–0.1)
Basophils Relative: 1 %
EOS PCT: 10 %
Eosinophils Absolute: 0.3 10*3/uL (ref 0.0–0.7)
HEMATOCRIT: 38.2 % — AB (ref 39.0–52.0)
Hemoglobin: 12.8 g/dL — ABNORMAL LOW (ref 13.0–17.0)
LYMPHS ABS: 0.4 10*3/uL — AB (ref 0.7–4.0)
Lymphocytes Relative: 12 %
MCH: 30.6 pg (ref 26.0–34.0)
MCHC: 33.5 g/dL (ref 30.0–36.0)
MCV: 91.4 fL (ref 78.0–100.0)
MONO ABS: 0.5 10*3/uL (ref 0.1–1.0)
Monocytes Relative: 15 %
NEUTROS ABS: 2.2 10*3/uL (ref 1.7–7.7)
Neutrophils Relative %: 62 %
Platelets: 133 10*3/uL — ABNORMAL LOW (ref 150–400)
RBC: 4.18 MIL/uL — AB (ref 4.22–5.81)
RDW: 13.5 % (ref 11.5–15.5)
WBC: 3.5 10*3/uL — AB (ref 4.0–10.5)

## 2016-02-13 LAB — COMPREHENSIVE METABOLIC PANEL
ALK PHOS: 78 U/L (ref 38–126)
ALT: 22 U/L (ref 17–63)
AST: 25 U/L (ref 15–41)
Albumin: 4.1 g/dL (ref 3.5–5.0)
Anion gap: 8 (ref 5–15)
BUN: 16 mg/dL (ref 6–20)
CALCIUM: 9.4 mg/dL (ref 8.9–10.3)
CHLORIDE: 109 mmol/L (ref 101–111)
CO2: 24 mmol/L (ref 22–32)
CREATININE: 1.02 mg/dL (ref 0.61–1.24)
Glucose, Bld: 108 mg/dL — ABNORMAL HIGH (ref 65–99)
Potassium: 3.8 mmol/L (ref 3.5–5.1)
Sodium: 141 mmol/L (ref 135–145)
Total Bilirubin: 0.6 mg/dL (ref 0.3–1.2)
Total Protein: 8 g/dL (ref 6.5–8.1)

## 2016-02-13 MED ORDER — DEXTROSE 5 % IV SOLN
400.0000 mg/m2 | Freq: Once | INTRAVENOUS | Status: AC
  Administered 2016-02-13: 792 mg via INTRAVENOUS
  Filled 2016-02-13: qty 39.6

## 2016-02-13 MED ORDER — PALONOSETRON HCL INJECTION 0.25 MG/5ML
0.2500 mg | Freq: Once | INTRAVENOUS | Status: AC
  Administered 2016-02-13: 0.25 mg via INTRAVENOUS
  Filled 2016-02-13: qty 5

## 2016-02-13 MED ORDER — DEXTROSE 5 % IV SOLN
Freq: Once | INTRAVENOUS | Status: AC
  Administered 2016-02-13: 10:00:00 via INTRAVENOUS

## 2016-02-13 MED ORDER — OXALIPLATIN CHEMO INJECTION 100 MG/20ML
74.8000 mg/m2 | Freq: Once | INTRAVENOUS | Status: AC
  Administered 2016-02-13: 150 mg via INTRAVENOUS
  Filled 2016-02-13: qty 30

## 2016-02-13 MED ORDER — HEPARIN SOD (PORK) LOCK FLUSH 100 UNIT/ML IV SOLN: 500.0000 [IU] | Freq: Once | INTRAVENOUS | Status: DC | PRN

## 2016-02-13 MED ORDER — SODIUM CHLORIDE 0.9 % IV SOLN
1920.0000 mg/m2 | INTRAVENOUS | Status: DC
  Administered 2016-02-13: 3800 mg via INTRAVENOUS
  Filled 2016-02-13: qty 76

## 2016-02-13 MED ORDER — SODIUM CHLORIDE 0.9% FLUSH
10.0000 mL | INTRAVENOUS | Status: DC | PRN
  Administered 2016-02-13: 10 mL
  Filled 2016-02-13: qty 10

## 2016-02-13 MED ORDER — SODIUM CHLORIDE 0.9 % IV SOLN
10.0000 mg | Freq: Once | INTRAVENOUS | Status: AC
  Administered 2016-02-13: 10 mg via INTRAVENOUS
  Filled 2016-02-13: qty 1

## 2016-02-13 NOTE — Patient Instructions (Signed)
[place] at [facility] Discharge Instructions  RECOMMENDATIONS MADE BY THE CONSULTANT AND ANY TEST RESULTS WILL BE SENT TO YOUR REFERRING PHYSICIAN.   Exam and discussion by Dr Whitney Muse today You will need another colonoscopy later this year. Also need more CT scans later this year. Referral back to Dr Grandville Silos, they will call you with this appt Port flushes every 8 weeks  Return to see the doctor in 2 weeks Please call the clinic if you have any questions or concerns    Thank you for choosing [place] at [facility] to provide your oncology and hematology care.  To afford each patient quality time with our provider, please arrive at least 15 minutes before your scheduled appointment time.   Beginning January 23rd 2017 lab work for the [facility] will be done in the  Main lab at [facility] on 1st floor. If you have a lab appointment with the [facility] please come in thru the  Main Entrance and check in at the main information desk  You need to re-schedule your appointment should you arrive 10 or more minutes late.  We strive to give you quality time with our providers, and arriving late affects you and other patients whose appointments are after yours.  Also, if you no show three or more times for appointments you may be dismissed from the clinic at the providers discretion.     Again, thank you for choosing [facility].  Our hope is that these requests will decrease the amount of time that you wait before being seen by our physicians.       _____________________________________________________________  Should you have questions after your visit to [facility], please contact our office at [phone] between the hours of 8:30 a.m. and 4:30 p.m.  Voicemails left after 4:30 p.m. will not be returned until the following business day.  For prescription refill requests, have your pharmacy contact our office.          Resources For Cancer Patients and their Caregivers ? American Cancer Society: Can assist with transportation, wigs, general needs, runs Look Good Feel Better.        289-637-4853 ? Cancer Care: Provides financial assistance, online support groups, medication/co-pay assistance.  1-800-813-HOPE 3433448477) ? [place] Assists [place] cancer patients and their families through emotional , educational and financial support.  862-676-1564 ? [place] DSS Where to apply for food stamps, Medicaid and utility assistance. (352)407-3163 ? RCATS: Transportation to medical appointments. 320 369 0362 ? Social Security Administration: May apply for disability if have a Stage IV cancer. 347-705-5719 (352)223-6509 ? [employer], Disability and Transit Services: Assists with nutrition, care and transit needs. 825-609-5506

## 2016-02-13 NOTE — Progress Notes (Signed)
[place] at [facility] Progress Note  Patient Care Team: Rosita Fire, MD as PCP - General (Internal Medicine) Daneil Dolin, MD as Consulting Physician (Gastroenterology) Katha Cabal, LCSW as [place] Management (Licensed Clinical Social Worker)  CHIEF COMPLAINTS/PURPOSE OF CONSULTATION:  Stage IIIA Rectal carcinoma Robotic assisted LAR, rigid proctoscopy with Dr. [name] 7/00/1749 Lymph nodes: number examined 14; number positive: 1 Pathologic Staging: pT1, pN1a, pMX Normal preoperative CEA Adjuvant FOLFOX Plans for concurrent XRT/5-FU  HISTORY OF PRESENTING ILLNESS:  [name] 55 y.o. male is here for follow-up of his rectal cancer. He has undergone definitive surgical therapy and did remarkably well. Unfortunately he did have one lymph node that contained metastatic disease. CT imaging prior to surgery revealed no evidence of metastatic disease. He is here for his last cycle of adjuvant FOLFOX. He has finished concurrent 5-FU and XRT.   [name] is here alone.  He is excited about finishing treatment today. He will return to have his pump taken off on 02/15/16.  His bowels are "back on schedule" since completing radiation. He is eating well. Denies nausea or vomiting. After his last cycle, he experienced odd sensations while touching or eating cold things. He notes he then remembered he was back on the "bad" chemotherapy drug.   He denies persistent neuropathy. He notes no stumbling or falling. Fingers and toes feel normal today. No nausea, vomiting or diarrhea.    MEDICAL HISTORY:  Past Medical History  Diagnosis Date  . Hypertension   . High cholesterol   . Sleep apnea   . Rectal cancer ([place])   . Depression   . Arthritis   . Diabetes mellitus     "Borderline"  . GERD (gastroesophageal reflux disease)     No weakness  . Neuropathy ([place])     "back missed up"  . Cancer [facility])     rectal    SURGICAL  HISTORY: Past Surgical History  Procedure Laterality Date  . Back surgery    . Cholecystectomy    . Colonoscopy with propofol N/A 03/31/2015    Procedure: COLONOSCOPY WITH PROPOFOL at cecum 0842; withdrawal time=47mnutes;  Surgeon: RDaneil Dolin MD;  Location: [facility] ORS;  Service: Endoscopy;  Laterality: N/A;  . Polypectomy  03/31/2015    Procedure: POLYPECTOMY;  Surgeon: RDaneil Dolin MD;  Location: [facility] ORS;  Service: Endoscopy;;  . Biopsy  03/31/2015    Procedure: BIOPSY;  Surgeon: RDaneil Dolin MD;  Location: [facility];  Service: Endoscopy;;  . Flexible sigmoidoscopy N/A 07/05/2015    Procedure: FLEXIBLE SIGMOIDOSCOPY;  Surgeon: ALeighton Ruff MD;  Location: [facility];  Service: Endoscopy;  Laterality: N/A;  with tattoo  . Xi robotic assisted lower anterior resection N/A 07/06/2015    Procedure: XI ROBOTIC ASSISTED LOWER ANTERIOR RESECTION, rigid proctoscopy;  Surgeon: ALeighton Ruff MD;  Location: [facility];  Service: General;  Laterality: N/A;  . Portacath placement N/A 08/09/2015    Procedure: INSERTION PORT-A-CATH LEFT SUBCLAVIAN;  Surgeon: ALeighton Ruff MD;  Location: [facility]  Service: General;  Laterality: N/A;    SOCIAL HISTORY: Social History   Social History  . Marital Status: Legally Separated    Spouse Name: N/A  . Number of Children: N/A  . Years of Education: N/A   Occupational History  . Not on file.   Social History Main Topics  . Smoking status: Former Smoker    Quit date: 03/23/2006  . Smokeless tobacco: Never Used  .  Alcohol Use: No  . Drug Use: No  . Sexual Activity: Not Currently   Other Topics Concern  . 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 Colon cancer Neg Hx     "Not that I know of"  . Colon polyps Neg Hx     "not that I know of"  .  Hypertension      "Not sure who, I don't know much about my family"  . Diabetes      "Not sure who, I don't know much about my family"   has no family status information on file.  Mother deceased, 12, liver cancer Father living, 45 3 brothers 1 sister  ALLERGIES:  has No Known Allergies.  MEDICATIONS:  Current Outpatient Prescriptions  Medication Sig Dispense Refill  . ACCU-CHEK SMARTVIEW test strip     . amLODipine (NORVASC) 5 MG tablet Take 1 tablet (5 mg total) by mouth every morning. 30 tablet 2  . dextrose 5 % SOLN 1,000 mL with fluorouracil 5 GM/100ML SOLN Inject into the vein. To be given every 14 days x 12 cycles. To run for 46 hours. Has not started yet.    . docusate sodium (COLACE) 100 MG capsule Take 100 mg by mouth daily.    Marland Kitchen gabapentin (NEURONTIN) 800 MG tablet Take 1 tablet (800 mg total) by mouth 3 (three) times daily. 90 tablet 2  . HYDROcodone-acetaminophen (NORCO) 7.5-325 MG tablet Take 1 tablet by mouth every 12 (twelve) hours as needed for moderate pain. 45 tablet 0  . LEUCOVORIN CALCIUM IJ Inject as directed. To be given every 14 days x 12 cycles. Has not started yet.    . lidocaine-prilocaine (EMLA) cream Apply a quarter size amount to port site 1 hour prior to chemo. Do not rub in. Cover with plastic wrap. 30 g 3  . lisinopril (PRINIVIL,ZESTRIL) 40 MG tablet Take 1 tablet (40 mg total) by mouth every morning. 30 tablet 2  . loperamide (IMODIUM A-D) 2 MG tablet Take 2 mg by mouth as needed for diarrhea or loose stools.    . NON FORMULARY PT HAS A C-PAP MACHINE    . nortriptyline (PAMELOR) 75 MG capsule Take 1 capsule (75 mg total) by mouth at bedtime. 30 capsule 2  . OXALIPLATIN IV Inject into the vein. Reported on 12/26/2015    . polyethylene glycol powder (GLYCOLAX/MIRALAX) powder Take 1 Container by mouth once.    Marland Kitchen tiZANidine (ZANAFLEX) 2 MG tablet Take 1 tablet (2 mg total) by mouth every 8 (eight) hours as needed for muscle spasms. 30 tablet 2  . traMADol  (ULTRAM) 50 MG tablet May take 1-2 tabs every 6 hours as needed for pain 120 tablet 0  . [DISCONTINUED] prochlorperazine (COMPAZINE) 10 MG tablet Take 1 tablet (10 mg total) by mouth every 6 (six) hours as needed (Nausea or vomiting). (Patient not taking: Reported on 10/06/2015) 30 tablet 1   No current facility-administered medications for this visit.    Review of Systems  Constitutional: Negative for fever, chills, weight loss.  In treatment chair. HENT: Negative for congestion, hearing loss, nosebleeds, sore throat and tinnitus.   Eyes: Negative for blurred vision, double vision, pain and discharge.  Respiratory: Negative for cough, hemoptysis, sputum production, shortness of breath and wheezing.  Cardiovascular: Negative for chest pain, palpitations, claudication, leg swelling and PND.  Gastrointestinal:  Negative for heartburn, nausea, vomiting, abdominal pain, diarrhea, and melena.  Genitourinary: Negative for dysuria, urgency, frequency and hematuria.  Musculoskeletal: Negative for myalgias, joint pain and falls.  Skin: Negative for itching and rash.  Neurological: Negative for dizziness, tingling, tremors, sensory change, speech change, focal weakness, seizures, loss of consciousness, weakness and headaches.  Endo/Heme/Allergies: Does not bruise/bleed easily.  Psychiatric/Behavioral: Negative for depression, suicidal ideas, memory loss and substance abuse. The patient is not nervous/anxious and does not have insomnia.   All other systems reviewed and are negative.  14 point ROS was done and is otherwise as detailed above or in HPI   Physical Exam   Vitals with BMI 02/13/2016  Height   Weight 194 lbs 3 oz  BMI   Systolic 809  Diastolic 83  Pulse 983  Respirations 18   Constitutional: He is oriented to person, place, and time and well-developed, well-nourished, and in no distress.  HENT:  Head: Normocephalic and atraumatic.  Nose: Nose normal.  Mouth/Throat: Oropharynx  is clear and moist. No oropharyngeal exudate.  Eyes: Conjunctivae and EOM are normal. Pupils are equal, round, and reactive to light. Right eye exhibits no discharge. Left eye exhibits no discharge. No scleral icterus.  Neck: Normal range of motion. Neck supple. No tracheal deviation present. No thyromegaly present.  Cardiovascular: Normal rate, regular rhythm and normal heart sounds.  Exam reveals no gallop and no friction rub.   No murmur heard. Pulmonary/Chest: Effort normal and breath sounds normal. He has no wheezes. He has no rales.  Abdominal: Soft. Bowel sounds are normal. He exhibits no distension and no mass. There is no tenderness. There is no rebound and no guarding.  well-healing surgical incision sites  Genitourinary:  Musculoskeletal: Normal range of motion. He exhibits no edema.  Lymphadenopathy:    He has no cervical adenopathy.  Neurological: He is alert and oriented to person, place, and time. He has normal reflexes. No cranial nerve deficit. Gait normal. Coordination normal.  Skin: Skin is warm and dry. No rash noted.  Psychiatric: Mood, memory, affect and judgment normal.  Nursing note and vitals reviewed.   LABORATORY DATA:  I have reviewed the data as listed CBC    Component Value Date/Time   WBC 3.5* 02/13/2016 0846   RBC 4.18* 02/13/2016 0846   HGB 12.8* 02/13/2016 0846   HCT 38.2* 02/13/2016 0846   PLT 133* 02/13/2016 0846   MCV 91.4 02/13/2016 0846   MCH 30.6 02/13/2016 0846   MCHC 33.5 02/13/2016 0846   RDW 13.5 02/13/2016 0846   LYMPHSABS 0.4* 02/13/2016 0846   MONOABS 0.5 02/13/2016 0846   EOSABS 0.3 02/13/2016 0846   BASOSABS 0.0 02/13/2016 0846    CMP     Component Value Date/Time   NA 141 02/13/2016 0846   K 3.8 02/13/2016 0846   CL 109 02/13/2016 0846   CO2 24 02/13/2016 0846   GLUCOSE 108* 02/13/2016 0846   BUN 16 02/13/2016 0846   CREATININE 1.02 02/13/2016 0846   CALCIUM 9.4 02/13/2016 0846   PROT 8.0 02/13/2016 0846   ALBUMIN 4.1  02/13/2016 0846   AST 25 02/13/2016 0846   ALT 22 02/13/2016 0846   ALKPHOS 78 02/13/2016 0846   BILITOT 0.6 02/13/2016 0846   GFRNONAA >60 02/13/2016 0846   GFRAA >60 02/13/2016 0846   PATHOLOGY:REPORT OF SURGICAL PATHOLOGY ADDITIONAL INFORMATION: 1. Mismatch Repair (MMR) Protein Immunohistochemistry (IHC) IHC Expression Result:  MLH1: Preserved nuclear expression (greater 50% tumor expression) MSH2: Preserved nuclear expression (greater 50% tumor expression) MSH6: Preserved nuclear expression (greater 50% tumor expression) PMS2: Preserved nuclear expression (greater 50% tumor expression) * Internal control demonstrates intact nuclear expression Interpretation: NORMAL There is preserved expression of the major and minor MMR proteins. There is a very low probability that microsatellite instability (MSI) is present. However, certain clinically significant MMR protein mutations may result in preservation of nuclear expression. It is recommended that the preservation of protein expression be correlated with molecular based MSI testing. References: 1. Guidelines on Genetic Evaluation and Management of Lynch Syndrome: A Consensus Statement by the [place] Multi-Society Task Force on Colorectal Cancer Gae Dry. Sherlie Ban , MD, and others . Am Nicki [place] 2014; [id number]; doi: 10.1038/ajg.2014.186; published online 09 June 2013 2. Outcomes of screening endometrial cancer patients for Lynch syndrome by patient-administered checklist. Olena Heckle MS, and others. Gynecol Oncol 2013;131(3):619-623. Mali RUND DO Pathologist, Electronic Signature ( Signed 07/11/2015) FINAL DIAGNOSIS Diagnosis 1. Colon, segmental resection for tumor, rectosigmoid - INVASIVE ADENOCARCINOMA, SEE COMMENT. - TUMOR INVADES INTO SUBMUCOSA. 1 of 4 FINAL for HUGHES, WYNDHAM A [phone]) Diagnosis(continued) - ONE LYMPH NODE, POSITIVE FOR METASTATIC TUMOR (1/14). - SURGICAL MARGINS, NEGATIVE FOR TUMOR. - SEE TUMOR  SYNOPTIC TEMPLATE BELOW. 2. Colon, resection margin (donut), final distal margin - BENIGN COLON. - NEGATIVE FOR DYSPLASIA OR MALIGNANCY. Microscopic Comment 1. COLON AND RECTUM (INCLUDING TRANS-ANAL RESECTION): Specimen: Rectosigmoid Procedure: Resection Tumor site: Mid rectum Specimen integrity: Intact Macroscopic intactness of mesorectum: Near complete Macroscopic tumor perforation: Absent Invasive tumor: Maximum size: 2.0 cm Histologic type(s): Adenocarcinoma Histologic grade and differentiation: G2: moderately differentiated/low grade Type of polyp in which invasive carcinoma arose: Adenoma with high grade dysplasia Microscopic extension of invasive tumor: Tumor extends into submucosa and is immediately adjacent to muscularis propria. Lymph-Vascular invasion: Present Peri-neural invasion: Present Tumor deposit(s) (discontinuous extramural extension): Absent Resection margins: Proximal margin: Negative Distal margin: Negative Circumferential (radial) (posterior ascending, posterior descending; lateral and posterior mid-rectum; and entire lower 1/3 rectum): Negative Mesenteric margin (sigmoid and transverse): Negative Distance closest margin (if all above margins negative): 1.0 cm (distal) Treatment effect (neo-adjuvant therapy): None Additional polyp(s): None Non-neoplastic findings: None Lymph nodes: number examined 14; number positive: 1 Pathologic Staging: pT1, pN1a, pMX Ancillary studies: Per the [place] Gastrointestinal Oncology Working group Guidelines, tumor will be submitted for both microsatellite instability by PCR and mismatch repair protein expression by immunohistochemistry. The results will be reported in an addendum. (CR:kh 07-08-15) Mali RUND DO Pathologist, Electronic Signature (Case signed 07/08/2015) Specimen Gross and Clinical Information 2 of 4 FINAL for AVON, MERGENTHALER A [phone]) Specimen(s) Obtained: 1. Colon, segmental resection for tumor,  rectosigmoid 2. Colon, resection margin (donut), final distal margin Specimen Clinical Information 1. rectal cancer (kp) Gross 1. Specimen: Rectosigmoid, to include at least middle third of rectum. Specimen integrity: Intact. Specimen length: 31 cm. Mesorectal intactness: Near complete. Tumor location: Middle third of rectum, posterior wall, 6.0 cm from the sigmorectal junction. Tumor size: 2.0 cm in diameter tan red indurated, sessile ill-defined mass. Percent of bowel circumference involved: Approximately 20%. Tumor distance to margins: Proximal: 28 cm. Distal: 1 cm. Mesenteric (sigmoid and transverse): 11 cm. Radial (posterior ascending, posterior descending; lateral and posterior mid-rectum; and entire lower 1/3 rectum): 0.5 cm to the mesorectal soft tissue margin. Macroscopic extent of tumor invasion: The tumor involves, but does not grossly completely transect the muscularis. Total presumed lymph nodes: 14 possible lymph nodes ranging from 0.1 to 0.8 cm. Extramural satellite  tumor nodules: None. Mucosal polyp(s): None. Additional findings: None. Block summary: A = shave of proximal margin B,C = shave of entire distal margin D-H = tumor, entirely submitted I = four nodes, whole J = four nodes, whole K = four nodes, whole L = two nodes, whole Total: 12 blocks submitted 2. Received in formalin is a 1.1 cm in diameter and 1.3 cm thick portion of tissue with tan to hyperemic, smooth mucosa on one end. Sectioned and entirely submitted in one block. (SW:ds 07/07/15) Stain(s) used in Diagnosis: The following stain(s) were used in diagnosing the case: MSH6, MLH1, MSH2, PMS2. The control(s) stained appropriately. Disclaimer Some of these immunohistochemical stains may have been developed and the performance characteristics determined by [facility]. Some may not have been cleared or approved by the U.S. Food and Drug Administration. The FDA has determined that such  clearance or approval is not necessary. This test is used for clinical purposes. It should not be regarded as investigational or for research. This laboratory is certified under the [place] (CLIA-88) as qualified to perform high complexity clinical laboratory testing. Report signed out from the following location(s) Technical Component was performed at [facility]. [address],[place] [address].[id number]., 3 of 4 FINAL for JONANTHAN, BOLENDER A [phone]) Report signed out from the following location(s)(continued) Technical Component was performed at [place], [place], [place] [address]. CLIA #: [id number], Technical component and interpretation was performed at [facility], [place], [place] [address]. CLIA #: [id number], 4 of 4   ASSESSMENT & PLAN:  T1, N1, M0 adenocarcinoma of the rectum, Stage IIIA Robotic assisted LAR with Dr. [name] Normal preoperative CEA Adjuvant FOLFOX  Adjuvant concurrent 5-FU/XRT   The patient is here to receive his final cycle of FOLFOX.   He will return to have his pump taken off on 02/15/16.  Previous rectal and bowels symptoms have resolved since completing radiation therapy.  I have referred the patient back to Dr. [name] at [place] for follow up, at the patient's request.  He will return for follow up in 2 weeks. At this next visit, we will discuss a referral to the survivorship program and future follow up scheduling. He will need a colonoscopy and repeat CT scans later this year.   He has done remarkably well with therapy.   All questions were answered. The patient knows to call the clinic with any problems, questions or concerns.  This document serves as a record of services personally performed by Ancil Linsey, MD. It was created on her behalf by Arlyce Harman, a trained medical scribe. The  creation of this record is based on the scribe's personal observations and the provider's statements to them. This document has been checked and approved by the attending provider.  I have reviewed the above documentation for accuracy and completeness, and I agree with the above.  This note was electronically signed.  Kelby Fam. Whitney Muse, MD

## 2016-02-13 NOTE — Progress Notes (Signed)
Tolerated well. Discharged with continuous infusion in progress as ordered.

## 2016-02-13 NOTE — Progress Notes (Signed)
Referral made to [facility].  With Dr [name].  appt 5/1 @330 

## 2016-02-13 NOTE — Patient Instructions (Signed)
[facility] at [facility] Discharge Instructions  RECOMMENDATIONS MADE BY THE CONSULTANT AND ANY TEST RESULTS WILL BE SENT TO YOUR REFERRING PHYSICIAN.   Exam and discussion by Dr Penland today  Return to see the doctor in Please call the clinic if you have any questions or concerns    Thank you for choosing [facility] at [facility] to provide your oncology and hematology care.  To afford each patient quality time with our provider, please arrive at least 15 minutes before your scheduled appointment time.   Beginning January 23rd 2017 lab work for the Cancer Center will be done in the  Main lab at [facility] on 1st floor. If you have a lab appointment with the Cancer Center please come in thru the  Main Entrance and check in at the main information desk  You need to re-schedule your appointment should you arrive 10 or more minutes late.  We strive to give you quality time with our providers, and arriving late affects you and other patients whose appointments are after yours.  Also, if you no show three or more times for appointments you may be dismissed from the clinic at the providers discretion.     Again, thank you for choosing [facility].  Our hope is that these requests will decrease the amount of time that you wait before being seen by our physicians.       _____________________________________________________________  Should you have questions after your visit to [facility], please contact our office at [phone] between the hours of 8:30 a.m. and 4:30 p.m.  Voicemails left after 4:30 p.m. will not be returned until the following business day.  For prescription refill requests, have your pharmacy contact our office.         Resources For Cancer Patients and their Caregivers ? American Cancer Society: Can assist with transportation, wigs, general needs, runs Look Good Feel Better.         1-888-227-6333 ? Cancer Care: Provides financial assistance, online support groups, medication/co-pay assistance.  1-800-813-HOPE (4673) ? [facility] Assists [place] cancer patients and their families through emotional , educational and financial support.  336-427-4357 ? [place] DSS Where to apply for food stamps, Medicaid and utility assistance. 336-342-1394 ? RCATS: Transportation to medical appointments. 336-347-2287 ? Social Security Administration: May apply for disability if have a Stage IV cancer. 336-342-7796 1-800-772-1213 ? [place] Aging, Disability and Transit Services: Assists with nutrition, care and transit needs. 336-349-2343  

## 2016-02-14 LAB — CEA: CEA: 5 ng/mL — ABNORMAL HIGH (ref 0.0–4.7)

## 2016-02-15 ENCOUNTER — Encounter (HOSPITAL_BASED_OUTPATIENT_CLINIC_OR_DEPARTMENT_OTHER): Payer: Commercial Managed Care - HMO

## 2016-02-15 VITALS — BP 122/78 | HR 97 | Temp 97.7°F | Resp 16

## 2016-02-15 DIAGNOSIS — C20 Malignant neoplasm of rectum: Secondary | ICD-10-CM | POA: Diagnosis not present

## 2016-02-15 MED ORDER — HEPARIN SOD (PORK) LOCK FLUSH 100 UNIT/ML IV SOLN
500.0000 [IU] | Freq: Once | INTRAVENOUS | Status: AC | PRN
  Administered 2016-02-15: 500 [IU]

## 2016-02-15 MED ORDER — HEPARIN SOD (PORK) LOCK FLUSH 100 UNIT/ML IV SOLN
INTRAVENOUS | Status: AC
  Filled 2016-02-15: qty 5

## 2016-02-15 MED ORDER — SODIUM CHLORIDE 0.9% FLUSH
10.0000 mL | INTRAVENOUS | Status: DC | PRN
  Administered 2016-02-15: 10 mL
  Filled 2016-02-15: qty 10

## 2016-02-15 NOTE — Progress Notes (Signed)
Patient arrives for port flush and home infusion pump removal.  Pump removed and port flushed with 43ml normal saline and 500 units of heparin.  VSS.  No c/o side effects or concerns.

## 2016-02-15 NOTE — Patient Instructions (Signed)
[place] at [facility] Discharge Instructions  RECOMMENDATIONS MADE BY THE CONSULTANT AND ANY TEST RESULTS WILL BE SENT TO YOUR REFERRING PHYSICIAN.  Home infusion pump removed today.  Last treatment!  Thank you for choosing [place] at [facility] to provide your oncology and hematology care.  To afford each patient quality time with our provider, please arrive at least 15 minutes before your scheduled appointment time.   Beginning January 23rd 2017 lab work for the [facility] will be done in the  Main lab at [facility] on 1st floor. If you have a lab appointment with the [facility] please come in thru the  Main Entrance and check in at the main information desk  You need to re-schedule your appointment should you arrive 10 or more minutes late.  We strive to give you quality time with our providers, and arriving late affects you and other patients whose appointments are after yours.  Also, if you no show three or more times for appointments you may be dismissed from the clinic at the providers discretion.     Again, thank you for choosing [facility].  Our hope is that these requests will decrease the amount of time that you wait before being seen by our physicians.       _____________________________________________________________  Should you have questions after your visit to [facility], please contact our office at [phone] between the hours of 8:30 a.m. and 4:30 p.m.  Voicemails left after 4:30 p.m. will not be returned until the following business day.  For prescription refill requests, have your pharmacy contact our office.         Resources For Cancer Patients and their Caregivers ? American Cancer Society: Can assist with transportation, wigs, general needs, runs Look Good Feel Better.        385-834-6355 ? Cancer Care: Provides financial assistance, online support groups,  medication/co-pay assistance.  1-800-813-HOPE 276-878-3739) ? [facility] Assists [place] cancer patients and their families through emotional , educational and financial support.  979-113-9904 ? [place] DSS Where to apply for food stamps, Medicaid and utility assistance. 216-554-3112 ? RCATS: Transportation to medical appointments. 908-639-7143 ? Social Security Administration: May apply for disability if have a Stage IV cancer. ([phone] 418-136-7420 ? [employer], Disability and Transit Services: Assists with nutrition, care and transit needs. (201)458-7589

## 2016-02-22 ENCOUNTER — Other Ambulatory Visit: Payer: Self-pay | Admitting: Licensed Clinical Social Worker

## 2016-02-22 NOTE — Patient Outreach (Signed)
Assessment:  CSW called client on 02/22/16 and spoke via phone with client. CSW verified client identity. CSW and client spoke of client needs. Client said he has completed all his scheduled radiation and chemotherapy treatments. He said he had an appointment with Dr. Legrand Rams last week. He said he is trying to eat adequately. He said he is sleeping adequately.  He uses a cane to help him ambulate.  CSW and client spoke of client care plan. CSW encouraged client to collect needed financial documents for Medicaid application. Client said he has collected most documents needed for Medicaid application and is hoping to go soon to [place] to begin [place] application . CSW encouraged client to take financial documents collected and to meet with Medicaid caseworker at local [place] to begin [facility] application for client. .  Client  receives Food Stamps benefit.  Client said he drives his own Lucianne Lei to and from scheduled medical appointments. He has family support from his son, [name], [name].  He has friends who visit him or call periodically.  CSW thanked client for phone call with CSW on 02/22/16.  CSW encouraged [name] to call CSW at [phone] as needed to discuss social work needs of client.   Plan:  Client will gather financial documents needed for Medicaid application and will apply for Medicaid in the next 30 days. CSW will call client in 3 weeks to assess needs of client at that time.   [name] MSW, LCSW Licensed Clinical Social Worker [facility] Care Management [phone]

## 2016-02-23 ENCOUNTER — Encounter (HOSPITAL_COMMUNITY): Payer: Self-pay | Admitting: Hematology & Oncology

## 2016-02-23 ENCOUNTER — Encounter (HOSPITAL_COMMUNITY): Payer: Commercial Managed Care - HMO | Attending: Hematology & Oncology | Admitting: Hematology & Oncology

## 2016-02-23 VITALS — BP 123/80 | HR 100 | Temp 98.0°F | Resp 20 | Wt 197.0 lb

## 2016-02-23 DIAGNOSIS — Z923 Personal history of irradiation: Secondary | ICD-10-CM | POA: Diagnosis not present

## 2016-02-23 DIAGNOSIS — D61810 Antineoplastic chemotherapy induced pancytopenia: Secondary | ICD-10-CM

## 2016-02-23 DIAGNOSIS — C20 Malignant neoplasm of rectum: Secondary | ICD-10-CM | POA: Insufficient documentation

## 2016-02-23 DIAGNOSIS — D61818 Other pancytopenia: Secondary | ICD-10-CM | POA: Diagnosis not present

## 2016-02-23 DIAGNOSIS — T451X5A Adverse effect of antineoplastic and immunosuppressive drugs, initial encounter: Secondary | ICD-10-CM

## 2016-02-23 MED ORDER — HYDROCODONE-ACETAMINOPHEN 7.5-325 MG PO TABS: 1.0000 | ORAL_TABLET | Freq: Two times a day (BID) | ORAL | Status: DC | PRN

## 2016-02-23 MED ORDER — TRAMADOL HCL 50 MG PO TABS: ORAL_TABLET | ORAL | Status: DC

## 2016-02-23 NOTE — Patient Instructions (Addendum)
[place] at [facility] Discharge Instructions  RECOMMENDATIONS MADE BY THE CONSULTANT AND ANY TEST RESULTS WILL BE SENT TO YOUR REFERRING PHYSICIAN.    Exam and discussion by Dr Whitney Muse today No scans due at this time Labs in 2 months Wrote a prescription for refills on pain medicine today but cannot have them filled till 03-01-16. Will refer you to survivorship clinic. May need genetics testing, will let you know. We are going to move your appointments out a little Will fax paper work to the appropriate person Return to see the doctor in 2 months Please call the clinic if you have any questions or concerns     Thank you for choosing [place] at [facility] to provide your oncology and hematology care.  To afford each patient quality time with our provider, please arrive at least 15 minutes before your scheduled appointment time.   Beginning January 23rd 2017 lab work for the [facility] will be done in the  Main lab at [facility] on 1st floor. If you have a lab appointment with the [facility] please come in thru the  Main Entrance and check in at the main information desk  You need to re-schedule your appointment should you arrive 10 or more minutes late.  We strive to give you quality time with our providers, and arriving late affects you and other patients whose appointments are after yours.  Also, if you no show three or more times for appointments you may be dismissed from the clinic at the providers discretion.     Again, thank you for choosing [facility].  Our hope is that these requests will decrease the amount of time that you wait before being seen by our physicians.       _____________________________________________________________  Should you have questions after your visit to [facility], please contact our office at [phone] between the hours of 8:30 a.m. and 4:30 p.m.  Voicemails  left after 4:30 p.m. will not be returned until the following business day.  For prescription refill requests, have your pharmacy contact our office.         Resources For Cancer Patients and their Caregivers ? American Cancer Society: Can assist with transportation, wigs, general needs, runs Look Good Feel Better.        712 318 3752 ? Cancer Care: Provides financial assistance, online support groups, medication/co-pay assistance.  1-800-813-HOPE 902-268-5635) ? [place] Assists [place] cancer patients and their families through emotional , educational and financial support.  808-679-6686 ? [place] DSS Where to apply for food stamps, Medicaid and utility assistance. (431)198-0030 ? [facility]: Transportation to medical appointments. (959)621-2887 ? Social Security Administration: May apply for disability if have a Stage IV cancer. 817-130-1541 (807)128-4883 ? [employer], Disability and Transit Services: Assists with nutrition, care and transit needs. [phone]

## 2016-02-23 NOTE — Progress Notes (Signed)
[place] at [facility] Progress Note  Patient Care Team: Rosita Fire, MD as PCP - General (Internal Medicine) Daneil Dolin, MD as Consulting Physician (Gastroenterology) Katha Cabal, LCSW as [facility] Management (Licensed Clinical Social Worker)  CHIEF COMPLAINTS/PURPOSE OF CONSULTATION:  Stage IIIA Rectal carcinoma Robotic assisted LAR, rigid proctoscopy with Dr. [name] 05/07/5092 Lymph nodes: number examined 14; number positive: 1 Pathologic Staging: pT1, pN1a, pMX Normal preoperative CEA Adjuvant FOLFOX Plans for concurrent XRT/5-FU  HISTORY OF PRESENTING ILLNESS:  [name] 55 y.o. male is here for follow-up of his rectal cancer. He has undergone definitive surgical therapy and did remarkably well. Unfortunately he did have one lymph node that contained metastatic disease. CT imaging prior to surgery revealed no evidence of metastatic disease. He has completed all recommended all recommended therapy.   Mr. Malinowski was here with his older brother today.   He has a loose tooth that he cannot get out. He does not want to go to the dentist for this.  He wants to go to the survivorship clinic.  He will have to have CT scans periodically, and will eventually have a colonoscopy done.   He will return for a follow up and blood work in 2 months.   He has no other concerns or problems at this time. He notes his bowels are ok. Appetite is good.    MEDICAL HISTORY:  Past Medical History  Diagnosis Date  . Hypertension   . High cholesterol   . Sleep apnea   . Rectal cancer ([place])   . Depression   . Arthritis   . Diabetes mellitus     "Borderline"  . GERD (gastroesophageal reflux disease)     No weakness  . Neuropathy ([place])     "back missed up"  . Cancer [facility])     rectal    SURGICAL HISTORY: Past Surgical History  Procedure Laterality Date  . Back surgery    . Cholecystectomy    . Colonoscopy with propofol N/A  03/31/2015    Procedure: COLONOSCOPY WITH PROPOFOL at cecum 0842; withdrawal time=78mnutes;  Surgeon: RDaneil Dolin MD;  Location: [facility] ORS;  Service: Endoscopy;  Laterality: N/A;  . Polypectomy  03/31/2015    Procedure: POLYPECTOMY;  Surgeon: RDaneil Dolin MD;  Location: [facility] ORS;  Service: Endoscopy;;  . Biopsy  03/31/2015    Procedure: BIOPSY;  Surgeon: RDaneil Dolin MD;  Location: [facility] ORS;  Service: Endoscopy;;  . Flexible sigmoidoscopy N/A 07/05/2015    Procedure: FLEXIBLE SIGMOIDOSCOPY;  Surgeon: ALeighton Ruff MD;  Location: [facility] ENDOSCOPY;  Service: Endoscopy;  Laterality: N/A;  with tattoo  . Xi robotic assisted lower anterior resection N/A 07/06/2015    Procedure: XI ROBOTIC ASSISTED LOWER ANTERIOR RESECTION, rigid proctoscopy;  Surgeon: ALeighton Ruff MD;  Location: [facility];  Service: General;  Laterality: N/A;  . Portacath placement N/A 08/09/2015    Procedure: INSERTION PORT-A-CATH LEFT SUBCLAVIAN;  Surgeon: ALeighton Ruff MD;  [facility]  Service: General;  Laterality: N/A;    SOCIAL HISTORY: Social History   Social History  . Marital Status: Legally Separated    Spouse Name: N/A  . Number of Children: N/A  . Years of Education: N/A   Occupational History  . Not on file.   Social History Main Topics  . Smoking status: Former Smoker    Quit date: 03/23/2006  . Smokeless tobacco: Never Used  . Alcohol Use: No  . Drug  Use: No  . Sexual Activity: Not Currently   Other Topics Concern  . 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 Colon cancer Neg Hx     "Not that I know of"  . Colon polyps Neg Hx     "not that I know of"  . Hypertension      "Not sure who, I don't know much about my family"  . Diabetes      "Not sure who, I don't know much about my family"   has  no family status information on file.  Mother deceased, 81, liver cancer Father living, 66 3 brothers 1 sister  ALLERGIES:  has No Known Allergies.  MEDICATIONS:  Current Outpatient Prescriptions  Medication Sig Dispense Refill  . ACCU-CHEK SMARTVIEW test strip     . amLODipine (NORVASC) 5 MG tablet Take 1 tablet (5 mg total) by mouth every morning. 30 tablet 2  . dextrose 5 % SOLN 1,000 mL with fluorouracil 5 GM/100ML SOLN Inject into the vein. To be given every 14 days x 12 cycles. To run for 46 hours. Has not started yet.    . docusate sodium (COLACE) 100 MG capsule Take 100 mg by mouth daily.    Marland Kitchen gabapentin (NEURONTIN) 800 MG tablet Take 1 tablet (800 mg total) by mouth 3 (three) times daily. 90 tablet 2  . [START ON 03/01/2016] HYDROcodone-acetaminophen (NORCO) 7.5-325 MG tablet Take 1 tablet by mouth every 12 (twelve) hours as needed for moderate pain. 45 tablet 0  . LEUCOVORIN CALCIUM IJ Inject as directed. To be given every 14 days x 12 cycles. Has not started yet.    . lidocaine-prilocaine (EMLA) cream Apply a quarter size amount to port site 1 hour prior to chemo. Do not rub in. Cover with plastic wrap. 30 g 3  . lisinopril (PRINIVIL,ZESTRIL) 40 MG tablet Take 1 tablet (40 mg total) by mouth every morning. 30 tablet 2  . loperamide (IMODIUM A-D) 2 MG tablet Take 2 mg by mouth as needed for diarrhea or loose stools.    . NON FORMULARY PT HAS A C-PAP MACHINE    . nortriptyline (PAMELOR) 75 MG capsule Take 1 capsule (75 mg total) by mouth at bedtime. 30 capsule 2  . OXALIPLATIN IV Inject into the vein. Reported on 12/26/2015    . polyethylene glycol powder (GLYCOLAX/MIRALAX) powder Take 1 Container by mouth once.    Marland Kitchen tiZANidine (ZANAFLEX) 2 MG tablet Take 1 tablet (2 mg total) by mouth every 8 (eight) hours as needed for muscle spasms. 30 tablet 2  . [START ON 03/01/2016] traMADol (ULTRAM) 50 MG tablet May take 1-2 tabs every 6 hours as needed for pain 120 tablet 0  . [DISCONTINUED]  prochlorperazine (COMPAZINE) 10 MG tablet Take 1 tablet (10 mg total) by mouth every 6 (six) hours as needed (Nausea or vomiting). (Patient not taking: Reported on 10/06/2015) 30 tablet 1   No current facility-administered medications for this visit.    Review of Systems  Constitutional: Negative for fever, chills, weight loss.  HENT: Negative for congestion, hearing loss, nosebleeds, sore throat and tinnitus.   Eyes: Negative for blurred vision, double vision, pain and discharge.  Respiratory: Negative for cough, hemoptysis, sputum production, shortness of breath and wheezing.   Cardiovascular: Negative for  chest pain, palpitations, claudication, leg swelling and PND.  Gastrointestinal:  Negative for heartburn, nausea, vomiting, abdominal pain, diarrhea, and melena.  Genitourinary: Negative for dysuria, urgency, frequency and hematuria.  Musculoskeletal: Negative for myalgias, joint pain and falls.  Skin: Negative for itching and rash.  Neurological: Negative for dizziness, tingling, tremors, sensory change, speech change, focal weakness, seizures, loss of consciousness, weakness and headaches.  Endo/Heme/Allergies: Does not bruise/bleed easily.  Psychiatric/Behavioral: Negative for depression, suicidal ideas, memory loss and substance abuse. The patient is not nervous/anxious and does not have insomnia.   All other systems reviewed and are negative.  14 point ROS was done and is otherwise as detailed above or in HPI   Physical Exam   Vitals with BMI 02/23/2016  Height   Weight 197 lbs  BMI   Systolic 403  Diastolic 80  Pulse 474  Respirations 20    Constitutional: He is oriented to person, place, and time and well-developed, well-nourished, and in no distress.  HENT:  Head: Normocephalic and atraumatic.  Nose: Nose normal.  Mouth/Throat: Oropharynx is clear and moist. No oropharyngeal exudate.  Eyes: Conjunctivae and EOM are normal. Pupils are equal, round, and reactive to  light. Right eye exhibits no discharge. Left eye exhibits no discharge. No scleral icterus.  Neck: Normal range of motion. Neck supple. No tracheal deviation present. No thyromegaly present.  Cardiovascular: Normal rate, regular rhythm and normal heart sounds.  Exam reveals no gallop and no friction rub.   No murmur heard. Pulmonary/Chest: Effort normal and breath sounds normal. He has no wheezes. He has no rales.  Abdominal: Soft. Bowel sounds are normal. He exhibits no distension and no mass. There is no tenderness. There is no rebound and no guarding.  well-healing surgical incision sites  Genitourinary:  Musculoskeletal: Normal range of motion. He exhibits no edema.  Lymphadenopathy:    He has no cervical adenopathy.  Neurological: He is alert and oriented to person, place, and time. He has normal reflexes. No cranial nerve deficit. Gait normal. Coordination normal.  Skin: Skin is warm and dry. No rash noted.  Psychiatric: Mood, memory, affect and judgment normal.  Nursing note and vitals reviewed.   LABORATORY DATA:  I have reviewed the data as listed CBC    Component Value Date/Time   WBC 3.5* 02/13/2016 0846   RBC 4.18* 02/13/2016 0846   HGB 12.8* 02/13/2016 0846   HCT 38.2* 02/13/2016 0846   PLT 133* 02/13/2016 0846   MCV 91.4 02/13/2016 0846   MCH 30.6 02/13/2016 0846   MCHC 33.5 02/13/2016 0846   RDW 13.5 02/13/2016 0846   LYMPHSABS 0.4* 02/13/2016 0846   MONOABS 0.5 02/13/2016 0846   EOSABS 0.3 02/13/2016 0846   BASOSABS 0.0 02/13/2016 0846    CMP     Component Value Date/Time   NA 141 02/13/2016 0846   K 3.8 02/13/2016 0846   CL 109 02/13/2016 0846   CO2 24 02/13/2016 0846   GLUCOSE 108* 02/13/2016 0846   BUN 16 02/13/2016 0846   CREATININE 1.02 02/13/2016 0846   CALCIUM 9.4 02/13/2016 0846   PROT 8.0 02/13/2016 0846   ALBUMIN 4.1 02/13/2016 0846   AST 25 02/13/2016 0846   ALT 22 02/13/2016 0846   ALKPHOS 78 02/13/2016 0846   BILITOT 0.6 02/13/2016 0846    GFRNONAA >60 02/13/2016 0846   GFRAA >60 02/13/2016 0846    PATHOLOGY:REPORT OF SURGICAL PATHOLOGY ADDITIONAL INFORMATION: 1. Mismatch Repair (MMR) Protein Immunohistochemistry (IHC) IHC Expression Result: MLH1: Preserved nuclear  expression (greater 50% tumor expression) MSH2: Preserved nuclear expression (greater 50% tumor expression) MSH6: Preserved nuclear expression (greater 50% tumor expression) PMS2: Preserved nuclear expression (greater 50% tumor expression) * Internal control demonstrates intact nuclear expression Interpretation: NORMAL There is preserved expression of the major and minor MMR proteins. There is a very low probability that microsatellite instability (MSI) is present. However, certain clinically significant MMR protein mutations may result in preservation of nuclear expression. It is recommended that the preservation of protein expression be correlated with molecular based MSI testing. References: 1. Guidelines on Genetic Evaluation and Management of Lynch Syndrome: A Consensus Statement by the [place] Multi-Society Task Force on Colorectal Cancer Gae Dry. Sherlie Ban , MD, and others . Am Nicki [place] 2014; [phone]; doi: 10.1038/ajg.2014.186; published online 09 June 2013 2. Outcomes of screening endometrial cancer patients for Lynch syndrome by patient-administered checklist. Olena Heckle MS, and others. Gynecol Oncol 2013;131(3):619-623. [place] RUND DO Pathologist, Electronic Signature ( Signed 07/11/2015) FINAL DIAGNOSIS Diagnosis 1. Colon, segmental resection for tumor, rectosigmoid - INVASIVE ADENOCARCINOMA, SEE COMMENT. - TUMOR INVADES INTO SUBMUCOSA. 1 of 4 FINAL for WILLFORD, RABIDEAU A [phone]) Diagnosis(continued) - ONE LYMPH NODE, POSITIVE FOR METASTATIC TUMOR (1/14). - SURGICAL MARGINS, NEGATIVE FOR TUMOR. - SEE TUMOR SYNOPTIC TEMPLATE BELOW. 2. Colon, resection margin (donut), final distal margin - BENIGN COLON. - NEGATIVE FOR DYSPLASIA OR  MALIGNANCY. Microscopic Comment 1. COLON AND RECTUM (INCLUDING TRANS-ANAL RESECTION): Specimen: Rectosigmoid Procedure: Resection Tumor site: Mid rectum Specimen integrity: Intact Macroscopic intactness of mesorectum: Near complete Macroscopic tumor perforation: Absent Invasive tumor: Maximum size: 2.0 cm Histologic type(s): Adenocarcinoma Histologic grade and differentiation: G2: moderately differentiated/low grade Type of polyp in which invasive carcinoma arose: Adenoma with high grade dysplasia Microscopic extension of invasive tumor: Tumor extends into submucosa and is immediately adjacent to muscularis propria. Lymph-Vascular invasion: Present Peri-neural invasion: Present Tumor deposit(s) (discontinuous extramural extension): Absent Resection margins: Proximal margin: Negative Distal margin: Negative Circumferential (radial) (posterior ascending, posterior descending; lateral and posterior mid-rectum; and entire lower 1/3 rectum): Negative Mesenteric margin (sigmoid and transverse): Negative Distance closest margin (if all above margins negative): 1.0 cm (distal) Treatment effect (neo-adjuvant therapy): None Additional polyp(s): None Non-neoplastic findings: None Lymph nodes: number examined 14; number positive: 1 Pathologic Staging: pT1, pN1a, pMX Ancillary studies: Per the  Gastrointestinal Oncology Working group Guidelines, tumor will be submitted for both microsatellite instability by PCR and mismatch repair protein expression by immunohistochemistry. The results will be reported in an addendum. (CR:kh 07-08-15) Mali RUND DO Pathologist, Electronic Signature (Case signed 07/08/2015) Specimen Gross and Clinical Information 2 of 4 FINAL for STEFANOS, HAYNESWORTH A [phone]) Specimen(s) Obtained: 1. Colon, segmental resection for tumor, rectosigmoid 2. Colon, resection margin (donut), final distal margin Specimen Clinical Information 1. rectal cancer  (kp) Gross 1. Specimen: Rectosigmoid, to include at least middle third of rectum. Specimen integrity: Intact. Specimen length: 31 cm. Mesorectal intactness: Near complete. Tumor location: Middle third of rectum, posterior wall, 6.0 cm from the sigmorectal junction. Tumor size: 2.0 cm in diameter tan red indurated, sessile ill-defined mass. Percent of bowel circumference involved: Approximately 20%. Tumor distance to margins: Proximal: 28 cm. Distal: 1 cm. Mesenteric (sigmoid and transverse): 11 cm. Radial (posterior ascending, posterior descending; lateral and posterior mid-rectum; and entire lower 1/3 rectum): 0.5 cm to the mesorectal soft tissue margin. Macroscopic extent of tumor invasion: The tumor involves, but does not grossly completely transect the muscularis. Total presumed lymph nodes: 14 possible lymph nodes ranging from 0.1 to 0.8 cm. Extramural satellite tumor nodules: None.  Mucosal polyp(s): None. Additional findings: None. Block summary: A = shave of proximal margin B,C = shave of entire distal margin D-H = tumor, entirely submitted I = four nodes, whole J = four nodes, whole K = four nodes, whole L = two nodes, whole Total: 12 blocks submitted 2. Received in formalin is a 1.1 cm in diameter and 1.3 cm thick portion of tissue with tan to hyperemic, smooth mucosa on one end. Sectioned and entirely submitted in one block. (SW:ds 07/07/15) Stain(s) used in Diagnosis: The following stain(s) were used in diagnosing the case: MSH6, MLH1, MSH2, PMS2. The control(s) stained appropriately. Disclaimer Some of these immunohistochemical stains may have been developed and the performance characteristics determined by [facility]. Some may not have been cleared or approved by the U.S. Food and Drug Administration. The FDA has determined that such clearance or approval is not necessary. This test is used for clinical purposes. It should not be regarded  as investigational or for research. This laboratory is certified under the [place] (CLIA-88) as qualified to perform high complexity clinical laboratory testing. Report signed out from the following location(s) Technical Component was performed at [facility]. [address],[place] [address].KNLZ:[id number]., 3 of 4 FINAL for ZACARIAH, BELUE A [phone]) Report signed out from the following location(s)(continued) Technical Component was performed at [place], [place], [place] [address]. CLIA #: [id number], Technical component and interpretation was performed at [address], [place], [place] [address]. CLIA #: [id number], 4 of 4   ASSESSMENT & PLAN:  T1, N1, M0 adenocarcinoma of the rectum, Stage IIIA Robotic assisted LAR with Dr. [name] Normal preoperative CEA Adjuvant FOLFOX  Adjuvant concurrent 5-FU/XRT Pancytopenia (? Post treatment related)  He has finished all therapy. He is interested in the survivorship clinic.   I reviewed NCCN guidelines regarding follow-up. Pre-op CEA was not elevated. I am not sure of the significance of recent CEA but we will monitor.   NCCN guidelines for surveillance for Colon cancer are as follows (1.2017): B. Stage II, Stage III 1. H+P every 3-6 months x 2 years and then every 6 months for a total of 5 years  2. CEA every 3-6 months x 2 years and then every 6 months for a total of 5 years  3. CT CAP every 6-12 months (category 2B for frequency < 12 months) for a total of 5 years . 4.  Colonoscopy in 1 year except if no preoperative colonoscopy due to obstructing lesion, colonoscopy in 3-6 months.  A. If advanced adenoma, repeat in 1 year B. If no advanced adenoma, repeat in 3 years, then every 5 years 5. PET/CT scan is not recommended.   He has no other concerns or problems at this time. Counts  will continue to be monitored moving forward as well.   He will return for a follow up and blood work in 2 months.   All questions were answered. The patient knows to call the clinic with any problems, questions or concerns.  This document serves as a record of services personally performed by Ancil Linsey, MD. It was created on her behalf by Kandace Blitz, a trained medical scribe. The creation of this record is based on the scribe's personal observations and the provider's statements to them. This document has been checked and approved by the attending provider.  I have reviewed the above documentation for accuracy and completeness, and I  agree with the above.  This note was electronically signed.  [place] Fam. Whitney Muse, MD

## 2016-03-14 ENCOUNTER — Other Ambulatory Visit: Payer: Self-pay | Admitting: Licensed Clinical Social Worker

## 2016-03-14 NOTE — Patient Outreach (Signed)
Assessment:  CSW spoke via phone with client on 03/14/16. CSW verified client identity. CSW and client spoke of client needs.  Client said he is driving his Lucianne Lei to and from scheduled medical appointments.  Client said he has his prescribed medications and is taking medications as prescribed. He has some support from his son.  His son helps client with food procurement and sometimes goes with client to scheduled client medical appointments.  Client uses a cane to help him ambulate. He said he had completed all his chemotherapy and radiation treatments.  He said he plans to participate in survivorship clinic and will go to his first meeting next month.  He said meeting with survivorship clinic will be next month at [facility]. He said he is sleeping adequately.  He said he is eating well.  He sees Dr. Legrand Rams as his primary physician.  He said he will have appointment with Dr. Legrand Rams in May of 2017. CSW and client spoke of client care plan. CSW encouraged client to gather documents for Medicaid application and apply for Medicaid. Client said he had obtained copy of Medicaid application and plans to complete Medicaid application soon and turn in this application at [employer] in [place]. He said he receives Food Stamps benefit. He said Food Stamps benefit is helpful to him each month.  He said he goes to local [employer] monthly for food assistance.  CSW thanked client for phone call with CSW on 03/14/16. CSW encouraged [name] to call CSW at [phone] as needed to discuss social work needs of client.  Plan:  Client to gather financial documents needed for Medicaid application and client to apply for Medicaid in next 30 days CSW to call client in 4 weeks to assess needs of client.   [name].[name] MSW, LCSW Licensed Clinical Social Worker [name] Care Management [phone]

## 2016-03-19 DIAGNOSIS — Z85048 Personal history of other malignant neoplasm of rectum, rectosigmoid junction, and anus: Secondary | ICD-10-CM | POA: Diagnosis not present

## 2016-03-25 ENCOUNTER — Encounter (HOSPITAL_COMMUNITY): Payer: Self-pay | Admitting: Hematology & Oncology

## 2016-03-26 ENCOUNTER — Other Ambulatory Visit (HOSPITAL_COMMUNITY): Payer: Self-pay | Admitting: Oncology

## 2016-03-26 DIAGNOSIS — C20 Malignant neoplasm of rectum: Secondary | ICD-10-CM

## 2016-03-26 MED ORDER — HYDROCODONE-ACETAMINOPHEN 7.5-325 MG PO TABS: 1.0000 | ORAL_TABLET | Freq: Two times a day (BID) | ORAL | Status: DC | PRN

## 2016-03-26 MED ORDER — TRAMADOL HCL 50 MG PO TABS: ORAL_TABLET | ORAL | Status: DC

## 2016-04-03 ENCOUNTER — Ambulatory Visit (HOSPITAL_COMMUNITY): Payer: Commercial Managed Care - HMO | Admitting: Adult Health

## 2016-04-09 ENCOUNTER — Encounter: Payer: Self-pay | Admitting: Adult Health

## 2016-04-09 NOTE — Progress Notes (Unsigned)
Rectal Cancer Treatment Summary & Survivorship Care Plan Provided by Mike Craze, NP on 04/10/2016    General Information  Patient Name [name]   Patient ID [id number]   Date of Birth 1960-12-10     Your Care Team   Rosita Fire, MD as PCP - General (Internal Medicine) Daneil Dolin, MD as Consulting Physician (Gastroenterology) Katha Cabal, LCSW as [place] Management (Licensed Clinical Social Worker) [name], MD - Surgeon Ancil Linsey, MD - Medical Oncologist Kirby Crigler, [place], Medical Oncology Kyung Rudd, MD - Radiation Oncologist Loren Racer, [place] Worker Mike Craze, NP - Survivorship Nurse Practitioner   *To reach your [facility] providers, call [phone].   Cancer Diagnosis Information  Diagnosis Rectal cancer  Diagnosis Date 03/31/15  Age at Diagnosis 55 years old  Pathology Results Invasive adenocarcinoma  Depth of Invasion Submucosa  Pre-Treatment CEA 4.3 on 06/02/15  Staging  Rectal cancer [facility])   Staging form: Colon and Rectum, AJCC 7th Edition  Clinical stage: Stage IIIA (T1, N1a, M0)   Pathologic stage: Stage IIIA (T1, N1a, cM0)      Smoking status Former smoker  Family History No family history of colon cancer or colon polyps.        Treatment Summary    Rectal cancer ([place])   03/31/2015 Initial Biopsy Rectum, biopsy, rectal mass - INVASIVE ADENOCARCINOMA   06/02/2015 Tumor Marker Results for ROBERTSON, COLCLOUGH (MRN [id number]) as of 08/28/2015 09:09  06/02/2015 16:00 CEA: 4.3    06/08/2015 Imaging CT abd/pelvis-Rectal primary, without evidence of metastatic disease or acute complication.   06/24/2015 Imaging CT chest- No specific features identified to suggest metastatic disease to the chest.   07/06/2015 Definitive Surgery Robotic-assisted lower anterior resrction, rigid proctoscopy [name])- INVASIVE ADENOCARCINOMA, 2 cm, TUMOR INVADES INTO SUBMUCOSA. LVI/PNI  (+).  1/14 LN (+). Margins neg.    07/06/2015 Miscellaneous IHC Tumor Expression Results normal.  Negative for MLH1, MSH2, MSH6, & PMS2.    07/06/2015 Pathologic Stage pT1, pN1a, pMx   08/15/2015 - 11/16/2015 Chemotherapy FOLFOX x 6 cycles (adjuvant chemo "phase 1")   09/26/2015 Treatment Plan Change Treatment held today.  5FU bolus D/C'd.  Neulasta OnPro added to each subsequent cycles of therapy.   10/04/2015 Treatment Plan Change Neulasta onpro added to all subsequent cycles of treatment.   10/18/2015 Treatment Plan Change Tx held for thrombocytopenia.  5FU CI is reduced by 20% for subsequent treatments.   12/05/2015 - 01/16/2016 Chemotherapy 5FU continuous infusion weekly x 6 cycles; concurrent radiation. (adjuvant chemo "phase 2")   12/05/2015 - 01/18/2016 Radiation Therapy Pelvic radiation completed in [facility]).  Pelvis 45 Gy in 25 fractions.  Then cone down boost treatment for additional 5.4 Gy in 8 fractions.  Total dose: 50.4 Gy in 33 treatments   01/30/2016 - 02/13/2016 Chemotherapy FOLFOX x 2 cycles to complete 6 months of adjuvant chemotherapy.  (adjuvant chemo "phase 3")      Treatment Goal Curative      Your cancer treatment included surgery, chemotherapy, and radiation therapy.        Surgery:  Definitive Surgery Date 07/06/15  Surgery Type Robotic-assisted lower anterior resection, rigid proctoscopy  Lymph Node Sampling Yes  Number of Lymph Nodes Removed 14  Number of Positive Lymph Nodes 1   Side Effects of Surgery  Possible Long-Term: Scars Chronic pain Urinary Incontinence  Possible Late-Term (5 or more years after treatment): Lymphedema or swelling of  the legs                                     Chemotherapy: Chemotherapy given with Radiation Therapy Initially received chemo alone, then received chemo with radiation therapy   Drugs Received Date Started Date Completed # cycles (number of times drug was received) Notes  '[]'$    5-Fluorouracil (5-FU)/Leucovorin   08/15/15  11/16/15 6 After Cycle #3, there were reductions on chemo doses due to your low blood counts.   '[]'$   Oxaliplatin  08/15/15  11/16/15 6 After Cycle #3, there were reductions on chemo doses due to your low blood counts.  '[]'$   5-FU alone  12/05/15  01/16/16 6 Given with radiation therapy  '[]'$   5-Fluorouracil (5-FU)/Leucovorin  01/30/16  02/13/16  2 Given to complete 6 months of chemo after surgery  '[]'$   Oxaliplatin 01/30/16  02/13/16 2 Given to complete 6 months of chemo after surgery    Total 5-FU: 14 doses Total Oxaliplatin: 8 doses   Side Effects of Chemotherapy/Biotherapy  Possible Long-Term: Fatigue Peripheral neuropathy (numbness/tingling in hands/feet) Cognitive dysfunction Heart failure Kidney failure Infertility Liver problems  Possible Late-Term (five or more years after treatment): Cataracts Infertility Liver problems Lung disease Osteoporosis Reduced lung capacity Secondary primary cancer         Radiation Therapy:  Type of Radiation External beam radiation   Location of Radiation Pelvis  Dates of Radiation Start Date: 12/05/15             End Date: 01/18/16  Number of Radiation Treatments Tumor bed: 25 treatments Boost: 8 treatments Total: 33 treatments   Total Radiation Dose Tumor bed: 45 Gy Boost: 5.4 Gy Total: 50.4 Gy    Side Effects of Radiation  Possible Long-Term Fatigue Skin irritation/discoloration   Possible Late-Term (five or more years after treatment) Skin changes (including discoloration to the treated area) Rectal bleeding Formation of scar tissue Damage to any normal tissues in the irradiated field Secondary cancers New primary cancers  Whether you experience late side effects will depend on: -The part of your body that was treated  -The dose and length of your radiation therapy  -And if you received chemotherapy before, during, or after radiation therapy .             Follow-up and  Survivorship Care  Becoming a cancer survivor is a time of great celebration, but also a time of many questions and concerns.  The following information and resources are for you, your caregivers, and your primary care doctor to better understand the needs of a cancer survivor.      Surveillance for Rectal Cancer  Time Intervals for Appointments  Provider Physical Exam/Labs/Scans Due Next Due and Provider Responsible   1 month after completion of Radiation Therapy Radiation Oncologist . Physical Exam Follow schedule with your Surgeon and Medical Oncologist   3 months Surgeon . Physical Exam In 3 months with Medical Oncologist   6 months Medical Oncologist . Physical Exam . CEA In 3 months with Surgeon   9 months Surgeon . Physical Exam In 3 months with Medical Oncologist   1 year Medical Oncologist . Physical Exam . CEA . Other labs . CT of the chest, abdomen,  & pelvis . Colonoscopy In 3 months with Surgeon   1 year 3 months Surgeon . Physical Exam In 3 months with Medical Oncologist   1 year 6  months Medical Oncologist . Physical Exam . CEA In 3 months with Surgeon   1 year 9 months Surgeon . Physical Exam In 3 months with Medical Oncologist   2 years Medical Oncologist . Physical Exam . CEA . Other labs . CT of the chest, abdomen, & pelvis In 6 months with Surgeon   2 years 6 months Surgeon . Physical Exam In 6 months with Medical Oncologist   3 years Medical Oncologist . Physical Exam . CEA . CT of the chest, abdomen, & pelvis In 6 months with Surgeon   3 years 6 months Surgeon . Physical Exam In 6 months with Medical Oncologist   4 years Medical Oncologist . Physical Exam . CEA . Colonoscopy In 6 months with Surgeon   4 years 6 months Surgeon . Physical Exam In 6 months with Medical Oncologist   5 years Medical Oncologist  . Physical Exam . CEA . Other labs May transition to Survivorship!    After 5 years Eligible to transition to Survivorship Clinic . Annual visit with  routine labs and physical exam Lubertha Basque, NP       Screening Recommendations Get regular screenings at frequencies, as indicated  Study/Test and Recommendations Frequency Next Due and Provider Responsible  Annual Physical . Primary Care Provider . Should include skin examination Annually Discuss with Dr. Avon Gully   Prostate Exam/PSA (Men) . Beginning at age 58, talk with your Primary Care Provider about the pros and cons of testing, so you can decide if testing is the right choice for you.  Discuss with Dr. Avon Gully Discuss with Dr. Avon Gully          What Now?:  *Often cancer survivors are fearful about their cancer coming back or being diagnosed with a new cancer.  Below are symptoms that you and your loved ones should watch for and report to your provider.    Symptoms to Watch for and Report to Your Provider .  Return of the cancer symptoms you had before- such as a lump or new growth where your cancer first started . New or unusual pain that seems unrelated to an injury and does not go away, including back pain or bone pain . Weight loss without trying/intending . Unexplained bleeding . A rash or allergic reaction, such as swelling, severe itching or wheezing . Chills or fevers . Persistent headaches . Shortness of breath or difficulty breathing . Bloody Stools or blood in your urine . Lumps, bumps, swelling and/or nipple discharge . Nausea, vomiting, diarrhea, loss of appetite, or trouble swallowing . A cough that doesn't go away . Abdominal pain . Swelling in your arms or legs . Fractures . Hot flashes  . Any other signs mentioned by your doctor or nurse or any unusual symptoms                 that you just can't explain   NOTE: Just because you have certain symptoms, it doesn't mean the cancer has come back or you have a new cancer. Symptoms can be due to other problems that need to be addressed.  It is important to watch for these symptoms and  report them to your provider so you can be medically evaluated for any of these concerns!      What will Dr. Felecia Shelling help me manage?:  Your Primary Care Provider is an integral part of maintaining your health.  All preventive care (like helping arrange your colonoscopy, pap smear, prostate exam,  and annual physical exam) will be critical.  Dr. Legrand Rams should also manage any other chronic condition they have helped you with in the past.  This may include things like blood pressure control, diabetes management, cholesterol control, weight management, etc.             Resources, Interventions, and Education:         *Survivors of cancer often report some of the following needs or concerns after they have completed treatment.  Below are some suggested interventions and resources to help guide you.  Just because your cancer treatment has ended, it does not mean that we stop helping you manage your needs or concerns.  Please let us know how we can best help you in your new life post-cancer and your return to health and wellness!           Common Needs/Concerns Suggested intervention(s)  Fatigue   . Regular physical activity-walking 20 minutes daily   . Evaluation for hypothyroidism, anemia, depression  Chronic diarrhea or incontinence   . Consider anti-diarrheal agents, bulk-forming agents, diet changes, and/or protective undergarments.    . Referral to Pelvic Floor Rehabilitation Therapist  Peripheral neuropathy (numbness/tingling in hands or feet)   . This is a very common side effect of several of the chemotherapy drugs used to treat cancer.  Often times, the neuropathy will improve with time.    . May require prescription medications for management of nerve pain.    Marland Kitchen Acupuncture may offer some relief as well.    Memory problems and/or confusion   . Patients should know that about 25% of cancer patients have some degree of cognitive dysfunction (meaning memory problems, trouble concentrating,  trouble finding the right word, etc) after treatment.  This usually gets better over time.    . May need evaluation for sleep problems contributing to memory problems or depression.  Depression/General Anxiety   . Consider referral for psychiatric care or counseling and/or initiation of pharmacotherapy   . Referral to Education officer, museum    . And/or referral to support groups at the [facility].   . Other resources: [place] www.cancer.org, 1.517-181-8290   . [place] local resources: [phone]  Insomnia or trouble sleeping As appropriate:    . Instructions on sleep hygiene   . Pain evaluation   . Treatment for anxiety  Weight loss BMI: 20.0 m (weight change of -15 lb)   . Dietary counseling   . Referral to dietician or weight management support group (e.g., Weight Watchers, Overeaters Anonymous)  Change in your eating habits, your taste, or your smell . Choose foods with tart flavors like lemon wedges, lemonade, citrus fruits, vinegar, and pickled foods.    . Add sweeteners or a little bit of sugar to foods. A little sweetness can help increase pleasant tastes.    . Season foods with herbs/spices/or other seasonings like onion, garlic, chili powder, barbecue sauce, mustard, ketchup, mint, etc.  . If meats taste strange, marinate or cook means in sweet juices, fruits, acidic dressings, or win.    . If certain foods or drinks smell unpleasant, choose foods that do not need to be cooked, like cold sandwiches, crackers and cheese, yogurt and fruit, or cold cereal.    . Keep your mouth clean and healthy by rinsing and brushing your teeth after meals and before bed.    Lymphedema   . Referral to lymphedema physical therapy specialist for lymphatic massage   . Weight training   .  Maintenance of healthy weight  Male patients: Erectile dysfunction   . Consider a prescription for erectile dysfunction (ex: Sildenafil [Viagra], tadalafil [Cialis], vardenafil [Levitra],  etc.).    Marland Kitchen Referral to Urologist for consideration for penile implant, vacuum devices, injections, and other options.  Urinary incontinence, frequency, and urgency   . Referral to Pelvic Floor Rehabilitation Therapist     . Consider referral to urology or gynecology specialist.  Intimacy and sexuality    . Referral to counseling, support group, and/or couples' professional sex therapy with a licensed provider.   . Evaluation and treatment for anxiety, depression, as appropriate  Marital/partner/family relationships   . Referral to Optician, dispensing   Employment, Scientist, product/process development, and/or finances   . Referral to Social Worker  Concerns regarding spirituality, faith, coping, relating to God, loss of faith, facing my mortality, and/or loss of my sense of purpose . Referral to Chaplain   Returning to an exercise program   . Consider joining the [facility], a 12-week program that meets twice a week for 90 minutes using traditional exercise methods to ease you back into fitness and help you maintain a healthy weight.   . This program is free for both you and one caregiver!  Marland Kitchen Offered at 2 local [employer].   Minette Brine Lauren Ruthann Cancer Engineering geologist) at [phone] or [email or web address]       Living a Life of Wellness After Cancer:  *Note: Please consult your health care provider before using any medications, supplements, over-the-counter products, or other interventions.  Also, please consult your primary care provider before you begin any lifestyle program (diet, exercise, etc).  Your safety is our top priority and we want to make sure you continue to live a long and healthy life!   Healthy Lifestyle Recommendations  As a cancer survivor, it will be important to maintain a lifelong commitment to a healthy lifestyle. . Maintain a healthy weight . Exercise often per your doctor's orders . East balanced diet high in fruits, vegetables, bran and fiber . Limit  how much alcohol you consume, if at all . Osteoporosis screening . Cardiovascular disease screening . Stop smoking (if you smoke) . Know yourself, your family history, and your risks . Be mindful of your emotional, social, and spiritual needs . Establish with a Primary Care Provider if you do not already have one . Attend Finding Your New Normal [facility]) cancer survivorship classes      Thank you so much for allowing [place] to care for you during your cancer experience.  Our continued commitment is to you and your caregiver(s) health and happiness and again, Congratulations!    Mike Craze, NP Survivorship Program [place] [phone]

## 2016-04-10 ENCOUNTER — Encounter (HOSPITAL_COMMUNITY): Payer: Commercial Managed Care - HMO

## 2016-04-10 ENCOUNTER — Encounter: Payer: Self-pay | Admitting: *Deleted

## 2016-04-10 ENCOUNTER — Encounter (HOSPITAL_COMMUNITY): Payer: Commercial Managed Care - HMO | Attending: Hematology & Oncology | Admitting: Adult Health

## 2016-04-10 VITALS — BP 136/88 | HR 89 | Temp 97.9°F | Resp 16

## 2016-04-10 DIAGNOSIS — R5383 Other fatigue: Secondary | ICD-10-CM

## 2016-04-10 DIAGNOSIS — C20 Malignant neoplasm of rectum: Secondary | ICD-10-CM

## 2016-04-10 DIAGNOSIS — G4733 Obstructive sleep apnea (adult) (pediatric): Secondary | ICD-10-CM | POA: Diagnosis not present

## 2016-04-10 LAB — CBC WITH DIFFERENTIAL/PLATELET
BASOS ABS: 0 10*3/uL (ref 0.0–0.1)
Basophils Relative: 0 %
Eosinophils Absolute: 0.3 10*3/uL (ref 0.0–0.7)
Eosinophils Relative: 12 %
HEMATOCRIT: 38.9 % — AB (ref 39.0–52.0)
HEMOGLOBIN: 12.9 g/dL — AB (ref 13.0–17.0)
LYMPHS ABS: 0.5 10*3/uL — AB (ref 0.7–4.0)
LYMPHS PCT: 19 %
MCH: 30.1 pg (ref 26.0–34.0)
MCHC: 33.2 g/dL (ref 30.0–36.0)
MCV: 90.7 fL (ref 78.0–100.0)
Monocytes Absolute: 0.2 10*3/uL (ref 0.1–1.0)
Monocytes Relative: 7 %
NEUTROS ABS: 1.7 10*3/uL (ref 1.7–7.7)
Neutrophils Relative %: 62 %
PLATELETS: 150 10*3/uL (ref 150–400)
RBC: 4.29 MIL/uL (ref 4.22–5.81)
RDW: 12.9 % (ref 11.5–15.5)
WBC: 2.8 10*3/uL — AB (ref 4.0–10.5)

## 2016-04-10 LAB — COMPREHENSIVE METABOLIC PANEL
ALK PHOS: 76 U/L (ref 38–126)
ALT: 32 U/L (ref 17–63)
AST: 34 U/L (ref 15–41)
Albumin: 4.2 g/dL (ref 3.5–5.0)
Anion gap: 6 (ref 5–15)
BILIRUBIN TOTAL: 0.7 mg/dL (ref 0.3–1.2)
BUN: 11 mg/dL (ref 6–20)
CALCIUM: 9.3 mg/dL (ref 8.9–10.3)
CHLORIDE: 105 mmol/L (ref 101–111)
CO2: 27 mmol/L (ref 22–32)
CREATININE: 0.92 mg/dL (ref 0.61–1.24)
GFR calc Af Amer: >60 mL/min (ref >60)
Glucose, Bld: 102 mg/dL — ABNORMAL HIGH (ref 65–99)
Potassium: 3.7 mmol/L (ref 3.5–5.1)
Sodium: 138 mmol/L (ref 135–145)
Total Protein: 8 g/dL (ref 6.5–8.1)

## 2016-04-10 MED ORDER — HEPARIN SOD (PORK) LOCK FLUSH 100 UNIT/ML IV SOLN
500.0000 [IU] | Freq: Once | INTRAVENOUS | Status: AC
  Administered 2016-04-10: 500 [IU] via INTRAVENOUS

## 2016-04-10 MED ORDER — HEPARIN SOD (PORK) LOCK FLUSH 100 UNIT/ML IV SOLN
INTRAVENOUS | Status: AC
  Filled 2016-04-10: qty 5

## 2016-04-10 MED ORDER — SODIUM CHLORIDE 0.9% FLUSH
20.0000 mL | INTRAVENOUS | Status: DC | PRN
  Administered 2016-04-10: 20 mL via INTRAVENOUS
  Filled 2016-04-10: qty 20

## 2016-04-10 NOTE — Progress Notes (Signed)
[place], [place] [address]   CLINIC:  Survivorship  REASON FOR VISIT:  Survivorship Care Plan visit & to address acute survivorship needs with Survivorship NP & Oncology Clinical Social Worker   BRIEF ONCOLOGY HISTORY:    Rectal cancer ([place])   03/31/2015 Initial Biopsy Rectum, biopsy, rectal mass - INVASIVE ADENOCARCINOMA   06/02/2015 Tumor Marker Results for MALAKAI, SCHOENHERR (MRN [id number]) as of 08/28/2015 09:09  06/02/2015 16:00 CEA: 4.3    06/08/2015 Imaging CT abd/pelvis-Rectal primary, without evidence of metastatic disease or acute complication.   06/24/2015 Imaging CT chest- No specific features identified to suggest metastatic disease to the chest.   07/06/2015 Definitive Surgery Robotic-assisted lower anterior resrction, rigid proctoscopy [name])- INVASIVE ADENOCARCINOMA, 2 cm, TUMOR INVADES INTO SUBMUCOSA. LVI/PNI (+).  1/14 LN (+). Margins neg.    07/06/2015 Miscellaneous IHC Tumor Expression Results normal.  Negative for MLH1, MSH2, MSH6, & PMS2.    07/06/2015 Pathologic Stage pT1, pN1a, pMx   08/15/2015 - 11/16/2015 Chemotherapy FOLFOX x 6 cycles (adjuvant chemo "phase 1")   09/26/2015 Treatment Plan Change Treatment held today.  5FU bolus D/C'd.  Neulasta OnPro added to each subsequent cycles of therapy.   10/04/2015 Treatment Plan Change Neulasta onpro added to all subsequent cycles of treatment.   10/18/2015 Treatment Plan Change Tx held for thrombocytopenia.  5FU CI is reduced by 20% for subsequent treatments.   12/05/2015 - 01/16/2016 Chemotherapy 5FU continuous infusion weekly x 6 cycles; concurrent radiation. (adjuvant chemo "phase 2")   12/05/2015 - 01/18/2016 Radiation Therapy Pelvic radiation completed in [facility]).  Pelvis 45 Gy in 25 fractions.  Then cone down boost treatment for additional 5.4 Gy in 8 fractions.  Total dose: 50.4 Gy in 33 treatments   01/30/2016 - 02/13/2016 Chemotherapy FOLFOX x 2 cycles to complete 6 months of adjuvant  chemotherapy.  (adjuvant chemo "phase 3")     INTERVAL HISTORY:  Mr. Roehrig reports feeling overall pretty well since his last visit to the cancer about 1.5 months ago.  He states that his appetite is good. He has some fatigue, which he attributes to "coming down" from being so busy with his cancer treatments.  He also keeps very busy with his 3 sons, ages 46, 57, & 23. He also just found out that he will be a grandfather to a grandson soon as well.  He is walking with a cane since his back surgery many years ago.    He denies any recent weight loss, blood in his stools/dark tarry stools, N&V, abdominal pain.  He is largely without physical complaints today.  He does endorse some fear of cancer recurrence, as well as some financial issues.     ADDITIONAL REVIEW OF SYSTEMS:  Review of Systems  Constitutional: Positive for malaise/fatigue.       Ambulates with cane d/t back surgery  Respiratory: Negative for shortness of breath.   Cardiovascular: Negative for chest pain.  Gastrointestinal: Negative for nausea, vomiting, abdominal pain, diarrhea, constipation, blood in stool and melena.  Genitourinary: Negative for dysuria and hematuria.  Musculoskeletal: Positive for back pain.       Chronic back pain  Neurological: Negative for headaches.  Psychiatric/Behavioral: Negative for depression. The patient is nervous/anxious.        Endorses fear of cancer recurrence     PAST MEDICAL & SURGICAL HISTORY:  Past Medical History  Diagnosis Date  . Hypertension   . High cholesterol   . Sleep apnea   .  Rectal cancer ([place])   . Depression   . Arthritis   . Diabetes mellitus     "Borderline"  . GERD (gastroesophageal reflux disease)     No weakness  . Neuropathy ([place])     "back missed up"  . Cancer [facility])     rectal   Past Surgical History  Procedure Laterality Date  . Back surgery    . Cholecystectomy    . Colonoscopy with propofol N/A 03/31/2015    Procedure: COLONOSCOPY WITH PROPOFOL  at cecum 0842; withdrawal time=13mnutes;  Surgeon: RDaneil Dolin MD;  Location: [facility];  Service: Endoscopy;  Laterality: N/A;  . Polypectomy  03/31/2015    Procedure: POLYPECTOMY;  Surgeon: RDaneil Dolin MD;  Location: [facility];  Service: Endoscopy;;  . Biopsy  03/31/2015    Procedure: BIOPSY;  Surgeon: RDaneil Dolin MD;  Location: [facility];  Service: Endoscopy;;  . Flexible sigmoidoscopy N/A 07/05/2015    Procedure: FLEXIBLE SIGMOIDOSCOPY;  Surgeon: ALeighton Ruff MD;  Location: [facility];  Service: Endoscopy;  Laterality: N/A;  with tattoo  . Xi robotic assisted lower anterior resection N/A 07/06/2015    Procedure: XI ROBOTIC ASSISTED LOWER ANTERIOR RESECTION, rigid proctoscopy;  Surgeon: ALeighton Ruff MD;  Location: [facility];  Service: General;  Laterality: N/A;  . Portacath placement N/A 08/09/2015    Procedure: INSERTION PORT-A-CATH LEFT SUBCLAVIAN;  Surgeon: ALeighton Ruff MD;  Location: [facility]  Service: General;  Laterality: N/A;     SOCIAL HISTORY: Mr. CFleekis legally separated from his wife.  His 150year old son lives at home with him.  He has 3 sons and is expecting a grandson.  He is on disability. He does not smoke and he does not drink alcohol.   CURRENT MEDICATIONS:  Current Outpatient Prescriptions on File Prior to Visit  Medication Sig Dispense Refill  . ACCU-CHEK SMARTVIEW test strip     . amLODipine (NORVASC) 5 MG tablet Take 1 tablet (5 mg total) by mouth every morning. 30 tablet 2  . dextrose 5 % SOLN 1,000 mL with fluorouracil 5 GM/100ML SOLN Inject into the vein. To be given every 14 days x 12 cycles. To run for 46 hours. Has not started yet.    . docusate sodium (COLACE) 100 MG capsule Take 100 mg by mouth daily.    .Marland Kitchengabapentin (NEURONTIN) 800 MG tablet Take 1 tablet (800 mg total) by mouth 3 (three) times daily. 90 tablet 2  . HYDROcodone-acetaminophen (NORCO) 7.5-325 MG tablet Take 1 tablet by mouth every 12 (twelve) hours as needed for moderate pain. 45 tablet 0    . LEUCOVORIN CALCIUM IJ Inject as directed. To be given every 14 days x 12 cycles. Has not started yet.    . lidocaine-prilocaine (EMLA) cream Apply a quarter size amount to port site 1 hour prior to chemo. Do not rub in. Cover with plastic wrap. 30 g 3  . lisinopril (PRINIVIL,ZESTRIL) 40 MG tablet Take 1 tablet (40 mg total) by mouth every morning. 30 tablet 2  . loperamide (IMODIUM A-D) 2 MG tablet Take 2 mg by mouth as needed for diarrhea or loose stools.    . NON FORMULARY PT HAS A C-PAP MACHINE    . nortriptyline (PAMELOR) 75 MG capsule Take 1 capsule (75 mg total) by mouth at bedtime. 30 capsule 2  . OXALIPLATIN IV Inject into the vein. Reported on 12/26/2015    . polyethylene glycol powder (GLYCOLAX/MIRALAX) powder Take 1 Container by mouth once.    .Marland Kitchen  tiZANidine (ZANAFLEX) 2 MG tablet Take 1 tablet (2 mg total) by mouth every 8 (eight) hours as needed for muscle spasms. 30 tablet 2  . traMADol (ULTRAM) 50 MG tablet May take 1-2 tabs every 6 hours as needed for pain 120 tablet 0  . [DISCONTINUED] prochlorperazine (COMPAZINE) 10 MG tablet Take 1 tablet (10 mg total) by mouth every 6 (six) hours as needed (Nausea or vomiting). (Patient not taking: Reported on 10/06/2015) 30 tablet 1   No current facility-administered medications on file prior to visit.    ALLERGIES: No Known Allergies  PHYSICAL EXAM:  Filed Vitals:   04/10/16 1100  BP: 136/88  Pulse: 89  Temp: 97.9 F (36.6 C)  Resp: 16   *Oncology clinical social worker present during office visit.   General: Well-appearing male in no acute distress.  Unaccompanied today.  HEENT: Head is normocephalic. Conjunctivae clear without exudate.  Sclerae anicteric. Oral mucosa is pink and moist without lesions. Oropharynx is pink and moist without lesions. Lymph: No cervical, supraclavicular, or infraclavicular lymphadenopathy noted on palpation.   Cardiovascular: Normal rate and rhythm Respiratory: Expiratory wheezes to RUL and  bilateral bases. (states he has seasonal allergies that make him wheeze sometimes; denies any shortness of breath). LUL clear. Chest expansion symmetric without accessory muscle use. Breathing non-labored.    GU: Deferred.   GI: Soft, non-tender abdomen. Normoactive bowel sounds.  Neuro: No focal deficits. Steady gait with cane.  Psych: Normal mood and affect for situation. Extremities: No edema.   Skin: Warm and dry.   LABORATORY DATA: None for this visit.   DIAGNOSTIC IMAGING:  None for this visit.    ASSESSMENT & PLAN:  Mr. Wuthrich is a pleasant 55 y.o. male with history of Stage III rectalr, treated with lower anterior resection (Dr. [name]), then adjuvant chemo with FOLFOX x 6 cycles, concurrent chemoradiation with 5-FU, then completed 2 additional cycles of FOLOX to complete 6 months of adjuvant chemo; completed treatment on 02/13/16.  Patient presents to survivorship clinic today for survivorship care plan visit and to address any acute survivorship concerns since completing treatment.    1. Stage III rectal cancer: Mr. Nobrega is continuing to recover from the effects of his cancer treatments. Today, he received a copy of his survivorship care plan (SCP) document, which was reviewed with him.  The SCP details his cancer treatment history and potential late/long-term side effects of those treatments.  We discussed the follow-up schedule he can anticipate with interval imaging for surveillance of his cancer.  I have also shared a copy of his treatment summary/SCP with his PCP.  Mr. Debes will return to the survivorship clinic as needed; he will return to [facility] for surveillance visit with Dr. Cindie Laroche in 04/2016.   2. Fatigue:  His fatigue is likely multifactorial. He is recovering from the effects of surgery, adjuvant chemotherapy, concurrent chemoradiation, and additional chemotherapy. He completed treatment about 2 months ago.  Socially, he has several  stressors. He has 3 sons (ages 51, 39, & 54) and is also expecting a grandson.  He is also working hard to effectively cope with his illness and recovery as a cancer survivor.  He endorses that he is not very physically active like he was prior to treatment.  He would like to restart a walking program.  We discussed the important of physical activity in not only helping combat his fatigue, but in also helping keep him overall healthy.    3. Physical  activity/Healthy eating: Getting adequate physical activity and maintaining a healthy diet as a cancer survivor is important for overall wellness and reduces the risk of cancer recurrence. We discussed having him slowly restart his walking program, working his way up to 30 minutes per day most days of the week.  We also reviewed "The Nutrition Rainbow" handout for healthy eating guidelines, as well as the American Cancer Society's booklet with recommendations for nutrition and physical activity.    4. Health promotion/Cancer screening:  Mr. Coor is unsure if he is up-to-date on his PSA tests and vaccinations.  I encouraged him to talk with his PCP about arranging appropriate cancer screening tests, as appropriate.   5. Support services/Counseling: Mr. Renovato was seen today in conjunction with Loren Racer, LCSW, in an effort to address both the physical and social concerns of our cancer survivors at [facility].  (Please see LCSW note for additional documentation & recommendations).  It is not uncommon for this period of the patient's cancer care trajectory to be one of many emotions and stressors.  He really enjoys the programs at [facility], particularly the support groups and yoga he finds most helpful.  I provided support today through active listening, acknowledgment/validation of concerns, and expressive supportive counseling.  I commended his efforts for self-care and for his participation in [employer] he enjoys.  Mr.  Knierim was encouraged to take advantage of our support services programs and support groups to better cope in his new life as a cancer survivor after completing anti-cancer treatment.   6. Financial concerns: Please see Loren Racer, LCSW's documentation for additional details.  Dispo:  -Return to survivorship clinic as needed; no additional follow-up needed at this time.  -Consider transitioning the patient to long-term survivorship, when clinically appropriate.   A total of 30 minutes was spent in face-to-face care of this patient, with greater than 50% of that time spent in counseling and care coordination.   Mike Craze, NP Survivorship Program [place] [phone]

## 2016-04-10 NOTE — Progress Notes (Signed)
[name] presented for Portacath access and flush. Proper placement of portacath confirmed by CXR. Portacath located left chest wall accessed with  H 20 needle. Good blood return present.  Specimen drawn for labs. Portacath flushed with 67ml NS and 500U/49ml Heparin and needle removed intact. Procedure without incident. Patient tolerated procedure well.

## 2016-04-10 NOTE — Progress Notes (Signed)
[place] CLINICAL SOCIAL WORK PSYCHOSOCIAL ASSESSMENT   Date:  04/10/2016   First Name: Douglas Ripp.I.: A     Last Name: Kalas MRN:  [id number]  The patient was referred to Survivorship Clinic to assess for psychosocial, emotional, spiritual, and practical needs.    Primary Cancer Type: Rectal cancer ([place])   Staging form: Colon and Rectum, AJCC 7th Edition     Pathologic stage from 09/12/2015: Stage IIIA (T1, N1a, cM0) - Signed by [name], PA-C on 09/25/2015     Clinical stage from 09/26/2015: Stage IIIA (T1, N1a, M0) - Signed by [name], PA-C on 09/25/2015         Marital Status: Legally Separated  Practical Problems: yes Financial     Employment:  on disability   Source of Income: Social Security Disability, food stamps    Insurance: Psychologist, prison and probation services   Family Problems: None Identified Concerns caring for family needs: no    Emotional Problems: None Identified  Concerns of Adjustment to Diagnosis/Treatment: Some anxiety now in that he is in Survivorship, related to recurrence concerns.   Current Symptoms of Anxiety: no   Current Symptoms of Depression: No symptoms identified  Safety/Risk Concerns: None identified   Mental Status Exam Orientation: person, place, and time Affect: appropriate Thought: normal      Spiritual/Religious: None identified  Living Situation: with 40 yo son  Functional Status: Independent                                                                                            Strengths and Barriers:   Patient Coping Strengths:   Supportive Relationships, Family, Friends, Financial controller, Hopefulness, Conservator, museum/gallery, Able to [employer] and Attends support programs through [employer] regularly.                                                                                                 Identified Problems/Needs and Barriers to Care: Financial, ongoing financial concerns Pt has  yet to apply for medicaid, but is aware he qualifies and how to apply.     Counseling and Social Work Interventions and Recommendations:  Scientific laboratory technician   Impressions/Plan: CSW met with pt at [employer] alongside Mike Craze, NP. Pt is well known to CSW from during his cancer treatment. Pt appears in great spirits today with a positive outlook and bright affect. He shared he has found [facility] a huge support and he attends several programs there each week. Pt reports he has found benefit from helping others and the center is close to his home. He shared some anxiety related to recurrence and CSW and NP validated these emotions and discussed coping  techniques. Pt continues to have financial toxicity. He has been on ss disability for many years. He has difficulty affording co-pays and gas at times to get to medical appointments. These were concerns during active treatment and pt did receive some gas assistance through [employer]. CSW has educated pt in the past of other assistance options that are available. CSW encouraged pt to apply for Medicaid to assist with co-pay and medical expense burden. He seems to continue to have strong family support and is very much enjoying spending time with his grown sons. He is looking forward to his first grandchild and plans to continue to attending [name]. CSW reviewed other classes and supports through [facility]. Pt agrees to reach out as needs arise, but pt is doing very well overall.   Loren Racer, [place] Tuesdays   [phone]

## 2016-04-10 NOTE — Patient Instructions (Signed)
[facility] Discharge Instructions  RECOMMENDATIONS MADE BY THE CONSULTANT AND ANY TEST RESULTS WILL BE SENT TO YOUR REFERRING PHYSICIAN.  Port flush with labs today.    Thank you for choosing  Cancer Center at [facility] to provide your oncology and hematology care.  To afford each patient quality time with our provider, please arrive at least 15 minutes before your scheduled appointment time.   Beginning January 23rd 2017 lab work for the Cancer Center will be done in the  Main lab at [facility] on 1st floor. If you have a lab appointment with the Cancer Center please come in thru the  Main Entrance and check in at the main information desk  You need to re-schedule your appointment should you arrive 10 or more minutes late.  We strive to give you quality time with our providers, and arriving late affects you and other patients whose appointments are after yours.  Also, if you no show three or more times for appointments you may be dismissed from the clinic at the providers discretion.     Again, thank you for choosing [facility].  Our hope is that these requests will decrease the amount of time that you wait before being seen by our physicians.       _____________________________________________________________  Should you have questions after your visit to  Cancer Center, please contact our office at [phone] between the hours of 8:30 a.m. and 4:30 p.m.  Voicemails left after 4:30 p.m. will not be returned until the following business day.  For prescription refill requests, have your pharmacy contact our office.         Resources For Cancer Patients and their Caregivers ? American Cancer Society: Can assist with transportation, wigs, general needs, runs Look Good Feel Better.        1-888-227-6333 ? Cancer Care: Provides financial assistance, online support groups, medication/co-pay assistance.   1-800-813-HOPE (4673) ? [facility] Assists [place] cancer patients and their families through emotional , educational and financial support.  336-427-4357 ? [place] DSS Where to apply for food stamps, Medicaid and utility assistance. 336-342-1394 ? [facility]: Transportation to medical appointments. 336-347-2287 ? Social Security Administration: May apply for disability if have a Stage IV cancer. 336-342-7796 1-800-772-1213 ? [place] Aging, Disability and Transit Services: Assists with nutrition, care and transit needs. 336-349-2343  Cancer Center Support Programs: @10RELATIVEDAYS@ > Cancer Support Group  2nd Tuesday of the month 1pm-2pm, Journey Room  > Creative Journey  3rd Tuesday of the month 1130am-1pm, Journey Room  > Look Good Feel Better  1st Wednesday of the month 10am-12 noon, Journey Room (Call American Cancer Society to register 1-800-395-5775)    

## 2016-04-11 ENCOUNTER — Encounter (HOSPITAL_COMMUNITY): Payer: Self-pay | Admitting: Adult Health

## 2016-04-11 LAB — CEA: CEA: 4 ng/mL (ref 0.0–4.7)

## 2016-04-12 ENCOUNTER — Encounter (HOSPITAL_COMMUNITY): Payer: Commercial Managed Care - HMO

## 2016-04-13 ENCOUNTER — Encounter: Payer: Self-pay | Admitting: *Deleted

## 2016-04-13 ENCOUNTER — Encounter: Payer: Self-pay | Admitting: Licensed Clinical Social Worker

## 2016-04-13 ENCOUNTER — Other Ambulatory Visit: Payer: Self-pay | Admitting: Licensed Clinical Social Worker

## 2016-04-13 NOTE — Patient Outreach (Signed)
Assessment:  CSW spoke via phone with client on 04/13/16. CSW verified client identity.  CSW and client spoke of client needs. Client said he drives his Lucianne Lei to and from scheduled medical appointments. He said he had his prescribed medications and is taking medications as prescribed. His son helps client with food procurement for client. Client said he is eating well and sleeping well. Client sees Dr. Legrand Rams as primary care physician.  CSW and client spoke of client care plan.  CSW encouraged client to collect documents needed for Medicaid application and to apply for Medicaid in next 30 days. CSW also informed client that client could call Medicaid caseworker at local Department of Social Services and talk with Medicaid caseworker about client's questions regarding Medicaid application for client. Client said he has Food Stamps benefit and this benefit is helpful to him. He said he goes one time monthly to local [employer] for food assistance. CSW and client spoke of [facility]. Client said he goes about 3 times weekly to [facility]. He said that he participates in groups at Center that talk about cancer and cancer treatments. He said also recreational activities are conducted at [employer]. CSW asked him about Survivorship Clinic involvement at [facility]. He said he had talked with CSW at [facility] about [facility] involvement of client. He had told that particular CSW that he would be glad to talk with others at [facility] about his experiences in facing cancer and undergoing treatment for cancer.  CSW encouraged Kiyaan to call CSW at [phone] as needed to discuss social work needs of client.  CSW thanked Jeronimo for phone conversation with CSW on 04/13/16. [place] was appreciative of phone call from [place] on 04/13/16.   Plan:  Client to gather documents needed for Medicaid application and client to apply for Medicaid in next 30  days. CSW to call client in 3 weeks to assess client needs at that time.[name].Zayin Valadez MSW, LCSW Licensed Clinical Social Worker [facility] Care Management [phone]

## 2016-04-13 NOTE — Progress Notes (Signed)
[facility] Clinical Social Work  Clinical Social Work was referred by nurse, Lupita Raider for assessment of psychosocial needs due to questions about "Survivorship Classes" . Clinical Social Worker attempted to contact patient at home several times to offer support and assess for needs.  Pt's phone would answer and then hang up. CSW and NP provided pt with packet of multiple resources, classes and programs that were available to assist pt as he transitions into survivorship. CSW will continue to try to reach pt via phone, however, he should have access to all needed information.    Loren Racer, [place] Tuesdays   [phone]

## 2016-04-24 ENCOUNTER — Encounter (HOSPITAL_COMMUNITY): Payer: Commercial Managed Care - HMO | Attending: Hematology & Oncology | Admitting: Oncology

## 2016-04-24 ENCOUNTER — Ambulatory Visit (HOSPITAL_COMMUNITY): Payer: Commercial Managed Care - HMO | Admitting: Oncology

## 2016-04-24 VITALS — BP 124/89 | HR 92 | Temp 97.8°F | Resp 16 | Wt 199.0 lb

## 2016-04-24 DIAGNOSIS — C20 Malignant neoplasm of rectum: Secondary | ICD-10-CM | POA: Diagnosis not present

## 2016-04-24 MED ORDER — TRAMADOL HCL 50 MG PO TABS: ORAL_TABLET | ORAL | Status: DC

## 2016-04-24 MED ORDER — HYDROCODONE-ACETAMINOPHEN 7.5-325 MG PO TABS: 1.0000 | ORAL_TABLET | Freq: Two times a day (BID) | ORAL | Status: DC | PRN

## 2016-04-24 NOTE — Patient Instructions (Addendum)
[place] at [facility] Discharge Instructions  RECOMMENDATIONS MADE BY THE CONSULTANT AND ANY TEST RESULTS WILL BE SENT TO YOUR REFERRING PHYSICIAN.  Exam done and seen today by [facility] in 8 weeks CT of abdomen and pelvis around 8 weeks Return to see the Doctor in 8 weeks Call the clinic for any concerns or questions.  Journey Room is on the 2nd floor- old chapel.   Thank you for choosing [place] at [facility] to provide your oncology and hematology care.  To afford each patient quality time with our provider, please arrive at least 15 minutes before your scheduled appointment time.   Beginning January 23rd 2017 lab work for the [facility] will be done in the  Main lab at [facility] on 1st floor. If you have a lab appointment with the [facility] please come in thru the  Main Entrance and check in at the main information desk  You need to re-schedule your appointment should you arrive 10 or more minutes late.  We strive to give you quality time with our providers, and arriving late affects you and other patients whose appointments are after yours.  Also, if you no show three or more times for appointments you may be dismissed from the clinic at the providers discretion.     Again, thank you for choosing [facility].  Our hope is that these requests will decrease the amount of time that you wait before being seen by our physicians.       _____________________________________________________________  Should you have questions after your visit to [facility], please contact our office at [phone] between the hours of 8:30 a.m. and 4:30 p.m.  Voicemails left after 4:30 p.m. will not be returned until the following business day.  For prescription refill requests, have your pharmacy contact our office.         Resources For Cancer Patients and their Caregivers ? American Cancer Society: Can  assist with transportation, wigs, general needs, runs Look Good Feel Better.        (412) 333-9785 ? Cancer Care: Provides financial assistance, online support groups, medication/co-pay assistance.  1-800-813-HOPE (219) 180-3174) ? [facility] Assists [place] cancer patients and their families through emotional , educational and financial support.  408-123-5567 ? [place] DSS Where to apply for food stamps, Medicaid and utility assistance. 208-268-2074 ? [facility]: Transportation to medical appointments. (216) 753-2642 ? Social Security Administration: May apply for disability if have a Stage IV cancer. 438 775 6371 (510) 647-6566 ? [employer], Disability and Transit Services: Assists with nutrition, care and transit needs. [place] Support Programs: @10RELATIVEDAYS @ > Cancer Support Group  2nd Tuesday of the month 1pm-2pm, Journey Room  > Creative Journey  3rd Tuesday of the month 1130am-1pm, Journey Room  > Look Good Feel Better  1st Wednesday of the month 10am-12 noon, Journey Room (Call [place] to register [phone])

## 2016-04-24 NOTE — Assessment & Plan Note (Addendum)
Stage IIIA (T1N1AM0) adenocarcinoma of rectum, S/P definitive surgery on 07/06/2015 with segmental resection of rectosigmoid cancer followed by FOLFOX x 6 cycles (08/15/2015- 11/16/2015) with transition to 5FU CI with XRT (12/05/2015- 01/16/2016) and then back to FOLFOX (01/30/2016- 02/13/2016) to complete 6 months worth of adjuvant systemic chemotherapy.  Pre-op CEA was NOT elevated.  Oncology history is up-to-date.  I personally reviewed and went over laboratory results with the patient.  The results are noted within this dictation.  No role for labs today.  He has met with CSW and survivorship clinic with Mike Craze, NP.  Both of these interventions have been not only well received by the patient, but also very beneficial from multiple facets.  He knows to reach out to both services on an as needed basis.  He is due for annual imaging per NCCN guidelines in ~ 8 weeks.  Order is placed for CT abd/pelvis with contrast.  I have messaged Dr. Gala Romney to help coordinate future colonoscopy per NCCN guidelines.  It is noted that Dr. Gala Romney performed the patient's first colonoscopy, but was subsequently referred to [facility] for repeat colonoscopy due to patient's anatomy.  I have messaged Dr. Gala Romney via [facility] for his recommendations regarding best place for repeat colonoscopy prior to March 2018.  Labs in 8 weeks: CBC diff, CMET, CEA.  Pre-operative CEA was not elevated and therefore utility of monitoring CEA moving forward is questionable.  He expresses interest in volunteering to assist and be a support system for future cancer patients.  I will let CSW know this. He is encouraged to attend support groups as his experience and knowledge may be beneficial for others.  I have refilled his month supply of pain medications (tramadol and hydrocodone).  Return in 8 weeks for follow-up.  At that time, we will start planning on appropriate referral for repeat colonoscopy.  Per NCCN guidelines, we are to see the patient  every 3-6 months, HOWEVER, I think it is reasonable to see the patient a little more often during this time given his blood counts, his difficulties through therapy, and also for emotional and spiritual support.  In the not too distant future, based upon how the patient is doing, will space out appointments accordingly.

## 2016-04-24 NOTE — Progress Notes (Signed)
[place], MD [address] [place] [address]  Rectal cancer [facility]) - Plan: CT Abdomen Pelvis W Contrast, CBC with Differential, Comprehensive metabolic panel, CEA, traMADol (ULTRAM) 50 MG tablet, HYDROcodone-acetaminophen (NORCO) 7.5-325 MG tablet  CURRENT THERAPY: Surveillance per NCCN guidelines  INTERVAL HISTORY: [name] 55 y.o. male returns for followup of Stage IIIA (T1N1AM0) adenocarcinoma of rectum, S/P definitive surgery on 07/06/2015 with segmental resection of rectosigmoid cancer followed by FOLFOX x 6 cycles (08/15/2015- 11/16/2015) with transition to 5FU CI with XRT (12/05/2015- 01/16/2016) and then back to FOLFOX (01/30/2016- 02/13/2016) to complete 6 months worth of adjuvant systemic chemotherapy.  Pre-op CEA was NOT elevated.    Rectal cancer ([place])   03/31/2015 Initial Biopsy Rectum, biopsy, rectal mass - INVASIVE ADENOCARCINOMA   06/02/2015 Tumor Marker Results for Douglas, [name] (MRN [id number]) as of 08/28/2015 09:09  06/02/2015 16:00 CEA: 4.3    06/08/2015 Imaging CT abd/pelvis-Rectal primary, without evidence of metastatic disease or acute complication.   06/24/2015 Imaging CT chest- No specific features identified to suggest metastatic disease to the chest.   07/06/2015 Definitive Surgery Robotic-assisted lower anterior resrction, rigid proctoscopy [name])- INVASIVE ADENOCARCINOMA, 2 cm, TUMOR INVADES INTO SUBMUCOSA. LVI/PNI (+).  1/14 LN (+). Margins neg.    07/06/2015 Miscellaneous IHC Tumor Expression Results normal.  Negative for MLH1, MSH2, MSH6, & PMS2.    07/06/2015 Pathologic Stage pT1, pN1a, pMx   08/15/2015 - 11/16/2015 Chemotherapy FOLFOX x 6 cycles (adjuvant chemo "phase 1")   09/26/2015 Treatment Plan Change Treatment held today.  5FU bolus D/C'd.  Neulasta OnPro added to each subsequent cycles of therapy.   10/04/2015 Treatment Plan Change Neulasta onpro added to all subsequent cycles of treatment.   10/18/2015 Treatment Plan Change Tx held  for thrombocytopenia.  5FU CI is reduced by 20% for subsequent treatments.   12/05/2015 - 01/16/2016 Chemotherapy 5FU continuous infusion weekly x 6 cycles; concurrent radiation. (adjuvant chemo "phase 2")   12/05/2015 - 01/18/2016 Radiation Therapy Pelvic radiation completed in [facility]).  Pelvis 45 Gy in 25 fractions.  Then cone down boost treatment for additional 5.4 Gy in 8 fractions.  Total dose: 50.4 Gy in 33 treatments   01/30/2016 - 02/13/2016 Chemotherapy FOLFOX x 2 cycles to complete 6 months of adjuvant chemotherapy.  (adjuvant chemo "phase 3")   I personally reviewed and went over laboratory results with the patient.  The results are noted within this dictation.  Labs were updated on 04/10/2016.  These are reviewed with the patient.  He has met with CSW and survivorship clinic with [place] Craze, NP.  Both of these interventions have been not only well received by the patient, but also very beneficial from multiple facets.  He knows to reach out to both services on an as needed basis.  He is doing very well. He denies any complaints today. He denies any blood in his stools or dark stools. No change in bowel habits. His appetite is strong. His weight is very stable. He seems smiling today and joking around with me.  I reviewed the NCCN guidelines pertaining to surveillance for rectal cancer. He admits that these have been previously reviewed by Dr. Whitney [name] with him. He is educated on the plan moving forward. This includes an upcoming CT scan in approximately 2 months time. He knows that he will have to undergo repeat colonoscopy by GI. He is advised that I've reached out to Dr. Gala [name] for future recommendation regarding colonoscopy. He'll be  due for this prior to his one year anniversary from completion of chemotherapy.    Review of Systems  Constitutional: Negative.   HENT: Negative.   Eyes: Negative.   Respiratory: Negative.   Cardiovascular: Negative.   Gastrointestinal: Negative.   Negative for nausea, vomiting, abdominal pain, diarrhea, constipation, blood in stool and melena.  Genitourinary: Negative.   Musculoskeletal: Negative.   Skin: Negative.   Neurological: Negative.   Endo/Heme/Allergies: Negative.   Psychiatric/Behavioral: Negative.     Past Medical History  Diagnosis Date  . Hypertension   . High cholesterol   . Sleep apnea   . Rectal cancer ([place])   . Depression   . Arthritis   . Diabetes mellitus     "Borderline"  . GERD (gastroesophageal reflux disease)     No weakness  . Neuropathy ([place])     "back missed up"  . Cancer [facility])     rectal    Past Surgical History  Procedure Laterality Date  . Back surgery    . Cholecystectomy    . Colonoscopy with propofol N/A 03/31/2015    Procedure: COLONOSCOPY WITH PROPOFOL at cecum 0842; withdrawal time=65mnutes;  Surgeon: RDaneil Dolin MD;  Location: [facility]S;  Service: Endoscopy;  Laterality: N/A;  . Polypectomy  03/31/2015    Procedure: POLYPECTOMY;  Surgeon: RDaneil Dolin MD;  Location: [facility]S;  Service: Endoscopy;;  . Biopsy  03/31/2015    Procedure: BIOPSY;  Surgeon: RDaneil Dolin MD;  Location: [facility] ORS;  Service: Endoscopy;;  . Flexible sigmoidoscopy N/A 07/05/2015    Procedure: FLEXIBLE SIGMOIDOSCOPY;  Surgeon: ALeighton Ruff MD;  Location: [facility] END[facility];  Service: Endoscopy;  Laterality: N/A;  with tattoo  . Xi robotic assisted lower anterior resection N/A 07/06/2015    Procedure: XI ROBOTIC ASSISTED LOWER ANTERIOR RESECTION, rigid proctoscopy;  Surgeon: ALeighton Ruff MD;  Location: [facility];  Service: General;  Laterality: N/A;  . Portacath placement N/A 08/09/2015    Procedure: INSERTION PORT-A-CATH LEFT SUBCLAVIAN;  Surgeon: ALeighton Ruff MD;  Location: [facility] OR;  Service: General;  Laterality: N/A;    Family History  Problem Relation Age of Onset  . Colon cancer Neg Hx     "Not that I know of"  . Colon polyps Neg Hx     "not that I know of"  . Hypertension      "Not sure who, I don't know  much about my family"  . Diabetes      "Not sure who, I don't know much about my family"    Social History   Social History  . Marital Status: Legally Separated    Spouse Name: N/A  . Number of Children: N/A  . Years of Education: N/A   Social History Main Topics  . Smoking status: Former Smoker    Quit date: 03/23/2006  . Smokeless tobacco: Never Used  . Alcohol Use: No  . Drug Use: No  . Sexual Activity: Not Currently   Other Topics Concern  . Not on file   Social History Narrative     PHYSICAL EXAMINATION  ECOG PERFORMANCE STATUS: 1 - Symptomatic but completely ambulatory  Filed Vitals:   04/24/16 1406  BP: 124/89  Pulse: 92  Temp: 97.8 F (36.6 C)  Resp: 16    GENERAL:alert, no distress, well nourished, well developed, comfortable, cooperative, obese, smiling and unaccompanied today. SKIN: skin color, texture, turgor are normal, no rashes or significant lesions HEAD: Normocephalic, No masses, lesions, tenderness or abnormalities EYES: normal,  EOMI, Conjunctiva are pink and non-injected EARS: External ears normal OROPHARYNX:lips, buccal mucosa, and tongue normal and mucous membranes are moist  NECK: supple, no adenopathy, trachea midline LYMPH:  not examined BREAST:not examined LUNGS: clear to auscultation  HEART: regular rate & rhythm ABDOMEN:abdomen soft, non-tender, obese and normal bowel sounds BACK: Back symmetric, no curvature. EXTREMITIES:less then 2 second capillary refill, no joint deformities, effusion, or inflammation, no skin discoloration, no cyanosis  NEURO: alert & oriented x 3 with fluent speech, no focal motor/sensory deficits, gait normal   LABORATORY DATA: CBC    Component Value Date/Time   WBC 2.8* 04/10/2016 1127   RBC 4.29 04/10/2016 1127   HGB 12.9* 04/10/2016 1127   HCT 38.9* 04/10/2016 1127   PLT 150 04/10/2016 1127   MCV 90.7 04/10/2016 1127   MCH 30.1 04/10/2016 1127   MCHC 33.2 04/10/2016 1127   RDW 12.9 04/10/2016  1127   LYMPHSABS 0.5* 04/10/2016 1127   MONOABS 0.2 04/10/2016 1127   EOSABS 0.3 04/10/2016 1127   BASOSABS 0.0 04/10/2016 1127      Chemistry      Component Value Date/Time   NA 138 04/10/2016 1127   K 3.7 04/10/2016 1127   CL 105 04/10/2016 1127   CO2 27 04/10/2016 1127   BUN 11 04/10/2016 1127   CREATININE 0.92 04/10/2016 1127      Component Value Date/Time   CALCIUM 9.3 04/10/2016 1127   ALKPHOS 76 04/10/2016 1127   AST 34 04/10/2016 1127   ALT 32 04/10/2016 1127   BILITOT 0.7 04/10/2016 1127     Lab Results  Component Value Date   CEA 4.0 04/10/2016      PENDING LABS:   RADIOGRAPHIC STUDIES:  No results found.   PATHOLOGY:    ASSESSMENT AND PLAN:  Rectal cancer ([place]) Stage IIIA (T1N1AM0) adenocarcinoma of rectum, S/P definitive surgery on 07/06/2015 with segmental resection of rectosigmoid cancer followed by FOLFOX x 6 cycles (08/15/2015- 11/16/2015) with transition to 5FU CI with XRT (12/05/2015- 01/16/2016) and then back to FOLFOX (01/30/2016- 02/13/2016) to complete 6 months worth of adjuvant systemic chemotherapy.  Pre-op CEA was NOT elevated.  Oncology history is up-to-date.  I personally reviewed and went over laboratory results with the patient.  The results are noted within this dictation.  No role for labs today.  He has met with CSW and survivorship clinic with [place] Craze, NP.  Both of these interventions have been not only well received by the patient, but also very beneficial from multiple facets.  He knows to reach out to both services on an as needed basis.  He is due for annual imaging per NCCN guidelines in ~ 8 weeks.  Order is placed for CT abd/pelvis with contrast.  I have messaged Dr. Gala [name] to help coordinate future colonoscopy per NCCN guidelines.  It is noted that Dr. Gala [name] performed the patient's first colonoscopy, but was subsequently referred to [facility] for repeat colonoscopy due to patient's anatomy.  I have messaged Dr. Gala [name] via [facility]  for his recommendations regarding best place for repeat colonoscopy prior to March 2018.  Labs in 8 weeks: CBC diff, CMET, CEA.  Pre-operative CEA was not elevated and therefore utility of monitoring CEA moving forward is questionable.  He expresses interest in volunteering to assist and be a support system for future cancer patients.  I will let CSW know this. He is encouraged to attend support groups as his experience and knowledge may be beneficial for others.  Return in 8  weeks for follow-up.  At that time, we will start planning on appropriate referral for repeat colonoscopy.  Per NCCN guidelines, we are to see the patient every 3-6 months, HOWEVER, I think it is reasonable to see the patient a little more often during this time given his blood counts, his difficulties through therapy, and also for emotional and spiritual support.  In the not too distant future, based upon how the patient is doing, will space out appointments accordingly.     ORDERS PLACED FOR THIS ENCOUNTER: Orders Placed This Encounter  Procedures  . CT Abdomen Pelvis W Contrast  . CBC with Differential  . Comprehensive metabolic panel  . CEA    MEDICATIONS PRESCRIBED THIS ENCOUNTER: Meds ordered this encounter  Medications  . traMADol (ULTRAM) 50 MG tablet    Sig: May take 1-2 tabs every 6 hours as needed for pain    Dispense:  120 tablet    Refill:  0    Order Specific Question:  Supervising Provider    Answer:  Patrici Ranks [id number]  . HYDROcodone-acetaminophen (NORCO) 7.5-325 MG tablet    Sig: Take 1 tablet by mouth every 12 (twelve) hours as needed for moderate pain.    Dispense:  45 tablet    Refill:  0    Order Specific Question:  Supervising Provider    Answer:  Patrici Ranks [id number]    THERAPY PLAN:  NCCN guidelines regarding surveillance for rectal cancer are as follows (3. 2017):  1. Stage I with full surgical staging:   A. Colonoscopy 1 year    1. If advanced adenoma, repeat  in 1 year.    2. If no advanced adenoma, repeat in 3 years, then every 5 years  2. Stage II, III:   A. History and physical every 3-6 months for 2 years, then every 6 months for a total of 5 years.   B. CEA every 3-6 months for 2 years, then every 6 months for a total of 5 years.   C. Chest/abdominal/pelvic CT every 6-12 months (category 2b for frequency less than 12 months) for a total of 5 years.   D. Colonoscopy in 1 year except if no preoperative colonoscopy due to obstructing lesion, colonoscopy in 3-6 months.    1. If advanced adenoma, repeat in 1 year    2. If no advanced adenoma, repeat in 3 years, then every 5 years.   E. Proctoscopy (with EUS or MRI with contrast) every 3-6 months for the first 2 years, then every 6 months for total of 5 years (for patients treated with transanal excision only).   F. PET/CT scan is not recommended.  3. Stage IV:   A. History and physical every 3-6 months for 2 years, then every 6 months for a total of 5 years.   B. CEA every 3-6 months for 2 years, then every 6 months for a total of 5 years.   C. Chest/abdominal/pelvic CT every 3-6 months (category 2b for frequency less than 6 months) for 2 years, then every 6-12 months for a total of 5 years.   D. Colonoscopy in 1 year except if no preoperative colonoscopy due to obstructing lesion, colonoscopy in 3-6 months.    1. If advanced adenoma, repeat in 1 year    2. If no advanced adenoma, repeat in 3 years, then every 5 years.   E. Proctoscopy (with EUS or MRI with contrast) every 3-6 months for the first 2 years, then every 6 months  for total of 5 years (for patients treated with transanal excision only).   F. PET/CT scan is not recommended.         All questions were answered. The patient knows to call the clinic with any problems, questions or concerns. We can certainly see the patient much sooner if necessary.  Patient and plan discussed with Dr. Ancil Linsey and she is in agreement with the  aforementioned.   This note is electronically signed by: Doy Mince 04/24/2016 7:32 PM

## 2016-04-25 ENCOUNTER — Encounter: Payer: Self-pay | Admitting: Internal Medicine

## 2016-05-02 ENCOUNTER — Other Ambulatory Visit: Payer: Self-pay | Admitting: Licensed Clinical Social Worker

## 2016-05-02 ENCOUNTER — Encounter: Payer: Self-pay | Admitting: Licensed Clinical Social Worker

## 2016-05-02 NOTE — Patient Outreach (Signed)
Assessment:  CSW spoke via phone with client on 05/02/16. CSW verified client identity. CSW and client spoke of client needs. Client reported that he had his prescribed medications and is taking medications as prescribed. Client has a Lucianne Lei he uses for transport and he is driving himself to scheduled medical appointments. Client is eating well and sleeping adequately.  Client sees Dr. Legrand Rams as his primary care doctor.  CSW and client spoke of client care plan. CSW encouraged client to collect documents needed for client to apply for Medicaid. CSW encouraged client  to apply for Medicaid in next 30 days. CSW discussed with client some of the benefits he might utilize if he were approved for Medicaid. Client has some support from his son.  Client has Food Stamps benefit and said this benefit is helpful to him. Client said he goes one time monthly to [employer] for food support. Client reported he goes to [facility] 2 or 3 times weekly.  He enjoys support he receives at [employer]. Client said he uses a cane to help him ambulate. Client said that his son helps procure some food items needed for client. Client also said that his son sometimes goes with him to client scheduled medical appointments. Client said that his son is supportive of client.  CSW encouraged client to call CSW at [phone] as needed to discuss social work needs of client. CSW thanked client for phone call with CSW on 05/02/16.   Plan:  Client will collect documents needed to apply for Medicaid and client will apply for Medicaid in next 30 days. CSW to call client in 4 weeks to assess needs of client at that time.  [name].Janice Bodine MSW, LCSW Licensed Clinical Social Worker [facility] Care Management [phone]

## 2016-05-04 ENCOUNTER — Ambulatory Visit: Payer: Commercial Managed Care - HMO | Admitting: Licensed Clinical Social Worker

## 2016-05-09 ENCOUNTER — Encounter: Payer: Self-pay | Admitting: Nurse Practitioner

## 2016-05-09 ENCOUNTER — Other Ambulatory Visit: Payer: Self-pay

## 2016-05-09 ENCOUNTER — Ambulatory Visit (INDEPENDENT_AMBULATORY_CARE_PROVIDER_SITE_OTHER): Payer: Commercial Managed Care - HMO | Admitting: Nurse Practitioner

## 2016-05-09 VITALS — BP 141/77 | HR 92 | Temp 97.6°F | Ht 63.0 in | Wt 204.0 lb

## 2016-05-09 DIAGNOSIS — C20 Malignant neoplasm of rectum: Secondary | ICD-10-CM

## 2016-05-09 DIAGNOSIS — Z85048 Personal history of other malignant neoplasm of rectum, rectosigmoid junction, and anus: Secondary | ICD-10-CM | POA: Diagnosis not present

## 2016-05-09 MED ORDER — PEG 3350-KCL-NA BICARB-NACL 420 G PO SOLR: 4000.0000 mL | ORAL | Status: DC

## 2016-05-09 NOTE — Progress Notes (Signed)
Referring Provider: Rosita Fire, MD Primary Care Physician:  Rosita Fire, MD Primary GI:  Dr. Gala Romney  Chief Complaint  Patient presents with  . set up TCS    HPI:   [name] is a 55 y.o. male who presents for repeat colonoscopy. The patient was last seen in our office 03/24/2015 to schedule initial screening colonoscopy at age 22. He is generally asymptomatic from a GI standpoint and we proceeded with average risk, first-ever colonoscopy on monitored anesthesia care due to long-term, chronic medications. His colonoscopy was completed on 03/31/2015 which found difficult exam due to inadequate colon prep, markedly redundant capacious colon, noted 3-4 cm mass in the rectum 58 m in the anal verge which was centrally depressed with ulceration. Also noted a 5 mm sigmoid polyp, colonic diverticulosis, colon not completely seen due to poor prep. 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 He was referred to [facility] for a complete colonoscopy and endoscopic ultrasound of his mass which confirmed cancer. His referred to surgery and oncology at [facility]. Robotic-assisted lower anterior resection was completed on 07/05/2015. He was first seen by oncology 06/02/2015 noted to be likely early rectal carcinoma that would not likely require chemotherapy/radiation but I was all pending surgical pathology after resection. After surgery it was noted lymph node involvement which changed his staging to stage IIIa and was advised to undergo chemotherapy. He is since been seen oncology on a regular basis, with his last visit on 04/24/2016. He is status post chemotherapy and x-ray treatment as well as surgical resection. Follow-up imaging is to be forthcoming. He was referred back to [place] for repeat colonoscopy for surveillance.  Today he states he's doing good overall since finishing chemo,  radiation, and surgery. No longer with problems going to the bathroom, appetite has returned now that chemo/radiation is complete. Colonoscopy completed at [facility] 05/26/15 on propofol (NOT general anesthesia.) Denies abdominal pain, N/V, hematochezia, melena, fever, chills, unintentional weight loss. Has a bowel movement about 2-3 times a day depending on diet, consistent with [place] 4. Denies chest pain, dyspnea, dizziness, lightheadedness, syncope, near syncope. Denies any other upper or lower GI symptoms.  Past Medical History  Diagnosis Date  . Hypertension   . High cholesterol   . Sleep apnea   . Rectal cancer ([place])   . Depression   . Arthritis   . Diabetes mellitus     "Borderline"  . GERD (gastroesophageal reflux disease)     No weakness  . Neuropathy ([place])     "back missed up"  . Cancer [facility])     rectal    Past Surgical History  Procedure Laterality Date  . Back surgery    . Cholecystectomy    . Colonoscopy with propofol N/A 03/31/2015    Procedure: COLONOSCOPY WITH PROPOFOL at cecum 0842; withdrawal time=35minutes;  Surgeon: Daneil Dolin, MD;  Location: [facility];  Service: Endoscopy;  Laterality: N/A;  . Polypectomy  03/31/2015    Procedure: POLYPECTOMY;  Surgeon: Daneil Dolin, MD;  Location: [facility];  Service: Endoscopy;;  . Biopsy  03/31/2015    Procedure: BIOPSY;  Surgeon: Daneil Dolin, MD;  Location: [facility];  Service: Endoscopy;;  . Flexible sigmoidoscopy N/A 07/05/2015    Procedure: FLEXIBLE SIGMOIDOSCOPY;  Surgeon: [name], MD;  Location: [facility];  Service: Endoscopy;  Laterality: N/A;  with tattoo  . Xi robotic  assisted lower anterior resection N/A 07/06/2015    Procedure: XI ROBOTIC ASSISTED LOWER ANTERIOR RESECTION, rigid proctoscopy;  Surgeon: [name], MD;  Location: [facility];  Service: General;  Laterality: N/A;  . Portacath placement N/A 08/09/2015    Procedure: INSERTION PORT-A-CATH LEFT SUBCLAVIAN;  Surgeon: [name], MD;  Location: [place];   Service: General;  Laterality: N/A;    Current Outpatient Prescriptions  Medication Sig Dispense Refill  . ACCU-CHEK SMARTVIEW test strip     . amLODipine (NORVASC) 5 MG tablet Take 1 tablet (5 mg total) by mouth every morning. 30 tablet 2  . docusate sodium (COLACE) 100 MG capsule Take 100 mg by mouth daily.    Marland Kitchen gabapentin (NEURONTIN) 800 MG tablet Take 1 tablet (800 mg total) by mouth 3 (three) times daily. 90 tablet 2  . HYDROcodone-acetaminophen (NORCO) 7.5-325 MG tablet Take 1 tablet by mouth every 12 (twelve) hours as needed for moderate pain. 45 tablet 0  . lidocaine-prilocaine (EMLA) cream Apply a quarter size amount to port site 1 hour prior to chemo. Do not rub in. Cover with plastic wrap. 30 g 3  . lisinopril (PRINIVIL,ZESTRIL) 40 MG tablet Take 1 tablet (40 mg total) by mouth every morning. 30 tablet 2  . loperamide (IMODIUM A-D) 2 MG tablet Take 2 mg by mouth as needed for diarrhea or loose stools.    . NON FORMULARY PT HAS A C-PAP MACHINE    . nortriptyline (PAMELOR) 75 MG capsule Take 1 capsule (75 mg total) by mouth at bedtime. 30 capsule 2  . polyethylene glycol powder (GLYCOLAX/MIRALAX) powder Take 1 Container by mouth once. Reported on 05/09/2016    . tiZANidine (ZANAFLEX) 2 MG tablet Take 1 tablet (2 mg total) by mouth every 8 (eight) hours as needed for muscle spasms. 30 tablet 2  . traMADol (ULTRAM) 50 MG tablet May take 1-2 tabs every 6 hours as needed for pain 120 tablet 0  . [DISCONTINUED] prochlorperazine (COMPAZINE) 10 MG tablet Take 1 tablet (10 mg total) by mouth every 6 (six) hours as needed (Nausea or vomiting). (Patient not taking: Reported on 10/06/2015) 30 tablet 1   No current facility-administered medications for this visit.    Allergies as of 05/09/2016  . (No Known Allergies)    Family History  Problem Relation Age of Onset  . Colon cancer Neg Hx     "Not that I know of"  . Colon polyps Neg Hx     "not that I know of"  . Hypertension      "Not  sure who, I don't know much about my family"  . Diabetes      "Not sure who, I don't know much about my family"    Social History   Social History  . Marital Status: Legally Separated    Spouse Name: N/A  . Number of Children: N/A  . Years of Education: N/A   Social History Main Topics  . Smoking status: Former Smoker    Quit date: 03/23/2006  . Smokeless tobacco: Never Used  . Alcohol Use: No  . Drug Use: No  . Sexual Activity: Not Currently   Other Topics Concern  . None   Social History Narrative    Review of Systems: General: Negative for anorexia, weight loss, fever, chills, fatigue, weakness. ENT: Negative for hoarseness, difficulty swallowing. CV: Negative for chest pain, angina, palpitations, peripheral edema.  Respiratory: Negative for dyspnea at rest, cough, sputum, wheezing.  GI: See history of present illness.  Endo: Negative for unusual weight change.  Heme: Negative for bruising or bleeding.   Physical Exam: BP 141/77 mmHg  Pulse 92  Temp(Src) 97.6 F (36.4 C)  Ht 5\' 3"  (1.6 m)  Wt 204 lb (92.534 kg)  BMI 36.15 kg/m2 General:   Alert and oriented. Pleasant and cooperative. Well-nourished and well-developed.  Eyes:  Without icterus, sclera clear and conjunctiva pink.  Ears:  Normal auditory acuity. Cardiovascular:  S1, S2 present without murmurs appreciated. Extremities without clubbing or edema. Respiratory:  Clear to auscultation bilaterally. No wheezes, rales, or rhonchi. No distress.  Gastrointestinal:  +BS, rounded but soft, non-tender and non-distended. No HSM noted. No guarding or rebound. No masses appreciated.  Rectal:  Deferred  Musculoskalatal:  Symmetrical without gross deformities Skin:  Abdominal surgical incisions noted consistent with low anterior resection, well-healed. Neurologic:  Alert and oriented x4;  grossly normal neurologically. Psych:  Alert and cooperative. Normal mood and affect. Heme/Lymph/Immune: No excessive bruising  noted.    05/09/2016 10:34 AM   Disclaimer: This note was dictated with voice recognition software. Similar sounding words can inadvertently be transcribed and may not be corrected upon review.

## 2016-05-09 NOTE — Progress Notes (Signed)
cc'ed to pcp °

## 2016-05-09 NOTE — Assessment & Plan Note (Addendum)
Patient with a history of rectal cancer diagnosed last year. He is status post surgical resection, radiation, and chemotherapy. He is currently doing quite well. It is been 1 year since his colonoscopy and he is due for surveillance. His previous colonoscopy was noted here with a poor prep and difficulty maintaining insufflation due to persistent coughing during the procedure. He was sent to [place] for endoscopic ultrasound and repeat of a full colonoscopy, possibly under general anesthesia. Per the limited notes reviewed from [facility] it appears they did do procedure under propofol only. Patient states this one well. He seems to prefer to have the procedure locally. At this time we will proceed with surveillance colonoscopy with propofol/MAC. We'll provide for an extended prep to promote successful procedure. Return for follow-up based on postprocedure recommendations. Continue to see oncology as recommended.  The patient currently has 2 sons aged approximately 52 and 78 years old. Discussed the importance of them having early initial colonoscopies between the age of 41 and 34 due to her father's history of early-onset rectal cancer. He verbalized understanding and says he is are detox them about this.  Proceed with TCS with Dr. Gala Romney in near future: the risks, benefits, and alternatives have been discussed with the patient in detail. The patient states understanding and desires to proceed.  He is not currently on any anticoagulants, anxiolytics, or antidepressants. He is currently on pain medications including Neurontin, hydrocodone, and tramadol. We will proceed with scheduling with propofol/MAC to promote adequate sedation.

## 2016-05-09 NOTE — Patient Instructions (Signed)
1. We will schedule your procedure for you. 2. Return for follow-up based on recommendations made after your procedure. 

## 2016-05-11 DIAGNOSIS — C20 Malignant neoplasm of rectum: Secondary | ICD-10-CM | POA: Diagnosis not present

## 2016-05-11 DIAGNOSIS — E1165 Type 2 diabetes mellitus with hyperglycemia: Secondary | ICD-10-CM | POA: Diagnosis not present

## 2016-05-11 DIAGNOSIS — R7303 Prediabetes: Secondary | ICD-10-CM | POA: Diagnosis not present

## 2016-05-11 DIAGNOSIS — I10 Essential (primary) hypertension: Secondary | ICD-10-CM | POA: Diagnosis not present

## 2016-05-11 DIAGNOSIS — G4733 Obstructive sleep apnea (adult) (pediatric): Secondary | ICD-10-CM | POA: Diagnosis not present

## 2016-05-11 NOTE — Patient Instructions (Signed)
[name]  05/11/2016     @PREFPERIOPPHARMACY @   Your procedure is scheduled on  05/21/2016   Report to [facility] at  1230  P.M.  Call this number if you have problems the morning of surgery:  [phone]   Remember:  Do not eat food or drink liquids after midnight.  Take these medicines the morning of surgery with A SIP OF WATER  Amlodipine, neurontin, hydrocodoenr, lisinopril, zanaflex, ultram.   Do not wear jewelry, make-up or nail polish.  Do not wear lotions, powders, or perfumes.  You may wear deoderant.  Do not shave 48 hours prior to surgery.  Men may shave face and neck.  Do not bring valuables to the hospital.  [facility] is not responsible for any belongings or valuables.  Contacts, dentures or bridgework may not be worn into surgery.  Leave your suitcase in the car.  After surgery it may be brought to your room.  For patients admitted to the hospital, discharge time will be determined by your treatment team.  Patients discharged the day of surgery will not be allowed to drive home.   Name and phone number of your driver:   family Special instructions:  Follow the diet and prep instructions given to you by Dr Roseanne Kaufman office.  Please read over the following fact sheets that you were given. Coughing and Deep Breathing, Surgical Site Infection Prevention, Anesthesia Post-op Instructions and Care and Recovery After Surgery      Colonoscopy A colonoscopy is an exam to look at the entire large intestine (colon). This exam can help find problems such as tumors, polyps, inflammation, and areas of bleeding. The exam takes about 1 hour.  LET [facility] CARE PROVIDER KNOW ABOUT:   Any allergies you have.  All medicines you are taking, including vitamins, herbs, eye drops, creams, and over-the-counter medicines.  Previous problems you or members of your family have had with the use of anesthetics.  Any blood disorders you have.  Previous  surgeries you have had.  Medical conditions you have. RISKS AND COMPLICATIONS  Generally, this is a safe procedure. However, as with any procedure, complications can occur. Possible complications include:  Bleeding.  Tearing or rupture of the colon wall.  Reaction to medicines given during the exam.  Infection (rare). BEFORE THE PROCEDURE   Ask your health care provider about changing or stopping your regular medicines.  You may be prescribed an oral bowel prep. This involves drinking a large amount of medicated liquid, starting the day before your procedure. The liquid will cause you to have multiple loose stools until your stool is almost clear or light green. This cleans out your colon in preparation for the procedure.  Do not eat or drink anything else once you have started the bowel prep, unless your health care provider tells you it is safe to do so.  Arrange for someone to drive you home after the procedure. PROCEDURE   You will be given medicine to help you relax (sedative).  You will lie on your side with your knees bent.  A long, flexible tube with a light and camera on the end (colonoscope) will be inserted through the rectum and into the colon. The camera sends video back to a computer screen as it moves through the colon. The colonoscope also releases carbon dioxide gas to inflate the colon. This helps your health care provider see the area better.  During  the exam, your health care provider may take a small tissue sample (biopsy) to be examined under a microscope if any abnormalities are found.  The exam is finished when the entire colon has been viewed. AFTER THE PROCEDURE   Do not drive for 24 hours after the exam.  You may have a small amount of blood in your stool.  You may pass moderate amounts of gas and have mild abdominal cramping or bloating. This is caused by the gas used to inflate your colon during the exam.  Ask when your test results will be ready  and how you will get your results. Make sure you get your test results.   This information is not intended to replace advice given to you by your health care provider. Make sure you discuss any questions you have with your health care provider.   Document Released: 11/02/2000 Document Revised: 08/26/2013 Document Reviewed: 07/13/2013 Elsevier Interactive Patient Education 2016 Else[employer] Inc[employer] Colonoscopy, Care After Refer to this sheet in the next few weeks. These instructions provide you with information on caring for yourself after your procedure. Your health care provider may also give you more specific instructions. Your treatment has been planned according to current medical practices, but problems sometimes occur. Call your health care provider if you have any problems or questions after your procedure. WHAT TO EXPECT AFTER THE PROCEDURE  After your procedure, it is typical to have the following:  A small amount of blood in your stool.  Moderate amounts of gas and mild abdominal cramping or bloating. HOME CARE INSTRUCTIONS  Do not drive, operate machinery, or sign important documents for 24 hours.  You may shower and resume your regular physical activities, but move at a slower pace for the first 24 hours.  Take frequent rest periods for the first 24 hours.  Walk around or put a warm pack on your abdomen to help reduce abdominal cramping and bloating.  Drink enough fluids to keep your urine clear or pale yellow.  You may resume your normal diet as instructed by your health care provider. Avoid heavy or fried foods that are hard to digest.  Avoid drinking alcohol for 24 hours or as instructed by your health care provider.  Only take over-the-counter or prescription medicines as directed by your health care provider.  If a tissue sample (biopsy) was taken during your procedure:  Do not take aspirin or blood thinners for 7 days, or as instructed by your health care provider.  Do  not drink alcohol for 7 days, or as instructed by your health care provider.  Eat soft foods for the first 24 hours. SEEK MEDICAL CARE IF: You have persistent spotting of blood in your stool 2-3 days after the procedure. SEEK IMMEDIATE MEDICAL CARE IF:  You have more than a small spotting of blood in your stool.  You pass large blood clots in your stool.  Your abdomen is swollen (distended).  You have nausea or vomiting.  You have a fever.  You have increasing abdominal pain that is not relieved with medicine.   This information is not intended to replace advice given to you by your health care provider. Make sure you discuss any questions you have with your health care provider.   Document Released: 06/19/2004 Document Revised: 08/26/2013 Document Reviewed: 07/13/2013 Elsevier Interactive Patient Education 2016 Elsevier Inc. PATIENT INSTRUCTIONS POST-ANESTHESIA  IMMEDIATELY FOLLOWING SURGERY:  Do not drive or operate machinery for the first twenty four hours after surgery.  Do not  make any important decisions for twenty four hours after surgery or while taking narcotic pain medications or sedatives.  If you develop intractable nausea and vomiting or a severe headache please notify your doctor immediately.  FOLLOW-UP:  Please make an appointment with your surgeon as instructed. You do not need to follow up with anesthesia unless specifically instructed to do so.  WOUND CARE INSTRUCTIONS (if applicable):  Keep a dry clean dressing on the anesthesia/puncture wound site if there is drainage.  Once the wound has quit draining you may leave it open to air.  Generally you should leave the bandage intact for twenty four hours unless there is drainage.  If the epidural site drains for more than 36-48 hours please call the anesthesia department.  QUESTIONS?:  Please feel free to call your physician or the hospital operator if you have any questions, and they will be happy to assist you.

## 2016-05-14 ENCOUNTER — Encounter (HOSPITAL_COMMUNITY): Payer: Self-pay

## 2016-05-14 ENCOUNTER — Encounter (HOSPITAL_COMMUNITY)
Admission: RE | Admit: 2016-05-14 | Discharge: 2016-05-14 | Disposition: A | Discharge status: Home / Self Care | Payer: Commercial Managed Care - HMO | Source: Ambulatory Visit | Attending: Internal Medicine | Admitting: Internal Medicine

## 2016-05-14 DIAGNOSIS — Z01810 Encounter for preprocedural cardiovascular examination: Secondary | ICD-10-CM | POA: Diagnosis not present

## 2016-05-14 DIAGNOSIS — Z01812 Encounter for preprocedural laboratory examination: Secondary | ICD-10-CM | POA: Diagnosis not present

## 2016-05-14 LAB — CBC WITH DIFFERENTIAL/PLATELET
BASOS ABS: 0 10*3/uL (ref 0.0–0.1)
BASOS PCT: 1 %
EOS ABS: 0.5 10*3/uL (ref 0.0–0.7)
EOS PCT: 14 %
HCT: 39.4 % (ref 39.0–52.0)
HEMOGLOBIN: 13.2 g/dL (ref 13.0–17.0)
LYMPHS ABS: 0.7 10*3/uL (ref 0.7–4.0)
Lymphocytes Relative: 20 %
MCH: 30.3 pg (ref 26.0–34.0)
MCHC: 33.5 g/dL (ref 30.0–36.0)
MCV: 90.6 fL (ref 78.0–100.0)
Monocytes Absolute: 0.4 10*3/uL (ref 0.1–1.0)
Monocytes Relative: 12 %
NEUTROS PCT: 53 %
Neutro Abs: 1.9 10*3/uL (ref 1.7–7.7)
PLATELETS: 138 10*3/uL — AB (ref 150–400)
RBC: 4.35 MIL/uL (ref 4.22–5.81)
RDW: 11.8 % (ref 11.5–15.5)
WBC: 3.6 10*3/uL — AB (ref 4.0–10.5)

## 2016-05-14 LAB — BASIC METABOLIC PANEL
ANION GAP: 8 (ref 5–15)
BUN: 15 mg/dL (ref 6–20)
CHLORIDE: 104 mmol/L (ref 101–111)
CO2: 24 mmol/L (ref 22–32)
Calcium: 9.3 mg/dL (ref 8.9–10.3)
Creatinine, Ser: 0.89 mg/dL (ref 0.61–1.24)
Glucose, Bld: 138 mg/dL — ABNORMAL HIGH (ref 65–99)
POTASSIUM: 4.1 mmol/L (ref 3.5–5.1)
SODIUM: 136 mmol/L (ref 135–145)

## 2016-05-14 NOTE — Pre-Procedure Instructions (Signed)
Patient given information to sign up for my chart at home. 

## 2016-05-16 ENCOUNTER — Telehealth: Payer: Self-pay

## 2016-05-16 NOTE — Telephone Encounter (Signed)
Called pt inform him that the time of his TCS has been changed, but I had to [facility]

## 2016-05-21 ENCOUNTER — Encounter (HOSPITAL_COMMUNITY): Payer: Self-pay | Admitting: *Deleted

## 2016-05-21 ENCOUNTER — Ambulatory Visit (HOSPITAL_COMMUNITY): Payer: Commercial Managed Care - HMO | Admitting: Anesthesiology

## 2016-05-21 ENCOUNTER — Encounter (HOSPITAL_COMMUNITY)
Admission: RE | Disposition: A | Discharge status: Home / Self Care | Payer: Self-pay | Source: Ambulatory Visit | Attending: Internal Medicine

## 2016-05-21 ENCOUNTER — Other Ambulatory Visit (HOSPITAL_COMMUNITY): Payer: Self-pay | Admitting: Oncology

## 2016-05-21 ENCOUNTER — Ambulatory Visit (HOSPITAL_COMMUNITY)
Admission: RE | Admit: 2016-05-21 | Discharge: 2016-05-21 | Disposition: A | Discharge status: Home / Self Care | Payer: Commercial Managed Care - HMO | Source: Ambulatory Visit | Attending: Internal Medicine | Admitting: Internal Medicine

## 2016-05-21 DIAGNOSIS — C20 Malignant neoplasm of rectum: Secondary | ICD-10-CM

## 2016-05-21 DIAGNOSIS — Z87891 Personal history of nicotine dependence: Secondary | ICD-10-CM | POA: Insufficient documentation

## 2016-05-21 DIAGNOSIS — Z538 Procedure and treatment not carried out for other reasons: Secondary | ICD-10-CM | POA: Diagnosis not present

## 2016-05-21 DIAGNOSIS — I10 Essential (primary) hypertension: Secondary | ICD-10-CM | POA: Insufficient documentation

## 2016-05-21 DIAGNOSIS — E119 Type 2 diabetes mellitus without complications: Secondary | ICD-10-CM | POA: Diagnosis not present

## 2016-05-21 DIAGNOSIS — Z85038 Personal history of other malignant neoplasm of large intestine: Secondary | ICD-10-CM

## 2016-05-21 DIAGNOSIS — F329 Major depressive disorder, single episode, unspecified: Secondary | ICD-10-CM | POA: Diagnosis not present

## 2016-05-21 DIAGNOSIS — Z9221 Personal history of antineoplastic chemotherapy: Secondary | ICD-10-CM | POA: Insufficient documentation

## 2016-05-21 DIAGNOSIS — Z1211 Encounter for screening for malignant neoplasm of colon: Secondary | ICD-10-CM | POA: Diagnosis not present

## 2016-05-21 DIAGNOSIS — Z85048 Personal history of other malignant neoplasm of rectum, rectosigmoid junction, and anus: Secondary | ICD-10-CM | POA: Diagnosis not present

## 2016-05-21 DIAGNOSIS — G4730 Sleep apnea, unspecified: Secondary | ICD-10-CM | POA: Diagnosis not present

## 2016-05-21 HISTORY — PX: COLONOSCOPY WITH PROPOFOL: SHX5780

## 2016-05-21 LAB — GLUCOSE, CAPILLARY
GLUCOSE-CAPILLARY: 96 mg/dL (ref 65–99)
GLUCOSE-CAPILLARY: 80 mg/dL (ref 65–99)

## 2016-05-21 SURGERY — COLONOSCOPY WITH PROPOFOL
Anesthesia: Monitor Anesthesia Care

## 2016-05-21 MED ORDER — FENTANYL CITRATE (PF) 100 MCG/2ML IJ SOLN
INTRAMUSCULAR | Status: AC
  Filled 2016-05-21: qty 2

## 2016-05-21 MED ORDER — MIDAZOLAM HCL 2 MG/2ML IJ SOLN
1.0000 mg | INTRAMUSCULAR | Status: DC | PRN
Start: 2016-05-21 — End: 2016-05-21
  Administered 2016-05-21: 2 mg via INTRAVENOUS

## 2016-05-21 MED ORDER — FENTANYL CITRATE (PF) 100 MCG/2ML IJ SOLN
INTRAMUSCULAR | Status: DC | PRN
  Administered 2016-05-21: 75 ug via INTRAVENOUS

## 2016-05-21 MED ORDER — LACTATED RINGERS IV SOLN
INTRAVENOUS | Status: DC
  Administered 2016-05-21: 1000 mL via INTRAVENOUS

## 2016-05-21 MED ORDER — FENTANYL CITRATE (PF) 100 MCG/2ML IJ SOLN: 25.0000 ug | INTRAMUSCULAR | Status: DC | PRN

## 2016-05-21 MED ORDER — HYDROCODONE-ACETAMINOPHEN 7.5-325 MG PO TABS: 1.0000 | ORAL_TABLET | Freq: Two times a day (BID) | ORAL | Status: DC | PRN

## 2016-05-21 MED ORDER — FENTANYL CITRATE (PF) 100 MCG/2ML IJ SOLN
25.0000 ug | INTRAMUSCULAR | Status: AC | PRN
  Administered 2016-05-21 (×2): 25 ug via INTRAVENOUS

## 2016-05-21 MED ORDER — LIDOCAINE HCL (CARDIAC) 10 MG/ML IV SOLN
INTRAVENOUS | Status: DC | PRN
  Administered 2016-05-21: 25 mg via INTRAVENOUS

## 2016-05-21 MED ORDER — MIDAZOLAM HCL 2 MG/2ML IJ SOLN
INTRAMUSCULAR | Status: AC
  Filled 2016-05-21: qty 2

## 2016-05-21 MED ORDER — TRAMADOL HCL 50 MG PO TABS: ORAL_TABLET | ORAL | Status: DC

## 2016-05-21 MED ORDER — LIDOCAINE HCL (PF) 1 % IJ SOLN
INTRAMUSCULAR | Status: AC
  Filled 2016-05-21: qty 5

## 2016-05-21 MED ORDER — ONDANSETRON HCL 4 MG/2ML IJ SOLN: 4.0000 mg | Freq: Once | INTRAMUSCULAR | Status: DC | PRN

## 2016-05-21 MED ORDER — MIDAZOLAM HCL 5 MG/5ML IJ SOLN
INTRAMUSCULAR | Status: DC | PRN
  Administered 2016-05-21: 2 mg via INTRAVENOUS

## 2016-05-21 MED ORDER — PROPOFOL 500 MG/50ML IV EMUL
INTRAVENOUS | Status: DC | PRN
  Administered 2016-05-21: 150 ug/kg/min via INTRAVENOUS

## 2016-05-21 NOTE — Transfer of Care (Signed)
Immediate Anesthesia Transfer of Care Note  Patient: [name]  Procedure(s) Performed: Procedure(s) with comments: COLONOSCOPY WITH PROPOFOL (N/A) - 200 - moved to 12:45 - office calling pt with 11:15 arrival time  Patient Location: PACU  Anesthesia Type:MAC  Level of Consciousness: awake and patient cooperative  Airway & Oxygen Therapy: Patient Spontanous Breathing and Patient connected to face mask oxygen  Post-op Assessment: Report given to RN and Post -op Vital signs reviewed and stable  Post vital signs: Reviewed and stable  Last Vitals:  Filed Vitals:   05/21/16 1115 05/21/16 1117  BP:  123/81  Pulse:  96  Temp: 36.6 C   Resp:  21    Last Pain: There were no vitals filed for this visit.    Patients Stated Pain Goal: 7 ([id number])  Complications: No apparent anesthesia complications

## 2016-05-21 NOTE — Interval H&P Note (Signed)
History and Physical Interval Note:  05/21/2016 11:25 AM  [name]  has presented today for surgery, with the diagnosis of RECTAL CANCER  The various methods of treatment have been discussed with the patient and family. After consideration of risks, benefits and other options for treatment, the patient has consented to  Procedure(s) with comments: COLONOSCOPY WITH PROPOFOL (N/A) - 200 - moved to 12:45 - office calling pt with 11:15 arrival time as a surgical intervention .  The patient's history has been reviewed, patient examined, no change in status, stable for surgery.  I have reviewed the patient's chart and labs.  Questions were answered to the patient's satisfaction.     [name]  No change. Surveillance colonoscopy per plan. The risks, benefits, limitations, alternatives and imponderables have been reviewed with the patient. Questions have been answered. All parties are agreeable.

## 2016-05-21 NOTE — Anesthesia Preprocedure Evaluation (Signed)
Anesthesia Evaluation  Patient identified by MRN, date of birth, ID band Patient awake    Reviewed: Allergy & Precautions, NPO status , Patient's Chart, lab work & pertinent test results  Airway Mallampati: III  TM Distance: >3 FB     Dental  (+) Teeth Intact   Pulmonary sleep apnea , former smoker,    breath sounds clear to auscultation       Cardiovascular hypertension, Pt. on medications  Rhythm:Regular Rate:Normal     Neuro/Psych PSYCHIATRIC DISORDERS Depression    GI/Hepatic neg GERD  ,  Endo/Other  diabetes ( borderline - no meds)  Renal/GU      Musculoskeletal   Abdominal   Peds  Hematology   Anesthesia Other Findings   Reproductive/Obstetrics                             Anesthesia Physical Anesthesia Plan  ASA: III  Anesthesia Plan: MAC   Post-op Pain Management:    Induction: Intravenous  Airway Management Planned: Simple Face Mask  Additional Equipment:   Intra-op Plan:   Post-operative Plan:   Informed Consent: I have reviewed the patients History and Physical, chart, labs and discussed the procedure including the risks, benefits and alternatives for the proposed anesthesia with the patient or authorized representative who has indicated his/her understanding and acceptance.     Plan Discussed with:   Anesthesia Plan Comments:         Anesthesia Quick Evaluation

## 2016-05-21 NOTE — Discharge Instructions (Signed)
°  Colonoscopy Discharge Instructions  Read the instructions outlined below and refer to this sheet in the next few weeks. These discharge instructions provide you with general information on caring for yourself after you leave the hospital. Your doctor may also give you specific instructions. While your treatment has been planned according to the most current medical practices available, unavoidable complications occasionally occur. If you have any problems or questions after discharge, call Dr. Gala Romney at [phone]. ACTIVITY  You may resume your regular activity, but move at a slower pace for the next 24 hours.   Take frequent rest periods for the next 24 hours.   Walking will help get rid of the air and reduce the bloated feeling in your belly (abdomen).   No driving for 24 hours (because of the medicine (anesthesia) used during the test).    Do not sign any important legal documents or operate any machinery for 24 hours (because of the anesthesia used during the test).  NUTRITION  Drink plenty of fluids.   You may resume your normal diet as instructed by your doctor.   Begin with a light meal and progress to your normal diet. Heavy or fried foods are harder to digest and may make you feel sick to your stomach (nauseated).   Avoid alcoholic beverages for 24 hours or as instructed.  MEDICATIONS  You may resume your normal medications unless your doctor tells you otherwise.  WHAT YOU CAN EXPECT TODAY  Some feelings of bloating in the abdomen.   Passage of more gas than usual.   Spotting of blood in your stool or on the toilet paper.  IF YOU HAD POLYPS REMOVED DURING THE COLONOSCOPY:  No aspirin products for 7 days or as instructed.   No alcohol for 7 days or as instructed.   Eat a soft diet for the next 24 hours.  FINDING OUT THE RESULTS OF YOUR TEST Not all test results are available during your visit. If your test results are not back during the visit, make an appointment  with your caregiver to find out the results. Do not assume everything is normal if you have not heard from your caregiver or the medical facility. It is important for you to follow up on all of your test results.  SEEK IMMEDIATE MEDICAL ATTENTION IF:  You have more than a spotting of blood in your stool.   Your belly is swollen (abdominal distention).   You are nauseated or vomiting.   You have a temperature over 101.   You have abdominal pain or discomfort that is severe or gets worse throughout the day.     Your preparation was poor likely from not following preparation instructions.  We will schedule a follow-up appointment in one month to plan for another attempt at colonoscopy.Marland Kitchen

## 2016-05-21 NOTE — H&P (View-Only) (Signed)
Referring Provider: Rosita Fire, MD Primary Care Physician:  Rosita Fire, MD Primary GI:  Dr. Gala Romney  Chief Complaint  Patient presents with  . set up TCS    HPI:   [name] is a 55 y.o. male who presents for repeat colonoscopy. The patient was last seen in our office 03/24/2015 to schedule initial screening colonoscopy at age 65. He is generally asymptomatic from a GI standpoint and we proceeded with average risk, first-ever colonoscopy on monitored anesthesia care due to long-term, chronic medications. His colonoscopy was completed on 03/31/2015 which found difficult exam due to inadequate colon prep, markedly redundant capacious colon, noted 3-4 cm mass in the rectum 54 m in the anal verge which was centrally depressed with ulceration. Also noted a 5 mm sigmoid polyp, colonic diverticulosis, colon not completely seen due to poor prep. 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 He was referred to [facility] for a complete colonoscopy and endoscopic ultrasound of his mass which confirmed cancer. His referred to surgery and oncology at [facility]. Robotic-assisted lower anterior resection was completed on 07/05/2015. He was first seen by oncology 06/02/2015 noted to be likely early rectal carcinoma that would not likely require chemotherapy/radiation but I was all pending surgical pathology after resection. After surgery it was noted lymph node involvement which changed his staging to stage IIIa and was advised to undergo chemotherapy. He is since been seen oncology on a regular basis, with his last visit on 04/24/2016. He is status post chemotherapy and x-ray treatment as well as surgical resection. Follow-up imaging is to be forthcoming. He was referred back to [place] for repeat colonoscopy for surveillance.  Today he states he's doing good overall since finishing chemo,  radiation, and surgery. No longer with problems going to the bathroom, appetite has returned now that chemo/radiation is complete. Colonoscopy completed at [facility] 05/26/15 on propofol (NOT general anesthesia.) Denies abdominal pain, N/V, hematochezia, melena, fever, chills, unintentional weight loss. Has a bowel movement about 2-3 times a day depending on diet, consistent with [place] 4. Denies chest pain, dyspnea, dizziness, lightheadedness, syncope, near syncope. Denies any other upper or lower GI symptoms.  Past Medical History  Diagnosis Date  . Hypertension   . High cholesterol   . Sleep apnea   . Rectal cancer ([place])   . Depression   . Arthritis   . Diabetes mellitus     "Borderline"  . GERD (gastroesophageal reflux disease)     No weakness  . Neuropathy ([place])     "back missed up"  . Cancer [facility])     rectal    Past Surgical History  Procedure Laterality Date  . Back surgery    . Cholecystectomy    . Colonoscopy with propofol N/A 03/31/2015    Procedure: COLONOSCOPY WITH PROPOFOL at cecum 0842; withdrawal time=12minutes;  Surgeon: Daneil Dolin, MD;  Location: [facility];  Service: Endoscopy;  Laterality: N/A;  . Polypectomy  03/31/2015    Procedure: POLYPECTOMY;  Surgeon: Daneil Dolin, MD;  Location: [facility];  Service: Endoscopy;;  . Biopsy  03/31/2015    Procedure: BIOPSY;  Surgeon: Daneil Dolin, MD;  Location: [facility];  Service: Endoscopy;;  . Flexible sigmoidoscopy N/A 07/05/2015    Procedure: FLEXIBLE SIGMOIDOSCOPY;  Surgeon: [name], MD;  Location: [facility];  Service: Endoscopy;  Laterality: N/A;  with tattoo  . Xi robotic  assisted lower anterior resection N/A 07/06/2015    Procedure: XI ROBOTIC ASSISTED LOWER ANTERIOR RESECTION, rigid proctoscopy;  Surgeon: [name], MD;  Location: [facility];  Service: General;  Laterality: N/A;  . Portacath placement N/A 08/09/2015    Procedure: INSERTION PORT-A-CATH LEFT SUBCLAVIAN;  Surgeon: [name], MD;  Location: [place];   Service: General;  Laterality: N/A;    Current Outpatient Prescriptions  Medication Sig Dispense Refill  . ACCU-CHEK SMARTVIEW test strip     . amLODipine (NORVASC) 5 MG tablet Take 1 tablet (5 mg total) by mouth every morning. 30 tablet 2  . docusate sodium (COLACE) 100 MG capsule Take 100 mg by mouth daily.    Marland Kitchen gabapentin (NEURONTIN) 800 MG tablet Take 1 tablet (800 mg total) by mouth 3 (three) times daily. 90 tablet 2  . HYDROcodone-acetaminophen (NORCO) 7.5-325 MG tablet Take 1 tablet by mouth every 12 (twelve) hours as needed for moderate pain. 45 tablet 0  . lidocaine-prilocaine (EMLA) cream Apply a quarter size amount to port site 1 hour prior to chemo. Do not rub in. Cover with plastic wrap. 30 g 3  . lisinopril (PRINIVIL,ZESTRIL) 40 MG tablet Take 1 tablet (40 mg total) by mouth every morning. 30 tablet 2  . loperamide (IMODIUM A-D) 2 MG tablet Take 2 mg by mouth as needed for diarrhea or loose stools.    . NON FORMULARY PT HAS A C-PAP MACHINE    . nortriptyline (PAMELOR) 75 MG capsule Take 1 capsule (75 mg total) by mouth at bedtime. 30 capsule 2  . polyethylene glycol powder (GLYCOLAX/MIRALAX) powder Take 1 Container by mouth once. Reported on 05/09/2016    . tiZANidine (ZANAFLEX) 2 MG tablet Take 1 tablet (2 mg total) by mouth every 8 (eight) hours as needed for muscle spasms. 30 tablet 2  . traMADol (ULTRAM) 50 MG tablet May take 1-2 tabs every 6 hours as needed for pain 120 tablet 0  . [DISCONTINUED] prochlorperazine (COMPAZINE) 10 MG tablet Take 1 tablet (10 mg total) by mouth every 6 (six) hours as needed (Nausea or vomiting). (Patient not taking: Reported on 10/06/2015) 30 tablet 1   No current facility-administered medications for this visit.    Allergies as of 05/09/2016  . (No Known Allergies)    Family History  Problem Relation Age of Onset  . Colon cancer Neg Hx     "Not that I know of"  . Colon polyps Neg Hx     "not that I know of"  . Hypertension      "Not  sure who, I don't know much about my family"  . Diabetes      "Not sure who, I don't know much about my family"    Social History   Social History  . Marital Status: Legally Separated    Spouse Name: N/A  . Number of Children: N/A  . Years of Education: N/A   Social History Main Topics  . Smoking status: Former Smoker    Quit date: 03/23/2006  . Smokeless tobacco: Never Used  . Alcohol Use: No  . Drug Use: No  . Sexual Activity: Not Currently   Other Topics Concern  . None   Social History Narrative    Review of Systems: General: Negative for anorexia, weight loss, fever, chills, fatigue, weakness. ENT: Negative for hoarseness, difficulty swallowing. CV: Negative for chest pain, angina, palpitations, peripheral edema.  Respiratory: Negative for dyspnea at rest, cough, sputum, wheezing.  GI: See history of present illness.  Endo: Negative for unusual weight change.  Heme: Negative for bruising or bleeding.   Physical Exam: BP 141/77 mmHg  Pulse 92  Temp(Src) 97.6 F (36.4 C)  Ht 5\' 3"  (1.6 m)  Wt 204 lb (92.534 kg)  BMI 36.15 kg/m2 General:   Alert and oriented. Pleasant and cooperative. Well-nourished and well-developed.  Eyes:  Without icterus, sclera clear and conjunctiva pink.  Ears:  Normal auditory acuity. Cardiovascular:  S1, S2 present without murmurs appreciated. Extremities without clubbing or edema. Respiratory:  Clear to auscultation bilaterally. No wheezes, rales, or rhonchi. No distress.  Gastrointestinal:  +BS, rounded but soft, non-tender and non-distended. No HSM noted. No guarding or rebound. No masses appreciated.  Rectal:  Deferred  Musculoskalatal:  Symmetrical without gross deformities Skin:  Abdominal surgical incisions noted consistent with low anterior resection, well-healed. Neurologic:  Alert and oriented x4;  grossly normal neurologically. Psych:  Alert and cooperative. Normal mood and affect. Heme/Lymph/Immune: No excessive bruising  noted.    05/09/2016 10:34 AM   Disclaimer: This note was dictated with voice recognition software. Similar sounding words can inadvertently be transcribed and may not be corrected upon review.

## 2016-05-21 NOTE — Anesthesia Postprocedure Evaluation (Signed)
Anesthesia Post Note Late entryPatient: [name]  Procedure(s) Performed: Procedure(s) (LRB): COLONOSCOPY WITH PROPOFOL (N/A)  Patient location during evaluation: PACU Anesthesia Type: MAC Level of consciousness: awake and oriented Pain management: pain level controlled Vital Signs Assessment: post-procedure vital signs reviewed and stable Respiratory status: spontaneous breathing Cardiovascular status: stable Postop Assessment: no signs of nausea or vomiting Anesthetic complications: no    Last Vitals:  Filed Vitals:   05/21/16 1215 05/21/16 1223  BP: 122/94 132/91  Pulse: 93 92  Temp:  36.9 C  Resp: 16 16    Last Pain: There were no vitals filed for this visit.               ADAMS, AMY A

## 2016-05-21 NOTE — Op Note (Signed)
[facility] Patient Name: [name] Procedure Date: 05/21/2016 11:11 AM MRN: [id number] Date of Birth: [birth year] Attending MD: Norvel Richards , MD CSN: [id number] Age: 55 Admit Type: Outpatient Procedure:                Colonoscopy- attempted; Surveillance examination. Indications:              High risk colon cancer surveillance: Personal                            history of colon cancer Providers:                Norvel Richards, MD, Otis Peak B. Sharon Seller, RN,                            Randa Spike, Technician Referring MD:              Medicines:                Propofol per Anesthesia Complications:            No immediate complications. Estimated Blood Loss:     Estimated blood loss: none. Procedure:                Pre-Anesthesia Assessment:                           - Prior to the procedure, a History and Physical                            was performed, and patient medications and                            allergies were reviewed. The patient's tolerance of                            previous anesthesia was also reviewed. The risks                            and benefits of the procedure and the sedation                            options and risks were discussed with the patient.                            All questions were answered, and informed consent                            was obtained. Prior Anticoagulants: The patient has                            taken no previous anticoagulant or antiplatelet                            agents. ASA Grade Assessment: III - A patient with  severe systemic disease. After reviewing the risks                            and benefits, the patient was deemed in                            satisfactory condition to undergo the procedure.                           After obtaining informed consent, the colonoscope                            was passed under direct vision. Throughout the                   procedure, the patient's blood pressure, pulse, and                            oxygen saturations were monitored continuously. The                            [id number]) was introduced through the anus                            with the intention of advancing to the ascending                            colon. The scope was advanced to the descending                            colon before the procedure was aborted. Medications                            were not given. The colonoscopy was aborted due to                            inadequate bowel prep. The quality of the bowel                            preparation was inadequate. No anatomical landmarks                            were photographed. Scope In: 11:39:15 AM Scope Out: 11:41:36 AM Total Procedure Duration: 0 hours 2 minutes 21 seconds  Findings:      Poor/inadequate colon preparation precluded completion of examination       today?"patient admits to eating at least applesauce yesterday which was       in conflict with her written and verbal prep instructions. Impression:               - The procedure was aborted due to inadequate bowel                            prep. Patient readily admits to eating at least  applesauce yesterday which was in conflict with our                            written and verbal preparation instructions.                           - No specimens collected. Moderate Sedation:      Moderate (conscious) sedation was personally administered by an       anesthesia professional. The following parameters were monitored: oxygen       saturation, heart rate, blood pressure, respiratory rate, EKG, adequacy       of pulmonary ventilation, and response to care. Total physician       intraservice time was 15 minutes. Recommendation:           - Patient has a contact number available for                            emergencies. The signs and symptoms of  potential                            delayed complications were discussed with the                            patient. Return to normal activities tomorrow.                            Written discharge instructions were provided to the                            patient.                           - Advance diet as tolerated.                           - Continue present medications.                           - Repeat colonoscopy in 1 month for surveillance.                           - Return to GI clinic in 1 month. Procedure Code(s):        --- Professional ---                           [phone], 94, Colonoscopy, flexible; diagnostic,                            including collection of specimen(s) by brushing or                            washing, when performed (separate procedure) Diagnosis Code(s):        --- Professional ---                           [id number], Personal  history of other malignant                            neoplasm of large intestine                           Z53.8, Procedure and treatment not carried out for                            other reasons CPT copyright 2016 American Medical Association. All rights reserved. The codes documented in this report are preliminary and upon coder review may  be revised to meet current compliance requirements. Cristopher Estimable. Anarely Nicholls, MD Norvel Richards, MD 05/21/2016 11:51:49 AM This report has been signed electronically. Number of Addenda: 0

## 2016-05-23 NOTE — Addendum Note (Signed)
Addendum  created 05/23/16 [id number] by Mickel Baas, CRNA   Modules edited: Anesthesia Flowsheet, Charges VN

## 2016-05-24 ENCOUNTER — Encounter (HOSPITAL_COMMUNITY): Payer: Self-pay | Admitting: Internal Medicine

## 2016-05-28 ENCOUNTER — Other Ambulatory Visit: Payer: Self-pay | Admitting: Licensed Clinical Social Worker

## 2016-05-28 NOTE — Patient Outreach (Signed)
Assessment:  CSW spoke via phone with client on 05/28/16. CSW verified client identity. CSW and client spoke of client needs. Client said he had his prescribed medications and is taking medications as prescribed. Client said he drives himself to his medical appointments in his [name]. Client has support from his son. His son helps client procure needed food items. Client is eating well and sleeping well. Client sees Dr. Legrand Rams as his primary care doctor.  Client said he had an appointment with Dr. Legrand Rams last month. CSW and client spoke of client care plan. CSW encouraged client to collect needed documents for client to apply for Medicaid. CSW and client have talked for several months about client applying for Medicaid benefit. Client said he goes one time monthly to local [employer] pantry to seek food assistance. Client said he has Food Stamps benefit and that this benefit is helpful to him. Client said he uses a cane to help him ambulate. He said he goes to [employer] [facility] 2 of 3 times weekly for activities and to socialize. He said he enjoys going to [facility].  Client said that his son is supportive and often goes with client to scheduled client medical appointments. Client said he had not applied for Medicaid as yet.  CSW offered to talk more with client about Medicaid application process for client. Client was appreciative of phone call from [place] on 05/28/16.   Plan:  Client to collect needed documents for client to apply for Medicaid and client to apply for Medicaid in next 30 days.. CSW to call client in 4 weeks to assess client needs.  [name].Sephiroth Mcluckie MSW, LCSW Licensed Clinical Social Worker [facility] Care Management [phone]

## 2016-05-29 ENCOUNTER — Telehealth: Payer: Self-pay | Admitting: Nurse Practitioner

## 2016-05-29 NOTE — Telephone Encounter (Signed)
Route to [place] to make apointment

## 2016-05-29 NOTE — Telephone Encounter (Signed)
Received message from Dr. Gala Romney, unable to complete TCS due to patient not following directions related to prep. Please reschedule OV to discuss and set-up repeat TCS.

## 2016-05-29 NOTE — Telephone Encounter (Signed)
Patient scheduled for follow up visit.

## 2016-06-05 ENCOUNTER — Encounter (HOSPITAL_COMMUNITY): Payer: Self-pay

## 2016-06-05 ENCOUNTER — Encounter (HOSPITAL_COMMUNITY): Payer: Commercial Managed Care - HMO | Attending: Hematology & Oncology

## 2016-06-05 VITALS — BP 124/82 | HR 98 | Resp 18

## 2016-06-05 DIAGNOSIS — Z452 Encounter for adjustment and management of vascular access device: Secondary | ICD-10-CM | POA: Diagnosis not present

## 2016-06-05 DIAGNOSIS — C20 Malignant neoplasm of rectum: Secondary | ICD-10-CM | POA: Insufficient documentation

## 2016-06-05 DIAGNOSIS — Z95828 Presence of other vascular implants and grafts: Secondary | ICD-10-CM

## 2016-06-05 MED ORDER — SODIUM CHLORIDE 0.9% FLUSH
10.0000 mL | INTRAVENOUS | Status: DC | PRN
  Administered 2016-06-05: 10 mL via INTRAVENOUS
  Filled 2016-06-05: qty 10

## 2016-06-05 MED ORDER — HEPARIN SOD (PORK) LOCK FLUSH 100 UNIT/ML IV SOLN
500.0000 [IU] | Freq: Once | INTRAVENOUS | Status: AC
  Administered 2016-06-05: 500 [IU] via INTRAVENOUS
  Filled 2016-06-05: qty 5

## 2016-06-05 NOTE — Progress Notes (Signed)
[name] presented for Portacath access and flush.  Proper placement of portacath confirmed by CXR.  Portacath located left chest wall accessed with  H 20 needle.  Good blood return present. Portacath flushed with 66ml NS and 500U/15ml Heparin and needle removed intact.  Procedure tolerated well and without incident.

## 2016-06-05 NOTE — Patient Instructions (Signed)
[facility] at [facility] Discharge Instructions  RECOMMENDATIONS MADE BY THE CONSULTANT AND ANY TEST RESULTS WILL BE SENT TO YOUR REFERRING PHYSICIAN.   Port flush today Follow up as scheduled Please call the clinic if you have any questions or concerns     Thank you for choosing [facility] at [facility] to provide your oncology and hematology care.  To afford each patient quality time with our provider, please arrive at least 15 minutes before your scheduled appointment time.   Beginning January 23rd 2017 lab work for the Cancer Center will be done in the  Main lab at [facility] on 1st floor. If you have a lab appointment with the Cancer Center please come in thru the  Main Entrance and check in at the main information desk  You need to re-schedule your appointment should you arrive 10 or more minutes late.  We strive to give you quality time with our providers, and arriving late affects you and other patients whose appointments are after yours.  Also, if you no show three or more times for appointments you may be dismissed from the clinic at the providers discretion.     Again, thank you for choosing [facility].  Our hope is that these requests will decrease the amount of time that you wait before being seen by our physicians.       _____________________________________________________________  Should you have questions after your visit to [facility], please contact our office at [phone] between the hours of 8:30 a.m. and 4:30 p.m.  Voicemails left after 4:30 p.m. will not be returned until the following business day.  For prescription refill requests, have your pharmacy contact our office.         Resources For Cancer Patients and their Caregivers ? American Cancer Society: Can assist with transportation, wigs, general needs, runs Look Good Feel Better.        1-888-227-6333 ? Cancer  Care: Provides financial assistance, online support groups, medication/co-pay assistance.  1-800-813-HOPE (4673) ? [facility] Assists [place] cancer patients and their families through emotional , educational and financial support.  336-427-4357 ? [place] DSS Where to apply for food stamps, Medicaid and utility assistance. 336-342-1394 ? [facility]: Transportation to medical appointments. 336-347-2287 ? Social Security Administration: May apply for disability if have a Stage IV cancer. 336-342-7796 1-800-772-1213 ? [place] Aging, Disability and Transit Services: Assists with nutrition, care and transit needs. 336-349-2343  Cancer Center Support Programs: @10RELATIVEDAYS@ > Cancer Support Group  2nd Tuesday of the month 1pm-2pm, Journey Room  > Creative Journey  3rd Tuesday of the month 1130am-1pm, Journey Room  > Look Good Feel Better  1st Wednesday of the month 10am-12 noon, Journey Room (Call American Cancer Society to register 1-800-395-5775)    

## 2016-06-19 ENCOUNTER — Other Ambulatory Visit (HOSPITAL_COMMUNITY): Payer: Self-pay | Admitting: Oncology

## 2016-06-19 ENCOUNTER — Ambulatory Visit (HOSPITAL_COMMUNITY)
Admission: RE | Admit: 2016-06-19 | Discharge: 2016-06-19 | Disposition: A | Discharge status: Home / Self Care | Payer: Commercial Managed Care - HMO | Source: Ambulatory Visit | Attending: Oncology | Admitting: Oncology

## 2016-06-19 DIAGNOSIS — C20 Malignant neoplasm of rectum: Secondary | ICD-10-CM | POA: Insufficient documentation

## 2016-06-19 DIAGNOSIS — Z85048 Personal history of other malignant neoplasm of rectum, rectosigmoid junction, and anus: Secondary | ICD-10-CM | POA: Diagnosis not present

## 2016-06-19 MED ORDER — TRAMADOL HCL 50 MG PO TABS: ORAL_TABLET | ORAL | 0 refills | Status: DC

## 2016-06-19 MED ORDER — HYDROCODONE-ACETAMINOPHEN 7.5-325 MG PO TABS: 1.0000 | ORAL_TABLET | Freq: Two times a day (BID) | ORAL | 0 refills | Status: DC | PRN

## 2016-06-19 MED ORDER — IOPAMIDOL (ISOVUE-300) INJECTION 61%
100.0000 mL | Freq: Once | INTRAVENOUS | Status: AC | PRN
  Administered 2016-06-19: 100 mL via INTRAVENOUS

## 2016-06-20 ENCOUNTER — Other Ambulatory Visit (HOSPITAL_COMMUNITY): Payer: Commercial Managed Care - HMO

## 2016-06-20 ENCOUNTER — Ambulatory Visit (HOSPITAL_COMMUNITY): Payer: Commercial Managed Care - HMO | Admitting: Hematology & Oncology

## 2016-06-21 ENCOUNTER — Encounter (HOSPITAL_BASED_OUTPATIENT_CLINIC_OR_DEPARTMENT_OTHER): Payer: Commercial Managed Care - HMO

## 2016-06-21 ENCOUNTER — Encounter (HOSPITAL_COMMUNITY): Payer: Commercial Managed Care - HMO | Attending: Oncology | Admitting: Oncology

## 2016-06-21 ENCOUNTER — Encounter (HOSPITAL_COMMUNITY): Payer: Self-pay | Admitting: Oncology

## 2016-06-21 DIAGNOSIS — C20 Malignant neoplasm of rectum: Secondary | ICD-10-CM | POA: Insufficient documentation

## 2016-06-21 DIAGNOSIS — Z452 Encounter for adjustment and management of vascular access device: Secondary | ICD-10-CM | POA: Diagnosis not present

## 2016-06-21 DIAGNOSIS — Z85048 Personal history of other malignant neoplasm of rectum, rectosigmoid junction, and anus: Secondary | ICD-10-CM

## 2016-06-21 LAB — COMPREHENSIVE METABOLIC PANEL
ALK PHOS: 80 U/L (ref 38–126)
ALT: 32 U/L (ref 17–63)
AST: 35 U/L (ref 15–41)
Albumin: 4.5 g/dL (ref 3.5–5.0)
Anion gap: 6 (ref 5–15)
BUN: 14 mg/dL (ref 6–20)
CALCIUM: 9.1 mg/dL (ref 8.9–10.3)
CO2: 24 mmol/L (ref 22–32)
CREATININE: 0.92 mg/dL (ref 0.61–1.24)
Chloride: 106 mmol/L (ref 101–111)
GFR calc non Af Amer: >60 mL/min (ref >60)
Glucose, Bld: 110 mg/dL — ABNORMAL HIGH (ref 65–99)
Potassium: 3.6 mmol/L (ref 3.5–5.1)
SODIUM: 136 mmol/L (ref 135–145)
Total Bilirubin: 0.5 mg/dL (ref 0.3–1.2)
Total Protein: 8.2 g/dL — ABNORMAL HIGH (ref 6.5–8.1)

## 2016-06-21 LAB — CBC WITH DIFFERENTIAL/PLATELET
Basophils Absolute: 0 10*3/uL (ref 0.0–0.1)
Basophils Relative: 0 %
EOS ABS: 0.3 10*3/uL (ref 0.0–0.7)
Eosinophils Relative: 8 %
HCT: 38.9 % — ABNORMAL LOW (ref 39.0–52.0)
HEMOGLOBIN: 12.8 g/dL — AB (ref 13.0–17.0)
LYMPHS ABS: 0.6 10*3/uL — AB (ref 0.7–4.0)
LYMPHS PCT: 18 %
MCH: 29.5 pg (ref 26.0–34.0)
MCHC: 32.9 g/dL (ref 30.0–36.0)
MCV: 89.6 fL (ref 78.0–100.0)
Monocytes Absolute: 0.4 10*3/uL (ref 0.1–1.0)
Monocytes Relative: 11 %
NEUTROS PCT: 63 %
Neutro Abs: 2.3 10*3/uL (ref 1.7–7.7)
Platelets: 154 10*3/uL (ref 150–400)
RBC: 4.34 MIL/uL (ref 4.22–5.81)
RDW: 12.3 % (ref 11.5–15.5)
WBC: 3.6 10*3/uL — AB (ref 4.0–10.5)

## 2016-06-21 MED ORDER — SODIUM CHLORIDE 0.9% FLUSH
10.0000 mL | INTRAVENOUS | Status: DC | PRN
  Administered 2016-06-21: 10 mL via INTRAVENOUS
  Filled 2016-06-21: qty 10

## 2016-06-21 MED ORDER — HEPARIN SOD (PORK) LOCK FLUSH 100 UNIT/ML IV SOLN
INTRAVENOUS | Status: AC
  Filled 2016-06-21: qty 5

## 2016-06-21 MED ORDER — HEPARIN SOD (PORK) LOCK FLUSH 100 UNIT/ML IV SOLN
500.0000 [IU] | Freq: Once | INTRAVENOUS | Status: AC
  Administered 2016-06-21: 500 [IU] via INTRAVENOUS

## 2016-06-21 NOTE — Progress Notes (Signed)
[name] presented for Portacath access and flush. Portacath located left chest wall accessed with  H 20 needle. Good blood return present. Portacath flushed with 34ml NS and 500U/21ml Heparin and needle removed intact. Procedure without incident. Patient tolerated procedure well.

## 2016-06-21 NOTE — Assessment & Plan Note (Addendum)
Stage IIIA (T1N1AM0) adenocarcinoma of rectum, S/P definitive surgery on 07/06/2015 with segmental resection of rectosigmoid cancer followed by FOLFOX x 6 cycles (08/15/2015- 11/16/2015) with transition to 5FU CI with XRT (12/05/2015- 01/16/2016) and then back to FOLFOX (01/30/2016- 02/13/2016) to complete 6 months worth of adjuvant systemic chemotherapy.  Pre-op CEA was NOT elevated.  Oncology history is updated.  Labs today: CBC diff, CMET, CEA.  I personally reviewed and went over laboratory results with the patient.  The results are noted within this dictation.  No role for labs today.  I personally reviewed and went over radiographic studies with the patient.  The results are noted within this dictation.  CT imaging of abd/pelvis was negative for any evidence of disease or recurrence.  He will be due for repeat imaging in 1 year.  Order is placed.  He underwent colonoscopy by Dr. Rourk on 05/21/2016.  Poor prep was noted and therefore, procedure was aborted.  He will be rescheduled for the procedure after follow-up with Eric Gill, NP later this month.  He has met with CSW and survivorship clinic with Gretchen Dawson, NP.  Both of these interventions have been not only well received by the patient, but also very beneficial from multiple facets.  He knows to reach out to both services on an as needed basis.  Labs in 8 weeks: CBC diff, CMET, CEA.  Pre-operative CEA was not elevated and therefore utility of monitoring CEA moving forward is questionable.  I have refilled his month supply of pain medications the other day (tramadol and hydrocodone).  Return in 8 weeks for follow-up.  Per NCCN guidelines, we are to see the patient every 3-6 months, HOWEVER, I think it is reasonable to see the patient a little more often during this time given his blood counts, his difficulties through therapy, and also for emotional and spiritual support.  In the not too distant future, based upon how the patient is doing,  will space out appointments accordingly.  

## 2016-06-21 NOTE — Progress Notes (Signed)
[name] presented for Portacath access and flush.  Portacath located left chest wall accessed with  H 20 needle.  Good blood return present. Portacath flushed with 17ml NS and 500U/21ml Heparin and needle removed intact.  Procedure tolerated well and without incident.

## 2016-06-21 NOTE — Progress Notes (Signed)
[facility], [name] [address] [place] [address]  Rectal cancer [facility]) - Plan: CT Abdomen Pelvis W Contrast, CBC with Differential, Comprehensive metabolic panel, CEA  CURRENT THERAPY: Surveillance per NCCN guidelines  INTERVAL HISTORY: [name] 55 y.o. male returns for followup of Stage IIIA (T1N1AM0) adenocarcinoma of rectum, S/P definitive surgery on 07/06/2015 with segmental resection of rectosigmoid cancer followed by FOLFOX x 6 cycles (08/15/2015- 11/16/2015) with transition to 5FU CI with XRT (12/05/2015- 01/16/2016) and then back to FOLFOX (01/30/2016- 02/13/2016) to complete 6 months worth of adjuvant systemic chemotherapy.  Pre-op CEA was NOT elevated.    Rectal cancer ([name])   03/31/2015 Initial Biopsy    Rectum, biopsy, rectal mass - INVASIVE ADENOCARCINOMA     06/02/2015 Tumor Marker    Results for [name] (MRN [id number]) as of 08/28/2015 09:09  06/02/2015 16:00 CEA: 4.3      06/08/2015 Imaging    CT abd/pelvis-Rectal primary, without evidence of metastatic disease or acute complication.     06/24/2015 Imaging    CT chest- No specific features identified to suggest metastatic disease to the chest.     07/06/2015 Definitive Surgery    Robotic-assisted lower anterior resrction, rigid proctoscopy [name])- INVASIVE ADENOCARCINOMA, 2 cm, TUMOR INVADES INTO SUBMUCOSA. LVI/PNI (+).  1/14 LN (+). Margins neg.      07/06/2015 Miscellaneous    IHC Tumor Expression Results normal.  Negative for MLH1, MSH2, MSH6, & PMS2.      07/06/2015 Pathologic Stage    pT1, pN1a, pMx     08/15/2015 - 11/16/2015 Chemotherapy    FOLFOX x 6 cycles (adjuvant chemo "phase 1")     09/26/2015 Treatment Plan Change    Treatment held today.  5FU bolus D/C'd.  Neulasta OnPro added to each subsequent cycles of therapy.     10/04/2015 Treatment Plan Change    Neulasta onpro added to all subsequent cycles of treatment.     10/18/2015 Treatment Plan Change    Tx held for  thrombocytopenia.  5FU CI is reduced by 20% for subsequent treatments.     12/05/2015 - 01/16/2016 Chemotherapy    5FU continuous infusion weekly x 6 cycles; concurrent radiation. (adjuvant chemo "phase 2")     12/05/2015 - 01/18/2016 Radiation Therapy    Pelvic radiation completed in [name]).  Pelvis 45 Gy in 25 fractions.  Then cone down boost treatment for additional 5.4 Gy in 8 fractions.  Total dose: 50.4 Gy in 33 treatments     01/30/2016 - 02/13/2016 Chemotherapy    FOLFOX x 2 cycles to complete 6 months of adjuvant chemotherapy.  (adjuvant chemo "phase 3")     05/21/2016 Procedure    Colonoscopy by Dr. Gala [name]- The procedure was aborted due to inadequate bowel prep. Patient readily admits to eating at least applesauce yesterday which was in conflict with our written and verbal preparation instructions. No specimens collected.     06/19/2016 Imaging    CT abd/pelvis- Postsurgical changes of the rectum. New presacral soft tissue thickening likely post treatment related. Recommend attention on followup. Large amount of stool in the colon compatible with constipation. No evidence for distant metastatic dz.      He is doing very well without any complaints today.  Review of Systems  Constitutional: Negative.  Negative for chills, fever, malaise/fatigue and weight loss.  HENT: Negative.   Eyes: Negative.   Respiratory: Negative.  Negative for cough and shortness of breath.  Cardiovascular: Negative.  Negative for chest pain.  Gastrointestinal: Negative.  Negative for abdominal pain, blood in stool, constipation, diarrhea, melena, nausea and vomiting.  Genitourinary: Negative.   Musculoskeletal: Negative.   Skin: Negative.   Neurological: Negative.  Negative for weakness.  Endo/Heme/Allergies: Negative.   Psychiatric/Behavioral: Negative.     Past Medical History:  Diagnosis Date  . Arthritis   . Cancer ([place])    rectal  . Depression   . Diabetes mellitus    "Borderline"  . GERD  (gastroesophageal reflux disease)    No weakness  . High cholesterol   . Hypertension   . Neuropathy ([place])    "back missed up"  . Rectal cancer ([place])   . Sleep apnea     Past Surgical History:  Procedure Laterality Date  . BACK SURGERY    . BIOPSY  03/31/2015   Procedure: BIOPSY;  Surgeon: Douglas Dolin, [name];  Location: [facility] ORS;  Service: [name];;  . CHOLECYSTECTOMY    . COLONOSCOPY WITH PROPOFOL N/A 03/31/2015   Procedure: COLONOSCOPY WITH PROPOFOL at cecum 0842; withdrawal time=12mnutes;  Surgeon: [name];  Location: [facility] ORS;  Service: [name];  Laterality: N/A;  . COLONOSCOPY WITH PROPOFOL N/A 05/21/2016   Procedure: COLONOSCOPY WITH PROPOFOL;  Surgeon: [name];  Location: [facility] SUITE;  Service: [name];  Laterality: N/A;  200 - moved to 12:45 - office calling pt with 11:15 arrival time  . FLEXIBLE SIGMOIDOSCOPY N/A 07/05/2015   Procedure: FLEXIBLE SIGMOIDOSCOPY;  Surgeon: [name];  Location: [name];  Service: [name];  Laterality: N/A;  with tattoo  . POLYPECTOMY  03/31/2015   Procedure: POLYPECTOMY;  Surgeon: [name];  Location: [facility];  Service: [name];;  . PORTACATH PLACEMENT N/A 08/09/2015   Procedure: INSERTION PORT-A-CATH LEFT SUBCLAVIAN;  Surgeon: [name];  Location: [name]  Service: General;  Laterality: N/A;  . XI ROBOTIC ASSISTED LOWER ANTERIOR RESECTION N/A 07/06/2015   Procedure: XI ROBOTIC ASSISTED LOWER ANTERIOR RESECTION, rigid proctoscopy;  Surgeon: [name];  Location: [facility];  Service: General;  Laterality: N/A;    Family History  Problem Relation Age of Onset  . Hypertension      "Not sure who, I don't know much about my family"  . Diabetes      "Not sure who, I don't know much about my family"  . Colon cancer Neg Hx     "Not that I know of"  . Colon polyps Neg Hx     "not that I know of"    Social History   Social History  . Marital status: Legally Separated    Spouse name:  N/A  . Number of children: N/A  . Years of education: N/A   Social History Main Topics  . Smoking status: Former Smoker    Packs/day: 0.25    Years: 2.00    Types: Cigarettes    Quit date: 03/23/2006  . Smokeless tobacco: Never Used  . Alcohol use No  . Drug use: No  . Sexual activity: Not Currently   Other Topics Concern  . None   Social History Narrative  . None     PHYSICAL EXAMINATION  ECOG PERFORMANCE STATUS: 1 - Symptomatic but completely ambulatory  Vitals:   06/21/16 1206  BP: 124/83  Pulse: 96  Resp: 16  Temp: 98 F (36.7 C)    GENERAL:alert, no distress, well nourished, well developed, comfortable, cooperative, obese, smiling and unaccompanied today. SKIN: skin  color, texture, turgor are normal, no rashes or significant lesions HEAD: Normocephalic, No masses, lesions, tenderness or abnormalities EYES: normal, EOMI, Conjunctiva are pink and non-injected EARS: External ears normal OROPHARYNX:lips, buccal mucosa, and tongue normal and mucous membranes are moist  NECK: supple, no adenopathy, trachea midline LYMPH:  not examined BREAST:not examined LUNGS: clear to auscultation  HEART: regular rate & rhythm ABDOMEN:abdomen soft, non-tender, obese and normal bowel sounds BACK: Back symmetric, no curvature. EXTREMITIES:less then 2 second capillary refill, no joint deformities, effusion, or inflammation, no skin discoloration, no cyanosis  NEURO: alert & oriented x 3 with fluent speech, no focal motor/sensory deficits, gait normal with use of cane   LABORATORY DATA: CBC    Component Value Date/Time   WBC 3.6 (L) 05/14/2016 1105   RBC 4.35 05/14/2016 1105   HGB 13.2 05/14/2016 1105   HCT 39.4 05/14/2016 1105   PLT 138 (L) 05/14/2016 1105   MCV 90.6 05/14/2016 1105   MCH 30.3 05/14/2016 1105   MCHC 33.5 05/14/2016 1105   RDW 11.8 05/14/2016 1105   LYMPHSABS 0.7 05/14/2016 1105   MONOABS 0.4 05/14/2016 1105   EOSABS 0.5 05/14/2016 1105   BASOSABS 0.0  05/14/2016 1105      Chemistry      Component Value Date/Time   NA 136 05/14/2016 1105   K 4.1 05/14/2016 1105   CL 104 05/14/2016 1105   CO2 24 05/14/2016 1105   BUN 15 05/14/2016 1105   CREATININE 0.89 05/14/2016 1105      Component Value Date/Time   CALCIUM 9.3 05/14/2016 1105   ALKPHOS 76 04/10/2016 1127   AST 34 04/10/2016 1127   ALT 32 04/10/2016 1127   BILITOT 0.7 04/10/2016 1127     Lab Results  Component Value Date   CEA 4.0 04/10/2016      PENDING LABS:   RADIOGRAPHIC STUDIES:  Ct Abdomen Pelvis W Contrast  Result Date: 06/19/2016 CLINICAL DATA:  Patient with history of stage III rectal carcinoma status post surgery, chemo and radiation. EXAM: CT ABDOMEN AND PELVIS WITH CONTRAST TECHNIQUE: Multidetector CT imaging of the abdomen and pelvis was performed using the standard protocol following bolus administration of intravenous contrast. CONTRAST:  178m ISOVUE-300 IOPAMIDOL (ISOVUE-300) INJECTION 61% COMPARISON:  CT abdomen pelvis 06/08/2015. FINDINGS: Lower chest: Normal heart size. No consolidative pulmonary opacities. No pleural effusion. Hepatobiliary: Liver is normal in size and contour. No focal lesion identified. Patient status post cholecystectomy. No intrahepatic or extrahepatic biliary ductal dilatation. Pancreas: Unremarkable. Spleen: Unremarkable. Adrenals/Urinary Tract: The adrenal glands are normal. Kidneys enhance symmetrically with contrast. Stable bilateral renal hypodensities, too small to characterize. Urinary bladder is unremarkable. Stomach/Bowel: Postsurgical changes involving the rectum. There is presacral soft tissue thickening which is new from prior and likely posttreatment related. Large amount of stool within the colon. No evidence for bowel obstruction. Normal morphology of the stomach. No free intraperitoneal air. Vascular/Lymphatic: Normal caliber abdominal aorta. No retroperitoneal lymphadenopathy. Other: Normal caliber abdominal aorta. No  retroperitoneal lymphadenopathy. Musculoskeletal: Lower thoracic lumbar spine degenerative changes. No aggressive or acute appearing osseous lesions. Marked degenerative changes of the left-greater-than-right hip joints. IMPRESSION: Postsurgical changes involving the rectum. New presacral soft tissue thickening likely post treatment related. Recommend attention on followup. Large amount of stool in the colon compatible with constipation. No evidence for distant metastatic disease. Electronically Signed   By: DLovey NewcomerM.D.   On: 06/19/2016 12:09     PATHOLOGY:    ASSESSMENT AND PLAN:  Rectal cancer ([place] Stage IIIA (  [id number]) adenocarcinoma of rectum, S/P definitive surgery on 07/06/2015 with segmental resection of rectosigmoid cancer followed by FOLFOX x 6 cycles (08/15/2015- 11/16/2015) with transition to 5FU CI with XRT (12/05/2015- 01/16/2016) and then back to FOLFOX (01/30/2016- 02/13/2016) to complete 6 months worth of adjuvant systemic chemotherapy.  Pre-op CEA was NOT elevated.  Oncology history is updated.  Labs today: CBC diff, CMET, CEA.  I personally reviewed and went over laboratory results with the patient.  The results are noted within this dictation.  No role for labs today.  I personally reviewed and went over radiographic studies with the patient.  The results are noted within this dictation.  CT imaging of abd/pelvis was negative for any evidence of disease or recurrence.  He will be due for repeat imaging in 1 year.  Order is placed.  He underwent colonoscopy by Dr. Gala [name] on 05/21/2016.  Poor prep was noted and therefore, procedure was aborted.  He will be rescheduled for the procedure after follow-up with Walden Field, NP later this month.  He has met with CSW and survivorship clinic with Mike Craze, NP.  Both of these interventions have been not only well received by the patient, but also very beneficial from multiple facets.  He knows to reach out to both services on an as needed  basis.  Labs in 8 weeks: CBC diff, CMET, CEA.  Pre-operative CEA was not elevated and therefore utility of monitoring CEA moving forward is questionable.  I have refilled his month supply of pain medications the other day (tramadol and hydrocodone).  Return in 8 weeks for follow-up.  Per NCCN guidelines, we are to see the patient every 3-6 months, HOWEVER, I think it is reasonable to see the patient a little more often during this time given his blood counts, his difficulties through therapy, and also for emotional and spiritual support.  In the not too distant future, based upon how the patient is doing, will space out appointments accordingly.    ORDERS PLACED FOR THIS ENCOUNTER: Orders Placed This Encounter  Procedures  . CT Abdomen Pelvis W Contrast  . CBC with Differential  . Comprehensive metabolic panel  . CEA    MEDICATIONS PRESCRIBED THIS ENCOUNTER: No orders of the defined types were placed in this encounter.   THERAPY PLAN:  NCCN guidelines regarding surveillance for rectal cancer are as follows (3. 2017):  1. Stage I with full surgical staging:   A. Colonoscopy 1 year    1. If advanced adenoma, repeat in 1 year.    2. If no advanced adenoma, repeat in 3 years, then every 5 years  2. Stage II, III:   A. History and physical every 3-6 months for 2 years, then every 6 months for a total of 5 years.   B. CEA every 3-6 months for 2 years, then every 6 months for a total of 5 years.   C. Chest/abdominal/pelvic CT every 6-12 months (category 2b for frequency less than 12 months) for a total of 5 years.   D. Colonoscopy in 1 year except if no preoperative colonoscopy due to obstructing lesion, colonoscopy in 3-6 months.    1. If advanced adenoma, repeat in 1 year    2. If no advanced adenoma, repeat in 3 years, then every 5 years.   E. Proctoscopy (with EUS or MRI with contrast) every 3-6 months for the first 2 years, then every 6 months for total of 5 years (for patients  treated with transanal excision only).  F. PET/CT scan is not recommended.  3. Stage IV:   A. History and physical every 3-6 months for 2 years, then every 6 months for a total of 5 years.   B. CEA every 3-6 months for 2 years, then every 6 months for a total of 5 years.   C. Chest/abdominal/pelvic CT every 3-6 months (category 2b for frequency less than 6 months) for 2 years, then every 6-12 months for a total of 5 years.   D. Colonoscopy in 1 year except if no preoperative colonoscopy due to obstructing lesion, colonoscopy in 3-6 months.    1. If advanced adenoma, repeat in 1 year    2. If no advanced adenoma, repeat in 3 years, then every 5 years.   E. Proctoscopy (with EUS or MRI with contrast) every 3-6 months for the first 2 years, then every 6 months for total of 5 years (for patients treated with transanal excision only).   F. PET/CT scan is not recommended.         All questions were answered. The patient knows to call the clinic with any problems, questions or concerns. We can certainly see the patient much sooner if necessary.  Patient and plan discussed with Dr. Ancil Linsey and she is in agreement with the aforementioned.   This note is electronically signed by: Doy Mince 06/21/2016 12:43 PM

## 2016-06-21 NOTE — Patient Instructions (Signed)
[place] at [facility] Discharge Instructions  RECOMMENDATIONS MADE BY THE CONSULTANT AND ANY TEST RESULTS WILL BE SENT TO YOUR REFERRING PHYSICIAN.  Exam done and seen today by Marisue Ivan flush with labs today. Port Flush every 8 weeks. Labs every 8 weeks Return to see the Doctor in 8 weeks Follow up with Dr.Rourk as directed  Call the clinic for any concerns or questions.  Thank you for choosing [place] at [facility] to provide your oncology and hematology care.  To afford each patient quality time with our provider, please arrive at least 15 minutes before your scheduled appointment time.   Beginning January 23rd 2017 lab work for the [facility] will be done in the  Main lab at [facility] on 1st floor. If you have a lab appointment with the [facility] please come in thru the  Main Entrance and check in at the main information desk  You need to re-schedule your appointment should you arrive 10 or more minutes late.  We strive to give you quality time with our providers, and arriving late affects you and other patients whose appointments are after yours.  Also, if you no show three or more times for appointments you may be dismissed from the clinic at the providers discretion.     Again, thank you for choosing [facility].  Our hope is that these requests will decrease the amount of time that you wait before being seen by our physicians.       _____________________________________________________________  Should you have questions after your visit to [facility], please contact our office at [phone] between the hours of 8:30 a.m. and 4:30 p.m.  Voicemails left after 4:30 p.m. will not be returned until the following business day.  For prescription refill requests, have your pharmacy contact our office.         Resources For Cancer Patients and their Caregivers ? American Cancer  Society: Can assist with transportation, wigs, general needs, runs Look Good Feel Better.        805-150-9018 ? Cancer Care: Provides financial assistance, online support groups, medication/co-pay assistance.  1-800-813-HOPE 432-472-8855) ? [place] Assists [place] cancer patients and their families through emotional , educational and financial support.  (215) 314-0903 ? [place] DSS Where to apply for food stamps, Medicaid and utility assistance. 801-543-1117 ? RCATS: Transportation to medical appointments. 845-321-8923 ? Social Security Administration: May apply for disability if have a Stage IV cancer. 209-850-2224 510-099-9936 ? [employer], Disability and Transit Services: Assists with nutrition, care and transit needs. [place] Support Programs: @10RELATIVEDAYS @ > Cancer Support Group  2nd Tuesday of the month 1pm-2pm, Journey Room  > Creative Journey  3rd Tuesday of the month 1130am-1pm, Journey Room  > Look Good Feel Better  1st Wednesday of the month 10am-12 noon, Journey Room (Call [place] to register [phone])

## 2016-06-21 NOTE — Patient Instructions (Signed)
[place] at [facility] Discharge Instructions  RECOMMENDATIONS MADE BY THE CONSULTANT AND ANY TEST RESULTS WILL BE SENT TO YOUR REFERRING PHYSICIAN.  Port a cath flush with labs done today. Follow up as scheduled.   Thank you for choosing [place] at [facility] to provide your oncology and hematology care.  To afford each patient quality time with our provider, please arrive at least 15 minutes before your scheduled appointment time.   Beginning January 23rd 2017 lab work for the [facility] will be done in the  Main lab at [facility] on 1st floor. If you have a lab appointment with the [place] please come in thru the  Main Entrance and check in at the main information desk  You need to re-schedule your appointment should you arrive 10 or more minutes late.  We strive to give you quality time with our providers, and arriving late affects you and other patients whose appointments are after yours.  Also, if you no show three or more times for appointments you may be dismissed from the clinic at the providers discretion.     Again, thank you for choosing [facility].  Our hope is that these requests will decrease the amount of time that you wait before being seen by our physicians.       _____________________________________________________________  Should you have questions after your visit to [facility], please contact our office at [phone] between the hours of 8:30 a.m. and 4:30 p.m.  Voicemails left after 4:30 p.m. will not be returned until the following business day.  For prescription refill requests, have your pharmacy contact our office.         Resources For Cancer Patients and their Caregivers ? American Cancer Society: Can assist with transportation, wigs, general needs, runs Look Good Feel Better.        915 661 5018 ? Cancer Care: Provides financial assistance, online support  groups, medication/co-pay assistance.  1-800-813-HOPE 617-100-0456) ? [place] Assists [place] cancer patients and their families through emotional , educational and financial support.  (270)128-5949 ? [place] DSS Where to apply for food stamps, Medicaid and utility assistance. 906 791 1994 ? RCATS: Transportation to medical appointments. 847-582-4507 ? Social Security Administration: May apply for disability if have a Stage IV cancer. (954) 598-9620 725-602-5369 ? [employer], Disability and Transit Services: Assists with nutrition, care and transit needs. [place] Support Programs: @10RELATIVEDAYS @ > Cancer Support Group  2nd Tuesday of the month 1pm-2pm, Journey Room  > Creative Journey  3rd Tuesday of the month 1130am-1pm, Journey Room  > Look Good Feel Better  1st Wednesday of the month 10am-12 noon, Journey Room (Call [place] to register [phone])

## 2016-06-22 LAB — CEA: CEA: 4.6 ng/mL (ref 0.0–4.7)

## 2016-06-27 ENCOUNTER — Other Ambulatory Visit: Payer: Self-pay

## 2016-06-27 ENCOUNTER — Ambulatory Visit (INDEPENDENT_AMBULATORY_CARE_PROVIDER_SITE_OTHER): Payer: Commercial Managed Care - HMO | Admitting: Nurse Practitioner

## 2016-06-27 ENCOUNTER — Encounter: Payer: Self-pay | Admitting: Nurse Practitioner

## 2016-06-27 ENCOUNTER — Other Ambulatory Visit: Payer: Self-pay | Admitting: Licensed Clinical Social Worker

## 2016-06-27 VITALS — BP 135/91 | HR 77 | Temp 97.3°F | Ht 63.0 in | Wt 198.8 lb

## 2016-06-27 DIAGNOSIS — Z85048 Personal history of other malignant neoplasm of rectum, rectosigmoid junction, and anus: Secondary | ICD-10-CM

## 2016-06-27 MED ORDER — PEG 3350-KCL-NA BICARB-NACL 420 G PO SOLR: 4000.0000 mL | ORAL | 0 refills | Status: DC

## 2016-06-27 NOTE — Assessment & Plan Note (Addendum)
Agent with a history of rectal cancer with previous colonoscopy needing to be aborted due to poor prep and not following prep directions. Had a lengthy discussion with him about the importance of following his prep, given that hehad colon cancer before how important these colonoscopies are to detect any possible polyps that could be recurrence of cancer. He verbalized understanding. Given his history of poor preps we will provide a significantly extended prep. We will give him Linzess 145 g once a day for 7 days prior to his prep. We will provide for 2 days of clear liquids prior to his procedure. We will provide for a half dose prep 2 days before his procedure and a full prep the day before his procedure. He is again instructed to follow directions carefully to the letter. Return for follow-up based on postprocedure recommendations.  Proceed with TCS with Dr. Gala Romney on propofol/MAC in near future: the risks, benefits, and alternatives have been discussed with the patient in detail. The patient states understanding and desires to proceed.  The patient is currently on Neurontin, Norco, Zanaflex, Ultram. Previous procedures completed under propofol. We will schedule the procedure and the OR on propofol/MAC to promote adequate sedation.

## 2016-06-27 NOTE — Progress Notes (Signed)
Referring Provider: Rosita Fire, MD Primary Care Physician:  Rosita Fire, MD Primary GI:  Dr. Gala Romney  Chief Complaint  Patient presents with  . Colonoscopy    needs repeat    HPI:   [name] is a 55 y.o. male who presents to reschedule colonoscopy. He was last seen in our office 05/09/2016 for history of rectal cancer in 2016 status post surgical resection, radiation, and chemotherapy. Was found to be doing quite well at his last visit in one year since his most recent colonoscopy currently due for surveillance. His last colonoscopy noted poor prep and difficulty maintaining insufflation due to persistent coughing. He was arranged for repeat colonoscopy with extended prep and propofol/MAC. Colonoscopy was attempted on 05/21/2016 but aborted due to poor prep and the patient admitted to eating at least applesauce the day before and conflict with written and verbal instructions. Recommend repeat colonoscopy in 1 months for surveillance.  Today he states he's doing well overall. Admits eating applesauce during his prep. Emphasized the importance of a good prep and following directions closely. Denies abdominal pain, N/V, hematochezia, melena, unintentional weight loss, fever, chills. Denies chest pain, dyspnea, dizziness, lightheadedness, syncope, near syncope. Denies any other upper or lower GI symptoms.  Past Medical History:  Diagnosis Date  . Arthritis   . Cancer ([place])    rectal  . Depression   . Diabetes mellitus    "Borderline"  . GERD (gastroesophageal reflux disease)    No weakness  . High cholesterol   . Hypertension   . Neuropathy ([place])    "back missed up"  . Rectal cancer ([place])   . Sleep apnea     Past Surgical History:  Procedure Laterality Date  . BACK SURGERY    . BIOPSY  03/31/2015   Procedure: BIOPSY;  Surgeon: Daneil Dolin, MD;  Location: [facility];  Service: Endoscopy;;  . CHOLECYSTECTOMY    . COLONOSCOPY WITH PROPOFOL N/A 03/31/2015   Procedure:  COLONOSCOPY WITH PROPOFOL at cecum 0842; withdrawal time=55minutes;  Surgeon: Daneil Dolin, MD;  Location: [facility];  Service: Endoscopy;  Laterality: N/A;  . COLONOSCOPY WITH PROPOFOL N/A 05/21/2016   Procedure: COLONOSCOPY WITH PROPOFOL;  Surgeon: Daneil Dolin, MD;  Location: [facility];  Service: Endoscopy;  Laterality: N/A;  200 - moved to 12:45 - office calling pt with 11:15 arrival time  . FLEXIBLE SIGMOIDOSCOPY N/A 07/05/2015   Procedure: FLEXIBLE SIGMOIDOSCOPY;  Surgeon: [name], MD;  Location: [facility];  Service: Endoscopy;  Laterality: N/A;  with tattoo  . POLYPECTOMY  03/31/2015   Procedure: POLYPECTOMY;  Surgeon: Daneil Dolin, MD;  Location: [facility];  Service: Endoscopy;;  . PORTACATH PLACEMENT N/A 08/09/2015   Procedure: INSERTION PORT-A-CATH LEFT SUBCLAVIAN;  Surgeon: [name], MD;  Location: [facility];  Service: General;  Laterality: N/A;  . XI ROBOTIC ASSISTED LOWER ANTERIOR RESECTION N/A 07/06/2015   Procedure: XI ROBOTIC ASSISTED LOWER ANTERIOR RESECTION, rigid proctoscopy;  Surgeon: [name], MD;  Location: [facility];  Service: General;  Laterality: N/A;    Current Outpatient Prescriptions  Medication Sig Dispense Refill  . amLODipine (NORVASC) 5 MG tablet Take 1 tablet (5 mg total) by mouth every morning. 30 tablet 2  . gabapentin (NEURONTIN) 800 MG tablet Take 1 tablet (800 mg total) by mouth 3 (three) times daily. 90 tablet 2  . HYDROcodone-acetaminophen (NORCO) 7.5-325 MG tablet Take 1 tablet by mouth every 12 (twelve) hours as needed for moderate pain. 45 tablet 0  .  lisinopril (PRINIVIL,ZESTRIL) 40 MG tablet Take 1 tablet (40 mg total) by mouth every morning. 30 tablet 2  . NON FORMULARY PT HAS A C-PAP MACHINE    . nortriptyline (PAMELOR) 75 MG capsule Take 1 capsule (75 mg total) by mouth at bedtime. 30 capsule 2  . tiZANidine (ZANAFLEX) 2 MG tablet Take 1 tablet (2 mg total) by mouth every 8 (eight) hours as needed for muscle spasms. 30 tablet 2  .  traMADol (ULTRAM) 50 MG tablet May take 1-2 tabs every 6 hours as needed for pain 120 tablet 0   No current facility-administered medications for this visit.     Allergies as of 06/27/2016  . (No Known Allergies)    Family History  Problem Relation Age of Onset  . Hypertension      "Not sure who, I don't know much about my family"  . Diabetes      "Not sure who, I don't know much about my family"  . Colon cancer Neg Hx     "Not that I know of"  . Colon polyps Neg Hx     "not that I know of"    Social History   Social History  . Marital status: Legally Separated    Spouse name: N/A  . Number of children: N/A  . Years of education: N/A   Social History Main Topics  . Smoking status: Former Smoker    Packs/day: 0.25    Years: 2.00    Types: Cigarettes    Quit date: 03/23/2006  . Smokeless tobacco: Never Used  . Alcohol use No  . Drug use: No  . Sexual activity: Not Currently   Other Topics Concern  . None   Social History Narrative  . None    Review of Systems: General: Negative for anorexia, weight loss, fever, chills, fatigue, weakness. ENT: Negative for hoarseness, difficulty swallowing. CV: Negative for chest pain, angina, palpitations, peripheral edema.  Respiratory: Negative for dyspnea at rest, cough, sputum, wheezing.  GI: See history of present illness. Endo: Negative for unusual weight change.  Heme: Negative for bruising or bleeding.   Physical Exam: BP (!) 135/91   Pulse 77   Temp 97.3 F (36.3 C) (Oral)   Ht 5\' 3"  (1.6 m)   Wt 198 lb 12.8 oz (90.2 kg)   BMI 35.22 kg/m  General:   Alert and oriented. Pleasant and cooperative. Well-nourished and well-developed.  Ears:  Normal auditory acuity. Cardiovascular:  S1, S2 present without murmurs appreciated. Extremities without clubbing or edema. Respiratory:  Clear to auscultation bilaterally. No wheezes, rales, or rhonchi. No distress.  Gastrointestinal:  +BS, rounded but soft, non-tender and  non-distended. No HSM noted. No guarding or rebound. No masses appreciated.  Rectal:  Deferred  Musculoskalatal:  Symmetrical without gross deformities. Neurologic:  Alert and oriented x4;  grossly normal neurologically. Psych:  Alert and cooperative. Normal mood and affect. Heme/Lymph/Immune: No excessive bruising noted.    06/27/2016 4:06 PM   Disclaimer: This note was dictated with voice recognition software. Similar sounding words can inadvertently be transcribed and may not be corrected upon review.

## 2016-06-27 NOTE — Patient Instructions (Signed)
1. We will schedule your procedure for you. 2. It is very important you follow your directions exactly as given to you. 3. Take Linzess 145 g once a day for 7 days prior to starting your prep medicine. 4. We will have you take clear liquids for 2 days prior to your procedure. 5. 2 days before your procedure, he will take a half a dose of prep. The day before your procedure you'll take the regular fold dose of prep. 6. If you have any questions about your prep instructions please do not hesitate to call us and clarify. Return for follow-up based on recommendations made after your procedure.

## 2016-06-27 NOTE — Progress Notes (Signed)
cc'ed to pcp °

## 2016-06-27 NOTE — Patient Outreach (Signed)
Assessment:  CSW spoke via phone with client on 06/27/16. CSW verified client identity. CSW and client spoke of client needs. CSW and client spoke of client care plan. CSW encouraged client to collect documents needed to apply for Medicaid. CSW encouraged client to apply for Medicaid in next 30 days. Client has a vehicle he drives to his scheduled medical appointments. Client is seeing Dr. Legrand Rams as his primary care physician.  Client said he had an appointment with Dr. Legrand Rams last month..  Client has prescribed medications and is taking medications as prescribed. Client has some support from his son. Client's son helps client procure needed food items for client. Client said he has Food Stamps benefit and that this benefit is helpful to him. Client said he goes one time a month to local [employer] pantry to seek food support. Client said he goes 2 or 3 times weekly to [facility] to socialize or to participate in scheduled activities. Client said he is trying to attend medical appointments for client as scheduled. Client said he is still collecting documents to help him apply for Medicaid. He said he still has not collected all documents needed for him to apply for Medicaid. CSW thanked client for phone call with CSW on 06/27/16. CSW encouraged client to call CSW at [phone] as needed to discuss social work needs of client.    Plan:  Client to collect documents needed to apply for Medicaid and client to apply for Medicaid in next 30 days.  CSW to call client in 4 weeks to assess client needs.   [name].Kitrina Maurin MSW, LCSW Licensed Clinical Social Worker [facility] Care Management [phone]

## 2016-06-28 ENCOUNTER — Other Ambulatory Visit: Payer: Self-pay

## 2016-07-05 ENCOUNTER — Other Ambulatory Visit: Payer: Self-pay

## 2016-07-05 ENCOUNTER — Encounter: Payer: Self-pay | Admitting: Licensed Clinical Social Worker

## 2016-07-05 ENCOUNTER — Telehealth: Payer: Self-pay

## 2016-07-05 ENCOUNTER — Other Ambulatory Visit: Payer: Self-pay | Admitting: Licensed Clinical Social Worker

## 2016-07-05 DIAGNOSIS — Z85048 Personal history of other malignant neoplasm of rectum, rectosigmoid junction, and anus: Secondary | ICD-10-CM

## 2016-07-05 NOTE — Patient Outreach (Signed)
Assessment:  CSW spoke via phone with client on 07/05/16.  CSW verified client identity. CSW and [name] spoke of client needs.  Client has been receiving [name] CSW support for a number of months. Client is attending his medical appointments with [name], his primary doctor. Client has prescribed medications and is taking medications as prescribed. Client has no transport needs since he drives his Lucianne Lei to and from his scheduled medical appointments. Client has support from his son who helps client obtain needed food items for client. CSW has talked with client multiple times about Medicaid application process and documents client needs to collect as part of a Medicaid application.  Care plan for client related to client's collecting needed documents to apply for Medicaid and client's applying for Medicaid. Client has not met this care plan goal though it has been discussed with client multiple times. Thus, CSW informed client on 07/05/16 that [name] would discharge client from [facility] services on 07/05/16 since client did not meet care plan goal for a number of months. Client agreed to this plan and client agreed for CSW to discharge client on 07/05/16 from [employer] services. CSW thanked client for communicating with CSW in recent months regarding client needs. CSW encouraged client to continue to attend scheduled client medical appointments with [name], primary doctor for client.  Client was appreciative of [name] CSW support in recent months    Plan:  CSW is discharging [name] from [facility] CSW services on 07/05/16 since client has not been able to meet client care plan goal for a number of months.  CSW to inform [name] that [name] discharged client on 07/05/16 from [facility] services.  CSW to fax physician case closure letter to [name] informing [name] that [name] discharged client on 07/05/16 from [name] CSW services.  [name].[name] MSW, LCSW Licensed Clinical Social Worker [facility] Care  Management [phone]

## 2016-07-05 NOTE — Telephone Encounter (Signed)
Pt called to move his TCS from 07/17/16 to 07/26/16. I am going to redo his instructions and he will come by to pick them up.

## 2016-07-12 DIAGNOSIS — G4733 Obstructive sleep apnea (adult) (pediatric): Secondary | ICD-10-CM | POA: Diagnosis not present

## 2016-07-19 ENCOUNTER — Other Ambulatory Visit (HOSPITAL_COMMUNITY): Payer: Self-pay | Admitting: Oncology

## 2016-07-19 DIAGNOSIS — C20 Malignant neoplasm of rectum: Secondary | ICD-10-CM

## 2016-07-19 MED ORDER — HYDROCODONE-ACETAMINOPHEN 7.5-325 MG PO TABS: 1.0000 | ORAL_TABLET | Freq: Two times a day (BID) | ORAL | 0 refills | Status: DC | PRN

## 2016-07-19 MED ORDER — TRAMADOL HCL 50 MG PO TABS: ORAL_TABLET | ORAL | 0 refills | Status: DC

## 2016-07-20 ENCOUNTER — Other Ambulatory Visit (HOSPITAL_COMMUNITY): Payer: Self-pay | Admitting: *Deleted

## 2016-07-20 DIAGNOSIS — C20 Malignant neoplasm of rectum: Secondary | ICD-10-CM

## 2016-07-20 MED ORDER — TRAMADOL HCL 50 MG PO TABS: ORAL_TABLET | ORAL | 0 refills | Status: DC

## 2016-07-20 MED ORDER — HYDROCODONE-ACETAMINOPHEN 7.5-325 MG PO TABS: 1.0000 | ORAL_TABLET | Freq: Two times a day (BID) | ORAL | 0 refills | Status: DC | PRN

## 2016-07-24 ENCOUNTER — Other Ambulatory Visit: Payer: Self-pay

## 2016-07-24 ENCOUNTER — Encounter (HOSPITAL_COMMUNITY)
Admission: RE | Admit: 2016-07-24 | Discharge: 2016-07-24 | Disposition: A | Discharge status: Home / Self Care | Payer: Commercial Managed Care - HMO | Source: Ambulatory Visit | Attending: Internal Medicine | Admitting: Internal Medicine

## 2016-07-24 DIAGNOSIS — Z85048 Personal history of other malignant neoplasm of rectum, rectosigmoid junction, and anus: Secondary | ICD-10-CM

## 2016-07-26 ENCOUNTER — Ambulatory Visit (HOSPITAL_COMMUNITY)
Admission: RE | Admit: 2016-07-26 | Discharge: 2016-07-26 | Disposition: A | Discharge status: Home / Self Care | Payer: Commercial Managed Care - HMO | Source: Ambulatory Visit | Attending: Internal Medicine | Admitting: Internal Medicine

## 2016-07-26 ENCOUNTER — Encounter (HOSPITAL_COMMUNITY)
Admission: RE | Disposition: A | Discharge status: Home / Self Care | Payer: Self-pay | Source: Ambulatory Visit | Attending: Internal Medicine

## 2016-07-26 ENCOUNTER — Ambulatory Visit (HOSPITAL_COMMUNITY): Payer: Commercial Managed Care - HMO | Admitting: Anesthesiology

## 2016-07-26 ENCOUNTER — Encounter (HOSPITAL_COMMUNITY): Payer: Self-pay | Admitting: *Deleted

## 2016-07-26 DIAGNOSIS — Z85048 Personal history of other malignant neoplasm of rectum, rectosigmoid junction, and anus: Secondary | ICD-10-CM | POA: Insufficient documentation

## 2016-07-26 DIAGNOSIS — G4730 Sleep apnea, unspecified: Secondary | ICD-10-CM | POA: Insufficient documentation

## 2016-07-26 DIAGNOSIS — Z9049 Acquired absence of other specified parts of digestive tract: Secondary | ICD-10-CM | POA: Insufficient documentation

## 2016-07-26 DIAGNOSIS — Z1211 Encounter for screening for malignant neoplasm of colon: Secondary | ICD-10-CM | POA: Insufficient documentation

## 2016-07-26 DIAGNOSIS — K635 Polyp of colon: Secondary | ICD-10-CM | POA: Diagnosis not present

## 2016-07-26 DIAGNOSIS — Z85038 Personal history of other malignant neoplasm of large intestine: Secondary | ICD-10-CM | POA: Diagnosis not present

## 2016-07-26 DIAGNOSIS — Z87891 Personal history of nicotine dependence: Secondary | ICD-10-CM | POA: Insufficient documentation

## 2016-07-26 DIAGNOSIS — Z79899 Other long term (current) drug therapy: Secondary | ICD-10-CM | POA: Diagnosis not present

## 2016-07-26 DIAGNOSIS — F329 Major depressive disorder, single episode, unspecified: Secondary | ICD-10-CM | POA: Diagnosis not present

## 2016-07-26 DIAGNOSIS — I10 Essential (primary) hypertension: Secondary | ICD-10-CM | POA: Diagnosis not present

## 2016-07-26 DIAGNOSIS — E119 Type 2 diabetes mellitus without complications: Secondary | ICD-10-CM | POA: Insufficient documentation

## 2016-07-26 DIAGNOSIS — K5730 Diverticulosis of large intestine without perforation or abscess without bleeding: Secondary | ICD-10-CM | POA: Diagnosis not present

## 2016-07-26 DIAGNOSIS — D123 Benign neoplasm of transverse colon: Secondary | ICD-10-CM | POA: Diagnosis not present

## 2016-07-26 DIAGNOSIS — Q438 Other specified congenital malformations of intestine: Secondary | ICD-10-CM | POA: Insufficient documentation

## 2016-07-26 DIAGNOSIS — G629 Polyneuropathy, unspecified: Secondary | ICD-10-CM | POA: Diagnosis not present

## 2016-07-26 HISTORY — PX: COLONOSCOPY WITH PROPOFOL: SHX5780

## 2016-07-26 LAB — GLUCOSE, CAPILLARY
GLUCOSE-CAPILLARY: 80 mg/dL (ref 65–99)
GLUCOSE-CAPILLARY: 62 mg/dL — AB (ref 65–99)
GLUCOSE-CAPILLARY: 67 mg/dL (ref 65–99)
Glucose-Capillary: 128 mg/dL — ABNORMAL HIGH (ref 65–99)

## 2016-07-26 SURGERY — COLONOSCOPY
Anesthesia: Moderate Sedation

## 2016-07-26 SURGERY — COLONOSCOPY WITH PROPOFOL
Anesthesia: Monitor Anesthesia Care

## 2016-07-26 MED ORDER — SUCCINYLCHOLINE CHLORIDE 20 MG/ML IJ SOLN
INTRAMUSCULAR | Status: AC
  Filled 2016-07-26: qty 1

## 2016-07-26 MED ORDER — LIDOCAINE HCL (CARDIAC) 10 MG/ML IV SOLN
INTRAVENOUS | Status: DC | PRN
  Administered 2016-07-26: 25 mg via INTRAVENOUS

## 2016-07-26 MED ORDER — MIDAZOLAM HCL 2 MG/2ML IJ SOLN
INTRAMUSCULAR | Status: AC
  Filled 2016-07-26: qty 2

## 2016-07-26 MED ORDER — FENTANYL CITRATE (PF) 100 MCG/2ML IJ SOLN
INTRAMUSCULAR | Status: AC
  Filled 2016-07-26: qty 2

## 2016-07-26 MED ORDER — ARTIFICIAL TEARS OP OINT
TOPICAL_OINTMENT | OPHTHALMIC | Status: AC
  Filled 2016-07-26: qty 7

## 2016-07-26 MED ORDER — PROPOFOL 10 MG/ML IV BOLUS
INTRAVENOUS | Status: AC
  Filled 2016-07-26: qty 20

## 2016-07-26 MED ORDER — DEXTROSE 50 % IV SOLN
25.0000 mL | Freq: Once | INTRAVENOUS | Status: AC
  Administered 2016-07-26: 25 mL via INTRAVENOUS

## 2016-07-26 MED ORDER — PROPOFOL 500 MG/50ML IV EMUL
INTRAVENOUS | Status: DC | PRN
  Administered 2016-07-26 (×2): via INTRAVENOUS
  Administered 2016-07-26: 150 ug/kg/min via INTRAVENOUS

## 2016-07-26 MED ORDER — ONDANSETRON HCL 4 MG/2ML IJ SOLN
INTRAMUSCULAR | Status: AC
  Filled 2016-07-26: qty 2

## 2016-07-26 MED ORDER — LIDOCAINE HCL (PF) 1 % IJ SOLN
INTRAMUSCULAR | Status: AC
  Filled 2016-07-26: qty 5

## 2016-07-26 MED ORDER — ONDANSETRON HCL 4 MG/2ML IJ SOLN
4.0000 mg | Freq: Once | INTRAMUSCULAR | Status: AC
  Administered 2016-07-26: 4 mg via INTRAVENOUS

## 2016-07-26 MED ORDER — DEXTROSE 50 % IV SOLN
INTRAVENOUS | Status: AC
  Filled 2016-07-26: qty 50

## 2016-07-26 MED ORDER — FENTANYL CITRATE (PF) 100 MCG/2ML IJ SOLN
25.0000 ug | INTRAMUSCULAR | Status: DC | PRN
  Administered 2016-07-26: 25 ug via INTRAVENOUS

## 2016-07-26 MED ORDER — LACTATED RINGERS IV SOLN: INTRAVENOUS | Status: DC

## 2016-07-26 MED ORDER — HYDROMORPHONE HCL 1 MG/ML IJ SOLN: 0.2500 mg | INTRAMUSCULAR | Status: DC | PRN

## 2016-07-26 MED ORDER — MIDAZOLAM HCL 5 MG/5ML IJ SOLN
INTRAMUSCULAR | Status: DC | PRN
  Administered 2016-07-26: 2 mg via INTRAVENOUS

## 2016-07-26 MED ORDER — LACTATED RINGERS IV SOLN
INTRAVENOUS | Status: DC | PRN
  Administered 2016-07-26: 11:00:00 via INTRAVENOUS

## 2016-07-26 MED ORDER — MIDAZOLAM HCL 2 MG/2ML IJ SOLN
1.0000 mg | INTRAMUSCULAR | Status: DC | PRN
  Administered 2016-07-26: 2 mg via INTRAVENOUS
  Filled 2016-07-26: qty 2

## 2016-07-26 NOTE — Discharge Instructions (Addendum)
Colonoscopy Discharge Instructions  Read the instructions outlined below and refer to this sheet in the next few weeks. These discharge instructions provide you with general information on caring for yourself after you leave the hospital. Your doctor may also give you specific instructions. While your treatment has been planned according to the most current medical practices available, unavoidable complications occasionally occur. If you have any problems or questions after discharge, call Dr. Gala Romney at [phone]. ACTIVITY  You may resume your regular activity, but move at a slower pace for the next 24 hours.   Take frequent rest periods for the next 24 hours.   Walking will help get rid of the air and reduce the bloated feeling in your belly (abdomen).   No driving for 24 hours (because of the medicine (anesthesia) used during the test).    Do not sign any important legal documents or operate any machinery for 24 hours (because of the anesthesia used during the test).  NUTRITION  Drink plenty of fluids.   You may resume your normal diet as instructed by your doctor.   Begin with a light meal and progress to your normal diet. Heavy or fried foods are harder to digest and may make you feel sick to your stomach (nauseated).   Avoid alcoholic beverages for 24 hours or as instructed.  MEDICATIONS  You may resume your normal medications unless your doctor tells you otherwise.  WHAT YOU CAN EXPECT TODAY  Some feelings of bloating in the abdomen.   Passage of more gas than usual.   Spotting of blood in your stool or on the toilet paper.  IF YOU HAD POLYPS REMOVED DURING THE COLONOSCOPY:  No aspirin products for 7 days or as instructed.   No alcohol for 7 days or as instructed.   Eat a soft diet for the next 24 hours.  FINDING OUT THE RESULTS OF YOUR TEST Not all test results are available during your visit. If your test results are not back during the visit, make an appointment  with your caregiver to find out the results. Do not assume everything is normal if you have not heard from your caregiver or the medical facility. It is important for you to follow up on all of your test results.  SEEK IMMEDIATE MEDICAL ATTENTION IF:  You have more than a spotting of blood in your stool.   Your belly is swollen (abdominal distention).   You are nauseated or vomiting.   You have a temperature over 101.   You have abdominal pain or discomfort that is severe or gets worse throughout the day.     Diverticulosis and colon polyp information provided  You had 1 polyp which was removed but not recovered for the pathologist  Your prep was again poor today.  I recommend you have a repeat colonoscopy in one year.     Diverticulosis Diverticulosis is the condition that develops when small pouches (diverticula) form in the wall of your colon. Your colon, or large intestine, is where water is absorbed and stool is formed. The pouches form when the inside layer of your colon pushes through weak spots in the outer layers of your colon. CAUSES  No one knows exactly what causes diverticulosis. RISK FACTORS  Being older than 51. Your risk for this condition increases with age. Diverticulosis is rare in people younger than 40 years. By age 41, almost everyone has it.  Eating a low-fiber diet.  Being frequently constipated.  Being overweight.  Not  getting enough exercise.  Smoking.  Taking over-the-counter pain medicines, like aspirin and ibuprofen. SYMPTOMS  Most people with diverticulosis do not have symptoms. DIAGNOSIS  Because diverticulosis often has no symptoms, health care providers often discover the condition during an exam for other colon problems. In many cases, a health care provider will diagnose diverticulosis while using a flexible scope to examine the colon (colonoscopy). TREATMENT  If you have never developed an infection related to diverticulosis, you  may not need treatment. If you have had an infection before, treatment may include:  Eating more fruits, vegetables, and grains.  Taking a fiber supplement.  Taking a live bacteria supplement (probiotic).  Taking medicine to relax your colon. HOME CARE INSTRUCTIONS   Drink at least 6-8 glasses of water each day to prevent constipation.  Try not to strain when you have a bowel movement.  Keep all follow-up appointments. If you have had an infection before:  Increase the fiber in your diet as directed by your health care provider or dietitian.  Take a dietary fiber supplement if your health care provider approves.  Only take medicines as directed by your health care provider. SEEK MEDICAL CARE IF:   You have abdominal pain.  You have bloating.  You have cramps.  You have not gone to the bathroom in 3 days. SEEK IMMEDIATE MEDICAL CARE IF:   Your pain gets worse.  Yourbloating becomes very bad.  You have a fever or chills, and your symptoms suddenly get worse.  You begin vomiting.  You have bowel movements that are bloody or black. MAKE SURE YOU:  Understand these instructions.  Will watch your condition.  Will get help right away if you are not doing well or get worse.   This information is not intended to replace advice given to you by your health care provider. Make sure you discuss any questions you have with your health care provider.   Document Released: 08/02/2004 Document Revised: 11/10/2013 Document Reviewed: 09/30/2013 Elsevier Interactive Patient Education 2016 Elsevier Inc.    Colon Polyps Polyps are lumps of extra tissue growing inside the body. Polyps can grow in the large intestine (colon). Most colon polyps are noncancerous (benign). However, some colon polyps can become cancerous over time. Polyps that are larger than a pea may be harmful. To be safe, caregivers remove and test all polyps. CAUSES  Polyps form when mutations in the genes  cause your cells to grow and divide even though no more tissue is needed. RISK FACTORS There are a number of risk factors that can increase your chances of getting colon polyps. They include:  Being older than 50 years.  Family history of colon polyps or colon cancer.  Long-term colon diseases, such as colitis or Crohn disease.  Being overweight.  Smoking.  Being inactive.  Drinking too much alcohol. SYMPTOMS  Most small polyps do not cause symptoms. If symptoms are present, they may include:  Blood in the stool. The stool may look dark red or black.  Constipation or diarrhea that lasts longer than 1 week. DIAGNOSIS People often do not know they have polyps until their caregiver finds them during a regular checkup. Your caregiver can use 4 tests to check for polyps:  Digital rectal exam. The caregiver wears gloves and feels inside the rectum. This test would find polyps only in the rectum.  Barium enema. The caregiver puts a liquid called barium into your rectum before taking X-rays of your colon. Barium makes your colon look white.  Polyps are dark, so they are easy to see in the X-ray pictures.  Sigmoidoscopy. A thin, flexible tube (sigmoidoscope) is placed into your rectum. The sigmoidoscope has a light and tiny camera in it. The caregiver uses the sigmoidoscope to look at the last third of your colon.  Colonoscopy. This test is like sigmoidoscopy, but the caregiver looks at the entire colon. This is the most common method for finding and removing polyps. TREATMENT  Any polyps will be removed during a sigmoidoscopy or colonoscopy. The polyps are then tested for cancer. PREVENTION  To help lower your risk of getting more colon polyps:  Eat plenty of fruits and vegetables. Avoid eating fatty foods.  Do not smoke.  Avoid drinking alcohol.  Exercise every day.  Lose weight if recommended by your caregiver.  Eat plenty of calcium and folate. Foods that are rich in calcium  include milk, cheese, and broccoli. Foods that are rich in folate include chickpeas, kidney beans, and spinach. HOME CARE INSTRUCTIONS Keep all follow-up appointments as directed by your caregiver. You may need periodic exams to check for polyps. SEEK MEDICAL CARE IF: You notice bleeding during a bowel movement.   This information is not intended to replace advice given to you by your health care provider. Make sure you discuss any questions you have with your health care provider.   Document Released: 08/01/2004 Document Revised: 11/26/2014 Document Reviewed: 01/15/2012 Elsevier Interactive Patient Education [employer].

## 2016-07-26 NOTE — Anesthesia Preprocedure Evaluation (Signed)
Anesthesia Evaluation  Patient identified by MRN, date of birth, ID band Patient awake    Reviewed: Allergy & Precautions, NPO status , Patient's Chart, lab work & pertinent test results  Airway Mallampati: III  TM Distance: >3 FB     Dental  (+) Teeth Intact   Pulmonary sleep apnea , former smoker,    breath sounds clear to auscultation       Cardiovascular hypertension, Pt. on medications  Rhythm:Regular Rate:Normal     Neuro/Psych PSYCHIATRIC DISORDERS Depression    GI/Hepatic neg GERD  ,  Endo/Other  diabetes, Type 2  Renal/GU      Musculoskeletal   Abdominal   Peds  Hematology   Anesthesia Other Findings   Reproductive/Obstetrics                             Anesthesia Physical Anesthesia Plan  ASA: III  Anesthesia Plan: MAC   Post-op Pain Management:    Induction: Intravenous  Airway Management Planned: Simple Face Mask  Additional Equipment:   Intra-op Plan:   Post-operative Plan:   Informed Consent: I have reviewed the patients History and Physical, chart, labs and discussed the procedure including the risks, benefits and alternatives for the proposed anesthesia with the patient or authorized representative who has indicated his/her understanding and acceptance.     Plan Discussed with:   Anesthesia Plan Comments:         Anesthesia Quick Evaluation

## 2016-07-26 NOTE — Op Note (Signed)
[facility] Patient Name: [name] Procedure Date: 07/26/2016 12:31 PM MRN: [id number] Date of Birth: 1961-09-28 Attending MD: Norvel Richards , MD CSN: [id number] Age: 55 Admit Type: Outpatient Procedure:                Colonoscopy with snare polypectomy Indications:              High risk colon cancer surveillance: Personal                            history of colon cancer Providers:                Norvel Richards, MD, Otis Peak B. Sharon Seller, RN,                            Randa Spike, Technician Referring MD:             Rosita Fire Medicines:                Propofol per Anesthesia Complications:            No immediate complications. Estimated Blood Loss:     Estimated blood loss: none. Procedure:                Pre-Anesthesia Assessment:                           - Prior to the procedure, a History and Physical                            was performed, and patient medications and                            allergies were reviewed. The patient's tolerance of                            previous anesthesia was also reviewed. The risks                            and benefits of the procedure and the sedation                            options and risks were discussed with the patient.                            All questions were answered, and informed consent                            was obtained. Prior Anticoagulants: The patient has                            taken no previous anticoagulant or antiplatelet                            agents. ASA Grade Assessment: III - A patient with  severe systemic disease. After reviewing the risks                            and benefits, the patient was deemed in                            satisfactory condition to undergo the procedure.                           After obtaining informed consent, the colonoscope                            was passed under direct vision. Throughout the                 procedure, the patient's blood pressure, pulse, and                            oxygen saturations were monitored continuously. The                            EC-[id number]) scope was introduced through                            the anus and advanced to the the cecum, identified                            by appendiceal orifice and ileocecal valve. The                            entire colon was not well visualized. The                            colonoscopy was technically difficult and complex                            due to inadequate bowel prep. Successful completion                            of the procedure was aided by changing the                            patient's position. The patient tolerated the                            procedure well. The quality of the bowel                            preparation was inadequate. The ileocecal valve,                            the appendiceal orifice and the rectum were                            photographed. Scope In: 12:38:14  PM Scope Out: 1:36:29 PM Scope Withdrawal Time: 0 hours 18 minutes 31 seconds  Total Procedure Duration: 0 hours 58 minutes 15 seconds  Findings:      The perianal and digital rectal examinations were normal. Preparation       inadequate. Rectal vault seen wellwell but unable retroflex due to the       size of the rectal vault.      Multiple diverticula were found in the sigmoid colon and descending       colon. Markedly redundant colon requiring a number of maneuvers       including changing of the patient's position and external abdominal       pressure to reach the cecum.      A 10 mm polyp was found in the hepatic flexure. The polyp was sessile.       The polyp was removed with a hot snare. Resection complete. Polyp was       was lost in the in deep haustral folds in the sett a poor prep..       Estimated blood loss: none.      The exam was otherwise without abnormality.Surgical  anastomosis found at       6 cm from the anal verge. Impression:               - Diverticulosis in the sigmoid colon and in the                            descending colon.                           - One 10 mm polyp at the hepatic flexure, removed                            with a hot snare. Resected and retrieved.                           - The examination was otherwise normal. Moderate Sedation:      Moderate (conscious) sedation was personally administered by an       anesthesia professional. The following parameters were monitored: oxygen       saturation, heart rate, blood pressure, and response to care. Total       physician intraservice time was 63 minutes. Recommendation:           - Patient has a contact number available for                            emergencies. The signs and symptoms of potential                            delayed complications were discussed with the                            patient. Return to normal activities tomorrow.                            Written discharge instructions were provided to the  patient.                           - Patient has a contact number available for                            emergencies. The signs and symptoms of potential                            delayed complications were discussed with the                            patient. Return to normal activities tomorrow.                            Written discharge instructions were provided to the                            patient.                           - Advance diet as tolerated.                           - Continue present medications.                           - Repeat colonoscopy in 1 year for surveillance.                           - Return to GI clinic in 1 year. Procedure Code(s):        --- Professional ---                           [phone], Colonoscopy, flexible; with removal of                            tumor(s), polyp(s), or other  lesion(s) by snare                            technique Diagnosis Code(s):        --- Professional ---                           [id number], Personal history of other malignant                            neoplasm of large intestine                           D12.3, Benign neoplasm of transverse colon (hepatic                            flexure or splenic flexure)                           K57.30, Diverticulosis  of large intestine without                            perforation or abscess without bleeding CPT copyright 2016 American Medical Association. All rights reserved. The codes documented in this report are preliminary and upon coder review may  be revised to meet current compliance requirements. [name]. Douglas Tunnell, MD Norvel Richards, MD 07/26/2016 2:26:12 PM This report has been signed electronically. Number of Addenda: 0

## 2016-07-26 NOTE — H&P (View-Only) (Signed)
Referring Provider: Rosita Fire, MD Primary Care Physician:  Rosita Fire, MD Primary GI:  Dr. Gala Romney  Chief Complaint  Patient presents with  . Colonoscopy    needs repeat    HPI:   [name] is a 55 y.o. male who presents to reschedule colonoscopy. He was last seen in our office 05/09/2016 for history of rectal cancer in 2016 status post surgical resection, radiation, and chemotherapy. Was found to be doing quite well at his last visit in one year since his most recent colonoscopy currently due for surveillance. His last colonoscopy noted poor prep and difficulty maintaining insufflation due to persistent coughing. He was arranged for repeat colonoscopy with extended prep and propofol/MAC. Colonoscopy was attempted on 05/21/2016 but aborted due to poor prep and the patient admitted to eating at least applesauce the day before and conflict with written and verbal instructions. Recommend repeat colonoscopy in 1 months for surveillance.  Today he states he's doing well overall. Admits eating applesauce during his prep. Emphasized the importance of a good prep and following directions closely. Denies abdominal pain, N/V, hematochezia, melena, unintentional weight loss, fever, chills. Denies chest pain, dyspnea, dizziness, lightheadedness, syncope, near syncope. Denies any other upper or lower GI symptoms.  Past Medical History:  Diagnosis Date  . Arthritis   . Cancer ([place])    rectal  . Depression   . Diabetes mellitus    "Borderline"  . GERD (gastroesophageal reflux disease)    No weakness  . High cholesterol   . Hypertension   . Neuropathy ([place])    "back missed up"  . Rectal cancer ([place])   . Sleep apnea     Past Surgical History:  Procedure Laterality Date  . BACK SURGERY    . BIOPSY  03/31/2015   Procedure: BIOPSY;  Surgeon: Daneil Dolin, MD;  Location: [facility];  Service: Endoscopy;;  . CHOLECYSTECTOMY    . COLONOSCOPY WITH PROPOFOL N/A 03/31/2015   Procedure:  COLONOSCOPY WITH PROPOFOL at cecum 0842; withdrawal time=23minutes;  Surgeon: Daneil Dolin, MD;  Location: [facility]S;  Service: Endoscopy;  Laterality: N/A;  . COLONOSCOPY WITH PROPOFOL N/A 05/21/2016   Procedure: COLONOSCOPY WITH PROPOFOL;  Surgeon: Daneil Dolin, MD;  Location: [facility] END[facility] SUITE;  Service: Endoscopy;  Laterality: N/A;  200 - moved to 12:45 - office calling pt with 11:15 arrival time  . FLEXIBLE SIGMOIDOSCOPY N/A 07/05/2015   Procedure: FLEXIBLE SIGMOIDOSCOPY;  Surgeon: [name], MD;  Location: [facility] END[facility];  Service: Endoscopy;  Laterality: N/A;  with tattoo  . POLYPECTOMY  03/31/2015   Procedure: POLYPECTOMY;  Surgeon: Daneil Dolin, MD;  Location: [facility];  Service: Endoscopy;;  . PORTACATH PLACEMENT N/A 08/09/2015   Procedure: INSERTION PORT-A-CATH LEFT SUBCLAVIAN;  Surgeon: [name], MD;  Location: [facility];  Service: General;  Laterality: N/A;  . XI ROBOTIC ASSISTED LOWER ANTERIOR RESECTION N/A 07/06/2015   Procedure: XI ROBOTIC ASSISTED LOWER ANTERIOR RESECTION, rigid proctoscopy;  Surgeon: [name], MD;  Location: [facility];  Service: General;  Laterality: N/A;    Current Outpatient Prescriptions  Medication Sig Dispense Refill  . amLODipine (NORVASC) 5 MG tablet Take 1 tablet (5 mg total) by mouth every morning. 30 tablet 2  . gabapentin (NEURONTIN) 800 MG tablet Take 1 tablet (800 mg total) by mouth 3 (three) times daily. 90 tablet 2  . HYDROcodone-acetaminophen (NORCO) 7.5-325 MG tablet Take 1 tablet by mouth every 12 (twelve) hours as needed for moderate pain. 45 tablet 0  .  lisinopril (PRINIVIL,ZESTRIL) 40 MG tablet Take 1 tablet (40 mg total) by mouth every morning. 30 tablet 2  . NON FORMULARY PT HAS A C-PAP MACHINE    . nortriptyline (PAMELOR) 75 MG capsule Take 1 capsule (75 mg total) by mouth at bedtime. 30 capsule 2  . tiZANidine (ZANAFLEX) 2 MG tablet Take 1 tablet (2 mg total) by mouth every 8 (eight) hours as needed for muscle spasms. 30 tablet 2  .  traMADol (ULTRAM) 50 MG tablet May take 1-2 tabs every 6 hours as needed for pain 120 tablet 0   No current facility-administered medications for this visit.     Allergies as of 06/27/2016  . (No Known Allergies)    Family History  Problem Relation Age of Onset  . Hypertension      "Not sure who, I don't know much about my family"  . Diabetes      "Not sure who, I don't know much about my family"  . Colon cancer Neg Hx     "Not that I know of"  . Colon polyps Neg Hx     "not that I know of"    Social History   Social History  . Marital status: Legally Separated    Spouse name: N/A  . Number of children: N/A  . Years of education: N/A   Social History Main Topics  . Smoking status: Former Smoker    Packs/day: 0.25    Years: 2.00    Types: Cigarettes    Quit date: 03/23/2006  . Smokeless tobacco: Never Used  . Alcohol use No  . Drug use: No  . Sexual activity: Not Currently   Other Topics Concern  . None   Social History Narrative  . None    Review of Systems: General: Negative for anorexia, weight loss, fever, chills, fatigue, weakness. ENT: Negative for hoarseness, difficulty swallowing. CV: Negative for chest pain, angina, palpitations, peripheral edema.  Respiratory: Negative for dyspnea at rest, cough, sputum, wheezing.  GI: See history of present illness. Endo: Negative for unusual weight change.  Heme: Negative for bruising or bleeding.   Physical Exam: BP (!) 135/91   Pulse 77   Temp 97.3 F (36.3 C) (Oral)   Ht 5\' 3"  (1.6 m)   Wt 198 lb 12.8 oz (90.2 kg)   BMI 35.22 kg/m  General:   Alert and oriented. Pleasant and cooperative. Well-nourished and well-developed.  Ears:  Normal auditory acuity. Cardiovascular:  S1, S2 present without murmurs appreciated. Extremities without clubbing or edema. Respiratory:  Clear to auscultation bilaterally. No wheezes, rales, or rhonchi. No distress.  Gastrointestinal:  +BS, rounded but soft, non-tender and  non-distended. No HSM noted. No guarding or rebound. No masses appreciated.  Rectal:  Deferred  Musculoskalatal:  Symmetrical without gross deformities. Neurologic:  Alert and oriented x4;  grossly normal neurologically. Psych:  Alert and cooperative. Normal mood and affect. Heme/Lymph/Immune: No excessive bruising noted.    06/27/2016 4:06 PM   Disclaimer: This note was dictated with voice recognition software. Similar sounding words can inadvertently be transcribed and may not be corrected upon review.

## 2016-07-26 NOTE — Transfer of Care (Signed)
Immediate Anesthesia Transfer of Care Note  Patient: [name]  Procedure(s) Performed: Procedure(s) with comments: COLONOSCOPY WITH PROPOFOL (N/A) - 115  Patient Location: PACU  Anesthesia Type:MAC  Level of Consciousness: awake, alert , oriented and patient cooperative  Airway & Oxygen Therapy: Patient Spontanous Breathing and Patient connected to face mask oxygen  Post-op Assessment: Report given to RN and Post -op Vital signs reviewed and stable  Post vital signs: Reviewed and stable  Last Vitals:  Vitals:   07/26/16 1155 07/26/16 1200  BP: 118/82 119/81  Resp: 16 10  Temp:      Last Pain:  Vitals:   07/26/16 1051  TempSrc: Oral      Patients Stated Pain Goal: 8 ([phone])  Complications: No apparent anesthesia complications

## 2016-07-26 NOTE — Interval H&P Note (Signed)
History and Physical Interval Note:  07/26/2016 12:27 PM  [name]  has presented today for surgery, with the diagnosis of history of rectal cancer  The various methods of treatment have been discussed with the patient and family. After consideration of risks, benefits and other options for treatment, the patient has consented to  Procedure(s) with comments: COLONOSCOPY WITH PROPOFOL (N/A) - 115 as a surgical intervention .  The patient's history has been reviewed, patient examined, no change in status, stable for surgery.  I have reviewed the patient's chart and labs.  Questions were answered to the patient's satisfaction.      No change. Patient states he was compliant with prep instructions.  Surveillance colonoscopy per plan.  The risks, benefits, limitations, alternatives and imponderables have been reviewed with the patient. Questions have been answered. All parties are agreeable.  Manus Rudd

## 2016-07-26 NOTE — Anesthesia Postprocedure Evaluation (Signed)
Anesthesia Post Note  Patient: [name]  Procedure(s) Performed: Procedure(s) (LRB): COLONOSCOPY WITH PROPOFOL (N/A)  Patient location during evaluation: PACU Anesthesia Type: MAC Level of consciousness: awake and alert and oriented Pain management: pain level controlled Vital Signs Assessment: post-procedure vital signs reviewed and stable Respiratory status: spontaneous breathing and patient connected to face mask oxygen Cardiovascular status: stable Postop Assessment: no signs of nausea or vomiting Anesthetic complications: no    Last Vitals:  Vitals:   07/26/16 1200 07/26/16 1345  BP: 119/81 113/77  Pulse:  84  Resp: 10 14  Temp:  36.5 C    Last Pain:  Vitals:   07/26/16 1051  TempSrc: Oral                 ADAMS, AMY A

## 2016-07-26 NOTE — Anesthesia Procedure Notes (Signed)
Procedure Name: MAC Date/Time: 07/26/2016 12:32 PM Performed by: Andree Elk, Vannessa Godown A Pre-anesthesia Checklist: Patient identified, Emergency Drugs available, Suction available, Patient being monitored and Timeout performed Oxygen Delivery Method: Simple face mask

## 2016-07-31 ENCOUNTER — Ambulatory Visit: Payer: Self-pay | Admitting: Licensed Clinical Social Worker

## 2016-07-31 ENCOUNTER — Encounter (HOSPITAL_COMMUNITY): Payer: Self-pay | Admitting: Internal Medicine

## 2016-08-13 ENCOUNTER — Encounter (HOSPITAL_COMMUNITY): Payer: Commercial Managed Care - HMO | Attending: Hematology & Oncology | Admitting: Hematology & Oncology

## 2016-08-13 ENCOUNTER — Encounter (HOSPITAL_BASED_OUTPATIENT_CLINIC_OR_DEPARTMENT_OTHER): Payer: Commercial Managed Care - HMO

## 2016-08-13 ENCOUNTER — Encounter (HOSPITAL_COMMUNITY): Payer: Self-pay | Admitting: Hematology & Oncology

## 2016-08-13 VITALS — BP 126/78 | HR 94 | Temp 98.3°F | Resp 18 | Wt 201.7 lb

## 2016-08-13 DIAGNOSIS — Z95828 Presence of other vascular implants and grafts: Secondary | ICD-10-CM

## 2016-08-13 DIAGNOSIS — Z452 Encounter for adjustment and management of vascular access device: Secondary | ICD-10-CM

## 2016-08-13 DIAGNOSIS — D61818 Other pancytopenia: Secondary | ICD-10-CM

## 2016-08-13 DIAGNOSIS — C20 Malignant neoplasm of rectum: Secondary | ICD-10-CM

## 2016-08-13 LAB — COMPREHENSIVE METABOLIC PANEL
ALT: 28 U/L (ref 17–63)
AST: 29 U/L (ref 15–41)
Albumin: 4.1 g/dL (ref 3.5–5.0)
Alkaline Phosphatase: 82 U/L (ref 38–126)
Anion gap: 1 — ABNORMAL LOW (ref 5–15)
BUN: 11 mg/dL (ref 6–20)
CHLORIDE: 109 mmol/L (ref 101–111)
CO2: 26 mmol/L (ref 22–32)
CREATININE: 0.88 mg/dL (ref 0.61–1.24)
Calcium: 8.9 mg/dL (ref 8.9–10.3)
GFR calc Af Amer: >60 mL/min (ref >60)
GFR calc non Af Amer: >60 mL/min (ref >60)
Glucose, Bld: 108 mg/dL — ABNORMAL HIGH (ref 65–99)
POTASSIUM: 3.8 mmol/L (ref 3.5–5.1)
SODIUM: 136 mmol/L (ref 135–145)
Total Bilirubin: 0.5 mg/dL (ref 0.3–1.2)
Total Protein: 7.8 g/dL (ref 6.5–8.1)

## 2016-08-13 LAB — CBC WITH DIFFERENTIAL/PLATELET
BASOS ABS: 0 10*3/uL (ref 0.0–0.1)
BASOS PCT: 0 %
EOS ABS: 0.4 10*3/uL (ref 0.0–0.7)
EOS PCT: 11 %
HCT: 37.7 % — ABNORMAL LOW (ref 39.0–52.0)
Hemoglobin: 12.5 g/dL — ABNORMAL LOW (ref 13.0–17.0)
Lymphocytes Relative: 20 %
Lymphs Abs: 0.7 10*3/uL (ref 0.7–4.0)
MCH: 29.6 pg (ref 26.0–34.0)
MCHC: 33.2 g/dL (ref 30.0–36.0)
MCV: 89.1 fL (ref 78.0–100.0)
Monocytes Absolute: 0.3 10*3/uL (ref 0.1–1.0)
Monocytes Relative: 10 %
Neutro Abs: 2 10*3/uL (ref 1.7–7.7)
Neutrophils Relative %: 59 %
PLATELETS: 150 10*3/uL (ref 150–400)
RBC: 4.23 MIL/uL (ref 4.22–5.81)
RDW: 12.8 % (ref 11.5–15.5)
WBC: 3.4 10*3/uL — AB (ref 4.0–10.5)

## 2016-08-13 MED ORDER — TRAMADOL HCL 50 MG PO TABS: ORAL_TABLET | ORAL | 0 refills | Status: DC

## 2016-08-13 MED ORDER — HYDROCODONE-ACETAMINOPHEN 7.5-325 MG PO TABS: 1.0000 | ORAL_TABLET | Freq: Two times a day (BID) | ORAL | 0 refills | Status: DC | PRN

## 2016-08-13 MED ORDER — SODIUM CHLORIDE 0.9% FLUSH
10.0000 mL | INTRAVENOUS | Status: DC | PRN
  Administered 2016-08-13: 10 mL via INTRAVENOUS
  Filled 2016-08-13: qty 10

## 2016-08-13 MED ORDER — HEPARIN SOD (PORK) LOCK FLUSH 100 UNIT/ML IV SOLN
500.0000 [IU] | Freq: Once | INTRAVENOUS | Status: AC
  Administered 2016-08-13: 500 [IU] via INTRAVENOUS

## 2016-08-13 NOTE — Progress Notes (Signed)
[name] presented for Portacath access and flush.  Portacath located left chest wall accessed with  H 20 needle.  Good blood return present. Portacath flushed with 73ml NS and 500U/71ml Heparin and needle removed intact.  Procedure tolerated well and without incident.

## 2016-08-13 NOTE — Patient Instructions (Signed)
[place] at [facility] Discharge Instructions  RECOMMENDATIONS MADE BY THE CONSULTANT AND ANY TEST RESULTS WILL BE SENT TO YOUR REFERRING PHYSICIAN.  Port flush with labs today. Return as scheduled for port flushes. Return as scheduled for office visit.   Thank you for choosing [place] at [facility] to provide your oncology and hematology care.  To afford each patient quality time with our provider, please arrive at least 15 minutes before your scheduled appointment time.   Beginning January 23rd 2017 lab work for the [facility] will be done in the  Main lab at [facility] on 1st floor. If you have a lab appointment with the [facility] please come in thru the  Main Entrance and check in at the main information desk  You need to re-schedule your appointment should you arrive 10 or more minutes late.  We strive to give you quality time with our providers, and arriving late affects you and other patients whose appointments are after yours.  Also, if you no show three or more times for appointments you may be dismissed from the clinic at the providers discretion.     Again, thank you for choosing [facility].  Our hope is that these requests will decrease the amount of time that you wait before being seen by our physicians.       _____________________________________________________________  Should you have questions after your visit to [facility], please contact our office at [phone] between the hours of 8:30 a.m. and 4:30 p.m.  Voicemails left after 4:30 p.m. will not be returned until the following business day.  For prescription refill requests, have your pharmacy contact our office.         Resources For Cancer Patients and their Caregivers ? American Cancer Society: Can assist with transportation, wigs, general needs, runs Look Good Feel Better.        865-619-1193 ? Cancer Care: Provides  financial assistance, online support groups, medication/co-pay assistance.  1-800-813-HOPE 712-216-8956) ? [facility] Assists [place] cancer patients and their families through emotional , educational and financial support.  740-705-3668 ? [place] DSS Where to apply for food stamps, Medicaid and utility assistance. 2132319207 ? [employer]ATS: Transportation to medical appointments. (717)338-1967 ? Social Security Administration: May apply for disability if have a Stage IV cancer. 540-427-1671 [phone] ? [employer], Disability and Transit Services: Assists with nutrition, care and transit needs. [place] Support Programs: @10RELATIVEDAYS @ > Cancer Support Group  2nd Tuesday of the month 1pm-2pm, Journey Room  > Creative Journey  3rd Tuesday of the month 1130am-1pm, Journey Room  > Look Good Feel Better  1st Wednesday of the month 10am-12 noon, Journey Room (Call [place] to register [phone])

## 2016-08-13 NOTE — Patient Instructions (Addendum)
[place] at [facility] Discharge Instructions  RECOMMENDATIONS MADE BY THE CONSULTANT AND ANY TEST RESULTS WILL BE SENT TO YOUR REFERRING PHYSICIAN.  You saw Dr. Whitney Muse today. Return in 4 months for follow up and labs.   Thank you for choosing [place] at [facility] to provide your oncology and hematology care.  To afford each patient quality time with our provider, please arrive at least 15 minutes before your scheduled appointment time.   Beginning January 23rd 2017 lab work for the [facility] will be done in the  Main lab at [facility] on 1st floor. If you have a lab appointment with the [facility] please come in thru the  Main Entrance and check in at the main information desk  You need to re-schedule your appointment should you arrive 10 or more minutes late.  We strive to give you quality time with our providers, and arriving late affects you and other patients whose appointments are after yours.  Also, if you no show three or more times for appointments you may be dismissed from the clinic at the providers discretion.     Again, thank you for choosing [facility].  Our hope is that these requests will decrease the amount of time that you wait before being seen by our physicians.       _____________________________________________________________  Should you have questions after your visit to [facility], please contact our office at [phone] between the hours of 8:30 a.m. and 4:30 p.m.  Voicemails left after 4:30 p.m. will not be returned until the following business day.  For prescription refill requests, have your pharmacy contact our office.         Resources For Cancer Patients and their Caregivers ? American Cancer Society: Can assist with transportation, wigs, general needs, runs Look Good Feel Better.        479-444-0011 ? Cancer Care: Provides financial assistance, online  support groups, medication/co-pay assistance.  1-800-813-HOPE 640-250-3881) ? [facility] Assists [place] cancer patients and their families through emotional , educational and financial support.  256-102-7877 ? [place] DSS Where to apply for food stamps, Medicaid and utility assistance. 619-441-3498 ? RCATS: Transportation to medical appointments. 561-877-4123 ? Social Security Administration: May apply for disability if have a Stage IV cancer. 5748329339 581-037-5742 ? [employer], Disability and Transit Services: Assists with nutrition, care and transit needs. [place] Support Programs: @10RELATIVEDAYS @ > Cancer Support Group  2nd Tuesday of the month 1pm-2pm, Journey Room  > Creative Journey  3rd Tuesday of the month 1130am-1pm, Journey Room  > Look Good Feel Better  1st Wednesday of the month 10am-12 noon, Journey Room (Call [place] to register [phone])

## 2016-08-13 NOTE — Progress Notes (Signed)
[place] [facility] Progress Note  Patient Care Team: Rosita Fire, MD as PCP - General (Internal Medicine) Daneil Dolin, MD as Consulting Physician (Gastroenterology)  CHIEF COMPLAINTS/PURPOSE OF CONSULTATION:  Stage IIIA Rectal carcinoma Robotic assisted LAR, rigid proctoscopy with Dr. [name] 1/93/7902 Lymph nodes: number examined 14; number positive: 1 Pathologic Staging: pT1, pN1a, pMX Normal preoperative CEA Adjuvant FOLFOX Plans for concurrent XRT/5-FU  HISTORY OF PRESENTING ILLNESS:  [name] 55 y.o. male is here for follow-up of his rectal cancer. He has undergone definitive surgical therapy and did remarkably well. Unfortunately he did have one lymph node that contained metastatic disease. CT imaging prior to surgery revealed no evidence of metastatic disease. He has completed all recommended therapy. He presents today to review recent imaging.   [name] is unaccompanied.   He had a colonoscopy last week 07/26/2016 and was told it was good. He was not moving his bowels after the colonoscopy but his bowls have returned to normal. He denies any BRBPR, no other bowel related complaints.   His appetite "is getting too serious. I'm eating good now". He is staying active and sleeping well. He walks every other day, if he walks every day he becomes short of breath.   His breathing has been good and he denies chest pain. He denies headaches.  States his sinuses bother him when the weather starts getting cold.   He no longer sees Dr. [name] in [place].   No new pain.   MEDICAL HISTORY:  Past Medical History:  Diagnosis Date  . Arthritis   . Cancer ([place])    rectal  . Depression   . Diabetes mellitus    "Borderline"  . GERD (gastroesophageal reflux disease)    No weakness  . High cholesterol   . Hypertension   . Neuropathy ([place])    "back missed up"  . Rectal cancer ([place])   . Sleep apnea     SURGICAL HISTORY: Past Surgical  History:  Procedure Laterality Date  . BACK SURGERY    . BIOPSY  03/31/2015   Procedure: BIOPSY;  Surgeon: Daneil Dolin, MD;  Location: [facility] ORS;  Service: Endoscopy;;  . CHOLECYSTECTOMY    . COLONOSCOPY WITH PROPOFOL N/A 03/31/2015   Procedure: COLONOSCOPY WITH PROPOFOL at cecum 0842; withdrawal time=41mnutes;  Surgeon: RDaneil Dolin MD;  Location: [facility] ORS;  Service: Endoscopy;  Laterality: N/A;  . COLONOSCOPY WITH PROPOFOL N/A 05/21/2016   Procedure: COLONOSCOPY WITH PROPOFOL;  Surgeon: RDaneil Dolin MD;  Location[facility];  Service: Endoscopy;  Laterality: N/A;  200 - moved to 12:45 - office calling pt with 11:15 arrival time  . COLONOSCOPY WITH PROPOFOL N/A 07/26/2016   Procedure: COLONOSCOPY WITH PROPOFOL;  Surgeon: RDaneil Dolin MD;  Location: [facility];  Service: Endoscopy;  Laterality: N/A;  115  . FLEXIBLE SIGMOIDOSCOPY N/A 07/05/2015   Procedure: FLEXIBLE SIGMOIDOSCOPY;  Surgeon: ALeighton Ruff MD;  Location: [facility];  Service: Endoscopy;  Laterality: N/A;  with tattoo  . POLYPECTOMY  03/31/2015   Procedure: POLYPECTOMY;  Surgeon: RDaneil Dolin MD;  Location: [facility];  Service: Endoscopy;;  . PORTACATH PLACEMENT N/A 08/09/2015   Procedure: INSERTION PORT-A-CATH LEFT SUBCLAVIAN;  Surgeon: ALeighton Ruff MD;  [place] [facility]  Service: General;  Laterality: N/A;  . XI ROBOTIC ASSISTED LOWER ANTERIOR RESECTION N/A 07/06/2015   Procedure: XI ROBOTIC ASSISTED LOWER ANTERIOR RESECTION, rigid proctoscopy;  Surgeon: ALeighton Ruff MD;  [facility];  Service: General;  Laterality: N/A;    SOCIAL HISTORY: Social History   Social History  . Marital status: Legally Separated    Spouse name: N/A  . Number of children: N/A  . Years of education: N/A   Occupational History  . Not on file.   Social History Main Topics  . Smoking status: Former Smoker    Packs/day: 0.25    Years: 2.00    Types: Cigarettes    Quit date: 03/23/2006  . Smokeless tobacco: Never Used  .  Alcohol use No  . Drug use: No  . Sexual activity: Not Currently   Other Topics Concern  . Not on file   Social History Narrative  . 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 Hypertension      "Not sure who, I don't know much about my family"  . Diabetes      "Not sure who, I don't know much about my family"  . Colon cancer Neg Hx     "Not that I know of"  . Colon polyps Neg Hx     "not that I know of"   '@FAMSTP'$ (<SUBSCRIPT> error)@ Mother deceased, 15, liver cancer Father living, 32 3 brothers 1 sister  ALLERGIES:  has No Known Allergies.  MEDICATIONS:  Current Outpatient Prescriptions  Medication Sig Dispense Refill  . amLODipine (NORVASC) 5 MG tablet Take 1 tablet (5 mg total) by mouth every morning. 30 tablet 2  . gabapentin (NEURONTIN) 800 MG tablet Take 1 tablet (800 mg total) by mouth 3 (three) times daily. 90 tablet 2  . HYDROcodone-acetaminophen (NORCO) 7.5-325 MG tablet Take 1 tablet by mouth every 12 (twelve) hours as needed for moderate pain. DO NOT FILL BEFORE 08/19/2016 45 tablet 0  . lisinopril (PRINIVIL,ZESTRIL) 40 MG tablet Take 1 tablet (40 mg total) by mouth every morning. 30 tablet 2  . NON FORMULARY PT HAS A C-PAP MACHINE    . nortriptyline (PAMELOR) 75 MG capsule Take 1 capsule (75 mg total) by mouth at bedtime. 30 capsule 2  . polyethylene glycol-electrolytes (TRILYTE) 420 g solution Take 4,000 mLs by mouth as directed. 4000 mL 0  . tiZANidine (ZANAFLEX) 2 MG tablet Take 1 tablet (2 mg total) by mouth every 8 (eight) hours as needed for muscle spasms. 30 tablet 2  . traMADol (ULTRAM) 50 MG tablet May take 1-2 tabs every 6 hours as needed for pain 120 tablet 0   No current facility-administered medications for this visit.     Review of Systems  Constitutional: Negative.   Negative for weight loss.  HENT: Negative.   Eyes: Negative.   Respiratory: Positive for shortness of breath.        SOB with exertion  Cardiovascular: Negative.  Negative for chest pain.  Gastrointestinal: Negative.   Genitourinary: Negative.   Musculoskeletal: Negative.   Skin: Negative.   Neurological: Negative.  Negative for headaches.  Endo/Heme/Allergies: Negative.   Psychiatric/Behavioral: Negative.  The patient does not have insomnia.   All other systems reviewed and are negative. 14 point ROS was done and is otherwise as detailed above or in HPI   Physical Exam   Vitals with BMI 08/13/2016  Height   Weight 201 lbs 11 oz  BMI   Systolic 025  Diastolic 78  Pulse 94  Respirations 18    Physical Exam  Constitutional: He is oriented to person, place, and time and well-developed, well-nourished, and in no distress.  Obese  HENT:  Head: Normocephalic and atraumatic.  Mouth/Throat: Oropharynx is clear and moist.  Eyes: Conjunctivae and EOM are normal. Pupils are equal, round, and reactive to light.  Neck: Normal range of motion. Neck supple.  Cardiovascular: Normal rate, regular rhythm and normal heart sounds.   Pulmonary/Chest: Effort normal and breath sounds normal.  Abdominal: Soft. Bowel sounds are normal.  Musculoskeletal: Normal range of motion.  Neurological: He is alert and oriented to person, place, and time. Gait normal.  Skin: Skin is warm and dry.  Nursing note and vitals reviewed.  LABORATORY DATA:  I have reviewed the data as listed CBC    Component Value Date/Time   WBC 3.4 (L) 08/13/2016 1445   RBC 4.23 08/13/2016 1445   HGB 12.5 (L) 08/13/2016 1445   HCT 37.7 (L) 08/13/2016 1445   PLT 150 08/13/2016 1445   MCV 89.1 08/13/2016 1445   MCH 29.6 08/13/2016 1445   MCHC 33.2 08/13/2016 1445   RDW 12.8 08/13/2016 1445   LYMPHSABS 0.7 08/13/2016 1445   MONOABS 0.3 08/13/2016 1445   EOSABS 0.4 08/13/2016 1445   BASOSABS 0.0 08/13/2016 1445     CMP     Component Value Date/Time   NA 136 08/13/2016 1445   K 3.8 08/13/2016 1445   CL 109 08/13/2016 1445   CO2 26 08/13/2016 1445   GLUCOSE 108 (H) 08/13/2016 1445   BUN 11 08/13/2016 1445   CREATININE 0.88 08/13/2016 1445   CALCIUM 8.9 08/13/2016 1445   PROT 7.8 08/13/2016 1445   ALBUMIN 4.1 08/13/2016 1445   AST 29 08/13/2016 1445   ALT 28 08/13/2016 1445   ALKPHOS 82 08/13/2016 1445   BILITOT 0.5 08/13/2016 1445   GFRNONAA >60 08/13/2016 1445   GFRAA >60 08/13/2016 1445    PATHOLOGY:REPORT OF SURGICAL PATHOLOGY ADDITIONAL INFORMATION: 1. Mismatch Repair (MMR) Protein Immunohistochemistry (IHC) IHC Expression Result: MLH1: Preserved nuclear expression (greater 50% tumor expression) MSH2: Preserved nuclear expression (greater 50% tumor expression) MSH6: Preserved nuclear expression (greater 50% tumor expression) PMS2: Preserved nuclear expression (greater 50% tumor expression) * Internal control demonstrates intact nuclear expression Interpretation: NORMAL There is preserved expression of the major and minor MMR proteins. There is a very low probability that microsatellite instability (MSI) is present. However, certain clinically significant MMR protein mutations may result in preservation of nuclear expression. It is recommended that the preservation of protein expression be correlated with molecular based MSI testing. References: 1. Guidelines on Genetic Evaluation and Management of Lynch Syndrome: A Consensus Statement by the [place] Multi-Society Task Force on Colorectal Cancer Gae Dry. Sherlie Ban , MD, and others . Am Nicki [place] 2014; [phone]; doi: 10.1038/ajg.2014.186; published online 09 June 2013 2. Outcomes of screening endometrial cancer patients for Lynch syndrome by patient-administered checklist. Olena Heckle MS, and others. Gynecol Oncol 2013;131(3):619-623. Mali RUND DO Pathologist, Electronic Signature ( Signed 07/11/2015) FINAL  DIAGNOSIS Diagnosis 1. Colon, segmental resection for tumor, rectosigmoid - INVASIVE ADENOCARCINOMA, SEE COMMENT. - TUMOR INVADES INTO SUBMUCOSA. 1 of 4 FINAL for DEVYNN, SCHEFF A [phone]) Diagnosis(continued) - ONE LYMPH NODE, POSITIVE FOR METASTATIC TUMOR (1/14). - SURGICAL MARGINS, NEGATIVE FOR TUMOR. - SEE TUMOR SYNOPTIC TEMPLATE BELOW. 2. Colon, resection margin (donut), final distal margin - BENIGN COLON. - NEGATIVE FOR DYSPLASIA OR MALIGNANCY. Microscopic Comment 1. COLON AND RECTUM (INCLUDING TRANS-ANAL RESECTION): Specimen: Rectosigmoid Procedure: Resection Tumor site: Mid  rectum Specimen integrity: Intact Macroscopic intactness of mesorectum: Near complete Macroscopic tumor perforation: Absent Invasive tumor: Maximum size: 2.0 cm Histologic type(s): Adenocarcinoma Histologic grade and differentiation: G2: moderately differentiated/low grade Type of polyp in which invasive carcinoma arose: Adenoma with high grade dysplasia Microscopic extension of invasive tumor: Tumor extends into submucosa and is immediately adjacent to muscularis propria. Lymph-Vascular invasion: Present Peri-neural invasion: Present Tumor deposit(s) (discontinuous extramural extension): Absent Resection margins: Proximal margin: Negative Distal margin: Negative Circumferential (radial) (posterior ascending, posterior descending; lateral and posterior mid-rectum; and entire lower 1/3 rectum): Negative Mesenteric margin (sigmoid and transverse): Negative Distance closest margin (if all above margins negative): 1.0 cm (distal) Treatment effect (neo-adjuvant therapy): None Additional polyp(s): None Non-neoplastic findings: None Lymph nodes: number examined 14; number positive: 1 Pathologic Staging: pT1, pN1a, pMX Ancillary studies: Per the [place] Gastrointestinal Oncology Working group Guidelines, tumor will be submitted for both microsatellite instability by PCR and mismatch repair  protein expression by immunohistochemistry. The results will be reported in an addendum. (CR:kh 07-08-15) Mali RUND DO Pathologist, Electronic Signature (Case signed 07/08/2015) Specimen Gross and Clinical Information 2 of 4 FINAL for BENNEY, [place] A [phone]) Specimen(s) Obtained: 1. Colon, segmental resection for tumor, rectosigmoid 2. Colon, resection margin (donut), final distal margin Specimen Clinical Information 1. rectal cancer (kp) Gross 1. Specimen: Rectosigmoid, to include at least middle third of rectum. Specimen integrity: Intact. Specimen length: 31 cm. Mesorectal intactness: Near complete. Tumor location: Middle third of rectum, posterior wall, 6.0 cm from the sigmorectal junction. Tumor size: 2.0 cm in diameter tan red indurated, sessile ill-defined mass. Percent of bowel circumference involved: Approximately 20%. Tumor distance to margins: Proximal: 28 cm. Distal: 1 cm. Mesenteric (sigmoid and transverse): 11 cm. Radial (posterior ascending, posterior descending; lateral and posterior mid-rectum; and entire lower 1/3 rectum): 0.5 cm to the mesorectal soft tissue margin. Macroscopic extent of tumor invasion: The tumor involves, but does not grossly completely transect the muscularis. Total presumed lymph nodes: 14 possible lymph nodes ranging from 0.1 to 0.8 cm. Extramural satellite tumor nodules: None. Mucosal polyp(s): None. Additional findings: None. Block summary: A = shave of proximal margin B,C = shave of entire distal margin D-H = tumor, entirely submitted I = four nodes, whole J = four nodes, whole K = four nodes, whole L = two nodes, whole Total: 12 blocks submitted 2. Received in formalin is a 1.1 cm in diameter and 1.3 cm thick portion of tissue with tan to hyperemic, smooth mucosa on one end. Sectioned and entirely submitted in one block. (SW:ds 07/07/15) Stain(s) used in Diagnosis: The following stain(s) were used in diagnosing the case:  MSH6, MLH1, MSH2, PMS2. The control(s) stained appropriately. Disclaimer Some of these immunohistochemical stains may have been developed and the performance characteristics determined by [facility]. Some may not have been cleared or approved by the U.S. Food and Drug Administration. The FDA has determined that such clearance or approval is not necessary. This test is used for clinical purposes. It should not be regarded as investigational or for research. This laboratory is certified under the [place] (CLIA-88) as qualified to perform high complexity clinical laboratory testing. Report signed out from the following location(s) Technical Component was performed at [facility]. [address],[place] [address].[id number]., 3 of 4 FINAL for ADITHYA, DIFRANCESCO A [phone]) Report signed out from the following location(s)(continued) Technical Component was performed at [facility], [name], [place] [address]. CLIA #: [id number], Technical  component and interpretation was performed at [facility] [address], [place], [place] [address]. CLIA #: [id number], 4 of 4   RADIOLOGY: I have reviewed the images below and agree with the reported results Study Result   CLINICAL DATA:  Patient with history of stage III rectal carcinoma status post surgery, chemo and radiation.  EXAM: CT ABDOMEN AND PELVIS WITH CONTRAST  TECHNIQUE: Multidetector CT imaging of the abdomen and pelvis was performed using the standard protocol following bolus administration of intravenous contrast.  CONTRAST:  163m ISOVUE-300 IOPAMIDOL (ISOVUE-300) INJECTION 61%  COMPARISON:  CT abdomen pelvis 06/08/2015.  FINDINGS: Lower chest: Normal heart size. No consolidative pulmonary opacities. No pleural effusion.  Hepatobiliary: Liver is normal in size and contour. No  focal lesion identified. Patient status post cholecystectomy. No intrahepatic or extrahepatic biliary ductal dilatation.  Pancreas: Unremarkable.  Spleen: Unremarkable.  Adrenals/Urinary Tract: The adrenal glands are normal. Kidneys enhance symmetrically with contrast. Stable bilateral renal hypodensities, too small to characterize. Urinary bladder is unremarkable.  Stomach/Bowel: Postsurgical changes involving the rectum. There is presacral soft tissue thickening which is new from prior and likely posttreatment related. Large amount of stool within the colon. No evidence for bowel obstruction. Normal morphology of the stomach. No free intraperitoneal air.  Vascular/Lymphatic: Normal caliber abdominal aorta. No retroperitoneal lymphadenopathy.  Other: Normal caliber abdominal aorta. No retroperitoneal lymphadenopathy.  Musculoskeletal: Lower thoracic lumbar spine degenerative changes. No aggressive or acute appearing osseous lesions. Marked degenerative changes of the left-greater-than-right hip joints.  IMPRESSION: Postsurgical changes involving the rectum. New presacral soft tissue thickening likely post treatment related. Recommend attention on followup.  Large amount of stool in the colon compatible with constipation.  No evidence for distant metastatic disease.   Electronically Signed   By: DLovey NewcomerM.D.   On: 06/19/2016 12:09     ASSESSMENT & PLAN:  T1, N1, M0 adenocarcinoma of the rectum, Stage IIIA Robotic assisted LAR with Dr. ALeighton RuffNormal preoperative CEA Adjuvant FOLFOX  Adjuvant concurrent 5-FU/XRT Pancytopenia (? Post treatment related)  He has finished all therapy. Recent scans and C-scope were unremarkable.   I reviewed NCCN guidelines regarding follow-up. Pre-op CEA was not elevated.   NCCN guidelines for surveillance for Colon cancer are as follows (1.2017): B. Stage II, Stage III 1. H+P every 3-6 months x 2 years and  then every 6 months for a total of 5 years  2. CEA every 3-6 months x 2 years and then every 6 months for a total of 5 years  3. CT CAP every 6-12 months (category 2B for frequency < 12 months) for a total of 5 years . 4.  Colonoscopy in 1 year except if no preoperative colonoscopy due to obstructing lesion, colonoscopy in 3-6 months.  A. If advanced adenoma, repeat in 1 year B. If no advanced adenoma, repeat in 3 years, then every 5 years 5. PET/CT scan is not recommended.  The patient underwent a colonoscopy with Dr. RGala Romneyon 07/26/16.  I have written his hydrocodone and tramadol to be refilled next month (10/1). [place] discussed with him in the past that at some point he will need to re-establish.   He will return for follow up in 4 months with PE, labs.   Orders Placed This Encounter  Procedures  . CBC with Differential    Standing Status:   Future    Standing Expiration Date:  08/13/2017  . Comprehensive metabolic panel    Standing Status:   Future    Standing Expiration Date:   08/13/2017  . CEA    Standing Status:   Future    Standing Expiration Date:   08/13/2017     All questions were answered. The patient knows to call the clinic with any problems, questions or concerns.  This document serves as a record of services personally performed by Ancil Linsey, MD. It was created on her behalf by Arlyce Harman, a trained medical scribe. The creation of this record is based on the scribe's personal observations and the provider's statements to them. This document has been checked and approved by the attending provider.  I have reviewed the above documentation for accuracy and completeness, and I agree with the above.  This note was electronically signed.  Kelby Fam. Whitney Muse, MD

## 2016-08-14 ENCOUNTER — Encounter (HOSPITAL_COMMUNITY): Payer: Self-pay | Admitting: Hematology & Oncology

## 2016-08-14 LAB — CEA: CEA: 4.1 ng/mL (ref 0.0–4.7)

## 2016-08-16 ENCOUNTER — Encounter (HOSPITAL_COMMUNITY): Payer: Commercial Managed Care - HMO

## 2016-08-22 DIAGNOSIS — R7309 Other abnormal glucose: Secondary | ICD-10-CM | POA: Diagnosis not present

## 2016-08-22 DIAGNOSIS — I10 Essential (primary) hypertension: Secondary | ICD-10-CM | POA: Diagnosis not present

## 2016-08-22 DIAGNOSIS — C20 Malignant neoplasm of rectum: Secondary | ICD-10-CM | POA: Diagnosis not present

## 2016-08-23 DIAGNOSIS — C20 Malignant neoplasm of rectum: Secondary | ICD-10-CM | POA: Diagnosis not present

## 2016-08-23 DIAGNOSIS — Z923 Personal history of irradiation: Secondary | ICD-10-CM | POA: Diagnosis not present

## 2016-08-23 DIAGNOSIS — Z9221 Personal history of antineoplastic chemotherapy: Secondary | ICD-10-CM | POA: Diagnosis not present

## 2016-09-17 ENCOUNTER — Other Ambulatory Visit (HOSPITAL_COMMUNITY): Payer: Self-pay | Admitting: Oncology

## 2016-09-17 DIAGNOSIS — C20 Malignant neoplasm of rectum: Secondary | ICD-10-CM

## 2016-09-17 MED ORDER — HYDROCODONE-ACETAMINOPHEN 7.5-325 MG PO TABS: 1.0000 | ORAL_TABLET | Freq: Two times a day (BID) | ORAL | 0 refills | Status: DC | PRN

## 2016-09-17 MED ORDER — TRAMADOL HCL 50 MG PO TABS: ORAL_TABLET | ORAL | 0 refills | Status: DC

## 2016-10-15 ENCOUNTER — Telehealth (HOSPITAL_COMMUNITY): Payer: Self-pay | Admitting: *Deleted

## 2016-10-15 ENCOUNTER — Other Ambulatory Visit (HOSPITAL_COMMUNITY): Payer: Self-pay | Admitting: *Deleted

## 2016-10-15 ENCOUNTER — Encounter (HOSPITAL_COMMUNITY): Payer: Commercial Managed Care - HMO | Attending: Hematology & Oncology

## 2016-10-15 VITALS — BP 134/84 | HR 94 | Temp 97.9°F | Resp 18

## 2016-10-15 DIAGNOSIS — C20 Malignant neoplasm of rectum: Secondary | ICD-10-CM | POA: Insufficient documentation

## 2016-10-15 DIAGNOSIS — Z452 Encounter for adjustment and management of vascular access device: Secondary | ICD-10-CM | POA: Diagnosis not present

## 2016-10-15 DIAGNOSIS — Z95828 Presence of other vascular implants and grafts: Secondary | ICD-10-CM

## 2016-10-15 MED ORDER — HEPARIN SOD (PORK) LOCK FLUSH 100 UNIT/ML IV SOLN
INTRAVENOUS | Status: AC
  Filled 2016-10-15: qty 5

## 2016-10-15 MED ORDER — SODIUM CHLORIDE 0.9% FLUSH
10.0000 mL | INTRAVENOUS | Status: DC | PRN
  Administered 2016-10-15: 10 mL via INTRAVENOUS
  Filled 2016-10-15: qty 10

## 2016-10-15 MED ORDER — HEPARIN SOD (PORK) LOCK FLUSH 100 UNIT/ML IV SOLN
500.0000 [IU] | Freq: Once | INTRAVENOUS | Status: AC
  Administered 2016-10-15: 500 [IU] via INTRAVENOUS

## 2016-10-15 MED ORDER — TRAMADOL HCL 50 MG PO TABS: ORAL_TABLET | ORAL | 0 refills | Status: DC

## 2016-10-15 MED ORDER — HYDROCODONE-ACETAMINOPHEN 7.5-325 MG PO TABS: 1.0000 | ORAL_TABLET | Freq: Two times a day (BID) | ORAL | 0 refills | Status: DC | PRN

## 2016-10-15 NOTE — Progress Notes (Signed)
Pollyann Samples tolerated port flush well without complaints or incident. Port accessed with 20 gauge needle with good blood return noted. Port flushed with 10 ml NS and 5 ml Heparin easily per protocol then de-accessed. VSS Pt discharged self ambulatory using cane in satisfactory condition accompanied by family members

## 2016-10-15 NOTE — Patient Instructions (Signed)
[facility] at [facility] Discharge Instructions  RECOMMENDATIONS MADE BY THE CONSULTANT AND ANY TEST RESULTS WILL BE SENT TO YOUR REFERRING PHYSICIAN.  Portacath flushed per protocol today. Follow-up as scheduled. Call clinic for any questions or concerns  Thank you for choosing [facility] at [facility] to provide your oncology and hematology care.  To afford each patient quality time with our provider, please arrive at least 15 minutes before your scheduled appointment time.   Beginning January 23rd 2017 lab work for the Cancer Center will be done in the  Main lab at [facility] on 1st floor. If you have a lab appointment with the Cancer Center please come in thru the  Main Entrance and check in at the main information desk  You need to re-schedule your appointment should you arrive 10 or more minutes late.  We strive to give you quality time with our providers, and arriving late affects you and other patients whose appointments are after yours.  Also, if you no show three or more times for appointments you may be dismissed from the clinic at the providers discretion.     Again, thank you for choosing [facility].  Our hope is that these requests will decrease the amount of time that you wait before being seen by our physicians.       _____________________________________________________________  Should you have questions after your visit to [facility], please contact our office at [phone] between the hours of 8:30 a.m. and 4:30 p.m.  Voicemails left after 4:30 p.m. will not be returned until the following business day.  For prescription refill requests, have your pharmacy contact our office.         Resources For Cancer Patients and their Caregivers ? American Cancer Society: Can assist with transportation, wigs, general needs, runs Look Good Feel Better.        1-888-227-6333 ? Cancer Care: Provides  financial assistance, online support groups, medication/co-pay assistance.  1-800-813-HOPE (4673) ? [facility] Assists [place] cancer patients and their families through emotional , educational and financial support.  336-427-4357 ? [place] DSS Where to apply for food stamps, Medicaid and utility assistance. 336-342-1394 ? [facility]: Transportation to medical appointments. 336-347-2287 ? Social Security Administration: May apply for disability if have a Stage IV cancer. 336-342-7796 1-800-772-1213 ? [place] Aging, Disability and Transit Services: Assists with nutrition, care and transit needs. 336-349-2343  Cancer Center Support Programs: @10RELATIVEDAYS@ > Cancer Support Group  2nd Tuesday of the month 1pm-2pm, Journey Room  > Creative Journey  3rd Tuesday of the month 1130am-1pm, Journey Room  > Look Good Feel Better  1st Wednesday of the month 10am-12 noon, Journey Room (Call American Cancer Society to register 1-800-395-5775)   

## 2016-10-16 DIAGNOSIS — G4733 Obstructive sleep apnea (adult) (pediatric): Secondary | ICD-10-CM | POA: Diagnosis not present

## 2016-11-02 ENCOUNTER — Emergency Department (HOSPITAL_COMMUNITY): Payer: Commercial Managed Care - HMO

## 2016-11-02 ENCOUNTER — Emergency Department (HOSPITAL_COMMUNITY)
Admission: EM | Admit: 2016-11-02 | Discharge: 2016-11-02 | Disposition: A | Discharge status: Home / Self Care | Payer: Commercial Managed Care - HMO | Attending: Emergency Medicine | Admitting: Emergency Medicine

## 2016-11-02 ENCOUNTER — Encounter (HOSPITAL_COMMUNITY): Payer: Self-pay | Admitting: Emergency Medicine

## 2016-11-02 DIAGNOSIS — Z85048 Personal history of other malignant neoplasm of rectum, rectosigmoid junction, and anus: Secondary | ICD-10-CM | POA: Insufficient documentation

## 2016-11-02 DIAGNOSIS — E119 Type 2 diabetes mellitus without complications: Secondary | ICD-10-CM | POA: Insufficient documentation

## 2016-11-02 DIAGNOSIS — N390 Urinary tract infection, site not specified: Secondary | ICD-10-CM | POA: Diagnosis not present

## 2016-11-02 DIAGNOSIS — Z87891 Personal history of nicotine dependence: Secondary | ICD-10-CM | POA: Diagnosis not present

## 2016-11-02 DIAGNOSIS — I10 Essential (primary) hypertension: Secondary | ICD-10-CM | POA: Insufficient documentation

## 2016-11-02 DIAGNOSIS — Z79899 Other long term (current) drug therapy: Secondary | ICD-10-CM | POA: Diagnosis not present

## 2016-11-02 DIAGNOSIS — R350 Frequency of micturition: Secondary | ICD-10-CM | POA: Diagnosis present

## 2016-11-02 DIAGNOSIS — R300 Dysuria: Secondary | ICD-10-CM | POA: Diagnosis not present

## 2016-11-02 LAB — CBC WITH DIFFERENTIAL/PLATELET
BASOS ABS: 0 10*3/uL (ref 0.0–0.1)
Basophils Relative: 0 %
Eosinophils Absolute: 0.1 10*3/uL (ref 0.0–0.7)
Eosinophils Relative: 1 %
HCT: 35.2 % — ABNORMAL LOW (ref 39.0–52.0)
HEMOGLOBIN: 11.5 g/dL — AB (ref 13.0–17.0)
LYMPHS ABS: 0.9 10*3/uL (ref 0.7–4.0)
LYMPHS PCT: 8 %
MCH: 29.9 pg (ref 26.0–34.0)
MCHC: 32.7 g/dL (ref 30.0–36.0)
MCV: 91.7 fL (ref 78.0–100.0)
Monocytes Absolute: 1.2 10*3/uL — ABNORMAL HIGH (ref 0.1–1.0)
Monocytes Relative: 11 %
NEUTROS PCT: 80 %
Neutro Abs: 8.9 10*3/uL — ABNORMAL HIGH (ref 1.7–7.7)
Platelets: 132 10*3/uL — ABNORMAL LOW (ref 150–400)
RBC: 3.84 MIL/uL — AB (ref 4.22–5.81)
RDW: 13 % (ref 11.5–15.5)
WBC: 11.2 10*3/uL — AB (ref 4.0–10.5)

## 2016-11-02 LAB — URINALYSIS, ROUTINE W REFLEX MICROSCOPIC
GLUCOSE, UA: NEGATIVE mg/dL
KETONES UR: 15 mg/dL — AB
Nitrite: POSITIVE — AB
Specific Gravity, Urine: 1.025 (ref 1.005–1.030)
pH: 6 (ref 5.0–8.0)

## 2016-11-02 LAB — COMPREHENSIVE METABOLIC PANEL
ALBUMIN: 3.9 g/dL (ref 3.5–5.0)
ALT: 26 U/L (ref 17–63)
AST: 23 U/L (ref 15–41)
Alkaline Phosphatase: 59 U/L (ref 38–126)
Anion gap: 7 (ref 5–15)
BILIRUBIN TOTAL: 0.8 mg/dL (ref 0.3–1.2)
BUN: 15 mg/dL (ref 6–20)
CHLORIDE: 104 mmol/L (ref 101–111)
CO2: 24 mmol/L (ref 22–32)
CREATININE: 1.26 mg/dL — AB (ref 0.61–1.24)
Calcium: 8.3 mg/dL — ABNORMAL LOW (ref 8.9–10.3)
GFR calc Af Amer: >60 mL/min (ref >60)
GLUCOSE: 78 mg/dL (ref 65–99)
Potassium: 4.4 mmol/L (ref 3.5–5.1)
Sodium: 135 mmol/L (ref 135–145)
Total Protein: 7.4 g/dL (ref 6.5–8.1)

## 2016-11-02 LAB — URINALYSIS, MICROSCOPIC (REFLEX)

## 2016-11-02 LAB — LACTIC ACID, PLASMA: LACTIC ACID, VENOUS: 0.7 mmol/L (ref 0.5–1.9)

## 2016-11-02 MED ORDER — CEFTRIAXONE SODIUM 1 G IJ SOLR
INTRAMUSCULAR | Status: AC
  Filled 2016-11-02: qty 10

## 2016-11-02 MED ORDER — SODIUM CHLORIDE 0.9 % IV BOLUS (SEPSIS)
1000.0000 mL | Freq: Once | INTRAVENOUS | Status: AC
  Administered 2016-11-02: 1000 mL via INTRAVENOUS

## 2016-11-02 MED ORDER — CEPHALEXIN 500 MG PO CAPS: 500.0000 mg | ORAL_CAPSULE | Freq: Four times a day (QID) | ORAL | 0 refills | Status: DC

## 2016-11-02 MED ORDER — SODIUM CHLORIDE 0.9 % IV BOLUS (SEPSIS)
500.0000 mL | Freq: Once | INTRAVENOUS | Status: AC
  Administered 2016-11-02: 500 mL via INTRAVENOUS

## 2016-11-02 MED ORDER — DEXTROSE 5 % IV SOLN
1.0000 g | Freq: Once | INTRAVENOUS | Status: AC
  Administered 2016-11-02: 1 g via INTRAVENOUS
  Filled 2016-11-02: qty 10

## 2016-11-02 NOTE — ED Provider Notes (Signed)
Pt checked out to me by Kem Parkinson, PA-C pending labs and CT imaging.  Results suggesting acute cystitis.  Pt sent home with keflex prescription.  Advised increased fluid intake.  Discussed elevated creatinine level and need for recheck labs early next week by his pcp to ensure this is improving.  He understands and agrees with this plan.  Family at bedside also agrees. He felt better at time of dc.  Advised recheck here over the weekend for any worsened sx.    Evalee Jefferson, PA-C 11/02/16 [place], DO 11/05/16 [phone]

## 2016-11-02 NOTE — ED Provider Notes (Signed)
[facility] DEPT Provider Note   CSN: [id number] Arrival date & time: 11/02/16  1245     History   Chief Complaint Chief Complaint  Patient presents with  . Urinary Frequency    HPI ADRION STICKROD is a 55 y.o. male.  HPI  PLUMER NEIN is a 55 y.o. male with hx of rectal cancer not currently taking chemo or radiation, who presents to the Emergency Department complaining of urinary frequency and hesitancy since last evening.  He describes a burning sensation when voiding and having difficulty urinated last evening.  Today, he reports increased frequency but only urinating small amounts.  He also complains of lower back pain, but has hx of same and states pain is similar to previous.  He denies fever, chills, abdominal pain, vomiting.  Also denies penile discharge, testicular pain or swelling.  Last unprotected sexual encounter was two months ago.      Past Medical History:  Diagnosis Date  . Arthritis   . Cancer ([place])    rectal  . Depression   . Diabetes mellitus    "Borderline"  . GERD (gastroesophageal reflux disease)    No weakness  . High cholesterol   . Hypertension   . Neuropathy ([place])    "back missed up"  . Rectal cancer ([place])   . Sleep apnea     Patient Active Problem List   Diagnosis Date Noted  . History of colorectal cancer   . History of rectal cancer 05/09/2016  . HTN (hypertension) 11/30/2015  . Rectal cancer ([place]) 07/06/2015  . History of colonic polyps   . Diverticulosis of colon without hemorrhage   . Taking medication for chronic disease 03/24/2015  . Encounter for screening colonoscopy 03/24/2015    Past Surgical History:  Procedure Laterality Date  . BACK SURGERY    . BIOPSY  03/31/2015   Procedure: BIOPSY;  Surgeon: Daneil Dolin, MD;  [facility]: [facility];  Service: Endoscopy;;  . CHOLECYSTECTOMY    . COLONOSCOPY WITH PROPOFOL N/A 03/31/2015   Procedure: COLONOSCOPY WITH PROPOFOL at cecum 0842; withdrawal time=30minutes;  Surgeon: Daneil Dolin, MD;  Location: [facility];  Service: Endoscopy;  Laterality: N/A;  . COLONOSCOPY WITH PROPOFOL N/A 05/21/2016   Procedure: COLONOSCOPY WITH PROPOFOL;  Surgeon: Daneil Dolin, MD;  [facility];  Service: Endoscopy;  Laterality: N/A;  200 - moved to 12:45 - office calling pt with 11:15 arrival time  . COLONOSCOPY WITH PROPOFOL N/A 07/26/2016   Procedure: COLONOSCOPY WITH PROPOFOL;  Surgeon: Daneil Dolin, MD;  [facility];  Service: Endoscopy;  Laterality: N/A;  115  . FLEXIBLE SIGMOIDOSCOPY N/A 07/05/2015   Procedure: FLEXIBLE SIGMOIDOSCOPY;  Surgeon: [name], MD;  [facility];  Service: Endoscopy;  Laterality: N/A;  with tattoo  . POLYPECTOMY  03/31/2015   Procedure: POLYPECTOMY;  Surgeon: Daneil Dolin, MD;  Location: [facility];  Service: Endoscopy;;  . PORTACATH PLACEMENT N/A 08/09/2015   Procedure: INSERTION PORT-A-CATH LEFT SUBCLAVIAN;  Surgeon: [name], MD;  Location: [facility];  Service: General;  Laterality: N/A;  . XI ROBOTIC ASSISTED LOWER ANTERIOR RESECTION N/A 07/06/2015   Procedure: XI ROBOTIC ASSISTED LOWER ANTERIOR RESECTION, rigid proctoscopy;  Surgeon: [name], MD;  [facility];  Service: General;  Laterality: N/A;       Home Medications    Prior to Admission medications   Medication Sig Start Date End Date Taking? Authorizing Provider  amLODipine (NORVASC) 5 MG tablet Take 1  tablet (5 mg total) by mouth every morning. 11/30/15  Yes Patrici Ranks, MD  gabapentin (NEURONTIN) 800 MG tablet Take 1 tablet (800 mg total) by mouth 3 (three) times daily. 11/30/15  Yes Patrici Ranks, MD  HYDROcodone-acetaminophen (NORCO) 7.5-325 MG tablet Take 1 tablet by mouth every 12 (twelve) hours as needed for moderate pain. 10/15/16  Yes Manon Hilding Kefalas, PA-C  lisinopril (PRINIVIL,ZESTRIL) 40 MG tablet Take 1 tablet (40 mg total) by mouth every morning. 11/30/15  Yes Patrici Ranks, MD  NON FORMULARY PT HAS A C-PAP MACHINE   Yes  Historical Provider, MD  nortriptyline (PAMELOR) 75 MG capsule Take 1 capsule (75 mg total) by mouth at bedtime. 11/30/15  Yes Patrici Ranks, MD  tiZANidine (ZANAFLEX) 2 MG tablet Take 1 tablet (2 mg total) by mouth every 8 (eight) hours as needed for muscle spasms. 11/30/15  Yes Patrici Ranks, MD  traMADol Veatrice Bourbon) 50 MG tablet May take 1-2 tabs every 6 hours as needed for pain 10/15/16  Yes Manon Hilding Kefalas, PA-C  polyethylene glycol-electrolytes (TRILYTE) 420 g solution Take 4,000 mLs by mouth as directed. 06/27/16   Daneil Dolin, MD    Family History Family History  Problem Relation Age of Onset  . Hypertension      "Not sure who, I don't know much about my family"  . Diabetes      "Not sure who, I don't know much about my family"  . Colon cancer Neg Hx     "Not that I know of"  . Colon polyps Neg Hx     "not that I know of"    Social History Social History  Substance Use Topics  . Smoking status: Former Smoker    Packs/day: 0.25    Years: 2.00    Types: Cigarettes    Quit date: 03/23/2006  . Smokeless tobacco: Never Used  . Alcohol use No     Allergies   Patient has no known allergies.   Review of Systems Review of Systems  Constitutional: Negative for activity change, appetite change, chills and fever.  Respiratory: Negative for chest tightness and shortness of breath.   Gastrointestinal: Negative for abdominal pain, nausea and vomiting.  Genitourinary: Positive for decreased urine volume, dysuria, frequency and urgency. Negative for difficulty urinating, discharge, flank pain, hematuria, penile pain, penile swelling, scrotal swelling and testicular pain.  Musculoskeletal: Positive for back pain.  Skin: Negative for rash.  Neurological: Negative for dizziness, weakness and numbness.  Hematological: Negative for adenopathy.  Psychiatric/Behavioral: Negative for confusion.  All other systems reviewed and are negative.    Physical Exam Updated Vital  Signs BP 114/69 (BP Location: Left Arm)   Pulse 111   Temp 99.6 F (37.6 C) (Oral)   Resp 18   Ht 5\' 2"  (1.575 m)   Wt 99.8 kg   SpO2 95%   BMI 40.24 kg/m   Physical Exam  Constitutional: He is oriented to person, place, and time. He appears well-developed and well-nourished. No distress.  HENT:  Head: Normocephalic and atraumatic.  Mouth/Throat: Oropharynx is clear and moist.  Cardiovascular: Normal rate, regular rhythm and intact distal pulses.   Pulmonary/Chest: Effort normal and breath sounds normal. No respiratory distress. He has no wheezes. He has no rales.  Abdominal: Soft. Normal appearance. He exhibits no distension and no mass. There is no hepatosplenomegaly. There is no tenderness. There is no rigidity, no rebound, no guarding, no CVA tenderness and no tenderness at McBurney's  point.  No CVA tenderness  Musculoskeletal: Normal range of motion. He exhibits no edema.  No spinal tenderness.    Neurological: He is alert and oriented to person, place, and time. Coordination normal.  Skin: Skin is warm and dry. No rash noted.  Psychiatric: He has a normal mood and affect.  Nursing note and vitals reviewed.    ED Treatments / Results  Labs (all labs ordered are listed, but only abnormal results are displayed) Labs Reviewed  URINALYSIS, ROUTINE W REFLEX MICROSCOPIC - Abnormal; Notable for the following:       Result Value   APPearance TURBID (*)    Hgb urine dipstick LARGE (*)    Bilirubin Urine MODERATE (*)    Ketones, ur 15 (*)    Protein, ur >300 (*)    Nitrite POSITIVE (*)    Leukocytes, UA MODERATE (*)    All other components within normal limits  URINALYSIS, MICROSCOPIC (REFLEX) - Abnormal; Notable for the following:    Bacteria, UA MANY (*)    Squamous Epithelial / LPF 0-5 (*)    All other components within normal limits  URINE CULTURE    EKG  EKG Interpretation None       Radiology No results found.  Procedures Procedures (including critical  care time)  Medications Ordered in ED Medications  sodium chloride 0.9 % bolus 1,000 mL (1,000 mLs Intravenous New Bag/Given 11/02/16 1459)  cefTRIAXone (ROCEPHIN) 1 g in dextrose 5 % 50 mL IVPB (1 g Intravenous New Bag/Given 11/02/16 1459)  cefTRIAXone (ROCEPHIN) 1 g injection (  Return to [facility] 11/02/16 1500)     Initial Impression / Assessment and Plan / ED Course  I have reviewed the triage vital signs and the nursing notes.  Pertinent labs & imaging results that were available during my care of the patient were reviewed by me and considered in my medical decision making (see chart for details).  Clinical Course    1450  Pt also seen by Dr. Lacinda Axon and care plan discussed.  It was determined that pt has likely UTI.  Doubt infected stone. Will give IVF's and IV Rocephin here.  Urine culture pending.    1655 pt remains tachycardic despite IVF's, BP soft.  Discussed with Dr. Thurnell Garbe.  I will order labs, CT stone study and lactic acid.  Pt is drinking fluids, non-toxic appearing.   1712  End of shift, pt signed out to Evalee Jefferson, PA-C who agrees to assume care.     Final Clinical Impressions(s) / ED Diagnoses   Final diagnoses:  None    New Prescriptions New Prescriptions   No medications on file     Kem Parkinson, Hershal Coria 11/02/16 [place], MD 11/03/16 2002

## 2016-11-02 NOTE — Discharge Instructions (Signed)
Drink plenty of water.  Take the antibiotic as directed until its finished.  Return here for any worsening symptoms such as fever, vomiting, abdominal or back pain

## 2016-11-02 NOTE — ED Triage Notes (Signed)
Having urinary frequency since last night.  C/o of burning and back pain, rates pain 9/10.

## 2016-11-06 ENCOUNTER — Other Ambulatory Visit (HOSPITAL_COMMUNITY): Payer: Self-pay | Admitting: *Deleted

## 2016-11-06 LAB — URINE CULTURE

## 2016-11-06 MED ORDER — TRAMADOL HCL 50 MG PO TABS: ORAL_TABLET | ORAL | 0 refills | Status: DC

## 2016-11-06 MED ORDER — HYDROCODONE-ACETAMINOPHEN 7.5-325 MG PO TABS: 1.0000 | ORAL_TABLET | Freq: Two times a day (BID) | ORAL | 0 refills | Status: DC | PRN

## 2016-11-06 NOTE — Telephone Encounter (Signed)
LMOM that Rx's will be at front desk.

## 2016-11-07 ENCOUNTER — Telehealth (HOSPITAL_BASED_OUTPATIENT_CLINIC_OR_DEPARTMENT_OTHER): Payer: Self-pay

## 2016-11-07 NOTE — Telephone Encounter (Signed)
Post ED Visit - Positive Culture Follow-up  Culture report reviewed by antimicrobial stewardship pharmacist:  []  Elenor Quinones, Pharm.D. []  Heide Guile, Pharm.D., BCPS [x]  Parks Neptune, Pharm.D. []  Alycia Rossetti, Pharm.D., BCPS []  [place], [place].D., BCPS, AAHIVP []  Legrand Como, Pharm.D., BCPS, AAHIVP []  Milus Glazier, Pharm.D. []  Stephens November, Pharm.D.  Positive urine culture -> >/= 100,000 colonies -> E Coli Treated with Cephalexin, organism sensitive to the same and no further patient follow-up is required at this time.  Dortha Kern 11/07/2016, 9:16 AM

## 2016-11-20 DIAGNOSIS — C20 Malignant neoplasm of rectum: Secondary | ICD-10-CM | POA: Diagnosis not present

## 2016-11-20 DIAGNOSIS — N390 Urinary tract infection, site not specified: Secondary | ICD-10-CM | POA: Diagnosis not present

## 2016-11-20 DIAGNOSIS — E1165 Type 2 diabetes mellitus with hyperglycemia: Secondary | ICD-10-CM | POA: Diagnosis not present

## 2016-11-20 DIAGNOSIS — I10 Essential (primary) hypertension: Secondary | ICD-10-CM | POA: Diagnosis not present

## 2016-11-24 ENCOUNTER — Emergency Department (HOSPITAL_COMMUNITY)
Admission: EM | Admit: 2016-11-24 | Discharge: 2016-11-24 | Disposition: A | Discharge status: Home / Self Care | Payer: Medicare HMO | Attending: Emergency Medicine | Admitting: Emergency Medicine

## 2016-11-24 ENCOUNTER — Encounter (HOSPITAL_COMMUNITY): Payer: Self-pay

## 2016-11-24 DIAGNOSIS — Z87891 Personal history of nicotine dependence: Secondary | ICD-10-CM | POA: Diagnosis not present

## 2016-11-24 DIAGNOSIS — Z79899 Other long term (current) drug therapy: Secondary | ICD-10-CM | POA: Insufficient documentation

## 2016-11-24 DIAGNOSIS — R319 Hematuria, unspecified: Secondary | ICD-10-CM | POA: Diagnosis present

## 2016-11-24 DIAGNOSIS — R310 Gross hematuria: Secondary | ICD-10-CM | POA: Diagnosis not present

## 2016-11-24 DIAGNOSIS — Z7984 Long term (current) use of oral hypoglycemic drugs: Secondary | ICD-10-CM | POA: Diagnosis not present

## 2016-11-24 DIAGNOSIS — E119 Type 2 diabetes mellitus without complications: Secondary | ICD-10-CM | POA: Insufficient documentation

## 2016-11-24 DIAGNOSIS — I10 Essential (primary) hypertension: Secondary | ICD-10-CM | POA: Diagnosis not present

## 2016-11-24 LAB — BASIC METABOLIC PANEL
Anion gap: 5 (ref 5–15)
BUN: 11 mg/dL (ref 6–20)
CALCIUM: 9.1 mg/dL (ref 8.9–10.3)
CO2: 27 mmol/L (ref 22–32)
CREATININE: 1.07 mg/dL (ref 0.61–1.24)
Chloride: 103 mmol/L (ref 101–111)
GFR calc non Af Amer: >60 mL/min (ref >60)
Glucose, Bld: 100 mg/dL — ABNORMAL HIGH (ref 65–99)
Potassium: 4.1 mmol/L (ref 3.5–5.1)
Sodium: 135 mmol/L (ref 135–145)

## 2016-11-24 LAB — CBC WITH DIFFERENTIAL/PLATELET
BASOS PCT: 1 %
Basophils Absolute: 0 10*3/uL (ref 0.0–0.1)
EOS ABS: 0.5 10*3/uL (ref 0.0–0.7)
Eosinophils Relative: 16 %
HCT: 38.4 % — ABNORMAL LOW (ref 39.0–52.0)
HEMOGLOBIN: 12.6 g/dL — AB (ref 13.0–17.0)
Lymphocytes Relative: 24 %
Lymphs Abs: 0.7 10*3/uL (ref 0.7–4.0)
MCH: 29.8 pg (ref 26.0–34.0)
MCHC: 32.8 g/dL (ref 30.0–36.0)
MCV: 90.8 fL (ref 78.0–100.0)
MONOS PCT: 13 %
Monocytes Absolute: 0.4 10*3/uL (ref 0.1–1.0)
NEUTROS PCT: 46 %
Neutro Abs: 1.3 10*3/uL — ABNORMAL LOW (ref 1.7–7.7)
PLATELETS: 170 10*3/uL (ref 150–400)
RBC: 4.23 MIL/uL (ref 4.22–5.81)
RDW: 12.5 % (ref 11.5–15.5)
WBC: 2.8 10*3/uL — AB (ref 4.0–10.5)

## 2016-11-24 LAB — URINALYSIS, ROUTINE W REFLEX MICROSCOPIC
BILIRUBIN URINE: NEGATIVE
Bacteria, UA: NONE SEEN
Glucose, UA: NEGATIVE mg/dL
KETONES UR: NEGATIVE mg/dL
LEUKOCYTES UA: NEGATIVE
NITRITE: NEGATIVE
Protein, ur: NEGATIVE mg/dL
Specific Gravity, Urine: 1.017 (ref 1.005–1.030)
pH: 5 (ref 5.0–8.0)

## 2016-11-24 NOTE — ED Triage Notes (Signed)
Pt reports had a UTI 2 weeks ago and approx 1 week ago started having hematuria.  Denies any pain.

## 2016-11-24 NOTE — Discharge Instructions (Signed)
There is no sign of infection in your urine today.  You need to see a urologist for further evaluation of the intermittent blood you are seeing in your urine.  Your kidney function is improved today!

## 2016-11-24 NOTE — ED Provider Notes (Signed)
[facility] DEPT Provider Note   CSN: [id number] Arrival date & time: 11/24/16  1022     History   Chief Complaint Chief Complaint  Patient presents with  . Hematuria    HPI [name] is a 56 y.o. male with has medical history as outlined below, most significant for a UTI for which he was seen here in mid December and has completed a course of Keflex for this infection, returning today with complaints of painless hematuria for the past week which has been intermittent stating he can see visible blood with multiple back-to-back urinations, then will have a normal-appearing urine.  He denies any pain including abdominal pain, distention back pain or flank pain.  He also denies increased urinary frequency or painful urination.  He was seen by his PCP is past week at which point his urine test was okay.  He did not have blood work drawn at this visit.  Past medical history significant for rectal cancer for which he underwent surgical intervention August 2016 with a residual metastatic lymph node so underwent chemotherapy and radiation treatment last treated 3/17, now with yearly rechecks scheduled.  He denies bloody stools or rectal pain and also denies abdomen, back or flank pain.  He has found no alleviators.  The history is provided by the patient.    Past Medical History:  Diagnosis Date  . Arthritis   . Cancer ([place])    rectal  . Depression   . Diabetes mellitus    "Borderline"  . GERD (gastroesophageal reflux disease)    No weakness  . High cholesterol   . Hypertension   . Neuropathy ([place])    "back missed up"  . Rectal cancer ([place])   . Sleep apnea     Patient Active Problem List   Diagnosis Date Noted  . History of colorectal cancer   . History of rectal cancer 05/09/2016  . HTN (hypertension) 11/30/2015  . Rectal cancer ([place]) 07/06/2015  . History of colonic polyps   . Diverticulosis of colon without hemorrhage   . Taking medication for chronic disease  03/24/2015  . Encounter for screening colonoscopy 03/24/2015    Past Surgical History:  Procedure Laterality Date  . BACK SURGERY    . BIOPSY  03/31/2015   Procedure: BIOPSY;  Surgeon: Daneil Dolin, MD;  [facility];  Service: Endoscopy;;  . CHOLECYSTECTOMY    . COLONOSCOPY WITH PROPOFOL N/A 03/31/2015   Procedure: COLONOSCOPY WITH PROPOFOL at cecum 0842; withdrawal time=22minutes;  Surgeon: Daneil Dolin, MD;  [facility];  Service: Endoscopy;  Laterality: N/A;  . COLONOSCOPY WITH PROPOFOL N/A 05/21/2016   Procedure: COLONOSCOPY WITH PROPOFOL;  Surgeon: Daneil Dolin, MD;  [facility];  Service: Endoscopy;  Laterality: N/A;  200 - moved to 12:45 - office calling pt with 11:15 arrival time  . COLONOSCOPY WITH PROPOFOL N/A 07/26/2016   Procedure: COLONOSCOPY WITH PROPOFOL;  Surgeon: Daneil Dolin, MD;  [facility];  Service: Endoscopy;  Laterality: N/A;  115  . FLEXIBLE SIGMOIDOSCOPY N/A 07/05/2015   Procedure: FLEXIBLE SIGMOIDOSCOPY;  Surgeon: [name], MD;  [facility];  Service: Endoscopy;  Laterality: N/A;  with tattoo  . POLYPECTOMY  03/31/2015   Procedure: POLYPECTOMY;  Surgeon: Daneil Dolin, MD;  [facility];  Service: Endoscopy;;  . PORTACATH PLACEMENT N/A 08/09/2015   Procedure: INSERTION PORT-A-CATH LEFT SUBCLAVIAN;  Surgeon: [name], MD;  Location: [facility];  Service: General;  Laterality: N/A;  .  XI ROBOTIC ASSISTED LOWER ANTERIOR RESECTION N/A 07/06/2015   Procedure: XI ROBOTIC ASSISTED LOWER ANTERIOR RESECTION, rigid proctoscopy;  Surgeon: [name], MD;  [facility];  Service: General;  Laterality: N/A;       Home Medications    Prior to Admission medications   Medication Sig Start Date End Date Taking? Authorizing Provider  amLODipine (NORVASC) 5 MG tablet Take 1 tablet (5 mg total) by mouth every morning. 11/30/15  Yes Patrici Ranks, MD  gabapentin (NEURONTIN) 800 MG tablet Take 1 tablet (800 mg  total) by mouth 3 (three) times daily. 11/30/15  Yes Patrici Ranks, MD  HYDROcodone-acetaminophen (NORCO) 7.5-325 MG tablet Take 1 tablet by mouth every 12 (twelve) hours as needed for moderate pain. 11/06/16  Yes Manon Hilding Kefalas, PA-C  lisinopril (PRINIVIL,ZESTRIL) 40 MG tablet Take 1 tablet (40 mg total) by mouth every morning. 11/30/15  Yes Patrici Ranks, MD  NON FORMULARY PT HAS A C-PAP MACHINE   Yes Historical Provider, MD  nortriptyline (PAMELOR) 75 MG capsule Take 1 capsule (75 mg total) by mouth at bedtime. 11/30/15  Yes Patrici Ranks, MD  tiZANidine (ZANAFLEX) 2 MG tablet Take 1 tablet (2 mg total) by mouth every 8 (eight) hours as needed for muscle spasms. 11/30/15  Yes Patrici Ranks, MD  traMADol Veatrice Bourbon) 50 MG tablet May take 1-2 tabs every 6 hours as needed for pain 11/06/16  Yes Manon Hilding Kefalas, PA-C  cephALEXin (KEFLEX) 500 MG capsule Take 1 capsule (500 mg total) by mouth 4 (four) times daily. For 7 days Patient not taking: Reported on 11/24/2016 11/02/16   Tammy Triplett, PA-C  polyethylene glycol-electrolytes (TRILYTE) 420 g solution Take 4,000 mLs by mouth as directed. Patient not taking: Reported on 11/24/2016 06/27/16   Daneil Dolin, MD    Family History Family History  Problem Relation Age of Onset  . Hypertension      "Not sure who, I don't know much about my family"  . Diabetes      "Not sure who, I don't know much about my family"  . Colon cancer Neg Hx     "Not that I know of"  . Colon polyps Neg Hx     "not that I know of"    Social History Social History  Substance Use Topics  . Smoking status: Former Smoker    Packs/day: 0.25    Years: 2.00    Types: Cigarettes    Quit date: 03/23/2006  . Smokeless tobacco: Never Used  . Alcohol use No     Allergies   Patient has no known allergies.   Review of Systems Review of Systems  Constitutional: Negative for fever.  HENT: Negative for congestion and sore throat.   Eyes: Negative.     Respiratory: Negative for chest tightness and shortness of breath.   Cardiovascular: Negative for chest pain.  Gastrointestinal: Negative for abdominal pain and nausea.  Genitourinary: Positive for hematuria. Negative for difficulty urinating, discharge, dysuria, flank pain, frequency, penile pain, testicular pain and urgency.  Musculoskeletal: Negative for arthralgias, joint swelling and neck pain.  Skin: Negative.  Negative for rash and wound.  Neurological: Negative for dizziness, weakness, light-headedness, numbness and headaches.  Psychiatric/Behavioral: Negative.      Physical Exam Updated Vital Signs BP 127/86 (BP Location: Left Arm)   Pulse 70   Temp 97.7 F (36.5 C) (Oral)   Resp 20   Ht 5\' 2"  (1.575 m)   Wt 90.7 kg  SpO2 98%   BMI 36.58 kg/m   Physical Exam  Constitutional: He appears well-developed and well-nourished.  HENT:  Head: Normocephalic and atraumatic.  Eyes: Conjunctivae are normal.  Neck: Normal range of motion.  Cardiovascular: Normal rate, regular rhythm, normal heart sounds and intact distal pulses.   Pulmonary/Chest: Effort normal and breath sounds normal. He has no wheezes.  Abdominal: Soft. Bowel sounds are normal. There is no tenderness.  Musculoskeletal: Normal range of motion.  Neurological: He is alert.  Skin: Skin is warm and dry.  Psychiatric: He has a normal mood and affect.  Nursing note and vitals reviewed.    ED Treatments / Results  Labs (all labs ordered are listed, but only abnormal results are displayed) Labs Reviewed  URINALYSIS, ROUTINE W REFLEX MICROSCOPIC - Abnormal; Notable for the following:       Result Value   Hgb urine dipstick SMALL (*)    All other components within normal limits  BASIC METABOLIC PANEL - Abnormal; Notable for the following:    Glucose, Bld 100 (*)    All other components within normal limits  CBC WITH DIFFERENTIAL/PLATELET - Abnormal; Notable for the following:    WBC 2.8 (*)    Hemoglobin  12.6 (*)    HCT 38.4 (*)    Neutro Abs 1.3 (*)    All other components within normal limits    EKG  EKG Interpretation None       Radiology No results found.  Procedures Procedures (including critical care time)  Medications Ordered in ED Medications - No data to display   Initial Impression / Assessment and Plan / ED Course  I have reviewed the triage vital signs and the nursing notes.  Pertinent labs & imaging results that were available during my care of the patient were reviewed by me and considered in my medical decision making (see chart for details).  Clinical Course     Labs reviewed and discussed with pt.  No infection today, hemoglobin in urine without rbc's on todays sample.  Doubt rhabdo as pt has no myalgias, muscle pain or weakness.  He was reassured that his labs are stable today but he will need close f/u with urology.  Referral given.  Pt will call for appt.  Prn f/u anticipated here.  Final Clinical Impressions(s) / ED Diagnoses   Final diagnoses:  Gross hematuria    New Prescriptions Discharge Medication List as of 11/24/2016  1:00 PM       Evalee Jefferson, PA-C 11/24/16 [place], MD 11/27/16 1557

## 2016-12-10 ENCOUNTER — Other Ambulatory Visit (HOSPITAL_COMMUNITY): Payer: Self-pay | Admitting: Oncology

## 2016-12-10 ENCOUNTER — Telehealth (HOSPITAL_COMMUNITY): Payer: Self-pay | Admitting: *Deleted

## 2016-12-10 DIAGNOSIS — C20 Malignant neoplasm of rectum: Secondary | ICD-10-CM

## 2016-12-10 MED ORDER — HYDROCODONE-ACETAMINOPHEN 7.5-325 MG PO TABS: 1.0000 | ORAL_TABLET | Freq: Two times a day (BID) | ORAL | 0 refills | Status: DC | PRN

## 2016-12-10 MED ORDER — TRAMADOL HCL 50 MG PO TABS: ORAL_TABLET | ORAL | 0 refills | Status: DC

## 2016-12-10 NOTE — Telephone Encounter (Signed)
Printed

## 2016-12-13 ENCOUNTER — Other Ambulatory Visit (HOSPITAL_COMMUNITY): Payer: Commercial Managed Care - HMO

## 2016-12-13 ENCOUNTER — Ambulatory Visit (HOSPITAL_COMMUNITY): Payer: Medicare HMO | Admitting: Adult Health

## 2016-12-13 ENCOUNTER — Encounter (HOSPITAL_COMMUNITY): Payer: Self-pay

## 2016-12-13 ENCOUNTER — Encounter (HOSPITAL_COMMUNITY): Payer: Medicare HMO | Attending: Hematology & Oncology

## 2016-12-13 DIAGNOSIS — Z452 Encounter for adjustment and management of vascular access device: Secondary | ICD-10-CM

## 2016-12-13 DIAGNOSIS — C20 Malignant neoplasm of rectum: Secondary | ICD-10-CM | POA: Diagnosis not present

## 2016-12-13 LAB — COMPREHENSIVE METABOLIC PANEL
ALT: 33 U/L (ref 17–63)
AST: 31 U/L (ref 15–41)
Albumin: 4 g/dL (ref 3.5–5.0)
Alkaline Phosphatase: 73 U/L (ref 38–126)
Anion gap: 7 (ref 5–15)
BILIRUBIN TOTAL: 0.5 mg/dL (ref 0.3–1.2)
BUN: 8 mg/dL (ref 6–20)
CALCIUM: 9.3 mg/dL (ref 8.9–10.3)
CO2: 26 mmol/L (ref 22–32)
CREATININE: 1.02 mg/dL (ref 0.61–1.24)
Chloride: 105 mmol/L (ref 101–111)
GFR calc Af Amer: >60 mL/min (ref >60)
Glucose, Bld: 112 mg/dL — ABNORMAL HIGH (ref 65–99)
Potassium: 3.7 mmol/L (ref 3.5–5.1)
Sodium: 138 mmol/L (ref 135–145)
TOTAL PROTEIN: 7.7 g/dL (ref 6.5–8.1)

## 2016-12-13 LAB — CBC WITH DIFFERENTIAL/PLATELET
BASOS PCT: 0 %
Basophils Absolute: 0 10*3/uL (ref 0.0–0.1)
EOS PCT: 11 %
Eosinophils Absolute: 0.3 10*3/uL (ref 0.0–0.7)
HCT: 36.9 % — ABNORMAL LOW (ref 39.0–52.0)
HEMOGLOBIN: 12.4 g/dL — AB (ref 13.0–17.0)
Lymphocytes Relative: 24 %
Lymphs Abs: 0.7 10*3/uL (ref 0.7–4.0)
MCH: 30 pg (ref 26.0–34.0)
MCHC: 33.6 g/dL (ref 30.0–36.0)
MCV: 89.1 fL (ref 78.0–100.0)
MONOS PCT: 8 %
Monocytes Absolute: 0.2 10*3/uL (ref 0.1–1.0)
NEUTROS PCT: 57 %
Neutro Abs: 1.7 10*3/uL (ref 1.7–7.7)
Platelets: 158 10*3/uL (ref 150–400)
RBC: 4.14 MIL/uL — ABNORMAL LOW (ref 4.22–5.81)
RDW: 12.5 % (ref 11.5–15.5)
WBC: 3 10*3/uL — AB (ref 4.0–10.5)

## 2016-12-13 MED ORDER — SODIUM CHLORIDE 0.9% FLUSH
10.0000 mL | INTRAVENOUS | Status: DC | PRN
  Administered 2016-12-13: 10 mL via INTRAVENOUS
  Filled 2016-12-13: qty 10

## 2016-12-13 MED ORDER — HEPARIN SOD (PORK) LOCK FLUSH 100 UNIT/ML IV SOLN
500.0000 [IU] | Freq: Once | INTRAVENOUS | Status: AC
  Administered 2016-12-13: 500 [IU] via INTRAVENOUS
  Filled 2016-12-13: qty 5

## 2016-12-13 NOTE — Patient Instructions (Signed)
[facility] at [facility] Discharge Instructions  RECOMMENDATIONS MADE BY THE CONSULTANT AND ANY TEST RESULTS WILL BE SENT TO YOUR REFERRING PHYSICIAN.  Port flush with labs Follow up as scheduled.  Thank you for choosing  Cancer Center at [facility] to provide your oncology and hematology care.  To afford each patient quality time with our provider, please arrive at least 15 minutes before your scheduled appointment time.    If you have a lab appointment with the Cancer Center please come in thru the  Main Entrance and check in at the main information desk  You need to re-schedule your appointment should you arrive 10 or more minutes late.  We strive to give you quality time with our providers, and arriving late affects you and other patients whose appointments are after yours.  Also, if you no show three or more times for appointments you may be dismissed from the clinic at the providers discretion.     Again, thank you for choosing [facility].  Our hope is that these requests will decrease the amount of time that you wait before being seen by our physicians.       _____________________________________________________________  Should you have questions after your visit to [facility], please contact our office at [phone] between the hours of 8:30 a.m. and 4:30 p.m.  Voicemails left after 4:30 p.m. will not be returned until the following business day.  For prescription refill requests, have your pharmacy contact our office.       Resources For Cancer Patients and their Caregivers ? American Cancer Society: Can assist with transportation, wigs, general needs, runs Look Good Feel Better.        1-888-227-6333 ? Cancer Care: Provides financial assistance, online support groups, medication/co-pay assistance.  1-800-813-HOPE (4673) ? [facility] Assists [place] cancer patients and  their families through emotional , educational and financial support.  336-427-4357 ? [place] DSS Where to apply for food stamps, Medicaid and utility assistance. 336-342-1394 ? [facility]: Transportation to medical appointments. 336-347-2287 ? Social Security Administration: May apply for disability if have a Stage IV cancer. 336-342-7796 1-800-772-1213 ? [place] Aging, Disability and Transit Services: Assists with nutrition, care and transit needs. 336-349-2343  Cancer Center Support Programs: @[email or web address]@ > Cancer Support Group  2nd Tuesday of the month 1pm-2pm, Journey Room  > Creative Journey  3rd Tuesday of the month 1130am-1pm, Journey Room  > Look Good Feel Better  1st Wednesday of the month 10am-12 noon, Journey Room (Call American Cancer Society to register 1-800-395-5775)   

## 2016-12-13 NOTE — Progress Notes (Signed)
[name] presented for Portacath access and flush. Portacath located left chest wall accessed with  H 20 needle. Good blood return present. Portacath flushed with 8ml NS and 500U/66ml Heparin and needle removed intact. Procedure without incident. Patient tolerated procedure well. Vitals stable and discharged from clinic ambulatory.

## 2016-12-14 LAB — CEA: CEA: 4.4 ng/mL (ref 0.0–4.7)

## 2016-12-24 ENCOUNTER — Other Ambulatory Visit (HOSPITAL_COMMUNITY): Payer: Medicare HMO

## 2016-12-24 ENCOUNTER — Encounter (HOSPITAL_COMMUNITY): Payer: Self-pay | Admitting: Adult Health

## 2016-12-24 ENCOUNTER — Encounter (HOSPITAL_COMMUNITY): Payer: Medicare HMO | Attending: Adult Health | Admitting: Adult Health

## 2016-12-24 VITALS — BP 129/87 | HR 89 | Temp 98.0°F | Resp 16 | Wt 208.3 lb

## 2016-12-24 DIAGNOSIS — R310 Gross hematuria: Secondary | ICD-10-CM

## 2016-12-24 DIAGNOSIS — C20 Malignant neoplasm of rectum: Secondary | ICD-10-CM

## 2016-12-24 NOTE — Patient Instructions (Addendum)
[place] at [facility] Discharge Instructions  RECOMMENDATIONS MADE BY THE CONSULTANT AND ANY TEST RESULTS WILL BE SENT TO YOUR REFERRING PHYSICIAN.  You saw Mike Craze, NP, today. CT in the couple weeks. Return in 6 months for follow up labs. See Amy at checkout for appointments.  Thank you for choosing [place] at [facility] to provide your oncology and hematology care.  To afford each patient quality time with our provider, please arrive at least 15 minutes before your scheduled appointment time.    If you have a lab appointment with the [place] please come in thru the  Main Entrance and check in at the main information desk  You need to re-schedule your appointment should you arrive 10 or more minutes late.  We strive to give you quality time with our providers, and arriving late affects you and other patients whose appointments are after yours.  Also, if you no show three or more times for appointments you may be dismissed from the clinic at the providers discretion.     Again, thank you for choosing [facility].  Our hope is that these requests will decrease the amount of time that you wait before being seen by our physicians.       _____________________________________________________________  Should you have questions after your visit to [facility], please contact our office at [phone] between the hours of 8:30 a.m. and 4:30 p.m.  Voicemails left after 4:30 p.m. will not be returned until the following business day.  For prescription refill requests, have your pharmacy contact our office.       Resources For Cancer Patients and their Caregivers ? American Cancer Society: Can assist with transportation, wigs, general needs, runs Look Good Feel Better.        262-052-5190 ? Cancer Care: Provides financial assistance, online support groups, medication/co-pay assistance.   1-800-813-HOPE (662)402-0132) ? [place] Assists  [place] cancer patients and their families through emotional , educational and financial support.  872-314-6450 ? [place] DSS Where to apply for food stamps, Medicaid and utility assistance. (301) 289-6241 ? RCATS: Transportation to medical appointments. 608 786 4438 ? Social Security Administration: May apply for disability if have a Stage IV cancer. 8637900513 949-334-1349 ? [employer], Disability and Transit Services: Assists with nutrition, care and transit needs. [place] Support Programs: @10RELATIVEDAYS @ > Cancer Support Group  2nd Tuesday of the month 1pm-2pm, Journey Room  > Creative Journey  3rd Tuesday of the month 1130am-1pm, Journey Room  > Look Good Feel Better  1st Wednesday of the month 10am-12 noon, Journey Room (Call [place] to register [phone])

## 2016-12-24 NOTE — Progress Notes (Signed)
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      06/24/2015 Imaging    CT chest- No specific features identified to suggest metastatic disease to the chest.      07/06/2015 Definitive Surgery    Robotic-assisted lower anterior resrction, rigid proctoscopy [name])- INVASIVE ADENOCARCINOMA, 2 cm, TUMOR INVADES INTO SUBMUCOSA. LVI/PNI (+).  1/14 LN (+). Margins neg.       07/06/2015 Miscellaneous    IHC Tumor Expression Results normal.  Negative for MLH1, MSH2, MSH6, & PMS2.       07/06/2015 Pathologic Stage    pT1, pN1a, pMx      08/15/2015 - 11/16/2015 Chemotherapy    FOLFOX x 6 cycles (adjuvant chemo "phase 1")      09/26/2015 Treatment Plan Change    Treatment held today.  5FU bolus D/C'd.  Neulasta OnPro added to each subsequent cycles of therapy.      10/04/2015 Treatment Plan Change    Neulasta onpro added to all subsequent cycles of treatment.      10/18/2015 Treatment Plan Change    Tx held for thrombocytopenia.  5FU CI is reduced by 20% for subsequent treatments.      12/05/2015 - 01/16/2016 Chemotherapy    5FU continuous infusion weekly x 6 cycles; concurrent radiation. (adjuvant chemo "phase 2")      12/05/2015 - 01/18/2016 Radiation Therapy    Pelvic radiation completed in [place] [name]).  Pelvis 45 Gy in 25 fractions.   Then cone down boost treatment for additional 5.4 Gy in 8 fractions.  Total dose: 50.4 Gy in 33 treatments      01/30/2016 - 02/13/2016 Chemotherapy    FOLFOX x 2 cycles to complete 6 months of adjuvant chemotherapy.  (adjuvant chemo "phase 3")      05/21/2016 Procedure    Colonoscopy by Dr. Gala Romney- The procedure was aborted due to inadequate bowel prep. Patient readily admits to eating at least applesauce yesterday which was in conflict with our written and verbal preparation instructions. No specimens collected.      06/19/2016 Imaging    CT abd/pelvis- Postsurgical changes of the rectum. New presacral soft tissue thickening likely post treatment related. Recommend attention on followup. Large amount of stool in the colon compatible with constipation. No evidence for distant metastatic dz.        HISTORY OF PRESENT ILLNESS:  As per Dr. Donald Pore note on 08/13/2016: "[name] 56 y.o. male is here for follow-up of his rectal cancer. He has undergone definitive surgical therapy and did remarkably well. Unfortunately he did have one lymph node that contained metastatic disease. CT imaging prior to surgery revealed no evidence of metastatic disease. He has completed all recommended therapy. He presents today to review recent imaging. "   INTERVAL HISTORY:  [name] presents to the  clinic today unaccompanied for routine follow-up for his history of rectal cancer.  I personally reviewed and went over labs with the patient.   Overall, he reports feeling well.  His last colonoscopy was done in 07/2016 with Dr. Sydell Axon; he tells me "they told me everything looked good."    States he has been doing well and feeling well. His appetite and energy levels are "pretty good."  His weight has been stable; it fluctuates 3-4 lbs.  Denies abdominal pain, nausea, vomiting, headaches, dizziness, diarrhea, melena/blood in stool, hematuria, cough, SOB, fever, and chills. Sleeping well; uses CPAP.   Since his  last visit to the cancer center, he did have 2 ED visits. The first was on 11/02/16 for acute cystitis and was treated with course of Keflex. The second was on 11/24/16 for gross hematuria; he was discharged from ED with referral to urology. The hematuria has since resolved and he has had no further episodes of hematuria.  He wants to know if he needs to keep the appointment with the urologist since his symptoms are better.    He has chronic arthralgias, particularly to "the whole right side of my body."  He ambulates with a cane when the weather is cold.     REVIEW OF SYSTEMS:  Review of Systems  Constitutional: Negative.  Negative for chills and fever.       Appetite is good  HENT:  Negative.   Eyes: Negative.   Respiratory: Negative.  Negative for cough and shortness of breath.   Cardiovascular: Negative.  Negative for chest pain and palpitations.  Gastrointestinal: Negative.  Negative for abdominal pain, blood in stool, diarrhea, nausea and vomiting.       Denies melena; denies stool incontinence   Endocrine: Negative.   Genitourinary: Negative.  Negative for bladder incontinence, dysuria and hematuria.        Occasional suprapubic pain; none at present.   Musculoskeletal: Positive for arthralgias (chronic; unchanged).  Skin: Negative.   Neurological: Negative.  Negative for dizziness and headaches.  Hematological: Negative.  Negative for adenopathy.  Psychiatric/Behavioral: Negative.  Negative for sleep disturbance.  All other systems reviewed and are negative.    PAST MEDICAL/SURGICAL HISTORY:  Past Medical History:  Diagnosis Date  . Arthritis   . Cancer ([place])    rectal  . Depression   . Diabetes mellitus    "Borderline"  . GERD (gastroesophageal reflux disease)    No weakness  . High cholesterol   . Hypertension   . Neuropathy ([place])    "back missed up"  . Rectal cancer ([place])   . Sleep apnea    Past Surgical History:  Procedure Laterality Date  . BACK SURGERY    .  BIOPSY  03/31/2015   Procedure: BIOPSY;  Surgeon: Daneil Dolin, MD;  Location: [facility];  Service: Endoscopy;;  . CHOLECYSTECTOMY    . COLONOSCOPY WITH PROPOFOL N/A 03/31/2015   Procedure: COLONOSCOPY WITH PROPOFOL at cecum 0842; withdrawal time=28mnutes;  Surgeon: RDaneil Dolin MD;  Location: [facility];  Service: Endoscopy;  Laterality: N/A;  . COLONOSCOPY WITH PROPOFOL N/A 05/21/2016   Procedure: COLONOSCOPY WITH PROPOFOL;  Surgeon: RDaneil Dolin MD;  Location: [facility] SU[facility];  Service: Endoscopy;  Laterality: N/A;  200 - moved to 12:45 - office calling pt with 11:15 arrival time  . COLONOSCOPY WITH PROPOFOL N/A 07/26/2016   Procedure: COLONOSCOPY WITH PROPOFOL;  Surgeon: RDaneil Dolin MD;  Location: [facility];  Service: Endoscopy;  Laterality: N/A;  [place] N/A 07/05/2015   Procedure: FLEXIBLE SIGMOIDOSCOPY;  Surgeon: [name], MD;  Location: [facility] ENDOS[facility];  Service: Endoscopy;  Laterality: N/A;  with tattoo  . POLYPECTOMY  03/31/2015   Procedure: POLYPECTOMY;  Surgeon: Daneil Dolin, MD;  Location: [facility];  Service: Endoscopy;;  . PORTACATH PLACEMENT N/A 08/09/2015   Procedure: INSERTION PORT-A-CATH LEFT SUBCLAVIAN;  Surgeon: [name], MD;  Location: [place];  Service: General;  Laterality: N/A;  . XI ROBOTIC ASSISTED LOWER ANTERIOR RESECTION N/A 07/06/2015   Procedure: XI ROBOTIC ASSISTED LOWER ANTERIOR RESECTION, rigid proctoscopy;  Surgeon: [name], MD;  Location: [facility]S;  Service: General;  Laterality: N/A;     SOCIAL HISTORY:  Social History   Social History  . Marital status: Divorced    Spouse name: N/A  . Number of children: N/A  . Years of education: N/A   Occupational History  . Not on file.   Social History Main Topics  . Smoking status: Former Smoker    Packs/day: 0.25    Years: 2.00    Types: Cigarettes    Quit date: 03/23/2006  . Smokeless tobacco: Never Used  . Alcohol use No  . Drug use: No  . Sexual activity: Not Currently     Other Topics Concern  . Not on file   Social History Narrative  . No narrative on file    FAMILY HISTORY:  Family History  Problem Relation Age of Onset  . Hypertension      "Not sure who, I don't know much about my family"  . Diabetes      "Not sure who, I don't know much about my family"  . Colon cancer Neg Hx     "Not that I know of"  . Colon polyps Neg Hx     "not that I know of"    CURRENT MEDICATIONS:  Outpatient Encounter Prescriptions as of 12/24/2016  Medication Sig  . ACCU-CHEK AVIVA PLUS test strip   . amLODipine (NORVASC) 5 MG tablet Take 1 tablet (5 mg total) by mouth every morning.  . ASSURE COMFORT LANCETS 30G MISC   . Blood Glucose Calibration (ACCU-CHEK AVIVA) SOLN   . cephALEXin (KEFLEX) 500 MG capsule Take 1 capsule (500 mg total) by mouth 4 (four) times daily. For 7 days  . gabapentin (NEURONTIN) 800 MG tablet Take 1 tablet (800 mg total) by mouth 3 (three) times daily.  Marland Kitchen HYDROcodone-acetaminophen (NORCO) 7.5-325 MG tablet Take 1 tablet by mouth every 12 (twelve) hours as needed for moderate pain.  Marland Kitchen lisinopril (PRINIVIL,ZESTRIL) 40 MG tablet Take 1 tablet (40 mg total) by mouth every morning.  . NON FORMULARY PT HAS A C-PAP MACHINE  . nortriptyline (PAMELOR) 75 MG capsule Take 1 capsule (75 mg total) by mouth at bedtime.  . polyethylene glycol-electrolytes (TRILYTE) 420 g solution Take 4,000 mLs by mouth as directed.  Marland Kitchen tiZANidine (ZANAFLEX) 2 MG tablet Take 1 tablet (2 mg total) by mouth every 8 (eight) hours as needed for muscle spasms.  . traMADol (ULTRAM) 50 MG tablet May take 1-2 tabs every 6 hours as needed for pain   No facility-administered encounter medications on file as of 12/24/2016.     ALLERGIES:  No Known Allergies     PHYSICAL EXAM:  ECOG Performance status: 1 - Symptomatic, but independent  Vitals:   12/24/16 1317  BP: 129/87  Pulse: 89  Resp: 16  Temp: 98 F (36.7 C)   Filed Weights  12/24/16 1317  Weight: 208 lb 4.8  oz (94.5 kg)    Physical Exam  Constitutional: He is oriented to person, place, and time and well-developed, well-nourished, and in no distress.  HENT:  Head: Normocephalic and atraumatic.  Mouth/Throat: Oropharynx is clear and moist. No oropharyngeal exudate.  Eyes: Conjunctivae are normal. Pupils are equal, round, and reactive to light. No scleral icterus.  Neck: Normal range of motion. Neck supple.  Cardiovascular: Normal rate, regular rhythm and normal heart sounds.   Pulmonary/Chest: Effort normal and breath sounds normal. No respiratory distress.  Abdominal: Soft. Bowel sounds are normal. He exhibits no distension and no mass. There is no tenderness.  Musculoskeletal: Normal range of motion. He exhibits no edema.  Lymphadenopathy:    He has no cervical adenopathy.       Right: No supraclavicular adenopathy present.       Left: No supraclavicular adenopathy present.  Neurological: He is alert and oriented to person, place, and time.  Ambulates with cane  Skin: Skin is warm and dry. No rash noted.  Psychiatric: Mood, memory, affect and judgment normal.  Nursing note and vitals reviewed.    LABORATORY DATA:  I have reviewed the labs as listed.  CBC    Component Value Date/Time   WBC 3.0 (L) 12/13/2016 1402   RBC 4.14 (L) 12/13/2016 1402   HGB 12.4 (L) 12/13/2016 1402   HCT 36.9 (L) 12/13/2016 1402   PLT 158 12/13/2016 1402   MCV 89.1 12/13/2016 1402   MCH 30.0 12/13/2016 1402   MCHC 33.6 12/13/2016 1402   RDW 12.5 12/13/2016 1402   LYMPHSABS 0.7 12/13/2016 1402   MONOABS 0.2 12/13/2016 1402   EOSABS 0.3 12/13/2016 1402   BASOSABS 0.0 12/13/2016 1402   CMP Latest Ref Rng & Units 12/13/2016 11/24/2016 11/02/2016  Glucose 65 - 99 mg/dL 112(H) 100(H) 78  BUN 6 - 20 mg/dL _0 Creatinine 0.61 - 1.24 mg/dL 1.02 1.07 1.26(H)  Sodium 135 - 145 mmol/L 138 135 135  Potassium 3.5 - 5.1 mmol/L 3.7 4.1 4.4  Chloride 101 - 111 mmol/L 105 103 104  CO2 22 - 32 mmol/L _1 Calcium 8.9 - 10.3 mg/dL 9.3 9.1 8.3(L)  Total Protein 6.5 - 8.1 g/dL 7.7 - 7.4  Total Bilirubin 0.3 - 1.2 mg/dL 0.5 - 0.8  Alkaline Phos 38 - 126 U/L 73 - 59  AST 15 - 41 U/L 31 - 23  ALT 17 - 63 U/L 33 - 26   Results for DANNON, NGUYENTHI (MRN [id number])   Ref. Range 12/13/2016 14:02  CEA Latest Ref Range: 0.0 - 4.7 ng/mL 4.4    PENDING LABS:    DIAGNOSTIC IMAGING:  CT abd/pelvis: 06/19/16     PROCEDURE:  Last colonoscopy: 07/26/16     PATHOLOGY:  Surgical path     ASSESSMENT & PLAN: [name] is a pleasant 56 y.o. male with history of Stage IIIA adenocarcinoma of the rectum, diagnosed in 03/2015; treated with lower anterior resection (Dr. [name]), followed by adjuvant chemo with FOLFOX x 6 cycles, concurrent chemoradiation with 5-FU, then completed 2 additional cycles of FOLOX to complete 6 months of adjuvant chemo; completed treatment on 02/13/16. He presents to cancer center for continued follow-up.   Stage IIIA adenocarcinoma of the rectum:  -He is now 1.5+ years out from his original rectal cancer diagnosis (2 years will be 03/2017).  His last surveillance CT imaging completed on 06/19/16, showed post-surgical changed  in the rectum and new presacral soft tissue thickening likely post-treatment related; attention on follow-up recommended; there was no evidence of distant metastatic disease.   -CEA stable and normal today at 4.4.  Last colonoscopy done in 07/2016 with Dr. Sydell Axon with GI and was negative.  -He will return in 6 months for continued physical exam and labs, including CEA.  -According to NCCN Guidelines, he will be due for repeat CT imaging in 06/2017. (performing scans at a frequency of <12 months is considered category 2B evidence per NCCN). Scan has already been ordered and scheduled.    Hematuria: -He has had 2 ED visits for UTI and gross hematuria since his last visit to the cancer center.  Cystitis was evident on CT performed in ED in 10/2016.  Cystitis  could be a secondary delayed treatment side effects with concurrent chemoradiation.  He was referred to urology for evaluation after his most recent ED visit.  Although his hematuria has resolved, we discussed the importance of this referral for continued work-up. He agreed with this plan.     NCCN Guidelines reviewed for continued surveillance & Survivorship for Rectal Cancer:  Surveillance Guidelines for Rectal Cancer:    Survivorship Guidelines for Rectal Cancer:      Dispo:  -Return to cancer center in 6 months for continued follow-up.    All questions were answered to patient's stated satisfaction. Encouraged patient to call with any new concerns or questions before his next visit to the cancer center and we can certain see him sooner, if needed.    This document serves as a record of services personally performed by Mike Craze, NP. It was created on her behalf by Shirlean Mylar, a trained medical scribe. The creation of this record is based on the scribe's personal observations and the provider's statements to them. This document has been checked and approved by the attending provider.  I have reviewed the above documentation for accuracy and completeness and I agree with the above.   Mike Craze, NP [place] [phone]

## 2016-12-26 ENCOUNTER — Other Ambulatory Visit (HOSPITAL_COMMUNITY)
Admission: RE | Admit: 2016-12-26 | Discharge: 2016-12-26 | Disposition: A | Discharge status: Home / Self Care | Payer: Medicare HMO | Source: Ambulatory Visit | Attending: Urology | Admitting: Urology

## 2016-12-26 ENCOUNTER — Ambulatory Visit (INDEPENDENT_AMBULATORY_CARE_PROVIDER_SITE_OTHER): Payer: Medicare HMO | Admitting: Urology

## 2016-12-26 DIAGNOSIS — R310 Gross hematuria: Secondary | ICD-10-CM

## 2016-12-27 DIAGNOSIS — N419 Inflammatory disease of prostate, unspecified: Secondary | ICD-10-CM | POA: Diagnosis not present

## 2016-12-27 DIAGNOSIS — I10 Essential (primary) hypertension: Secondary | ICD-10-CM | POA: Diagnosis not present

## 2017-01-02 ENCOUNTER — Ambulatory Visit (HOSPITAL_COMMUNITY): Payer: Medicare HMO

## 2017-01-08 ENCOUNTER — Other Ambulatory Visit (HOSPITAL_COMMUNITY): Payer: Self-pay

## 2017-01-08 ENCOUNTER — Telehealth (HOSPITAL_COMMUNITY): Payer: Self-pay | Admitting: *Deleted

## 2017-01-08 DIAGNOSIS — C20 Malignant neoplasm of rectum: Secondary | ICD-10-CM

## 2017-01-08 MED ORDER — TRAMADOL HCL 50 MG PO TABS: ORAL_TABLET | ORAL | 0 refills | Status: DC

## 2017-01-08 MED ORDER — HYDROCODONE-ACETAMINOPHEN 7.5-325 MG PO TABS: 1.0000 | ORAL_TABLET | Freq: Two times a day (BID) | ORAL | 0 refills | Status: DC | PRN

## 2017-01-08 NOTE — Telephone Encounter (Signed)
Patient called for refill on pain medication. Reviewed with Pa, chart checked and refilled.

## 2017-01-08 NOTE — Telephone Encounter (Signed)
Prescriptions are ready at front desk on 4th floor. Left message for patient.

## 2017-01-21 DIAGNOSIS — G4733 Obstructive sleep apnea (adult) (pediatric): Secondary | ICD-10-CM | POA: Diagnosis not present

## 2017-02-06 ENCOUNTER — Ambulatory Visit (INDEPENDENT_AMBULATORY_CARE_PROVIDER_SITE_OTHER): Payer: Medicare HMO | Admitting: Urology

## 2017-02-06 ENCOUNTER — Other Ambulatory Visit (HOSPITAL_COMMUNITY): Payer: Self-pay | Admitting: *Deleted

## 2017-02-06 ENCOUNTER — Other Ambulatory Visit (HOSPITAL_COMMUNITY)
Admission: RE | Admit: 2017-02-06 | Discharge: 2017-02-06 | Disposition: A | Discharge status: Home / Self Care | Payer: Medicare HMO | Source: Ambulatory Visit | Attending: Urology | Admitting: Urology

## 2017-02-06 DIAGNOSIS — R310 Gross hematuria: Secondary | ICD-10-CM

## 2017-02-06 DIAGNOSIS — C20 Malignant neoplasm of rectum: Secondary | ICD-10-CM

## 2017-02-06 MED ORDER — HYDROCODONE-ACETAMINOPHEN 7.5-325 MG PO TABS: 1.0000 | ORAL_TABLET | Freq: Two times a day (BID) | ORAL | 0 refills | Status: DC | PRN

## 2017-02-06 MED ORDER — TRAMADOL HCL 50 MG PO TABS: ORAL_TABLET | ORAL | 0 refills | Status: DC

## 2017-02-07 ENCOUNTER — Encounter (HOSPITAL_COMMUNITY): Payer: Medicare HMO | Attending: Hematology & Oncology

## 2017-02-07 ENCOUNTER — Encounter (HOSPITAL_COMMUNITY): Payer: Self-pay

## 2017-02-07 VITALS — BP 133/88 | HR 89 | Temp 98.0°F | Resp 16

## 2017-02-07 DIAGNOSIS — C20 Malignant neoplasm of rectum: Secondary | ICD-10-CM | POA: Insufficient documentation

## 2017-02-07 DIAGNOSIS — Z452 Encounter for adjustment and management of vascular access device: Secondary | ICD-10-CM

## 2017-02-07 MED ORDER — SODIUM CHLORIDE 0.9% FLUSH
20.0000 mL | INTRAVENOUS | Status: DC | PRN
  Administered 2017-02-07: 20 mL via INTRAVENOUS
  Filled 2017-02-07: qty 20

## 2017-02-07 MED ORDER — HEPARIN SOD (PORK) LOCK FLUSH 100 UNIT/ML IV SOLN
500.0000 [IU] | Freq: Once | INTRAVENOUS | Status: AC
  Administered 2017-02-07: 500 [IU] via INTRAVENOUS
  Filled 2017-02-07: qty 5

## 2017-02-07 NOTE — Patient Instructions (Signed)
[facility] at [facility] Discharge Instructions  RECOMMENDATIONS MADE BY THE CONSULTANT AND ANY TEST RESULTS WILL BE SENT TO YOUR REFERRING PHYSICIAN.  Port flush.    Thank you for choosing [facility] at [facility] to provide your oncology and hematology care.  To afford each patient quality time with our provider, please arrive at least 15 minutes before your scheduled appointment time.    If you have a lab appointment with the Cancer Center please come in thru the  Main Entrance and check in at the main information desk  You need to re-schedule your appointment should you arrive 10 or more minutes late.  We strive to give you quality time with our providers, and arriving late affects you and other patients whose appointments are after yours.  Also, if you no show three or more times for appointments you may be dismissed from the clinic at the providers discretion.     Again, thank you for choosing [facility].  Our hope is that these requests will decrease the amount of time that you wait before being seen by our physicians.       _____________________________________________________________  Should you have questions after your visit to  Cancer Center, please contact our office at [phone] between the hours of 8:30 a.m. and 4:30 p.m.  Voicemails left after 4:30 p.m. will not be returned until the following business day.  For prescription refill requests, have your pharmacy contact our office.       Resources For Cancer Patients and their Caregivers ? American Cancer Society: Can assist with transportation, wigs, general needs, runs Look Good Feel Better.        1-888-227-6333 ? Cancer Care: Provides financial assistance, online support groups, medication/co-pay assistance.  1-800-813-HOPE (4673) ? [facility] Assists [place] cancer patients and their families through emotional ,  educational and financial support.  336-427-4357 ? [place] DSS Where to apply for food stamps, Medicaid and utility assistance. 336-342-1394 ? RCATS: Transportation to medical appointments. 336-347-2287 ? Social Security Administration: May apply for disability if have a Stage IV cancer. 336-342-7796 1-800-772-1213 ? [place] [employer], Disability and Transit Services: Assists with nutrition, care and transit needs. 336-349-2343  Cancer Center Support Programs: @10RELATIVEDAYS@ > Cancer Support Group  2nd Tuesday of the month 1pm-2pm, Journey Room  > Creative Journey  3rd Tuesday of the month 1130am-1pm, Journey Room  > Look Good Feel Better  1st Wednesday of the month 10am-12 noon, Journey Room (Call American Cancer Society to register 1-800-395-5775)    

## 2017-02-07 NOTE — Progress Notes (Signed)
[name] presented for Portacath access and flush. Proper placement of portacath confirmed by CXR. Portacath located left chest wall accessed with  H 20 needle. Good blood return present. Portacath flushed with 52ml NS and 500U/60ml Heparin and needle removed intact. Procedure without incident. Patient tolerated procedure well.

## 2017-02-18 DIAGNOSIS — Z0000 Encounter for general adult medical examination without abnormal findings: Secondary | ICD-10-CM | POA: Diagnosis not present

## 2017-02-18 DIAGNOSIS — F339 Major depressive disorder, recurrent, unspecified: Secondary | ICD-10-CM | POA: Diagnosis not present

## 2017-02-18 DIAGNOSIS — E119 Type 2 diabetes mellitus without complications: Secondary | ICD-10-CM | POA: Diagnosis not present

## 2017-02-18 DIAGNOSIS — I10 Essential (primary) hypertension: Secondary | ICD-10-CM | POA: Diagnosis not present

## 2017-02-18 DIAGNOSIS — R2681 Unsteadiness on feet: Secondary | ICD-10-CM | POA: Diagnosis not present

## 2017-02-18 DIAGNOSIS — Z1389 Encounter for screening for other disorder: Secondary | ICD-10-CM | POA: Diagnosis not present

## 2017-02-18 DIAGNOSIS — E785 Hyperlipidemia, unspecified: Secondary | ICD-10-CM | POA: Diagnosis not present

## 2017-02-18 DIAGNOSIS — R7303 Prediabetes: Secondary | ICD-10-CM | POA: Diagnosis not present

## 2017-02-18 DIAGNOSIS — G63 Polyneuropathy in diseases classified elsewhere: Secondary | ICD-10-CM | POA: Diagnosis not present

## 2017-03-06 ENCOUNTER — Telehealth (HOSPITAL_COMMUNITY): Payer: Self-pay | Admitting: *Deleted

## 2017-03-06 ENCOUNTER — Encounter (HOSPITAL_COMMUNITY): Payer: Self-pay | Admitting: Adult Health

## 2017-03-06 ENCOUNTER — Other Ambulatory Visit (HOSPITAL_COMMUNITY): Payer: Self-pay | Admitting: Adult Health

## 2017-03-06 DIAGNOSIS — C20 Malignant neoplasm of rectum: Secondary | ICD-10-CM

## 2017-03-06 MED ORDER — HYDROCODONE-ACETAMINOPHEN 7.5-325 MG PO TABS: 1.0000 | ORAL_TABLET | Freq: Two times a day (BID) | ORAL | 0 refills | Status: DC | PRN

## 2017-03-06 MED ORDER — TRAMADOL HCL 50 MG PO TABS: ORAL_TABLET | ORAL | 0 refills | Status: DC

## 2017-03-06 NOTE — Progress Notes (Signed)
Patient called cancer center requesting refill of Norco and Tramadol.   [place] Controlled Substance Registry reviewed and refill is appropriate. Paper prescriptions printed and left at cancer center front desk for patient to retrieve after showing photo ID per clinic policy.   [place] reviewed:     Mike Craze, NP [place] [phone]

## 2017-03-06 NOTE — Telephone Encounter (Signed)
Paper prescriptions printed and signed; placed in folder for patient pick-up.   Mike Craze, NP [place] [phone]

## 2017-03-06 NOTE — Telephone Encounter (Signed)
Thank you :)

## 2017-03-12 DIAGNOSIS — Z08 Encounter for follow-up examination after completed treatment for malignant neoplasm: Secondary | ICD-10-CM | POA: Diagnosis not present

## 2017-03-12 DIAGNOSIS — Z85048 Personal history of other malignant neoplasm of rectum, rectosigmoid junction, and anus: Secondary | ICD-10-CM | POA: Diagnosis not present

## 2017-04-02 ENCOUNTER — Encounter (HOSPITAL_COMMUNITY): Payer: Self-pay | Admitting: Adult Health

## 2017-04-02 ENCOUNTER — Telehealth (HOSPITAL_COMMUNITY): Payer: Self-pay | Admitting: *Deleted

## 2017-04-02 ENCOUNTER — Other Ambulatory Visit (HOSPITAL_COMMUNITY): Payer: Self-pay | Admitting: Adult Health

## 2017-04-02 DIAGNOSIS — C20 Malignant neoplasm of rectum: Secondary | ICD-10-CM

## 2017-04-02 MED ORDER — TRAMADOL HCL 50 MG PO TABS: ORAL_TABLET | ORAL | 0 refills | Status: DC

## 2017-04-02 MED ORDER — HYDROCODONE-ACETAMINOPHEN 7.5-325 MG PO TABS: 1.0000 | ORAL_TABLET | Freq: Two times a day (BID) | ORAL | 0 refills | Status: DC | PRN

## 2017-04-02 NOTE — Progress Notes (Signed)
Patient called cancer center requesting refill of Norco and Tramadol.   [place] Controlled Substance Reporting System reviewed and refill is appropriate on or after today, 04/02/17. Paper prescriptions printed; Rx left at cancer center front desk for patient to retrieve after showing photo ID per clinic policy.   NCCSRS reviewed:     Mike Craze, NP [place] [phone]

## 2017-04-02 NOTE — Telephone Encounter (Signed)
Prescriptions printed and left at front desk for patient to pick up.   Mike Craze, NP [place] [phone]

## 2017-04-04 ENCOUNTER — Encounter (HOSPITAL_COMMUNITY): Payer: Self-pay

## 2017-04-04 ENCOUNTER — Encounter (HOSPITAL_COMMUNITY): Payer: Medicare HMO | Attending: Hematology & Oncology

## 2017-04-04 DIAGNOSIS — Z452 Encounter for adjustment and management of vascular access device: Secondary | ICD-10-CM

## 2017-04-04 DIAGNOSIS — C20 Malignant neoplasm of rectum: Secondary | ICD-10-CM | POA: Insufficient documentation

## 2017-04-04 MED ORDER — SODIUM CHLORIDE 0.9% FLUSH
10.0000 mL | INTRAVENOUS | Status: DC | PRN
  Administered 2017-04-04: 10 mL via INTRAVENOUS
  Filled 2017-04-04: qty 10

## 2017-04-04 MED ORDER — HEPARIN SOD (PORK) LOCK FLUSH 100 UNIT/ML IV SOLN
500.0000 [IU] | Freq: Once | INTRAVENOUS | Status: AC
  Administered 2017-04-04: 500 [IU] via INTRAVENOUS

## 2017-04-04 MED ORDER — HEPARIN SOD (PORK) LOCK FLUSH 100 UNIT/ML IV SOLN
INTRAVENOUS | Status: AC
  Filled 2017-04-04: qty 5

## 2017-04-04 NOTE — Patient Instructions (Signed)
[facility] at [facility] Discharge Instructions  RECOMMENDATIONS MADE BY THE CONSULTANT AND ANY TEST RESULTS WILL BE SENT TO YOUR REFERRING PHYSICIAN.  Port flush today. Return as scheduled.   Thank you for choosing [facility] at [facility] to provide your oncology and hematology care.  To afford each patient quality time with our provider, please arrive at least 15 minutes before your scheduled appointment time.    If you have a lab appointment with the Cancer Center please come in thru the  Main Entrance and check in at the main information desk  You need to re-schedule your appointment should you arrive 10 or more minutes late.  We strive to give you quality time with our providers, and arriving late affects you and other patients whose appointments are after yours.  Also, if you no show three or more times for appointments you may be dismissed from the clinic at the providers discretion.     Again, thank you for choosing [facility].  Our hope is that these requests will decrease the amount of time that you wait before being seen by our physicians.       _____________________________________________________________  Should you have questions after your visit to  Cancer Center, please contact our office at [phone] between the hours of 8:30 a.m. and 4:30 p.m.  Voicemails left after 4:30 p.m. will not be returned until the following business day.  For prescription refill requests, have your pharmacy contact our office.       Resources For Cancer Patients and their Caregivers ? American Cancer Society: Can assist with transportation, wigs, general needs, runs Look Good Feel Better.        1-888-227-6333 ? Cancer Care: Provides financial assistance, online support groups, medication/co-pay assistance.  1-800-813-HOPE (4673) ? [facility] Assists [place] cancer patients and their  families through emotional , educational and financial support.  336-427-4357 ? [place] DSS Where to apply for food stamps, Medicaid and utility assistance. 336-342-1394 ? RCATS: Transportation to medical appointments. 336-347-2287 ? Social Security Administration: May apply for disability if have a Stage IV cancer. 336-342-7796 1-800-772-1213 ? [place] Aging, Disability and Transit Services: Assists with nutrition, care and transit needs. 336-349-2343  Cancer Center Support Programs: @10RELATIVEDAYS@ > Cancer Support Group  2nd Tuesday of the month 1pm-2pm, Journey Room  > Creative Journey  3rd Tuesday of the month 1130am-1pm, Journey Room  > Look Good Feel Better  1st Wednesday of the month 10am-12 noon, Journey Room (Call American Cancer Society to register 1-800-395-5775)   

## 2017-04-04 NOTE — Progress Notes (Signed)
[name] presented for Portacath access and flush.  Portacath located left chest wall accessed with  H 20 needle.  Good blood return present. Portacath flushed with 52ml NS and 500U/65ml Heparin and needle removed intact.  Procedure tolerated well and without incident.  Discharged ambulatory.

## 2017-04-25 DIAGNOSIS — G4733 Obstructive sleep apnea (adult) (pediatric): Secondary | ICD-10-CM | POA: Diagnosis not present

## 2017-05-03 ENCOUNTER — Telehealth (HOSPITAL_COMMUNITY): Payer: Self-pay | Admitting: *Deleted

## 2017-05-06 ENCOUNTER — Other Ambulatory Visit (HOSPITAL_COMMUNITY): Payer: Self-pay | Admitting: *Deleted

## 2017-05-06 DIAGNOSIS — C20 Malignant neoplasm of rectum: Secondary | ICD-10-CM

## 2017-05-06 MED ORDER — HYDROCODONE-ACETAMINOPHEN 7.5-325 MG PO TABS: 1.0000 | ORAL_TABLET | Freq: Two times a day (BID) | ORAL | 0 refills | Status: DC | PRN

## 2017-05-06 MED ORDER — TRAMADOL HCL 50 MG PO TABS: ORAL_TABLET | ORAL | 0 refills | Status: DC

## 2017-05-06 NOTE — Telephone Encounter (Signed)
Pt came into clinic stating that he had called Friday for refills. Pt asked for refills and they were printed and given to the pt.

## 2017-05-30 DIAGNOSIS — R7303 Prediabetes: Secondary | ICD-10-CM | POA: Diagnosis not present

## 2017-05-30 DIAGNOSIS — G4733 Obstructive sleep apnea (adult) (pediatric): Secondary | ICD-10-CM | POA: Diagnosis not present

## 2017-05-30 DIAGNOSIS — I10 Essential (primary) hypertension: Secondary | ICD-10-CM | POA: Diagnosis not present

## 2017-06-03 ENCOUNTER — Other Ambulatory Visit (HOSPITAL_COMMUNITY): Payer: Self-pay | Admitting: Oncology

## 2017-06-03 ENCOUNTER — Telehealth (HOSPITAL_COMMUNITY): Payer: Self-pay | Admitting: *Deleted

## 2017-06-03 DIAGNOSIS — C20 Malignant neoplasm of rectum: Secondary | ICD-10-CM

## 2017-06-03 MED ORDER — HYDROCODONE-ACETAMINOPHEN 7.5-325 MG PO TABS: 1.0000 | ORAL_TABLET | Freq: Two times a day (BID) | ORAL | 0 refills | Status: DC | PRN

## 2017-06-03 MED ORDER — TRAMADOL HCL 50 MG PO TABS: ORAL_TABLET | ORAL | 0 refills | Status: DC

## 2017-06-04 ENCOUNTER — Encounter (HOSPITAL_COMMUNITY): Payer: Medicare HMO | Attending: Hematology & Oncology

## 2017-06-04 ENCOUNTER — Encounter (HOSPITAL_COMMUNITY): Payer: Self-pay

## 2017-06-04 DIAGNOSIS — C20 Malignant neoplasm of rectum: Secondary | ICD-10-CM | POA: Insufficient documentation

## 2017-06-04 LAB — COMPREHENSIVE METABOLIC PANEL
ALBUMIN: 4.1 g/dL (ref 3.5–5.0)
ALT: 43 U/L (ref 17–63)
AST: 39 U/L (ref 15–41)
Alkaline Phosphatase: 76 U/L (ref 38–126)
Anion gap: 8 (ref 5–15)
BILIRUBIN TOTAL: 0.6 mg/dL (ref 0.3–1.2)
BUN: 14 mg/dL (ref 6–20)
CO2: 25 mmol/L (ref 22–32)
CREATININE: 1.1 mg/dL (ref 0.61–1.24)
Calcium: 9.2 mg/dL (ref 8.9–10.3)
Chloride: 103 mmol/L (ref 101–111)
GFR calc Af Amer: >60 mL/min (ref >60)
GLUCOSE: 142 mg/dL — AB (ref 65–99)
Potassium: 3.7 mmol/L (ref 3.5–5.1)
Sodium: 136 mmol/L (ref 135–145)
TOTAL PROTEIN: 7.9 g/dL (ref 6.5–8.1)

## 2017-06-04 LAB — CBC WITH DIFFERENTIAL/PLATELET
BASOS ABS: 0 10*3/uL (ref 0.0–0.1)
Basophils Relative: 1 %
Eosinophils Absolute: 0.5 10*3/uL (ref 0.0–0.7)
Eosinophils Relative: 13 %
HEMATOCRIT: 39.1 % (ref 39.0–52.0)
HEMOGLOBIN: 13.1 g/dL (ref 13.0–17.0)
LYMPHS PCT: 23 %
Lymphs Abs: 0.9 10*3/uL (ref 0.7–4.0)
MCH: 29.8 pg (ref 26.0–34.0)
MCHC: 33.5 g/dL (ref 30.0–36.0)
MCV: 88.9 fL (ref 78.0–100.0)
Monocytes Absolute: 0.2 10*3/uL (ref 0.1–1.0)
Monocytes Relative: 5 %
NEUTROS ABS: 2.2 10*3/uL (ref 1.7–7.7)
NEUTROS PCT: 58 %
Platelets: 167 10*3/uL (ref 150–400)
RBC: 4.4 MIL/uL (ref 4.22–5.81)
RDW: 12.6 % (ref 11.5–15.5)
WBC: 3.8 10*3/uL — AB (ref 4.0–10.5)

## 2017-06-04 MED ORDER — SODIUM CHLORIDE 0.9% FLUSH
20.0000 mL | INTRAVENOUS | Status: DC | PRN
  Administered 2017-06-04: 20 mL via INTRAVENOUS
  Filled 2017-06-04: qty 20

## 2017-06-04 MED ORDER — HEPARIN SOD (PORK) LOCK FLUSH 100 UNIT/ML IV SOLN
500.0000 [IU] | Freq: Once | INTRAVENOUS | Status: AC
  Administered 2017-06-04: 500 [IU] via INTRAVENOUS

## 2017-06-04 MED ORDER — HEPARIN SOD (PORK) LOCK FLUSH 100 UNIT/ML IV SOLN
INTRAVENOUS | Status: AC
  Filled 2017-06-04: qty 5

## 2017-06-04 NOTE — Progress Notes (Signed)
[name] tolerated port lab draw with port flush well without complaints or incident. Port accessed with 20 gauge needle with blood return noted then flushed with 20 ml NS and 5 ml Heparin easily per protocol then de-accessed. VSS Pt discharged  self ambulatory using a cane in satisfactory condition

## 2017-06-04 NOTE — Patient Instructions (Signed)
[facility] at [facility] Discharge Instructions  RECOMMENDATIONS MADE BY THE CONSULTANT AND ANY TEST RESULTS WILL BE SENT TO YOUR REFERRING PHYSICIAN.  Labs drawn from portacath today then port flushed per protocol. Follow-up as scheduled. Call clinic for any questions or concerns  Thank you for choosing [facility] at [facility] to provide your oncology and hematology care.  To afford each patient quality time with our provider, please arrive at least 15 minutes before your scheduled appointment time.    If you have a lab appointment with the Cancer Center please come in thru the  Main Entrance and check in at the main information desk  You need to re-schedule your appointment should you arrive 10 or more minutes late.  We strive to give you quality time with our providers, and arriving late affects you and other patients whose appointments are after yours.  Also, if you no show three or more times for appointments you may be dismissed from the clinic at the providers discretion.     Again, thank you for choosing [facility].  Our hope is that these requests will decrease the amount of time that you wait before being seen by our physicians.       _____________________________________________________________  Should you have questions after your visit to  Cancer Center, please contact our office at [phone] between the hours of 8:30 a.m. and 4:30 p.m.  Voicemails left after 4:30 p.m. will not be returned until the following business day.  For prescription refill requests, have your pharmacy contact our office.       Resources For Cancer Patients and their Caregivers ? American Cancer Society: Can assist with transportation, wigs, general needs, runs Look Good Feel Better.        1-888-227-6333 ? Cancer Care: Provides financial assistance, online support groups, medication/co-pay assistance.  1-800-813-HOPE  (4673) ? [facility] Assists [place] cancer patients and their families through emotional , educational and financial support.  336-427-4357 ? [place] DSS Where to apply for food stamps, Medicaid and utility assistance. 336-342-1394 ? RCATS: Transportation to medical appointments. 336-347-2287 ? Social Security Administration: May apply for disability if have a Stage IV cancer. 336-342-7796 1-800-772-1213 ? [place] Aging, Disability and Transit Services: Assists with nutrition, care and transit needs. 336-349-2343  Cancer Center Support Programs: @10RELATIVEDAYS@ > Cancer Support Group  2nd Tuesday of the month 1pm-2pm, Journey Room  > Creative Journey  3rd Tuesday of the month 1130am-1pm, Journey Room  > Look Good Feel Better  1st Wednesday of the month 10am-12 noon, Journey Room (Call American Cancer Society to register 1-800-395-5775)   

## 2017-06-05 LAB — CEA: CEA: 5 ng/mL — ABNORMAL HIGH (ref 0.0–4.7)

## 2017-06-13 IMAGING — DX DG CHEST 1V PORT
1 series · 1 of 1 positions shown · non-contrast
Comparison: 06/24/2015

CLINICAL DATA: Status post port placement

EXAM:
PORTABLE CHEST - 1 VIEW

[chest ap]
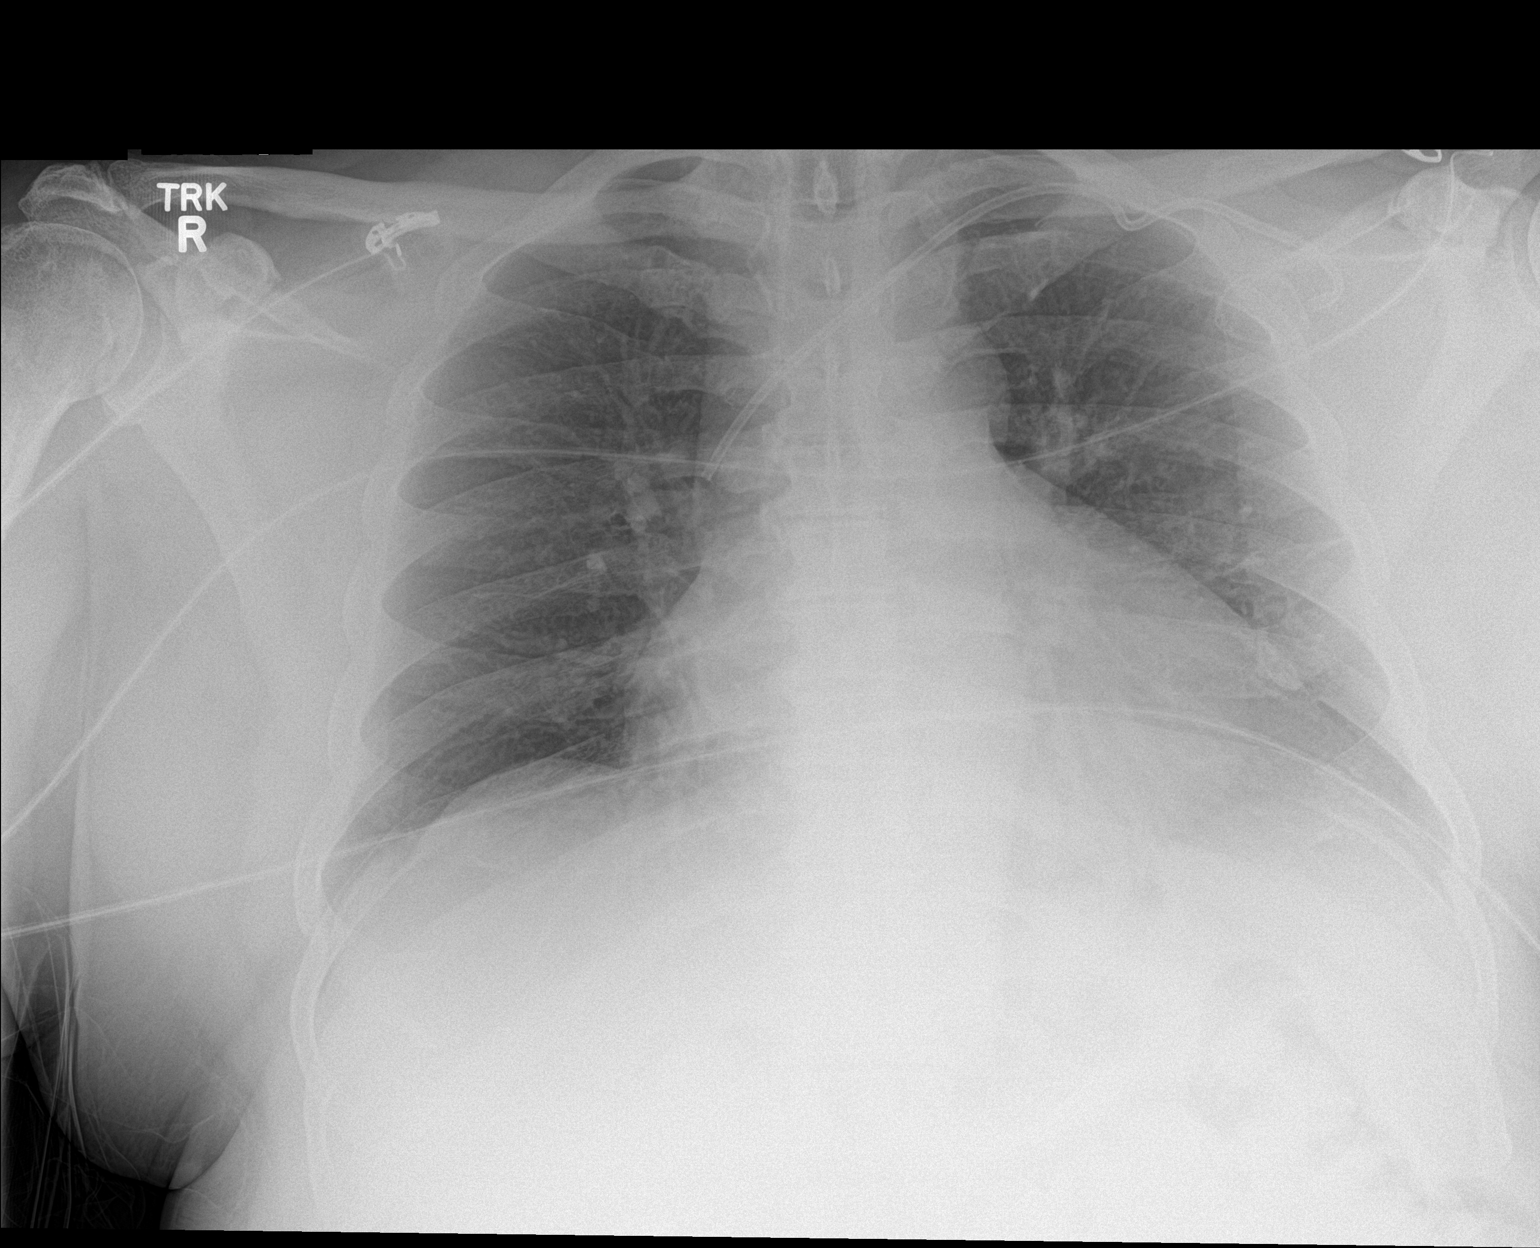

[1 of 1 positions shown; findings below may reference images not displayed]

FINDINGS: Cardiac shadow is mildly prominent accentuated by the portable
technique. A new left chest wall port is noted with the catheter tip
in the mid superior vena cava. Some slight downward depression is
noted at the vein entry site which may be related to some extrinsic
impingement although no deformity of the catheter is seen. The lungs
are clear. No pneumothorax is noted.
IMPRESSION: No pneumothorax following port placement. Slight depression of the
catheter is noted without significant deformity

## 2017-06-21 ENCOUNTER — Ambulatory Visit (HOSPITAL_COMMUNITY)
Admission: RE | Admit: 2017-06-21 | Discharge: 2017-06-21 | Disposition: A | Discharge status: Home / Self Care | Payer: Medicare HMO | Source: Ambulatory Visit | Attending: Oncology | Admitting: Oncology

## 2017-06-21 DIAGNOSIS — Z9049 Acquired absence of other specified parts of digestive tract: Secondary | ICD-10-CM | POA: Diagnosis not present

## 2017-06-21 DIAGNOSIS — C20 Malignant neoplasm of rectum: Secondary | ICD-10-CM | POA: Insufficient documentation

## 2017-06-21 DIAGNOSIS — C218 Malignant neoplasm of overlapping sites of rectum, anus and anal canal: Secondary | ICD-10-CM | POA: Diagnosis not present

## 2017-06-21 MED ORDER — IOPAMIDOL (ISOVUE-300) INJECTION 61%
100.0000 mL | Freq: Once | INTRAVENOUS | Status: AC | PRN
  Administered 2017-06-21: 100 mL via INTRAVENOUS

## 2017-06-25 ENCOUNTER — Ambulatory Visit (HOSPITAL_COMMUNITY): Payer: Medicare HMO | Admitting: Oncology

## 2017-06-25 ENCOUNTER — Encounter (HOSPITAL_COMMUNITY): Payer: Medicare HMO | Attending: Hematology & Oncology | Admitting: Oncology

## 2017-06-25 VITALS — BP 130/92 | HR 96 | Temp 98.6°F | Resp 16 | Wt 209.0 lb

## 2017-06-25 DIAGNOSIS — Z85048 Personal history of other malignant neoplasm of rectum, rectosigmoid junction, and anus: Secondary | ICD-10-CM | POA: Diagnosis not present

## 2017-06-25 DIAGNOSIS — Z9221 Personal history of antineoplastic chemotherapy: Secondary | ICD-10-CM | POA: Diagnosis not present

## 2017-06-25 DIAGNOSIS — G8929 Other chronic pain: Secondary | ICD-10-CM

## 2017-06-25 DIAGNOSIS — Z923 Personal history of irradiation: Secondary | ICD-10-CM | POA: Diagnosis not present

## 2017-06-25 DIAGNOSIS — M545 Low back pain: Secondary | ICD-10-CM

## 2017-06-25 DIAGNOSIS — C20 Malignant neoplasm of rectum: Secondary | ICD-10-CM

## 2017-06-25 MED ORDER — TRAMADOL HCL 50 MG PO TABS: ORAL_TABLET | ORAL | 0 refills | Status: DC

## 2017-06-25 MED ORDER — HYDROCODONE-ACETAMINOPHEN 7.5-325 MG PO TABS: 1.0000 | ORAL_TABLET | Freq: Two times a day (BID) | ORAL | 0 refills | Status: DC | PRN

## 2017-06-25 NOTE — Progress Notes (Signed)
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      06/24/2015 Imaging    CT chest- No specific features identified to suggest metastatic disease to the chest.      07/06/2015 Definitive Surgery    Robotic-assisted lower anterior resrction, rigid proctoscopy [name])- INVASIVE ADENOCARCINOMA, 2 cm, TUMOR INVADES INTO SUBMUCOSA. LVI/PNI (+).  1/14 LN (+). Margins neg.       07/06/2015 Miscellaneous    IHC Tumor Expression Results normal.  Negative for MLH1, MSH2, MSH6, & PMS2.       07/06/2015 Pathologic Stage    pT1, pN1a, pMx      08/15/2015 - 11/16/2015 Chemotherapy    FOLFOX x 6 cycles (adjuvant chemo "phase 1")      09/26/2015 Treatment Plan Change    Treatment held today.  5FU bolus D/C'd.  Neulasta OnPro added to each subsequent cycles of therapy.      10/04/2015 Treatment Plan Change    Neulasta onpro added to all subsequent cycles of treatment.      10/18/2015 Treatment Plan Change    Tx held for thrombocytopenia.  5FU CI is reduced by 20% for subsequent treatments.      12/05/2015 - 01/16/2016 Chemotherapy    5FU continuous infusion weekly x 6 cycles; concurrent radiation. (adjuvant chemo "phase 2")      12/05/2015 - 01/18/2016 Radiation Therapy    Pelvic radiation completed in [facility]).  Pelvis 45 Gy in 25 fractions.   Then cone down boost treatment for additional 5.4 Gy in 8 fractions.  Total dose: 50.4 Gy in 33 treatments      01/30/2016 - 02/13/2016 Chemotherapy    FOLFOX x 2 cycles to complete 6 months of adjuvant chemotherapy.  (adjuvant chemo "phase 3")      05/21/2016 Procedure    Colonoscopy by Dr. Gala Romney- The procedure was aborted due to inadequate bowel prep. Patient readily admits to eating at least applesauce yesterday which was in conflict with our written and verbal preparation instructions. No specimens collected.      06/19/2016 Imaging    CT abd/pelvis- Postsurgical changes of the rectum. New presacral soft tissue thickening likely post treatment related. Recommend attention on followup. Large amount of stool in the colon compatible with constipation. No evidence for distant metastatic dz.       Imaging    CT abd/pelvis: IMPRESSION: Stable exam. No evidence of recurrent for metastatic carcinoma within the abdomen or pelvis.  Mild decrease in large colonic stool burden since prior study. No acute findings        HISTORY OF PRESENT ILLNESS:  As per Dr. Donald Pore note on 08/13/2016: "[name] 56 y.o. male is here for follow-up of his rectal cancer. He has undergone definitive surgical therapy and did remarkably well. Unfortunately he did have  one lymph node that contained metastatic disease. CT imaging prior to surgery revealed no evidence of metastatic disease. He has completed all recommended therapy. He presents today to review recent imaging. "   INTERVAL HISTORY:  [name] presents to the clinic today unaccompanied for routine follow-up for his history of rectal cancer.  I personally reviewed and went over labs with the patient.   Patient states that he has been doing well since his last visit for his chronic low back pain from a pinched nerve. She denies any changes in his bowel movements or any GI bleeding. He denies any chest pain, shortness breath, abdominal pain, focal  weakness.  REVIEW OF SYSTEMS:  Review of Systems  Constitutional: Negative.  Negative for chills and fever.       Appetite is good  HENT:  Negative.   Eyes: Negative.   Respiratory: Negative.  Negative for cough and shortness of breath.   Cardiovascular: Negative.  Negative for chest pain and palpitations.  Gastrointestinal: Negative.  Negative for abdominal pain, blood in stool, diarrhea, nausea and vomiting.       Denies melena; denies stool incontinence   Endocrine: Negative.   Genitourinary: Negative.  Negative for bladder incontinence, dysuria and hematuria.        Occasional suprapubic pain; none at present.   Musculoskeletal: Positive for arthralgias (chronic; unchanged).  Skin: Negative.   Neurological: Negative.  Negative for dizziness and headaches.  Hematological: Negative.  Negative for adenopathy.  Psychiatric/Behavioral: Negative.  Negative for sleep disturbance.  All other systems reviewed and are negative.    PAST MEDICAL/SURGICAL HISTORY:  Past Medical History:  Diagnosis Date  . Arthritis   . Cancer ([place])    rectal  . Depression   . Diabetes mellitus    "Borderline"  . GERD (gastroesophageal reflux disease)    No weakness  . High cholesterol   . Hypertension   . Neuropathy    "back missed up"  . Rectal cancer ([place])   . Sleep apnea    Past Surgical History:  Procedure Laterality Date  . BACK SURGERY    . BIOPSY  03/31/2015   Procedure: BIOPSY;  Surgeon: Daneil Dolin, MD;  Location: [facility];  Service: Endoscopy;;  . CHOLECYSTECTOMY    . COLONOSCOPY WITH PROPOFOL N/A 03/31/2015   Procedure: COLONOSCOPY WITH PROPOFOL at cecum 0842; withdrawal time=66mnutes;  Surgeon: RDaneil Dolin MD;  Location: [facility];  Service: Endoscopy;  Laterality: N/A;  . COLONOSCOPY WITH PROPOFOL N/A 05/21/2016   Procedure: COLONOSCOPY WITH PROPOFOL;  Surgeon: RDaneil Dolin MD;  Location: [facility];  Service: Endoscopy;  Laterality: N/A;  200 - moved to 12:45 - office  calling pt with 11:15 arrival time  . COLONOSCOPY WITH PROPOFOL N/A 07/26/2016   Procedure: COLONOSCOPY WITH PROPOFOL;  Surgeon: RDaneil Dolin MD;  Location: [facility];  Service: Endoscopy;  Laterality: N/A;  115  . FLEXIBLE SIGMOIDOSCOPY N/A 07/05/2015   Procedure: FLEXIBLE SIGMOIDOSCOPY;  Surgeon: ALeighton Ruff MD;  Location: [facility];  Service: Endoscopy;  Laterality: N/A;  with tattoo  . POLYPECTOMY  03/31/2015   Procedure: POLYPECTOMY;  Surgeon: RDaneil Dolin MD;  Location: [facility];  Service: Endoscopy;;  . PORTACATH PLACEMENT N/A 08/09/2015   Procedure: INSERTION PORT-A-CATH LEFT SUBCLAVIAN;  Surgeon: ALeighton Ruff MD;  [facility]  Service: General;  Laterality: N/A;  . XI ROBOTIC ASSISTED LOWER ANTERIOR RESECTION N/A 07/06/2015   Procedure: XI ROBOTIC ASSISTED LOWER ANTERIOR RESECTION, rigid proctoscopy;  Surgeon: [name], MD;  Location: [facility];  Service: General;  Laterality: N/A;     SOCIAL HISTORY:  Social History   Social History  . Marital status: Married    Spouse name: N/A  . Number of children: N/A  . Years of education: N/A   Occupational History  . Not on file.   Social History Main Topics  . Smoking status: Former Smoker    Packs/day: 0.25    Years: 2.00    Types: Cigarettes    Quit date: 03/23/2006  . Smokeless tobacco: Never Used  . Alcohol use No  . Drug use: No  . Sexual activity: Not Currently   Other Topics Concern  . Not on file   Social History Narrative  . No narrative on file    FAMILY HISTORY:  Family History  Problem Relation Age of Onset  . Hypertension Unknown        "Not sure who, I don't know much about my family"  . Diabetes Unknown        "Not sure who, I don't know much about my family"  . Colon cancer Neg Hx        "Not that I know of"  . Colon polyps Neg Hx        "not that I know of"    CURRENT MEDICATIONS:  Outpatient Encounter Prescriptions as of 06/25/2017  Medication Sig  . ACCU-CHEK AVIVA PLUS test  strip   . amLODipine (NORVASC) 5 MG tablet Take 1 tablet (5 mg total) by mouth every morning.  . ASSURE COMFORT LANCETS 30G MISC   . Blood Glucose Calibration (ACCU-CHEK AVIVA) SOLN   . cephALEXin (KEFLEX) 500 MG capsule Take 1 capsule (500 mg total) by mouth 4 (four) times daily. For 7 days  . gabapentin (NEURONTIN) 800 MG tablet Take 1 tablet (800 mg total) by mouth 3 (three) times daily.  Marland Kitchen HYDROcodone-acetaminophen (NORCO) 7.5-325 MG tablet Take 1 tablet by mouth every 12 (twelve) hours as needed for moderate pain.  Marland Kitchen lisinopril (PRINIVIL,ZESTRIL) 40 MG tablet Take 1 tablet (40 mg total) by mouth every morning.  . NON FORMULARY PT HAS A C-PAP MACHINE  . nortriptyline (PAMELOR) 75 MG capsule Take 1 capsule (75 mg total) by mouth at bedtime.  . polyethylene glycol-electrolytes (TRILYTE) 420 g solution Take 4,000 mLs by mouth as directed.  Marland Kitchen tiZANidine (ZANAFLEX) 2 MG tablet Take 1 tablet (2 mg total) by mouth every 8 (eight) hours as needed for muscle spasms.  . traMADol (ULTRAM) 50 MG tablet May take 1-2 tabs every 6 hours as needed for pain   No facility-administered encounter medications on file as of 06/25/2017.     ALLERGIES:  No Known Allergies     PHYSICAL EXAM:  ECOG Performance status: 1 - Symptomatic, but independent  Vitals:   06/25/17 1418  BP: (!) 130/92  Pulse: 96  Resp: 16  Temp: 98.6 F (37 C)   Filed Weights   06/25/17 1418  Weight: 209 lb (94.8 kg)    Physical Exam  Constitutional: He is oriented to person, place, and time and well-developed, well-nourished, and in no distress.  HENT:  Head: Normocephalic and atraumatic.  Mouth/Throat: Oropharynx is clear and moist. No oropharyngeal exudate.  Eyes: Pupils are equal, round, and reactive to light. Conjunctivae are normal. No scleral icterus.  Neck: Normal range of motion. Neck supple.  Cardiovascular: Normal rate, regular rhythm and normal heart sounds.   Pulmonary/Chest: Effort normal and breath sounds  normal. No respiratory distress.  Abdominal: Soft. Bowel sounds are normal. He exhibits no distension and no mass. There is no tenderness.  Musculoskeletal: Normal range of motion. He exhibits no edema.  Lymphadenopathy:    He has no cervical adenopathy.       Right: No supraclavicular adenopathy present.       Left: No supraclavicular adenopathy present.  Neurological: He is alert and oriented to person, place, and time.  Ambulates with cane  Skin: Skin is warm and dry. No rash noted.  Psychiatric: Mood, memory, affect and judgment normal.  Nursing note and vitals reviewed.    LABORATORY DATA:  I have reviewed the labs as listed.  CBC    Component Value Date/Time   WBC 3.8 (L) 06/04/2017 1253   RBC 4.40 06/04/2017 1253   HGB 13.1 06/04/2017 1253   HCT 39.1 06/04/2017 1253   PLT 167 06/04/2017 1253   MCV 88.9 06/04/2017 1253   MCH 29.8 06/04/2017 1253   MCHC 33.5 06/04/2017 1253   RDW 12.6 06/04/2017 1253   LYMPHSABS 0.9 06/04/2017 1253   MONOABS 0.2 06/04/2017 1253   EOSABS 0.5 06/04/2017 1253   BASOSABS 0.0 06/04/2017 1253   CMP Latest Ref Rng & Units 06/04/2017 12/13/2016 11/24/2016  Glucose 65 - 99 mg/dL 142(H) 112(H) 100(H)  BUN 6 - 20 mg/dL _0 Creatinine 0.61 - 1.24 mg/dL 1.10 1.02 1.07  Sodium 135 - 145 mmol/L 136 138 135  Potassium 3.5 - 5.1 mmol/L 3.7 3.7 4.1  Chloride 101 - 111 mmol/L 103 105 103  CO2 22 - 32 mmol/L _1 Calcium 8.9 - 10.3 mg/dL 9.2 9.3 9.1  Total Protein 6.5 - 8.1 g/dL 7.9 7.7 -  Total Bilirubin 0.3 - 1.2 mg/dL 0.6 0.5 -  Alkaline Phos 38 - 126 U/L 76 73 -  AST 15 - 41 U/L 39 31 -  ALT 17 - 63 U/L 43 33 -   Results for Douglas, [name] (MRN [id number])   Ref. Range 12/13/2016 14:02  CEA Latest Ref Range: 0.0 - 4.7 ng/mL 4.4    PENDING LABS:    DIAGNOSTIC IMAGING:  CT abd/pelvis: 06/19/16     PROCEDURE:  Last colonoscopy: 07/26/16     PATHOLOGY:  Surgical path     ASSESSMENT & PLAN: [name] is a pleasant 56 y.o.  male with history of Stage IIIA adenocarcinoma of the rectum, diagnosed in 03/2015; treated with lower anterior resection (Dr. [name]), followed by adjuvant chemo with FOLFOX x 6 cycles, concurrent chemoradiation with 5-FU, then completed 2 additional cycles of FOLOX to complete 6 months of adjuvant chemo; completed treatment on 02/13/16. He presents to cancer center for continued follow-up.   Stage IIIA adenocarcinoma of the rectum:  -He is now 2+ years out from his original rectal cancer diagnosis .  Reviewed his restaging CT abd/pelvis with him in detail. There is no evidence of recurrence or metastatic disease. -CEA stable and normal.  Last colonoscopy done in 07/2016 with Dr. Sydell Axon with GI and was negative.  -According to NCCN Guidelines, he will be due for repeat CT imaging in 06/2018, which we will order on the next visit. (performing scans at a frequency of <12 months is considered category 2B evidence per NCCN). -He will return in 6 months for continued physical exam and labs, including CEA.   Chronic pain from low back pain: -We had originally taken over his pain management while he was on chemotherapy, however he  has been off treatment for over a year now. His pain is not related to his cancer. -I have refilled his Norco and tramadol today, however I told him from now on he has to find a pain management physician who will take over management of his narcotics. He did have a pain management physician he was going to prior to seeing [place] for his rectal cancer. Patient verbalized understanding and stated that he will work on seeing the pain management physician next month.  NCCN Guidelines reviewed for continued surveillance & Survivorship for Rectal Cancer:  Surveillance Guidelines for Rectal Cancer:    Survivorship Guidelines for Rectal Cancer:      Dispo:  -Return to cancer center in 6 months for continued follow-up.    All questions were answered to patient's stated  satisfaction. Encouraged patient to call with any new concerns or questions before his next visit to the cancer center and we can certain see him sooner, if needed.    Twana First, MD

## 2017-07-04 ENCOUNTER — Encounter: Payer: Self-pay | Admitting: Internal Medicine

## 2017-07-28 DIAGNOSIS — G4733 Obstructive sleep apnea (adult) (pediatric): Secondary | ICD-10-CM | POA: Diagnosis not present

## 2017-07-30 ENCOUNTER — Encounter (HOSPITAL_COMMUNITY): Payer: Medicare HMO

## 2017-08-05 ENCOUNTER — Encounter (HOSPITAL_COMMUNITY): Payer: Self-pay

## 2017-08-05 ENCOUNTER — Encounter (HOSPITAL_COMMUNITY): Payer: Medicare HMO | Attending: Hematology & Oncology

## 2017-08-05 VITALS — BP 102/74 | HR 100 | Temp 97.7°F | Resp 18

## 2017-08-05 DIAGNOSIS — C20 Malignant neoplasm of rectum: Secondary | ICD-10-CM | POA: Diagnosis not present

## 2017-08-05 DIAGNOSIS — Z95828 Presence of other vascular implants and grafts: Secondary | ICD-10-CM

## 2017-08-05 DIAGNOSIS — Z452 Encounter for adjustment and management of vascular access device: Secondary | ICD-10-CM | POA: Diagnosis not present

## 2017-08-05 MED ORDER — SODIUM CHLORIDE 0.9% FLUSH
10.0000 mL | INTRAVENOUS | Status: DC | PRN
  Administered 2017-08-05: 10 mL via INTRAVENOUS
  Filled 2017-08-05: qty 10

## 2017-08-05 MED ORDER — HEPARIN SOD (PORK) LOCK FLUSH 100 UNIT/ML IV SOLN
500.0000 [IU] | Freq: Once | INTRAVENOUS | Status: AC
  Administered 2017-08-05: 500 [IU] via INTRAVENOUS

## 2017-08-05 NOTE — Patient Instructions (Signed)
[place] at [facility] Discharge Instructions  RECOMMENDATIONS MADE BY THE CONSULTANT AND ANY TEST RESULTS WILL BE SENT TO YOUR REFERRING PHYSICIAN.  You had your port flushed today. Continue to get it done every 8 weeks. Follow up as scheduled.  Thank you for choosing [place] at [facility] to provide your oncology and hematology care.  To afford each patient quality time with our provider, please arrive at least 15 minutes before your scheduled appointment time.    If you have a lab appointment with the [facility] please come in thru the  Main Entrance and check in at the main information desk  You need to re-schedule your appointment should you arrive 10 or more minutes late.  We strive to give you quality time with our providers, and arriving late affects you and other patients whose appointments are after yours.  Also, if you no show three or more times for appointments you may be dismissed from the clinic at the providers discretion.     Again, thank you for choosing [facility].  Our hope is that these requests will decrease the amount of time that you wait before being seen by our physicians.       _____________________________________________________________  Should you have questions after your visit to [facility], please contact our office at [phone] between the hours of 8:30 a.m. and 4:30 p.m.  Voicemails left after 4:30 p.m. will not be returned until the following business day.  For prescription refill requests, have your pharmacy contact our office.       Resources For Cancer Patients and their Caregivers ? American Cancer Society: Can assist with transportation, wigs, general needs, runs Look Good Feel Better.        930-491-2855 ? Cancer Care: Provides financial assistance, online support groups, medication/co-pay assistance.  1-800-813-HOPE (707)215-4236) ? [facility] Assists [place] cancer patients and their families through emotional , educational and financial support.  434-236-8362 ? [place] DSS Where to apply for food stamps, Medicaid and utility assistance. 979 822 2231 ? RCATS: Transportation to medical appointments. 804-135-4988 ? Social Security Administration: May apply for disability if have a Stage IV cancer. (650) 526-8764 601 524 1306 ? [employer], Disability and Transit Services: Assists with nutrition, care and transit needs. [place] Support Programs: @10RELATIVEDAYS @ > Cancer Support Group  2nd Tuesday of the month 1pm-2pm, Journey Room  > Creative Journey  3rd Tuesday of the month 1130am-1pm, Journey Room  > Look Good Feel Better  1st Wednesday of the month 10am-12 noon, Journey Room (Call [place] to register [phone])

## 2017-08-05 NOTE — Progress Notes (Signed)
[name] presented for Portacath access and flush. Portacath located left chest wall accessed with  H 20 needle. Good blood return present. Portacath flushed with 26ml NS and 500U/51ml Heparin and needle removed intact. Procedure without incident. Patient tolerated procedure well. Patient tolerated treatment without incidence. Patient discharged ambulatory and in stable condition from clinic. Patient to follow up as scheduled.

## 2017-08-06 ENCOUNTER — Encounter (HOSPITAL_COMMUNITY): Payer: Medicare HMO

## 2017-08-14 ENCOUNTER — Ambulatory Visit (INDEPENDENT_AMBULATORY_CARE_PROVIDER_SITE_OTHER): Payer: Medicare HMO | Admitting: Urology

## 2017-08-14 DIAGNOSIS — R310 Gross hematuria: Secondary | ICD-10-CM | POA: Diagnosis not present

## 2017-08-14 DIAGNOSIS — R351 Nocturia: Secondary | ICD-10-CM | POA: Diagnosis not present

## 2017-08-22 ENCOUNTER — Ambulatory Visit: Payer: Self-pay | Admitting: General Surgery

## 2017-08-30 DIAGNOSIS — I10 Essential (primary) hypertension: Secondary | ICD-10-CM | POA: Diagnosis not present

## 2017-08-30 DIAGNOSIS — R7303 Prediabetes: Secondary | ICD-10-CM | POA: Diagnosis not present

## 2017-08-30 DIAGNOSIS — F339 Major depressive disorder, recurrent, unspecified: Secondary | ICD-10-CM | POA: Diagnosis not present

## 2017-08-30 DIAGNOSIS — G4733 Obstructive sleep apnea (adult) (pediatric): Secondary | ICD-10-CM | POA: Diagnosis not present

## 2017-08-30 DIAGNOSIS — C20 Malignant neoplasm of rectum: Secondary | ICD-10-CM | POA: Diagnosis not present

## 2017-09-05 ENCOUNTER — Ambulatory Visit: Payer: Self-pay | Admitting: General Surgery

## 2017-09-30 ENCOUNTER — Encounter (HOSPITAL_COMMUNITY): Payer: Medicare HMO

## 2017-12-05 ENCOUNTER — Encounter (HOSPITAL_COMMUNITY): Payer: Self-pay

## 2017-12-05 ENCOUNTER — Inpatient Hospital Stay (HOSPITAL_COMMUNITY): Payer: Medicare HMO | Attending: Internal Medicine

## 2017-12-05 VITALS — BP 128/86 | HR 102 | Temp 98.4°F | Resp 20

## 2017-12-05 DIAGNOSIS — C20 Malignant neoplasm of rectum: Secondary | ICD-10-CM | POA: Diagnosis not present

## 2017-12-05 DIAGNOSIS — Z85048 Personal history of other malignant neoplasm of rectum, rectosigmoid junction, and anus: Secondary | ICD-10-CM

## 2017-12-05 DIAGNOSIS — Z452 Encounter for adjustment and management of vascular access device: Secondary | ICD-10-CM | POA: Insufficient documentation

## 2017-12-05 DIAGNOSIS — Z85038 Personal history of other malignant neoplasm of large intestine: Secondary | ICD-10-CM

## 2017-12-05 MED ORDER — SODIUM CHLORIDE 0.9% FLUSH
10.0000 mL | Freq: Once | INTRAVENOUS | Status: AC
  Administered 2017-12-05: 10 mL via INTRAVENOUS

## 2017-12-05 MED ORDER — HEPARIN SOD (PORK) LOCK FLUSH 100 UNIT/ML IV SOLN
500.0000 [IU] | Freq: Once | INTRAVENOUS | Status: AC
  Administered 2017-12-05: 500 [IU] via INTRAVENOUS

## 2017-12-05 NOTE — Progress Notes (Signed)
Port flushed per protocol.  Port site clean and dry with no bruising or swelling noted at site.  Band aid applied.  VSS with discharge and left ambulatory with no s/s of distress noted.   

## 2017-12-05 NOTE — Patient Instructions (Signed)
[facility] at [facility]  Discharge Instructions:  Your port was flushed today. _______________________________________________________________  Thank you for choosing [facility] at [facility] to provide your oncology and hematology care.  To afford each patient quality time with our providers, please arrive at least 15 minutes before your scheduled appointment.  You need to re-schedule your appointment if you arrive 10 or more minutes late.  We strive to give you quality time with our providers, and arriving late affects you and other patients whose appointments are after yours.  Also, if you no show three or more times for appointments you may be dismissed from the clinic.  Again, thank you for choosing [facility] at [facility]. Our hope is that these requests will allow you access to exceptional care and in a timely manner. _______________________________________________________________  If you have questions after your visit, please contact our office at [phone] between the hours of 8:30 a.m. and 5:00 p.m. Voicemails left after 4:30 p.m. will not be returned until the following business day. _______________________________________________________________  For prescription refill requests, have your pharmacy contact our office. _______________________________________________________________  Recommendations made by the consultant and any test results will be sent to your referring physician. _______________________________________________________________ 

## 2017-12-26 ENCOUNTER — Inpatient Hospital Stay (HOSPITAL_COMMUNITY): Payer: Medicare HMO | Attending: Internal Medicine | Admitting: Internal Medicine

## 2017-12-26 ENCOUNTER — Other Ambulatory Visit: Payer: Self-pay

## 2017-12-26 ENCOUNTER — Encounter (HOSPITAL_COMMUNITY): Payer: Self-pay | Admitting: Internal Medicine

## 2017-12-26 ENCOUNTER — Inpatient Hospital Stay (HOSPITAL_COMMUNITY): Payer: Medicare HMO

## 2017-12-26 VITALS — BP 128/81 | HR 86 | Temp 98.2°F | Resp 20 | Ht 62.0 in | Wt 206.0 lb

## 2017-12-26 DIAGNOSIS — K219 Gastro-esophageal reflux disease without esophagitis: Secondary | ICD-10-CM | POA: Insufficient documentation

## 2017-12-26 DIAGNOSIS — I10 Essential (primary) hypertension: Secondary | ICD-10-CM | POA: Diagnosis not present

## 2017-12-26 DIAGNOSIS — E119 Type 2 diabetes mellitus without complications: Secondary | ICD-10-CM | POA: Diagnosis not present

## 2017-12-26 DIAGNOSIS — E7800 Pure hypercholesterolemia, unspecified: Secondary | ICD-10-CM | POA: Insufficient documentation

## 2017-12-26 DIAGNOSIS — Z9221 Personal history of antineoplastic chemotherapy: Secondary | ICD-10-CM | POA: Diagnosis not present

## 2017-12-26 DIAGNOSIS — Z923 Personal history of irradiation: Secondary | ICD-10-CM

## 2017-12-26 DIAGNOSIS — C20 Malignant neoplasm of rectum: Secondary | ICD-10-CM | POA: Diagnosis not present

## 2017-12-26 DIAGNOSIS — M1990 Unspecified osteoarthritis, unspecified site: Secondary | ICD-10-CM | POA: Insufficient documentation

## 2017-12-26 LAB — CBC WITH DIFFERENTIAL/PLATELET
BASOS ABS: 0 10*3/uL (ref 0.0–0.1)
BASOS PCT: 1 %
Eosinophils Absolute: 0.4 10*3/uL (ref 0.0–0.7)
Eosinophils Relative: 9 %
HEMATOCRIT: 42.1 % (ref 39.0–52.0)
Hemoglobin: 13.7 g/dL (ref 13.0–17.0)
LYMPHS PCT: 26 %
Lymphs Abs: 1.1 10*3/uL (ref 0.7–4.0)
MCH: 28.8 pg (ref 26.0–34.0)
MCHC: 32.5 g/dL (ref 30.0–36.0)
MCV: 88.4 fL (ref 78.0–100.0)
Monocytes Absolute: 0.4 10*3/uL (ref 0.1–1.0)
Monocytes Relative: 10 %
NEUTROS PCT: 54 %
Neutro Abs: 2.3 10*3/uL (ref 1.7–7.7)
Platelets: 176 10*3/uL (ref 150–400)
RBC: 4.76 MIL/uL (ref 4.22–5.81)
RDW: 12.8 % (ref 11.5–15.5)
WBC: 4.1 10*3/uL (ref 4.0–10.5)

## 2017-12-26 LAB — COMPREHENSIVE METABOLIC PANEL
ALT: 39 U/L (ref 17–63)
AST: 34 U/L (ref 15–41)
Albumin: 4.2 g/dL (ref 3.5–5.0)
Alkaline Phosphatase: 77 U/L (ref 38–126)
Anion gap: 9 (ref 5–15)
BILIRUBIN TOTAL: 0.3 mg/dL (ref 0.3–1.2)
BUN: 11 mg/dL (ref 6–20)
CHLORIDE: 106 mmol/L (ref 101–111)
CO2: 23 mmol/L (ref 22–32)
CREATININE: 1.06 mg/dL (ref 0.61–1.24)
Calcium: 9.3 mg/dL (ref 8.9–10.3)
GFR calc Af Amer: >60 mL/min (ref >60)
GLUCOSE: 80 mg/dL (ref 65–99)
POTASSIUM: 3.8 mmol/L (ref 3.5–5.1)
Sodium: 138 mmol/L (ref 135–145)
Total Protein: 8.4 g/dL — ABNORMAL HIGH (ref 6.5–8.1)

## 2017-12-26 NOTE — Progress Notes (Addendum)
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  HISTORY OF PRESENT ILLNESS:Mr.Careyis a pleasant 57 y.o.male with history of Stage IIIA adenocarcinoma of the rectum, diagnosed in 03/2015; treated with lower anterior resection (Dr. [name]), followed by adjuvant chemo with FOLFOX x 6 cycles, concurrent chemoradiation with 5-FU, then completed 2 additional cycles of FOLOX to complete 6 months of adjuvant chemo; completed treatment on 02/13/16.   INTERVAL HISTORY: Patient states he has been doing well he has chronic back pain which is stable he denies any hematochezia melena.  No nausea or vomiting or abdominal pain.  His bowel movements are regular without any significant change.  He does ambulate using a cane due to back pain that radiates to the left leg. His appetite is great.  His weight is stable.  All other systems review is negative.  Past Medical History:  Diagnosis Date  . Arthritis   . Cancer ([place])    rectal  . Depression   . Diabetes mellitus    "Borderline"  . GERD (gastroesophageal reflux disease)    No weakness  . High cholesterol   . Hypertension   . Neuropathy    "back missed up"  . Rectal cancer ([place])   . Sleep apnea      Physical Exam  Constitutional: He is oriented to person, place, and time. He appears well-developed and well-nourished. No distress.  HENT:  Head: Normocephalic and atraumatic.  Eyes: Conjunctivae and EOM are normal. Pupils are equal, round, and reactive to light. No scleral icterus.  Neck: Normal range of motion. Neck supple. No tracheal deviation present. No thyromegaly present.  Cardiovascular: Normal rate and regular rhythm.  Pulmonary/Chest: Effort normal. No stridor. No respiratory distress.  Abdominal: Soft. Bowel sounds are normal. He exhibits no distension and no mass. There is no tenderness. There is no guarding.  Musculoskeletal: He  exhibits no edema or deformity.  Neurological: He is alert and oriented to person, place, and time. No cranial nerve deficit.  Skin: No rash noted. No erythema. No pallor.  Psychiatric: He has a normal mood and affect. His behavior is normal. Judgment and thought content normal.  Nursing note and vitals reviewed.   He CMP today is normal CBC is normal.  CEA is 5.1 stable when compared to last year's CEA of 5. His current medications have been reviewed.  He takes hydrocodone every 12 hours as needed for back pain and tramadol.  He is also on gabapentin.  Patient states he has not been able to get refills on his pain meds since he had been discharged from the pain clinic for noncompliance in the past.  I asked him to talk to his primary care, but he requests that we make a new referral.  I recommended that he see an interventional pain specialist to discuss non-opioid options for pain such as epidural injections.   I told him that we will not be able to prescribe his pain regimen here at the cancer center.  Patient's last colonoscopy was in September 2017 by Dr. Sydell Axon, when 10 mm polyp was resected in the ascending colon.  I was unable to find surgical pathology report on this polyp.  But clearly his report states that the patient needs a surveillance colonoscopy in 1 year.  Which would have been due in September 2018.  Patient is unaware of that . We will contact GI to schedule a repeat colonoscopy ASAP.  Patient knows that compliance with surveillance appointments is important to be able to identify a recurrence early on and is critical for successful outcome.  We also reviewed his most recent CT scan of the abdomen pelvis from June 21, 2017 which did not show any evidence of disease recurrence.  He did not have it imaging of his chest at that time. With his next follow-up CT scan for August 2019 I have ordered a CT chest abdomen and pelvis in keeping with the [place] and surveillance guidelines.  He will  return for follow-up in 6 months shortly after the CT scan. Patient knows to contact us for any new problems in the interval.  ASSESSMENT AND PLAN:   Orders Placed This Encounter  Procedures  . CT Chest W Contrast  . CT Abdomen Pelvis W Contrast     He presents to cancer center for continued follow-up.

## 2017-12-27 LAB — CEA: CEA: 5.1 ng/mL — ABNORMAL HIGH (ref 0.0–4.7)

## 2017-12-30 ENCOUNTER — Encounter: Payer: Self-pay | Admitting: Internal Medicine

## 2017-12-31 ENCOUNTER — Encounter (HOSPITAL_COMMUNITY): Payer: Self-pay | Admitting: Internal Medicine

## 2017-12-31 NOTE — Progress Notes (Signed)
Faxed referral and patient records to [facility] for pain management [phone].

## 2018-01-30 ENCOUNTER — Encounter (HOSPITAL_COMMUNITY): Payer: Self-pay

## 2018-01-30 ENCOUNTER — Inpatient Hospital Stay (HOSPITAL_COMMUNITY): Payer: Medicare HMO | Attending: Internal Medicine

## 2018-01-30 VITALS — BP 120/91 | HR 95 | Temp 98.0°F | Resp 20

## 2018-01-30 DIAGNOSIS — Z923 Personal history of irradiation: Secondary | ICD-10-CM | POA: Insufficient documentation

## 2018-01-30 DIAGNOSIS — C20 Malignant neoplasm of rectum: Secondary | ICD-10-CM | POA: Diagnosis not present

## 2018-01-30 DIAGNOSIS — Z9221 Personal history of antineoplastic chemotherapy: Secondary | ICD-10-CM | POA: Diagnosis not present

## 2018-01-30 DIAGNOSIS — Z85048 Personal history of other malignant neoplasm of rectum, rectosigmoid junction, and anus: Secondary | ICD-10-CM

## 2018-01-30 MED ORDER — SODIUM CHLORIDE 0.9% FLUSH
10.0000 mL | Freq: Once | INTRAVENOUS | Status: AC
  Administered 2018-01-30: 10 mL via INTRAVENOUS

## 2018-01-30 MED ORDER — HEPARIN SOD (PORK) LOCK FLUSH 100 UNIT/ML IV SOLN
500.0000 [IU] | Freq: Once | INTRAVENOUS | Status: AC
  Administered 2018-01-30: 500 [IU] via INTRAVENOUS

## 2018-01-30 NOTE — Progress Notes (Signed)
Patient for port flush.  Port site clean and dry with no bruising or swelling noted at site.  No complaints of pain with flush.  Band aid applied. VSS with discharge and left ambulatory with no s/s of distress noted.  Patient scheduled for pain clinic management due to left leg pain.  He was referred by the cancer center.  He is asking if he can have a refill on his hydrocodone until the appointment.  Reviewed medications and he is taking as directed.  He is taking the tramadol 2 tabs every 6 hours as directed but is concerned with the amount of tabs.  Stated he gets better relief with hydrocodone.  Instructed the patient to call the pain clinic to see if he can get in sooner and continue the tramadol as directed.  Verbalized understanding.

## 2018-01-30 NOTE — Patient Instructions (Signed)
[facility] at [facility]  Discharge Instructions:  Your port was flushed today. _______________________________________________________________  Thank you for choosing [facility] at [facility] to provide your oncology and hematology care.  To afford each patient quality time with our providers, please arrive at least 15 minutes before your scheduled appointment.  You need to re-schedule your appointment if you arrive 10 or more minutes late.  We strive to give you quality time with our providers, and arriving late affects you and other patients whose appointments are after yours.  Also, if you no show three or more times for appointments you may be dismissed from the clinic.  Again, thank you for choosing [facility] at [facility]. Our hope is that these requests will allow you access to exceptional care and in a timely manner. _______________________________________________________________  If you have questions after your visit, please contact our office at [phone] between the hours of 8:30 a.m. and 5:00 p.m. Voicemails left after 4:30 p.m. will not be returned until the following business day. _______________________________________________________________  For prescription refill requests, have your pharmacy contact our office. _______________________________________________________________  Recommendations made by the consultant and any test results will be sent to your referring physician. _______________________________________________________________ 

## 2018-02-19 ENCOUNTER — Ambulatory Visit: Payer: Medicare HMO | Admitting: Nurse Practitioner

## 2018-03-06 ENCOUNTER — Encounter: Payer: Self-pay | Admitting: Nurse Practitioner

## 2018-03-06 ENCOUNTER — Ambulatory Visit (INDEPENDENT_AMBULATORY_CARE_PROVIDER_SITE_OTHER): Payer: Medicare HMO | Admitting: Nurse Practitioner

## 2018-03-06 ENCOUNTER — Telehealth: Payer: Self-pay

## 2018-03-06 ENCOUNTER — Other Ambulatory Visit: Payer: Self-pay

## 2018-03-06 VITALS — BP 118/78 | HR 92 | Temp 97.9°F | Ht 62.0 in | Wt 205.8 lb

## 2018-03-06 DIAGNOSIS — Z85048 Personal history of other malignant neoplasm of rectum, rectosigmoid junction, and anus: Secondary | ICD-10-CM

## 2018-03-06 DIAGNOSIS — R69 Illness, unspecified: Secondary | ICD-10-CM | POA: Diagnosis not present

## 2018-03-06 MED ORDER — PEG 3350-KCL-NA BICARB-NACL 420 G PO SOLR: 4000.0000 mL | ORAL | 0 refills | Status: DC

## 2018-03-06 NOTE — Progress Notes (Signed)
CC'D TO PCP °

## 2018-03-06 NOTE — Assessment & Plan Note (Signed)
The patient was brought in for office visit prior to surveillance colonoscopy due to history of rectal cancer as well as polypharmacy requiring augmented sedation on propofol/MAC.  We will plan for 1 year post cancer diagnosis colonoscopy as per below.  Follow-up based on post procedure recommendations.

## 2018-03-06 NOTE — Patient Instructions (Signed)
1. We will schedule your colonoscopy for you. 2. Further recommendations will be made after your colonoscopy. 3. Return for follow-up based on the recommendations made after your colonoscopy. 4. Call us if you have any questions or concerns.   It was great to see you today!  I hope you have a wonderful summer!!!    At [facility] we value your feedback. You may receive a survey about your visit today. Please share your experience as we strive to create trusting relationships with our patients to provide genuine, compassionate, quality care.

## 2018-03-06 NOTE — Assessment & Plan Note (Signed)
History of rectal cancer now due for 1 year post cancer diagnosis colonoscopy.  He is generally asymptomatic from a GI standpoint other than change in bowel movements to be more regular after cholecystectomy.  At this point we will proceed with recommended surveillance colonoscopy.  Return for follow-up based on post procedure recommendations.  Proceed with TCS on propofol/MAC with Dr. Gala Romney in near future: the risks, benefits, and alternatives have been discussed with the patient in detail. The patient states understanding and desires to proceed.  Patient is currently on Neurontin, Norco, Ultram.  No other anticoagulants, anxiolytics, chronic pain medications, or antidepressants.  We will plan for the procedure on propofol/MAC to promote adequate sedation.

## 2018-03-06 NOTE — Progress Notes (Signed)
Referring Provider: Rosita Fire, MD Primary Care Physician:  Rosita Fire, MD Primary GI:  Dr. Gala Romney  Chief Complaint  Patient presents with  . Consult    TCS    HPI:   [name] is a 57 y.o. male who presents to schedule colonoscopy.  Phone/nurse triage was deferred to office visit due to polypharmacy and likely need for augmented sedation with propofol/MAC.  The patient does have a personal history of rectal cancer.  Last colonoscopy completed 07/26/2016 which found diverticulosis of the sigmoid colon, a single 10 mm polyp of the hepatic flexure, otherwise normal.  No surgical pathology report was found.  He is followed by oncology for history of rectal cancer.  Underwent low anterior resection. Also had chemotherapy/radiation. Most recent oncology office visit dated 12/26/2017.  Completed treatment 02/13/2016.  At that time he indicated he was doing well.  It was noted he is overdue for repeat colonoscopy which was recommended to be completed in September 2018.  Most recent CT scan 06/21/2017 did not show recurrence of disease, he is scheduled for another CT in August 2019. Still has port in place.  Today he states he's doing well overall. Denies abdominal pain, N/V, hematochezia, melena, fever, chills, unintentional weight loss. Has a good appetite. Has regular bowel movements since cholecystectomy. Denies chest pain, dyspnea, dizziness, lightheadedness, syncope, near syncope. Denies any other upper or lower GI symptoms.  Past Medical History:  Diagnosis Date  . Arthritis   . Cancer ([place])    rectal  . Depression   . Diabetes mellitus    "Borderline"  . GERD (gastroesophageal reflux disease)    No weakness  . High cholesterol   . Hypertension   . Neuropathy    "back missed up"  . Rectal cancer ([place])   . Sleep apnea     Past Surgical History:  Procedure Laterality Date  . BACK SURGERY    . BIOPSY  03/31/2015   Procedure: BIOPSY;  Surgeon: Daneil Dolin, MD;  Location:  [facility];  Service: Endoscopy;;  . CHOLECYSTECTOMY    . COLONOSCOPY WITH PROPOFOL N/A 03/31/2015   Procedure: COLONOSCOPY WITH PROPOFOL at cecum 0842; withdrawal time=73mnutes;  Surgeon: RDaneil Dolin MD;  Location: [facility];  Service: Endoscopy;  Laterality: N/A;  . COLONOSCOPY WITH PROPOFOL N/A 05/21/2016   Procedure: COLONOSCOPY WITH PROPOFOL;  Surgeon: RDaneil Dolin MD;  Location: [facility];  Service: Endoscopy;  Laterality: N/A;  200 - moved to 12:45 - office calling pt with 11:15 arrival time  . COLONOSCOPY WITH PROPOFOL N/A 07/26/2016   Procedure: COLONOSCOPY WITH PROPOFOL;  Surgeon: RDaneil Dolin MD;  Location: [facility];  Service: Endoscopy;  Laterality: N/A;  115  . FLEXIBLE SIGMOIDOSCOPY N/A 07/05/2015   Procedure: FLEXIBLE SIGMOIDOSCOPY;  Surgeon: ALeighton Ruff MD;  Location: [facility];  Service: Endoscopy;  Laterality: N/A;  with tattoo  . POLYPECTOMY  03/31/2015   Procedure: POLYPECTOMY;  Surgeon: RDaneil Dolin MD;  Location: [facility];  Service: Endoscopy;;  . PORTACATH PLACEMENT N/A 08/09/2015   Procedure: INSERTION PORT-A-CATH LEFT SUBCLAVIAN;  Surgeon: ALeighton Ruff MD;  [facility]  Service: General;  Laterality: N/A;  . XI ROBOTIC ASSISTED LOWER ANTERIOR RESECTION N/A 07/06/2015   Procedure: XI ROBOTIC ASSISTED LOWER ANTERIOR RESECTION, rigid proctoscopy;  Surgeon: ALeighton Ruff MD;  Location: [facility];  Service: General;  Laterality: N/A;    Current Outpatient Medications  Medication Sig Dispense Refill  . amLODipine (NORVASC) 10 MG  tablet Take 10 mg by mouth daily.     Marland Kitchen gabapentin (NEURONTIN) 800 MG tablet Take 1 tablet (800 mg total) by mouth 3 (three) times daily. 90 tablet 2  . HYDROcodone-acetaminophen (NORCO) 7.5-325 MG tablet Take 1 tablet by mouth every 6 (six) hours as needed for moderate pain.    Marland Kitchen lisinopril (PRINIVIL,ZESTRIL) 40 MG tablet Take 1 tablet (40 mg total) by mouth every morning. 30 tablet 2  . NON FORMULARY PT HAS A C-PAP MACHINE    .  nortriptyline (PAMELOR) 75 MG capsule Take 1 capsule (75 mg total) by mouth at bedtime. 30 capsule 2  . tiZANidine (ZANAFLEX) 2 MG tablet Take 1 tablet (2 mg total) by mouth every 8 (eight) hours as needed for muscle spasms. 30 tablet 2  . traMADol (ULTRAM) 50 MG tablet May take 1-2 tabs every 6 hours as needed for pain 120 tablet 0   No current facility-administered medications for this visit.     Allergies as of 03/06/2018  . (No Known Allergies)    Family History  Problem Relation Age of Onset  . Hypertension Unknown        "Not sure who, I don't know much about my family"  . Diabetes Unknown        "Not sure who, I don't know much about my family"  . Colon cancer Neg Hx        "Not that I know of"  . Colon polyps Neg Hx        "not that I know of"    Social History   Socioeconomic History  . Marital status: Married    Spouse name: Not on file  . Number of children: Not on file  . Years of education: Not on file  . Highest education level: Not on file  Occupational History  . Not on file  Social Needs  . Financial resource strain: Not on file  . Food insecurity:    Worry: Not on file    Inability: Not on file  . Transportation needs:    Medical: Not on file    Non-medical: Not on file  Tobacco Use  . Smoking status: Former Smoker    Packs/day: 0.25    Years: 2.00    Pack years: 0.50    Types: Cigarettes    Last attempt to quit: 03/23/2006    Years since quitting: 11.9  . Smokeless tobacco: Never Used  Substance and Sexual Activity  . Alcohol use: No    Alcohol/week: 0.0 oz  . Drug use: No  . Sexual activity: Not Currently  Lifestyle  . Physical activity:    Days per week: Not on file    Minutes per session: Not on file  . Stress: Not on file  Relationships  . Social connections:    Talks on phone: Not on file    Gets together: Not on file    Attends religious service: Not on file    Active member of club or organization: Not on file    Attends  meetings of clubs or organizations: Not on file    Relationship status: Not on file  Other Topics Concern  . Not on file  Social History Narrative  . Not on file    Review of Systems: General: Negative for anorexia, weight loss, fever, chills, fatigue, weakness. ENT: Negative for hoarseness, difficulty swallowing. CV: Negative for chest pain, angina, palpitations, peripheral edema.  Respiratory: Negative for dyspnea at rest, cough, sputum, wheezing.  GI: See history of present illness. Endo: Negative for unusual weight change.  Heme: Negative for bruising or bleeding.   Physical Exam: BP 118/78   Pulse 92   Temp 97.9 F (36.6 C) (Oral)   Ht 5' 2"  (1.575 m)   Wt 205 lb 12.8 oz (93.4 kg)   BMI 37.64 kg/m  General:   Alert and oriented. Pleasant and cooperative. Well-nourished and well-developed.  Eyes:  Without icterus, sclera clear and conjunctiva pink.  Ears:  Normal auditory acuity. Cardiovascular:  S1, S2 present without murmurs appreciated. Extremities without clubbing or edema. Respiratory:  Clear to auscultation bilaterally. No wheezes, rales, or rhonchi. No distress.  Gastrointestinal:  +BS, soft, non-tender and non-distended. No HSM noted. No guarding or rebound. No masses appreciated.  Rectal:  Deferred  Musculoskalatal:  Symmetrical without gross deformities.  Skin:  Intact without significant lesions or rashes. Neurologic:  Alert and oriented x4;  grossly normal neurologically. Psych:  Alert and cooperative. Normal mood and affect. Heme/Lymph/Immune: No excessive bruising noted.    03/06/2018 11:04 AM   Disclaimer: This note was dictated with voice recognition software. Similar sounding words can inadvertently be transcribed and may not be corrected upon review.

## 2018-03-06 NOTE — Telephone Encounter (Signed)
Tried to call pt to inform of pre-op appt 05/02/18 at 10:00am, no answer, LMOVM. Letter mailed.

## 2018-03-07 DIAGNOSIS — E785 Hyperlipidemia, unspecified: Secondary | ICD-10-CM | POA: Diagnosis not present

## 2018-03-07 DIAGNOSIS — E119 Type 2 diabetes mellitus without complications: Secondary | ICD-10-CM | POA: Diagnosis not present

## 2018-03-07 DIAGNOSIS — Z0000 Encounter for general adult medical examination without abnormal findings: Secondary | ICD-10-CM | POA: Diagnosis not present

## 2018-03-07 DIAGNOSIS — I10 Essential (primary) hypertension: Secondary | ICD-10-CM | POA: Diagnosis not present

## 2018-03-07 DIAGNOSIS — Z1331 Encounter for screening for depression: Secondary | ICD-10-CM | POA: Diagnosis not present

## 2018-03-07 DIAGNOSIS — G63 Polyneuropathy in diseases classified elsewhere: Secondary | ICD-10-CM | POA: Diagnosis not present

## 2018-03-07 DIAGNOSIS — Z1389 Encounter for screening for other disorder: Secondary | ICD-10-CM | POA: Diagnosis not present

## 2018-03-07 DIAGNOSIS — G4733 Obstructive sleep apnea (adult) (pediatric): Secondary | ICD-10-CM | POA: Diagnosis not present

## 2018-03-07 DIAGNOSIS — F339 Major depressive disorder, recurrent, unspecified: Secondary | ICD-10-CM | POA: Diagnosis not present

## 2018-04-09 ENCOUNTER — Other Ambulatory Visit: Payer: Self-pay

## 2018-04-09 ENCOUNTER — Encounter (HOSPITAL_COMMUNITY): Payer: Self-pay

## 2018-04-09 ENCOUNTER — Inpatient Hospital Stay (HOSPITAL_COMMUNITY): Payer: Medicare HMO | Attending: Internal Medicine

## 2018-04-09 DIAGNOSIS — Z452 Encounter for adjustment and management of vascular access device: Secondary | ICD-10-CM | POA: Insufficient documentation

## 2018-04-09 DIAGNOSIS — C20 Malignant neoplasm of rectum: Secondary | ICD-10-CM | POA: Diagnosis not present

## 2018-04-09 MED ORDER — HEPARIN SOD (PORK) LOCK FLUSH 100 UNIT/ML IV SOLN
500.0000 [IU] | Freq: Once | INTRAVENOUS | Status: AC
  Administered 2018-04-09: 500 [IU] via INTRAVENOUS

## 2018-04-09 MED ORDER — HEPARIN SOD (PORK) LOCK FLUSH 100 UNIT/ML IV SOLN
INTRAVENOUS | Status: AC
  Filled 2018-04-09: qty 5

## 2018-04-09 MED ORDER — SODIUM CHLORIDE 0.9% FLUSH
10.0000 mL | INTRAVENOUS | Status: DC | PRN
  Administered 2018-04-09: 10 mL via INTRAVENOUS
  Filled 2018-04-09: qty 10

## 2018-04-09 NOTE — Progress Notes (Signed)
Pollyann Samples presented for Portacath access and flush.  Portacath located left chest wall accessed with  H 20 needle.  Good blood return present. Portacath flushed with 33ml NS and 500U/34ml Heparin and needle removed intact.  Procedure tolerated well and without incident.  Discharged ambulatory.

## 2018-04-29 NOTE — Patient Instructions (Signed)
[name]  04/29/2018     @PREFPERIOPPHARMACY @   Your procedure is scheduled on  05/08/2018   Report to [facility] at  104  A.M.  Call this number if you have problems the morning of surgery:  [phone]   Remember:  No food after midnight.  You may drink clear liquids until (follow the instructions given to you by Dr Roseanne Kaufman office.  Clear liquids allowed are:                    Water, Juice (non-citric and without pulp), Carbonated beverages, Clear Tea, Black Coffee only, Plain Jell-O only, Gatorade and Plain Popsicles only    Take these medicines the morning of surgery with A SIP OF WATER  Amlodipine, neurontin, hydrocodone, lisinopril, zanaflex, ultram.    Do not wear jewelry, make-up or nail polish.  Do not wear lotions, powders, or perfumes, or deodorant.  Do not shave 48 hours prior to surgery.  Men may shave face and neck.  Do not bring valuables to the hospital.  [facility] is not responsible for any belongings or valuables.  Contacts, dentures or bridgework may not be worn into surgery.  Leave your suitcase in the car.  After surgery it may be brought to your room.  For patients admitted to the hospital, discharge time will be determined by your treatment team.  Patients discharged the day of surgery will not be allowed to drive home.   Name and phone number of your driver:   family Special instructions:  Follow the diet and prep instructions given to you by Dr Roseanne Kaufman office.  Please read over the following fact sheets that you were given. Anesthesia Post-op Instructions and Care and Recovery After Surgery       Colonoscopy, Adult A colonoscopy is an exam to look at the large intestine. It is done to check for problems, such as:  Lumps (tumors).  Growths (polyps).  Swelling (inflammation).  Bleeding.  What happens before the procedure? Eating and drinking Follow instructions from your doctor about eating and drinking. These  instructions may include:  A few days before the procedure - follow a low-fiber diet. ? Avoid nuts. ? Avoid seeds. ? Avoid dried fruit. ? Avoid raw fruits. ? Avoid vegetables.  1-3 days before the procedure - follow a clear liquid diet. Avoid liquids that have red or purple dye. Drink only clear liquids, such as: ? Clear broth or bouillon. ? Black coffee or tea. ? Clear juice. ? Clear soft drinks or sports drinks. ? Gelatin dessert. ? Popsicles.  On the day of the procedure - do not eat or drink anything during the 2 hours before the procedure.  Bowel prep If you were prescribed an oral bowel prep:  Take it as told by your doctor. Starting the day before your procedure, you will need to drink a lot of liquid. The liquid will cause you to poop (have bowel movements) until your poop is almost clear or light green.  If your skin or butt gets irritated from diarrhea, you may: ? Wipe the area with wipes that have medicine in them, such as adult wet wipes with aloe and vitamin E. ? Put something on your skin that soothes the area, such as petroleum jelly.  If you throw up (vomit) while drinking the bowel prep, take a break for up to 60 minutes. Then begin the bowel prep again. If you keep  throwing up and you cannot take the bowel prep without throwing up, call your doctor.  General instructions  Ask your doctor about changing or stopping your normal medicines. This is important if you take diabetes medicines or blood thinners.  Plan to have someone take you home from the hospital or clinic. What happens during the procedure?  An IV tube may be put into one of your veins.  You will be given medicine to help you relax (sedative).  To reduce your risk of infection: ? Your doctors will wash their hands. ? Your anal area will be washed with soap.  You will be asked to lie on your side with your knees bent.  Your doctor will get a long, thin, flexible tube ready. The tube will have  a camera and a light on the end.  The tube will be put into your anus.  The tube will be gently put into your large intestine.  Air will be delivered into your large intestine to keep it open. You may feel some pressure or cramping.  The camera will be used to take photos.  A small tissue sample may be removed from your body to be looked at under a microscope (biopsy). If any possible problems are found, the tissue will be sent to a lab for testing.  If small growths are found, your doctor may remove them and have them checked for cancer.  The tube that was put into your anus will be slowly removed. The procedure may vary among doctors and hospitals. What happens after the procedure?  Your doctor will check on you often until the medicines you were given have worn off.  Do not drive for 24 hours after the procedure.  You may have a small amount of blood in your poop.  You may pass gas.  You may have mild cramps or bloating in your belly (abdomen).  It is up to you to get the results of your procedure. Ask your doctor, or the department performing the procedure, when your results will be ready. This information is not intended to replace advice given to you by your health care provider. Make sure you discuss any questions you have with your health care provider. Document Released: 12/08/2010 Document Revised: 09/05/2016 Document Reviewed: 01/17/2016 Elsevier Interactive Patient Education  2017 [employer]  Colonoscopy, Adult, Care After This sheet gives you information about how to care for yourself after your procedure. Your health care provider may also give you more specific instructions. If you have problems or questions, contact your health care provider. What can I expect after the procedure? After the procedure, it is common to have:  A small amount of blood in your stool for 24 hours after the procedure.  Some gas.  Mild abdominal cramping or bloating.  Follow  these instructions at home: General instructions   For the first 24 hours after the procedure: ? Do not drive or use machinery. ? Do not sign important documents. ? Do not drink alcohol. ? Do your regular daily activities at a slower pace than normal. ? Eat soft, easy-to-digest foods. ? Rest often.  Take over-the-counter or prescription medicines only as told by your health care provider.  It is up to you to get the results of your procedure. Ask your health care provider, or the department performing the procedure, when your results will be ready. Relieving cramping and bloating  Try walking around when you have cramps or feel bloated.  Apply heat to your  abdomen as told by your health care provider. Use a heat source that your health care provider recommends, such as a moist heat pack or a heating pad. ? Place a towel between your skin and the heat source. ? Leave the heat on for 20-30 minutes. ? Remove the heat if your skin turns bright red. This is especially important if you are unable to feel pain, heat, or cold. You may have a greater risk of getting burned. Eating and drinking  Drink enough fluid to keep your urine clear or pale yellow.  Resume your normal diet as instructed by your health care provider. Avoid heavy or fried foods that are hard to digest.  Avoid drinking alcohol for as long as instructed by your health care provider. Contact a health care provider if:  You have blood in your stool 2-3 days after the procedure. Get help right away if:  You have more than a small spotting of blood in your stool.  You pass large blood clots in your stool.  Your abdomen is swollen.  You have nausea or vomiting.  You have a fever.  You have increasing abdominal pain that is not relieved with medicine. This information is not intended to replace advice given to you by your health care provider. Make sure you discuss any questions you have with your health care  provider. Document Released: 06/19/2004 Document Revised: 07/30/2016 Document Reviewed: 01/17/2016 Elsevier Interactive Patient Education  2018 [place] Anesthesia is a term that refers to techniques, procedures, and medicines that help a person stay safe and comfortable during a medical procedure. Monitored anesthesia care, or sedation, is one type of anesthesia. Your anesthesia specialist may recommend sedation if you will be having a procedure that does not require you to be unconscious, such as:  Cataract surgery.  A dental procedure.  A biopsy.  A colonoscopy.  During the procedure, you may receive a medicine to help you relax (sedative). There are three levels of sedation:  Mild sedation. At this level, you may feel awake and relaxed. You will be able to follow directions.  Moderate sedation. At this level, you will be sleepy. You may not remember the procedure.  Deep sedation. At this level, you will be asleep. You will not remember the procedure.  The more medicine you are given, the deeper your level of sedation will be. Depending on how you respond to the procedure, the anesthesia specialist may change your level of sedation or the type of anesthesia to fit your needs. An anesthesia specialist will monitor you closely during the procedure. Let your health care provider know about:  Any allergies you have.  All medicines you are taking, including vitamins, herbs, eye drops, creams, and over-the-counter medicines.  Any use of steroids (by mouth or as a cream).  Any problems you or family members have had with sedatives and anesthetic medicines.  Any blood disorders you have.  Any surgeries you have had.  Any medical conditions you have, such as sleep apnea.  Whether you are pregnant or may be pregnant.  Any use of cigarettes, alcohol, or street drugs. What are the risks? Generally, this is a safe procedure. However, problems may  occur, including:  Getting too much medicine (oversedation).  Nausea.  Allergic reaction to medicines.  Trouble breathing. If this happens, a breathing tube may be used to help with breathing. It will be removed when you are awake and breathing on your own.  Heart trouble.  Lung trouble.  Before the procedure Staying hydrated Follow instructions from your health care provider about hydration, which may include:  Up to 2 hours before the procedure - you may continue to drink clear liquids, such as water, clear fruit juice, black coffee, and plain tea.  Eating and drinking restrictions Follow instructions from your health care provider about eating and drinking, which may include:  8 hours before the procedure - stop eating heavy meals or foods such as meat, fried foods, or fatty foods.  6 hours before the procedure - stop eating light meals or foods, such as toast or cereal.  6 hours before the procedure - stop drinking milk or drinks that contain milk.  2 hours before the procedure - stop drinking clear liquids.  Medicines Ask your health care provider about:  Changing or stopping your regular medicines. This is especially important if you are taking diabetes medicines or blood thinners.  Taking medicines such as aspirin and ibuprofen. These medicines can thin your blood. Do not take these medicines before your procedure if your health care provider instructs you not to.  Tests and exams  You will have a physical exam.  You may have blood tests done to show: ? How well your kidneys and liver are working. ? How well your blood can clot.  General instructions  Plan to have someone take you home from the hospital or clinic.  If you will be going home right after the procedure, plan to have someone with you for 24 hours.  What happens during the procedure?  Your blood pressure, heart rate, breathing, level of pain and overall condition will be monitored.  An IV  tube will be inserted into one of your veins.  Your anesthesia specialist will give you medicines as needed to keep you comfortable during the procedure. This may mean changing the level of sedation.  The procedure will be performed. After the procedure  Your blood pressure, heart rate, breathing rate, and blood oxygen level will be monitored until the medicines you were given have worn off.  Do not drive for 24 hours if you received a sedative.  You may: ? Feel sleepy, clumsy, or nauseous. ? Feel forgetful about what happened after the procedure. ? Have a sore throat if you had a breathing tube during the procedure. ? Vomit. This information is not intended to replace advice given to you by your health care provider. Make sure you discuss any questions you have with your health care provider. Document Released: 08/01/2005 Document Revised: 04/13/2016 Document Reviewed: 02/26/2016 Elsevier Interactive Patient Education  2018 [place], Care After These instructions provide you with information about caring for yourself after your procedure. Your health care provider may also give you more specific instructions. Your treatment has been planned according to current medical practices, but problems sometimes occur. Call your health care provider if you have any problems or questions after your procedure. What can I expect after the procedure? After your procedure, it is common to:  Feel sleepy for several hours.  Feel clumsy and have poor balance for several hours.  Feel forgetful about what happened after the procedure.  Have poor judgment for several hours.  Feel nauseous or vomit.  Have a sore throat if you had a breathing tube during the procedure.  Follow these instructions at home: For at least 24 hours after the procedure:   Do not: ? Participate in activities in which you could fall or become injured. ?  Drive. ? Use heavy  machinery. ? Drink alcohol. ? Take sleeping pills or medicines that cause drowsiness. ? Make important decisions or sign legal documents. ? Take care of children on your own.  Rest. Eating and drinking  Follow the diet that is recommended by your health care provider.  If you vomit, drink water, juice, or soup when you can drink without vomiting.  Make sure you have little or no nausea before eating solid foods. General instructions  Have a responsible adult stay with you until you are awake and alert.  Take over-the-counter and prescription medicines only as told by your health care provider.  If you smoke, do not smoke without supervision.  Keep all follow-up visits as told by your health care provider. This is important. Contact a health care provider if:  You keep feeling nauseous or you keep vomiting.  You feel light-headed.  You develop a rash.  You have a fever. Get help right away if:  You have trouble breathing. This information is not intended to replace advice given to you by your health care provider. Make sure you discuss any questions you have with your health care provider. Document Released: 02/26/2016 Document Revised: 06/27/2016 Document Reviewed: 02/26/2016 Elsevier Interactive Patient Education  [employer].

## 2018-05-02 ENCOUNTER — Encounter (HOSPITAL_COMMUNITY): Payer: Self-pay

## 2018-05-02 ENCOUNTER — Encounter (HOSPITAL_COMMUNITY)
Admission: RE | Admit: 2018-05-02 | Discharge: 2018-05-02 | Disposition: A | Discharge status: Home / Self Care | Payer: Medicare HMO | Source: Ambulatory Visit | Attending: Internal Medicine | Admitting: Internal Medicine

## 2018-05-02 NOTE — Pre-Procedure Instructions (Signed)
Patient was no show for PAT. Attempted to call office and let them know but they were closed.

## 2018-05-05 ENCOUNTER — Telehealth: Payer: Self-pay

## 2018-05-05 NOTE — Telephone Encounter (Signed)
Endo scheduler called office. Pt was no show for pre-op appt today. They tried to call pt.  I tried to call pt, call went straight to VM. LMOVM for him to call endo scheduler to reschedule pre-op or call our office he wants to cancel TCS.

## 2018-05-06 ENCOUNTER — Other Ambulatory Visit: Payer: Self-pay

## 2018-05-06 MED ORDER — PEG 3350-KCL-NA BICARB-NACL 420 G PO SOLR: 4000.0000 mL | ORAL | 0 refills | Status: DC

## 2018-05-06 NOTE — Telephone Encounter (Signed)
Called pt. Informed him he needs pre-op appt tomorrow or procedure for 05/08/18 will need to be canceled. He wants to have TCS 05/08/18. Asked him if he started clear liquids after lunch today. Phone was cutting in and out. He said he will call office back.

## 2018-05-06 NOTE — Telephone Encounter (Signed)
Rx for prep sent to pharmacy. Instructions faxed to pharmacy.

## 2018-05-06 NOTE — Telephone Encounter (Addendum)
Pt called office. Gave him instructions for clear liquids to start now. Advised of clear liquids list. Told him to go to pre-op appt tomorrow at 7:00am. Advised him to call office if he doesn't understand instructions when he picks them up. He is aware he will need fleet enema and Bisacodyl tablets in addition to prep. Verbalized understanding. Pt states he moved in May and instructions must have been misplaced.

## 2018-05-06 NOTE — Telephone Encounter (Addendum)
Pt called office. When asked what he has ate today, he said nothing. Asked him again, he states he hasn't ate anything today. Advised him he was to start clear liquids after lunch today. He doesn't have his instructions. Hasn't picked up prep. He's not at home. He will call office back for pre-op appt and clear liquids instructions. Informed him office phone turns off at 4:45pm. Told him I will send in another rx to pharmacy and fax instructions to pharmacy.  Endo scheduler said he will need pre-op tomorrow at 7:00am.

## 2018-05-07 ENCOUNTER — Other Ambulatory Visit: Payer: Self-pay

## 2018-05-07 ENCOUNTER — Encounter (HOSPITAL_COMMUNITY): Payer: Self-pay

## 2018-05-07 ENCOUNTER — Encounter (HOSPITAL_COMMUNITY)
Admission: RE | Admit: 2018-05-07 | Discharge: 2018-05-07 | Disposition: A | Discharge status: Home / Self Care | Payer: Medicare HMO | Source: Ambulatory Visit | Attending: Internal Medicine | Admitting: Internal Medicine

## 2018-05-07 DIAGNOSIS — Z5309 Procedure and treatment not carried out because of other contraindication: Secondary | ICD-10-CM | POA: Diagnosis not present

## 2018-05-07 DIAGNOSIS — Z1211 Encounter for screening for malignant neoplasm of colon: Secondary | ICD-10-CM | POA: Diagnosis not present

## 2018-05-07 DIAGNOSIS — Z85038 Personal history of other malignant neoplasm of large intestine: Secondary | ICD-10-CM | POA: Diagnosis not present

## 2018-05-07 LAB — BASIC METABOLIC PANEL
ANION GAP: 8 (ref 5–15)
BUN: 17 mg/dL (ref 6–20)
CO2: 27 mmol/L (ref 22–32)
Calcium: 9.3 mg/dL (ref 8.9–10.3)
Chloride: 103 mmol/L (ref 101–111)
Creatinine, Ser: 1.1 mg/dL (ref 0.61–1.24)
GFR calc Af Amer: >60 mL/min (ref >60)
GLUCOSE: 85 mg/dL (ref 65–99)
POTASSIUM: 3.8 mmol/L (ref 3.5–5.1)
Sodium: 138 mmol/L (ref 135–145)

## 2018-05-07 LAB — CBC WITH DIFFERENTIAL/PLATELET
BASOS ABS: 0 10*3/uL (ref 0.0–0.1)
Basophils Relative: 1 %
Eosinophils Absolute: 0.5 10*3/uL (ref 0.0–0.7)
Eosinophils Relative: 11 %
HEMATOCRIT: 40.6 % (ref 39.0–52.0)
Hemoglobin: 13.3 g/dL (ref 13.0–17.0)
LYMPHS PCT: 25 %
Lymphs Abs: 1.1 10*3/uL (ref 0.7–4.0)
MCH: 29.5 pg (ref 26.0–34.0)
MCHC: 32.8 g/dL (ref 30.0–36.0)
MCV: 90 fL (ref 78.0–100.0)
MONO ABS: 0.4 10*3/uL (ref 0.1–1.0)
Monocytes Relative: 9 %
NEUTROS ABS: 2.4 10*3/uL (ref 1.7–7.7)
Neutrophils Relative %: 54 %
Platelets: 187 10*3/uL (ref 150–400)
RBC: 4.51 MIL/uL (ref 4.22–5.81)
RDW: 12.6 % (ref 11.5–15.5)
WBC: 4.4 10*3/uL (ref 4.0–10.5)

## 2018-05-08 ENCOUNTER — Ambulatory Visit (HOSPITAL_COMMUNITY)
Admission: RE | Admit: 2018-05-08 | Discharge: 2018-05-08 | Disposition: A | Discharge status: Home / Self Care | Payer: Medicare HMO | Source: Ambulatory Visit | Attending: Internal Medicine | Admitting: Internal Medicine

## 2018-05-08 ENCOUNTER — Ambulatory Visit (HOSPITAL_COMMUNITY): Payer: Medicare HMO | Admitting: Anesthesiology

## 2018-05-08 ENCOUNTER — Telehealth: Payer: Self-pay | Admitting: Internal Medicine

## 2018-05-08 ENCOUNTER — Encounter (HOSPITAL_COMMUNITY): Payer: Self-pay | Admitting: *Deleted

## 2018-05-08 ENCOUNTER — Encounter (HOSPITAL_COMMUNITY)
Admission: RE | Disposition: A | Discharge status: Home / Self Care | Payer: Self-pay | Source: Ambulatory Visit | Attending: Internal Medicine

## 2018-05-08 DIAGNOSIS — Z85038 Personal history of other malignant neoplasm of large intestine: Secondary | ICD-10-CM | POA: Insufficient documentation

## 2018-05-08 DIAGNOSIS — Z1211 Encounter for screening for malignant neoplasm of colon: Secondary | ICD-10-CM | POA: Diagnosis not present

## 2018-05-08 DIAGNOSIS — Z5309 Procedure and treatment not carried out because of other contraindication: Secondary | ICD-10-CM | POA: Insufficient documentation

## 2018-05-08 SURGERY — COLONOSCOPY WITH PROPOFOL
Anesthesia: Monitor Anesthesia Care

## 2018-05-08 MED ORDER — MIDAZOLAM HCL 2 MG/2ML IJ SOLN
INTRAMUSCULAR | Status: AC
  Filled 2018-05-08: qty 2

## 2018-05-08 MED ORDER — PROPOFOL 10 MG/ML IV BOLUS
INTRAVENOUS | Status: AC
  Filled 2018-05-08: qty 40

## 2018-05-08 NOTE — Progress Notes (Signed)
Patient hasn't drank but a total of 3/4 of trilyte prep, stating" not cleaned out, muddy water". Fleets enema given with toilet bowl filled with muddy water, Dr. Gala Romney notified. Office notified to reschedule patient due to poor prep and not following instructions.

## 2018-05-08 NOTE — Telephone Encounter (Signed)
Candy Sledge, RN from Short Stay called to say that the patient wasn't cleaned out and RMR wants him rescheduled.

## 2018-05-09 NOTE — Telephone Encounter (Signed)
Tried to call pt, no answer, LMOVM OV scheduled 06/12/18. Letter mailed.

## 2018-06-11 ENCOUNTER — Other Ambulatory Visit (HOSPITAL_COMMUNITY): Payer: Medicare HMO

## 2018-06-12 ENCOUNTER — Other Ambulatory Visit: Payer: Self-pay

## 2018-06-12 ENCOUNTER — Ambulatory Visit (INDEPENDENT_AMBULATORY_CARE_PROVIDER_SITE_OTHER): Payer: Medicare HMO | Admitting: Nurse Practitioner

## 2018-06-12 ENCOUNTER — Encounter: Payer: Self-pay | Admitting: Nurse Practitioner

## 2018-06-12 ENCOUNTER — Telehealth: Payer: Self-pay

## 2018-06-12 VITALS — BP 129/84 | HR 100 | Temp 97.1°F | Ht 63.0 in | Wt 205.0 lb

## 2018-06-12 DIAGNOSIS — Z85048 Personal history of other malignant neoplasm of rectum, rectosigmoid junction, and anus: Secondary | ICD-10-CM | POA: Diagnosis not present

## 2018-06-12 DIAGNOSIS — R69 Illness, unspecified: Secondary | ICD-10-CM

## 2018-06-12 MED ORDER — PEG 3350-KCL-NA BICARB-NACL 420 G PO SOLR: 4000.0000 mL | ORAL | 0 refills | Status: DC

## 2018-06-12 NOTE — Progress Notes (Signed)
Referring Provider: Rosita Fire, MD Primary Care Physician:  Rosita Fire, MD Primary GI:  Dr. Gala Romney  Chief Complaint  Patient presents with  . Colonoscopy    didn't follow instructions last time    HPI:   [name] is a 57 y.o. male who presents to reschedule colonoscopy due to poor prep.  Patient states he did not follow directions last time.  She was last seen in our office 03/06/2018 for history of rectal cancer.  Previous colonoscopy besides most recent attempt was completed 07/26/2016 which found diverticulosis of the sigmoid colon, a single 10 mm polyp of the hepatic flexure, otherwise normal.  No surgical pathology report was found.  He is followed by oncology for history of rectal cancer.  Underwent low anterior resection. Also had chemotherapy/radiation.   He completed oncological treatment of his cancer.  It was noted that he is overdue for repeat colonoscopy which was recommended to be completed in September 2018.  CT on 06/21/2017 did not show recurrence of disease.  Another CT is pending for August 20 19.  Still has port in place.  He was doing well at his last visit and no overt GI symptoms.  Anoscopy was scheduled for 05/08/2018.  However, when the patient arrived it was discovered he did not follow prep instructions and was not cleaned out.  The procedure was canceled and recommended a rescheduled procedure.  Today he states he's doing well. He states he followed his directions "backwards." Denies abdominal pain, N/V, hematochezia, melena, fever, chills, unintentional weight loss. Denies chest pain, dyspnea, dizziness, lightheadedness, syncope, near syncope. Denies any other upper or lower GI symptoms.   Past Medical History:  Diagnosis Date  . Arthritis   . Cancer ([place])    rectal  . Depression   . Diabetes mellitus    "Borderline"  . GERD (gastroesophageal reflux disease)    No weakness  . High cholesterol   . Hypertension   . Neuropathy    "back missed up"    . Rectal cancer ([place])   . Sleep apnea     Past Surgical History:  Procedure Laterality Date  . BACK SURGERY    . BIOPSY  03/31/2015   Procedure: BIOPSY;  Surgeon: Daneil Dolin, MD;  Location: [facility];  Service: Endoscopy;;  . CHOLECYSTECTOMY    . COLONOSCOPY WITH PROPOFOL N/A 03/31/2015   Procedure: COLONOSCOPY WITH PROPOFOL at cecum 0842; withdrawal time=21minutes;  Surgeon: Daneil Dolin, MD;  Location: [facility];  Service: Endoscopy;  Laterality: N/A;  . COLONOSCOPY WITH PROPOFOL N/A 05/21/2016   Procedure: COLONOSCOPY WITH PROPOFOL;  Surgeon: Daneil Dolin, MD;  [facility];  Service: Endoscopy;  Laterality: N/A;  200 - moved to 12:45 - office calling pt with 11:15 arrival time  . COLONOSCOPY WITH PROPOFOL N/A 07/26/2016   Procedure: COLONOSCOPY WITH PROPOFOL;  Surgeon: Daneil Dolin, MD;  [facility];  Service: Endoscopy;  Laterality: N/A;  115  . FLEXIBLE SIGMOIDOSCOPY N/A 07/05/2015   Procedure: FLEXIBLE SIGMOIDOSCOPY;  Surgeon: [name], MD;  [facility];  Service: Endoscopy;  Laterality: N/A;  with tattoo  . POLYPECTOMY  03/31/2015   Procedure: POLYPECTOMY;  Surgeon: Daneil Dolin, MD;  Location: [facility];  Service: Endoscopy;;  . PORTACATH PLACEMENT N/A 08/09/2015   Procedure: INSERTION PORT-A-CATH LEFT SUBCLAVIAN;  Surgeon: [name], MD;  Location: [facility];  Service: General;  Laterality: N/A;  . XI ROBOTIC ASSISTED LOWER ANTERIOR RESECTION N/A 07/06/2015  Procedure: XI ROBOTIC ASSISTED LOWER ANTERIOR RESECTION, rigid proctoscopy;  Surgeon: [name], MD;  Location: [facility];  Service: General;  Laterality: N/A;    Current Outpatient Medications  Medication Sig Dispense Refill  . amLODipine (NORVASC) 10 MG tablet Take 10 mg by mouth daily.     Marland Kitchen gabapentin (NEURONTIN) 800 MG tablet Take 1 tablet (800 mg total) by mouth 3 (three) times daily. 90 tablet 2  . lisinopril (PRINIVIL,ZESTRIL) 40 MG tablet Take 1 tablet (40 mg total) by mouth  every morning. 30 tablet 2  . NON FORMULARY PT HAS A C-PAP MACHINE    . nortriptyline (PAMELOR) 75 MG capsule Take 1 capsule (75 mg total) by mouth at bedtime. 30 capsule 2  . tiZANidine (ZANAFLEX) 2 MG tablet Take 1 tablet (2 mg total) by mouth every 8 (eight) hours as needed for muscle spasms. 30 tablet 2  . traMADol (ULTRAM) 50 MG tablet May take 1-2 tabs every 6 hours as needed for pain 120 tablet 0   No current facility-administered medications for this visit.     Allergies as of 06/12/2018  . (No Known Allergies)    Family History  Problem Relation Age of Onset  . Hypertension Unknown        "Not sure who, I don't know much about my family"  . Diabetes Unknown        "Not sure who, I don't know much about my family"  . Colon cancer Neg Hx        "Not that I know of"  . Colon polyps Neg Hx        "not that I know of"    Social History   Socioeconomic History  . Marital status: Divorced    Spouse name: Not on file  . Number of children: Not on file  . Years of education: Not on file  . Highest education level: Not on file  Occupational History  . Not on file  Social Needs  . Financial resource strain: Not on file  . Food insecurity:    Worry: Not on file    Inability: Not on file  . Transportation needs:    Medical: Not on file    Non-medical: Not on file  Tobacco Use  . Smoking status: Former Smoker    Packs/day: 0.25    Years: 2.00    Pack years: 0.50    Types: Cigarettes    Last attempt to quit: 03/23/2006    Years since quitting: 12.2  . Smokeless tobacco: Never Used  Substance and Sexual Activity  . Alcohol use: No    Alcohol/week: 0.0 oz  . Drug use: No  . Sexual activity: Not Currently  Lifestyle  . Physical activity:    Days per week: Not on file    Minutes per session: Not on file  . Stress: Not on file  Relationships  . Social connections:    Talks on phone: Not on file    Gets together: Not on file    Attends religious service: Not on  file    Active member of club or organization: Not on file    Attends meetings of clubs or organizations: Not on file    Relationship status: Not on file  Other Topics Concern  . Not on file  Social History Narrative  . Not on file    Review of Systems: General: Negative for anorexia, weight loss, fever, chills, fatigue, weakness. Eyes: Negative for vision changes.  ENT: Negative  for hoarseness, difficulty swallowing , nasal congestion. CV: Negative for chest pain, angina, palpitations, dyspnea on exertion, peripheral edema.  Respiratory: Negative for dyspnea at rest, dyspnea on exertion, cough, sputum, wheezing.  GI: See history of present illness. GU:  Negative for dysuria, hematuria, urinary incontinence, urinary frequency, nocturnal urination.  MS: Negative for joint pain, low back pain.  Derm: Negative for rash or itching.  Neuro: Negative for weakness, abnormal sensation, seizure, frequent headaches, memory loss, confusion.  Psych: Negative for anxiety, depression, suicidal ideation, hallucinations.  Endo: Negative for unusual weight change.  Heme: Negative for bruising or bleeding. Allergy: Negative for rash or hives.   Physical Exam: BP 129/84   Pulse 100   Temp (!) 97.1 F (36.2 C) (Oral)   Ht 5\' 3"  (1.6 m)   Wt 205 lb (93 kg)   BMI 36.31 kg/m  General:   Alert and oriented. Pleasant and cooperative. Well-nourished and well-developed.  Head:  Normocephalic and atraumatic. Eyes:  Without icterus, sclera clear and conjunctiva pink.  Ears:  Normal auditory acuity. Mouth:  No deformity or lesions, oral mucosa pink.  Throat/Neck:  Supple, without mass or thyromegaly. Cardiovascular:  S1, S2 present without murmurs appreciated. Normal pulses noted. Extremities without clubbing or edema. Respiratory:  Clear to auscultation bilaterally. No wheezes, rales, or rhonchi. No distress.  Gastrointestinal:  +BS, soft, non-tender and non-distended. No HSM noted. No guarding or  rebound. No masses appreciated.  Rectal:  Deferred  Musculoskalatal:  Symmetrical without gross deformities. Normal posture. Skin:  Intact without significant lesions or rashes. Neurologic:  Alert and oriented x4;  grossly normal neurologically. Psych:  Alert and cooperative. Normal mood and affect. Heme/Lymph/Immune: No significant cervical adenopathy. No excessive bruising noted.    06/12/2018 9:08 AM   Disclaimer: This note was dictated with voice recognition software. Similar sounding words can inadvertently be transcribed and may not be corrected upon review.

## 2018-06-12 NOTE — Patient Instructions (Signed)
1. We will reschedule your colonoscopy for you. 2. It is very important to follow the instructions step-by-step, to the letter. 3. Further recommendations will be made after your colonoscopy. 4. Return for follow-up based on recommendations made after your colonoscopy. 5. Call us if you have any questions or concerns.  At [facility] we value your feedback. You may receive a survey about your visit today. Please share your experience as we strive to create trusting relationships with our patients to provide genuine, compassionate, quality care.  It was great to see you today!  I hope you have a great summer!!

## 2018-06-12 NOTE — Assessment & Plan Note (Signed)
History of rectal cancer and overdue for surveillance colonoscopy per oncology recommendations.  Attempted colonoscopy recently but he did not follow his prep instructions and the procedure was subsequently canceled.  Presents today for rescheduling of his procedure.  We will proceed with a colonoscopy as needed.  Follow-up based on post procedure recommendations.  Proceed with TCS on propofol/MAC with Dr. Gala Romney in near future: the risks, benefits, and alternatives have been discussed with the patient in detail. The patient states understanding and desires to proceed.  The patient is currently on Neurontin and Ultram.  He just recently discontinued hydrocodone.  No other anticoagulants, anxiolytics, chronic pain medications, or antidepressants.  We will plan for the procedure on propofol/MAC to promote adequate sedation.

## 2018-06-12 NOTE — Assessment & Plan Note (Signed)
The patient is on multiple medications that could make conscious sedation difficult.  Subsequently we will plan for propofol for his upcoming procedure for adequate sedation.

## 2018-06-12 NOTE — Telephone Encounter (Signed)
Tried to call pt to inform of pre-op appt 07/24/18 at 12:45pm, call went straight to VM. VM box is full. Letter mailed.

## 2018-06-12 NOTE — Progress Notes (Signed)
cc'ed to pcp °

## 2018-06-19 ENCOUNTER — Other Ambulatory Visit (HOSPITAL_COMMUNITY): Payer: Medicare HMO

## 2018-06-20 ENCOUNTER — Ambulatory Visit (HOSPITAL_COMMUNITY): Payer: Medicare HMO

## 2018-06-20 DIAGNOSIS — I10 Essential (primary) hypertension: Secondary | ICD-10-CM | POA: Diagnosis not present

## 2018-06-20 DIAGNOSIS — G894 Chronic pain syndrome: Secondary | ICD-10-CM | POA: Diagnosis not present

## 2018-06-20 DIAGNOSIS — G63 Polyneuropathy in diseases classified elsewhere: Secondary | ICD-10-CM | POA: Diagnosis not present

## 2018-06-20 DIAGNOSIS — M549 Dorsalgia, unspecified: Secondary | ICD-10-CM | POA: Diagnosis not present

## 2018-06-20 DIAGNOSIS — R7303 Prediabetes: Secondary | ICD-10-CM | POA: Diagnosis not present

## 2018-06-25 ENCOUNTER — Encounter (HOSPITAL_COMMUNITY): Payer: Self-pay

## 2018-06-25 ENCOUNTER — Ambulatory Visit (HOSPITAL_COMMUNITY): Payer: Medicare HMO | Admitting: Internal Medicine

## 2018-06-25 ENCOUNTER — Inpatient Hospital Stay (HOSPITAL_COMMUNITY): Payer: Medicare HMO | Attending: Internal Medicine

## 2018-06-25 ENCOUNTER — Other Ambulatory Visit: Payer: Self-pay

## 2018-06-25 VITALS — BP 129/75 | HR 93 | Temp 97.7°F | Resp 18 | Wt 202.0 lb

## 2018-06-25 DIAGNOSIS — C20 Malignant neoplasm of rectum: Secondary | ICD-10-CM | POA: Insufficient documentation

## 2018-06-25 DIAGNOSIS — Z95828 Presence of other vascular implants and grafts: Secondary | ICD-10-CM

## 2018-06-25 LAB — CBC WITH DIFFERENTIAL/PLATELET
BASOS ABS: 0 10*3/uL (ref 0.0–0.1)
BASOS PCT: 0 %
EOS ABS: 0.5 10*3/uL (ref 0.0–0.7)
Eosinophils Relative: 12 %
HCT: 40.5 % (ref 39.0–52.0)
Hemoglobin: 13.1 g/dL (ref 13.0–17.0)
Lymphocytes Relative: 25 %
Lymphs Abs: 1.1 10*3/uL (ref 0.7–4.0)
MCH: 29.2 pg (ref 26.0–34.0)
MCHC: 32.3 g/dL (ref 30.0–36.0)
MCV: 90.2 fL (ref 78.0–100.0)
MONO ABS: 0.4 10*3/uL (ref 0.1–1.0)
MONOS PCT: 8 %
NEUTROS PCT: 55 %
Neutro Abs: 2.5 10*3/uL (ref 1.7–7.7)
Platelets: 177 10*3/uL (ref 150–400)
RBC: 4.49 MIL/uL (ref 4.22–5.81)
RDW: 12.8 % (ref 11.5–15.5)
WBC: 4.5 10*3/uL (ref 4.0–10.5)

## 2018-06-25 LAB — COMPREHENSIVE METABOLIC PANEL
ALK PHOS: 69 U/L (ref 38–126)
ALT: 35 U/L (ref 0–44)
ANION GAP: 5 (ref 5–15)
AST: 26 U/L (ref 15–41)
Albumin: 4.1 g/dL (ref 3.5–5.0)
BILIRUBIN TOTAL: 0.7 mg/dL (ref 0.3–1.2)
BUN: 18 mg/dL (ref 6–20)
CALCIUM: 9.2 mg/dL (ref 8.9–10.3)
CO2: 25 mmol/L (ref 22–32)
CREATININE: 1.05 mg/dL (ref 0.61–1.24)
Chloride: 109 mmol/L (ref 98–111)
GFR calc non Af Amer: >60 mL/min (ref >60)
GLUCOSE: 105 mg/dL — AB (ref 70–99)
Potassium: 3.7 mmol/L (ref 3.5–5.1)
SODIUM: 139 mmol/L (ref 135–145)
Total Protein: 7.8 g/dL (ref 6.5–8.1)

## 2018-06-25 MED ORDER — SODIUM CHLORIDE 0.9% FLUSH
10.0000 mL | INTRAVENOUS | Status: DC | PRN
  Administered 2018-06-25: 10 mL via INTRAVENOUS
  Filled 2018-06-25: qty 10

## 2018-06-25 MED ORDER — HEPARIN SOD (PORK) LOCK FLUSH 100 UNIT/ML IV SOLN
500.0000 [IU] | Freq: Once | INTRAVENOUS | Status: AC
  Administered 2018-06-25: 500 [IU] via INTRAVENOUS

## 2018-06-25 NOTE — Progress Notes (Signed)
Pt here today for port flush and labs. [name] presented for Portacath access and flush.  Portacath located left chest wall accessed with  H 20 needle.  Good blood return present. Labs drawn and sent to lab for processing. Portacath flushed with 17ml NS and 500U/48ml Heparin and needle removed intact.  Procedure tolerated well and without incident.   Pt stable and discharged home ambulatory with cane. Pt to return as scheduled for follow up and port flushes.

## 2018-06-26 LAB — CEA: CEA1: 4.8 ng/mL — AB (ref 0.0–4.7)

## 2018-07-09 ENCOUNTER — Ambulatory Visit (HOSPITAL_COMMUNITY): Payer: Medicare HMO

## 2018-07-10 ENCOUNTER — Inpatient Hospital Stay (HOSPITAL_COMMUNITY): Payer: Medicare HMO | Admitting: Internal Medicine

## 2018-07-24 ENCOUNTER — Encounter (HOSPITAL_COMMUNITY)
Admission: RE | Admit: 2018-07-24 | Discharge: 2018-07-24 | Disposition: A | Discharge status: Home / Self Care | Payer: Medicare HMO | Source: Ambulatory Visit | Attending: Internal Medicine | Admitting: Internal Medicine

## 2018-07-25 ENCOUNTER — Encounter (HOSPITAL_COMMUNITY): Payer: Self-pay

## 2018-07-25 ENCOUNTER — Telehealth: Payer: Self-pay

## 2018-07-25 NOTE — Telephone Encounter (Signed)
Tried to call pt to see if he can arrive earlier for TCS 07/28/18, call went straight to VM, unable to leave VM d/t mailbox is full. LMOVM to inform endo scheduler.

## 2018-07-28 ENCOUNTER — Encounter (HOSPITAL_COMMUNITY)
Admission: RE | Disposition: A | Discharge status: Home / Self Care | Payer: Self-pay | Source: Ambulatory Visit | Attending: Internal Medicine

## 2018-07-28 ENCOUNTER — Ambulatory Visit (HOSPITAL_COMMUNITY): Payer: Medicare HMO | Admitting: Anesthesiology

## 2018-07-28 ENCOUNTER — Encounter (HOSPITAL_COMMUNITY): Payer: Self-pay | Admitting: *Deleted

## 2018-07-28 ENCOUNTER — Other Ambulatory Visit: Payer: Self-pay

## 2018-07-28 ENCOUNTER — Ambulatory Visit (HOSPITAL_COMMUNITY)
Admission: RE | Admit: 2018-07-28 | Discharge: 2018-07-28 | Disposition: A | Discharge status: Home / Self Care | Payer: Medicare HMO | Source: Ambulatory Visit | Attending: Internal Medicine | Admitting: Internal Medicine

## 2018-07-28 DIAGNOSIS — Z85048 Personal history of other malignant neoplasm of rectum, rectosigmoid junction, and anus: Secondary | ICD-10-CM

## 2018-07-28 DIAGNOSIS — Z79899 Other long term (current) drug therapy: Secondary | ICD-10-CM | POA: Insufficient documentation

## 2018-07-28 DIAGNOSIS — Z87891 Personal history of nicotine dependence: Secondary | ICD-10-CM | POA: Insufficient documentation

## 2018-07-28 DIAGNOSIS — E1140 Type 2 diabetes mellitus with diabetic neuropathy, unspecified: Secondary | ICD-10-CM | POA: Diagnosis not present

## 2018-07-28 DIAGNOSIS — G4730 Sleep apnea, unspecified: Secondary | ICD-10-CM | POA: Insufficient documentation

## 2018-07-28 DIAGNOSIS — K219 Gastro-esophageal reflux disease without esophagitis: Secondary | ICD-10-CM | POA: Diagnosis not present

## 2018-07-28 DIAGNOSIS — E7800 Pure hypercholesterolemia, unspecified: Secondary | ICD-10-CM | POA: Insufficient documentation

## 2018-07-28 DIAGNOSIS — I10 Essential (primary) hypertension: Secondary | ICD-10-CM | POA: Diagnosis not present

## 2018-07-28 DIAGNOSIS — K5730 Diverticulosis of large intestine without perforation or abscess without bleeding: Secondary | ICD-10-CM | POA: Insufficient documentation

## 2018-07-28 DIAGNOSIS — Z85038 Personal history of other malignant neoplasm of large intestine: Secondary | ICD-10-CM

## 2018-07-28 DIAGNOSIS — F329 Major depressive disorder, single episode, unspecified: Secondary | ICD-10-CM | POA: Insufficient documentation

## 2018-07-28 DIAGNOSIS — Z1211 Encounter for screening for malignant neoplasm of colon: Secondary | ICD-10-CM | POA: Insufficient documentation

## 2018-07-28 HISTORY — PX: COLONOSCOPY WITH PROPOFOL: SHX5780

## 2018-07-28 LAB — GLUCOSE, CAPILLARY
Glucose-Capillary: 60 mg/dL — ABNORMAL LOW (ref 70–99)
Glucose-Capillary: 61 mg/dL — ABNORMAL LOW (ref 70–99)

## 2018-07-28 SURGERY — COLONOSCOPY WITH PROPOFOL
Anesthesia: General

## 2018-07-28 MED ORDER — FENTANYL CITRATE (PF) 100 MCG/2ML IJ SOLN: 25.0000 ug | INTRAMUSCULAR | Status: DC | PRN

## 2018-07-28 MED ORDER — HYDROCODONE-ACETAMINOPHEN 7.5-325 MG PO TABS: 1.0000 | ORAL_TABLET | Freq: Once | ORAL | Status: DC | PRN

## 2018-07-28 MED ORDER — CHLORHEXIDINE GLUCONATE CLOTH 2 % EX PADS: 6.0000 | MEDICATED_PAD | Freq: Once | CUTANEOUS | Status: DC

## 2018-07-28 MED ORDER — LACTATED RINGERS IV SOLN
INTRAVENOUS | Status: DC | PRN
  Administered 2018-07-28: 15:00:00 via INTRAVENOUS

## 2018-07-28 MED ORDER — LACTATED RINGERS IV SOLN: INTRAVENOUS | Status: DC

## 2018-07-28 MED ORDER — PROPOFOL 10 MG/ML IV BOLUS
INTRAVENOUS | Status: DC | PRN
  Administered 2018-07-28: 40 mg via INTRAVENOUS

## 2018-07-28 MED ORDER — PROPOFOL 500 MG/50ML IV EMUL
INTRAVENOUS | Status: DC | PRN
  Administered 2018-07-28: 150 ug/kg/min via INTRAVENOUS

## 2018-07-28 NOTE — H&P (Signed)
@LOGO @   Primary Care Physician:  Rosita Fire, MD Primary Gastroenterologist:  Dr. Gala Romney  Pre-Procedure History & Physical: HPI:  [name] is a 57 y.o. male here for surveillance colonoscopy. History of low anterior resection for rectal cancer previously. Clinically, doing well.  Past Medical History:  Diagnosis Date  . Arthritis   . Cancer ([place])    rectal  . Depression   . Diabetes mellitus    "Borderline"  . GERD (gastroesophageal reflux disease)    No weakness  . High cholesterol   . Hypertension   . Neuropathy    "back missed up"  . Rectal cancer ([place])   . Sleep apnea     Past Surgical History:  Procedure Laterality Date  . BACK SURGERY    . BIOPSY  03/31/2015   Procedure: BIOPSY;  Surgeon: Daneil Dolin, MD;  [facility]: [facility];  Service: Endoscopy;;  . CHOLECYSTECTOMY    . COLONOSCOPY WITH PROPOFOL N/A 03/31/2015   Procedure: COLONOSCOPY WITH PROPOFOL at cecum 0842; withdrawal time=58minutes;  Surgeon: Daneil Dolin, MD;  [facility];  Service: Endoscopy;  Laterality: N/A;  . COLONOSCOPY WITH PROPOFOL N/A 05/21/2016   Procedure: COLONOSCOPY WITH PROPOFOL;  Surgeon: Daneil Dolin, MD;  [facility];  Service: Endoscopy;  Laterality: N/A;  200 - moved to 12:45 - office calling pt with 11:15 arrival time  . COLONOSCOPY WITH PROPOFOL N/A 07/26/2016   Procedure: COLONOSCOPY WITH PROPOFOL;  Surgeon: Daneil Dolin, MD;  [facility];  Service: Endoscopy;  Laterality: N/A;  115  . FLEXIBLE SIGMOIDOSCOPY N/A 07/05/2015   Procedure: FLEXIBLE SIGMOIDOSCOPY;  Surgeon: [name], MD;  [facility];  Service: Endoscopy;  Laterality: N/A;  with tattoo  . POLYPECTOMY  03/31/2015   Procedure: POLYPECTOMY;  Surgeon: Daneil Dolin, MD;  [facility];  Service: Endoscopy;;  . PORTACATH PLACEMENT N/A 08/09/2015   Procedure: INSERTION PORT-A-CATH LEFT SUBCLAVIAN;  Surgeon: [name], MD;  Location: [facility];  Service: General;  Laterality:  N/A;  . XI ROBOTIC ASSISTED LOWER ANTERIOR RESECTION N/A 07/06/2015   Procedure: XI ROBOTIC ASSISTED LOWER ANTERIOR RESECTION, rigid proctoscopy;  Surgeon: [name], MD;  [facility];  Service: General;  Laterality: N/A;    Prior to Admission medications   Medication Sig Start Date End Date Taking? Authorizing Provider  amLODipine (NORVASC) 10 MG tablet Take 10 mg by mouth daily.  12/05/17  Yes [provider]  gabapentin (NEURONTIN) 800 MG tablet Take 1 tablet (800 mg total) by mouth 3 (three) times daily. Patient taking differently: Take 800 mg by mouth 2 (two) times daily.  11/30/15  Yes Penland, Kelby Fam, MD  lisinopril (PRINIVIL,ZESTRIL) 40 MG tablet Take 1 tablet (40 mg total) by mouth every morning. 11/30/15  Yes Penland, Kelby Fam, MD  nortriptyline (PAMELOR) 75 MG capsule Take 1 capsule (75 mg total) by mouth at bedtime. 11/30/15  Yes Penland, Kelby Fam, MD  polyethylene glycol-electrolytes (TRILYTE) 420 g solution Take 4,000 mLs by mouth as directed. 06/12/18  Yes Cashlyn Huguley, Cristopher Estimable, MD  tiZANidine (ZANAFLEX) 2 MG tablet Take 1 tablet (2 mg total) by mouth every 8 (eight) hours as needed for muscle spasms. Patient taking differently: Take 2 mg by mouth 2 (two) times daily.  11/30/15  Yes Penland, Kelby Fam, MD  traMADol Veatrice Bourbon) 50 MG tablet May take 1-2 tabs every 6 hours as needed for pain Patient taking differently: Take 50-100 mg by mouth every 6 (six) hours  as needed for moderate pain or severe pain.  06/25/17  Yes Twana First, MD  ACCU-CHEK FASTCLIX LANCETS [place]  06/12/18   [provider]  ACCU-CHEK SMARTVIEW test strip  06/12/18   [provider]  Alcohol Swabs (B-D SINGLE USE SWABS REGULAR) PADS  06/12/18   [provider]  NON FORMULARY PT HAS A C-PAP MACHINE    [provider]  prochlorperazine (COMPAZINE) 10 MG tablet Take 1 tablet (10 mg total) by mouth every 6 (six) hours as needed (Nausea or vomiting). Patient not taking:  Reported on 10/06/2015 08/05/15 12/05/15  Patrici Ranks, MD    Allergies as of 06/12/2018  . (No Known Allergies)    Family History  Problem Relation Age of Onset  . Hypertension Unknown        "Not sure who, I don't know much about my family"  . Diabetes Unknown        "Not sure who, I don't know much about my family"  . Colon cancer Neg Hx        "Not that I know of"  . Colon polyps Neg Hx        "not that I know of"    Social History   Socioeconomic History  . Marital status: Divorced    Spouse name: Not on file  . Number of children: Not on file  . Years of education: Not on file  . Highest education level: Not on file  Occupational History  . Not on file  Social Needs  . Financial resource strain: Not on file  . Food insecurity:    Worry: Not on file    Inability: Not on file  . Transportation needs:    Medical: Not on file    Non-medical: Not on file  Tobacco Use  . Smoking status: Former Smoker    Packs/day: 0.25    Years: 2.00    Pack years: 0.50    Types: Cigarettes    Last attempt to quit: 03/23/2006    Years since quitting: 12.3  . Smokeless tobacco: Never Used  Substance and Sexual Activity  . Alcohol use: No    Alcohol/week: 0.0 standard drinks  . Drug use: No  . Sexual activity: Not Currently  Lifestyle  . Physical activity:    Days per week: Not on file    Minutes per session: Not on file  . Stress: Not on file  Relationships  . Social connections:    Talks on phone: Not on file    Gets together: Not on file    Attends religious service: Not on file    Active member of club or organization: Not on file    Attends meetings of clubs or organizations: Not on file    Relationship status: Not on file  . Intimate partner violence:    Fear of current or ex partner: Not on file    Emotionally abused: Not on file    Physically abused: Not on file    Forced sexual activity: Not on file  Other Topics Concern  . Not on file  Social History  Narrative  . Not on file    Review of Systems: See HPI, otherwise negative ROS  Physical Exam: Pulse (P) 90   Temp (P) 98.4 F (36.9 C) (Oral)   Resp (P) 14   Ht (P) 5\' 3"  (1.6 m)   Wt (P) 91.6 kg   SpO2 (P) 99%   BMI (P) 35.77 kg/m  General:  Alert,  Well-developed, well-nourished, pleasant and cooperative in NAD Heart:  Regular rate and rhythm; no murmurs, clicks, rubs,  or gallops. Abdomen: Non-distended, normal bowel sounds.  Soft and nontender without appreciable mass or hepatosplenomegaly.  Pulses:  Normal pulses noted. Extremities:  Without clubbing or edema.  Impression/Plan:  57 year old gentleman with a history of rectal cancer status post low anterior resection; Here for surveillance colonoscopy.  The risks, benefits, limitations, alternatives and imponderables have been reviewed with the patient. Questions have been answered. All parties are agreeable.      Notice: This dictation was prepared with Dragon dictation along with smaller phrase technology. Any transcriptional errors that result from this process are unintentional and may not be corrected upon review.

## 2018-07-28 NOTE — Progress Notes (Signed)
Patient arrive to preop at 1215 with cup in hand drinking bowel prep. Patient made aware that he must wait 2 hours after last drink to have procedure. Patient stated he was still passing brown stool. Discussed with Dr. Gala Romney and Dr. Hilaria Ota. 1 8 ounce cup of bowel prep given at 1245 per Dr. Coralee North verbal order. 2 tap water enemas given at 1:45 and 1430 as per Dr. Coralee North order. Last BM clear at 1500

## 2018-07-28 NOTE — Anesthesia Postprocedure Evaluation (Signed)
Anesthesia Post Note  Patient: [name]  Procedure(s) Performed: COLONOSCOPY WITH PROPOFOL (N/A )  Patient location during evaluation: PACU Anesthesia Type: General Level of consciousness: awake and alert and oriented Pain management: pain level controlled Vital Signs Assessment: post-procedure vital signs reviewed and stable Respiratory status: spontaneous breathing Postop Assessment: no apparent nausea or vomiting Anesthetic complications: no     Last Vitals:  Vitals:   07/28/18 1611 07/28/18 1615  BP: 109/63 105/74  Pulse: 94 92  Resp: 16 16  Temp: 36.9 C   SpO2: 98% 98%    Last Pain:  Vitals:   07/28/18 1611  TempSrc:   PainSc: 0-No pain                 ADAMS, AMY A

## 2018-07-28 NOTE — Anesthesia Procedure Notes (Signed)
Procedure Name: MAC Date/Time: 07/28/2018 3:35 PM Performed by: Andree Elk Kentrell Hallahan A, CRNA Pre-anesthesia Checklist: Patient identified, Emergency Drugs available, Suction available, Patient being monitored and Timeout performed Oxygen Delivery Method: Simple face mask

## 2018-07-28 NOTE — Discharge Instructions (Signed)
Colonoscopy Discharge Instructions  Read the instructions outlined below and refer to this sheet in the next few weeks. These discharge instructions provide you with general information on caring for yourself after you leave the hospital. Your doctor may also give you specific instructions. While your treatment has been planned according to the most current medical practices available, unavoidable complications occasionally occur. If you have any problems or questions after discharge, call Dr. Gala Romney at [phone]. ACTIVITY  You may resume your regular activity, but move at a slower pace for the next 24 hours.   Take frequent rest periods for the next 24 hours.   Walking will help get rid of the air and reduce the bloated feeling in your belly (abdomen).   No driving for 24 hours (because of the medicine (anesthesia) used during the test).    Do not sign any important legal documents or operate any machinery for 24 hours (because of the anesthesia used during the test).  NUTRITION  Drink plenty of fluids.   You may resume your normal diet as instructed by your doctor.   Begin with a light meal and progress to your normal diet. Heavy or fried foods are harder to digest and may make you feel sick to your stomach (nauseated).   Avoid alcoholic beverages for 24 hours or as instructed.  MEDICATIONS  You may resume your normal medications unless your doctor tells you otherwise.  WHAT YOU CAN EXPECT TODAY  Some feelings of bloating in the abdomen.   Passage of more gas than usual.   Spotting of blood in your stool or on the toilet paper.  IF YOU HAD POLYPS REMOVED DURING THE COLONOSCOPY:  No aspirin products for 7 days or as instructed.   No alcohol for 7 days or as instructed.   Eat a soft diet for the next 24 hours.  FINDING OUT THE RESULTS OF YOUR TEST Not all test results are available during your visit. If your test results are not back during the visit, make an appointment  with your caregiver to find out the results. Do not assume everything is normal if you have not heard from your caregiver or the medical facility. It is important for you to follow up on all of your test results.  SEEK IMMEDIATE MEDICAL ATTENTION IF:  You have more than a spotting of blood in your stool.   Your belly is swollen (abdominal distention).   You are nauseated or vomiting.   You have a temperature over 101.   You have abdominal pain or discomfort that is severe or gets worse throughout the day.    The colonoscopy preparation was again poor today  You should have a repeat examination in one year.   Monitored Anesthesia Care, Care After These instructions provide you with information about caring for yourself after your procedure. Your health care provider may also give you more specific instructions. Your treatment has been planned according to current medical practices, but problems sometimes occur. Call your health care provider if you have any problems or questions after your procedure. What can I expect after the procedure? After your procedure, it is common to:  Feel sleepy for several hours.  Feel clumsy and have poor balance for several hours.  Feel forgetful about what happened after the procedure.  Have poor judgment for several hours.  Feel nauseous or vomit.  Have a sore throat if you had a breathing tube during the procedure.  Follow these instructions at home: For at  least 24 hours after the procedure:   Do not: ? Participate in activities in which you could fall or become injured. ? Drive. ? Use heavy machinery. ? Drink alcohol. ? Take sleeping pills or medicines that cause drowsiness. ? Make important decisions or sign legal documents. ? Take care of children on your own.  Rest. Eating and drinking  Follow the diet that is recommended by your health care provider.  If you vomit, drink water, juice, or soup when you can drink without  vomiting.  Make sure you have little or no nausea before eating solid foods. General instructions  Have a responsible adult stay with you until you are awake and alert.  Take over-the-counter and prescription medicines only as told by your health care provider.  If you smoke, do not smoke without supervision.  Keep all follow-up visits as told by your health care provider. This is important. Contact a health care provider if:  You keep feeling nauseous or you keep vomiting.  You feel light-headed.  You develop a rash.  You have a fever. Get help right away if:  You have trouble breathing. This information is not intended to replace advice given to you by your health care provider. Make sure you discuss any questions you have with your health care provider. Document Released: 02/26/2016 Document Revised: 06/27/2016 Document Reviewed: 02/26/2016 Elsevier Interactive Patient Education  [employer].

## 2018-07-28 NOTE — Anesthesia Postprocedure Evaluation (Signed)
Anesthesia Post Note  Patient: [name]  Procedure(s) Performed: COLONOSCOPY WITH PROPOFOL (N/A )  Patient location during evaluation: PACU Anesthesia Type: General Level of consciousness: awake and alert and oriented Pain management: pain level controlled Vital Signs Assessment: post-procedure vital signs reviewed and stable Respiratory status: spontaneous breathing Cardiovascular status: stable Postop Assessment: no apparent nausea or vomiting Anesthetic complications: no     Last Vitals:  Vitals:   07/28/18 1300  Pulse: (P) 90  Resp: (P) 14  Temp: (P) 36.9 C  SpO2: (P) 99%    Last Pain:  Vitals:   07/28/18 1532  TempSrc:   PainSc: 0-No pain                 Amer Alcindor A

## 2018-07-28 NOTE — Transfer of Care (Signed)
Immediate Anesthesia Transfer of Care Note  Patient: [name]  Procedure(s) Performed: COLONOSCOPY WITH PROPOFOL (N/A )  Patient Location: PACU  Anesthesia Type:General  Level of Consciousness: awake, alert , oriented and patient cooperative  Airway & Oxygen Therapy: Patient Spontanous Breathing  Post-op Assessment: Report given to RN and Post -op Vital signs reviewed and stable  Post vital signs: Reviewed and stable  Last Vitals:  Vitals Value Taken Time  BP 109/63 07/28/2018  4:11 PM  Temp    Pulse 92 07/28/2018  4:13 PM  Resp 22 07/28/2018  4:13 PM  SpO2 99 % 07/28/2018  4:13 PM  Vitals shown include unvalidated device data.  Last Pain:  Vitals:   07/28/18 1532  TempSrc:   PainSc: 0-No pain         Complications: No apparent anesthesia complications

## 2018-07-28 NOTE — Anesthesia Preprocedure Evaluation (Signed)
Anesthesia Evaluation  Patient identified by MRN, date of birth, ID band Patient awake    Reviewed: Allergy & Precautions, NPO status , Patient's Chart, lab work & pertinent test results  Airway Mallampati: II  TM Distance: >3 FB Neck ROM: Full    Dental no notable dental hx.    Pulmonary neg pulmonary ROS, sleep apnea and Continuous Positive Airway Pressure Ventilation , former smoker,    Pulmonary exam normal breath sounds clear to auscultation       Cardiovascular Exercise Tolerance: Good hypertension, Pt. on medications negative cardio ROS Normal cardiovascular examI Rhythm:Regular Rate:Normal     Neuro/Psych Depression negative neurological ROS  negative psych ROS   GI/Hepatic negative GI ROS, Neg liver ROS, GERD  Medicated and Controlled,H/o rectal Ca -finished CT/RT in 2018   Endo/Other  negative endocrine ROSdiabetesNo current DM meds - Glc 60 this am  Renal/GU negative Renal ROS  negative genitourinary   Musculoskeletal negative musculoskeletal ROS (+) Arthritis , Osteoarthritis,    Abdominal   Peds negative pediatric ROS (+)  Hematology negative hematology ROS (+)   Anesthesia Other Findings   Reproductive/Obstetrics negative OB ROS                             Anesthesia Physical Anesthesia Plan  ASA: II  Anesthesia Plan: General   Post-op Pain Management:    Induction: Intravenous  PONV Risk Score and Plan:   Airway Management Planned: Simple Face Mask and Nasal Cannula  Additional Equipment:   Intra-op Plan:   Post-operative Plan:   Informed Consent: I have reviewed the patients History and Physical, chart, labs and discussed the procedure including the risks, benefits and alternatives for the proposed anesthesia with the patient or authorized representative who has indicated his/her understanding and acceptance.   Dental advisory given  Plan Discussed with:  CRNA  Anesthesia Plan Comments:         Anesthesia Quick Evaluation

## 2018-07-28 NOTE — Op Note (Signed)
[facility] Patient Name: [name] Procedure Date: 07/28/2018 3:06 PM MRN: [id number] Date of Birth: 1961/10/02 Attending MD: Norvel Richards , MD CSN: [id number] Age: 57 Admit Type: Outpatient Procedure:                Colonoscopy Indications:              High risk colon cancer surveillance: Personal                            history of colon cancer Providers:                Norvel Richards, MD, Janeece Riggers, RN, Aram Candela Referring MD:              Medicines:                Propofol per Anesthesia Complications:            No immediate complications. Estimated Blood Loss:     Estimated blood loss: none. Procedure:                Pre-Anesthesia Assessment:                           - Prior to the procedure, a History and Physical                            was performed, and patient medications and                            allergies were reviewed. The patient's tolerance of                            previous anesthesia was also reviewed. The risks                            and benefits of the procedure and the sedation                            options and risks were discussed with the patient.                            All questions were answered, and informed consent                            was obtained. Prior Anticoagulants: The patient has                            taken no previous anticoagulant or antiplatelet                            agents. ASA Grade Assessment: III - A patient with  severe systemic disease. After reviewing the risks                            and benefits, the patient was deemed in                            satisfactory condition to undergo the procedure.                           After obtaining informed consent, the colonoscope                            was passed under direct vision. Throughout the                            procedure, the patient's blood pressure,  pulse, and                            oxygen saturations were monitored continuously. The                            CF-[id number] ([id number]) scope was introduced through                            the and advanced to the the cecum, identified by                            appendiceal orifice and ileocecal valve. The                            colonoscopy was performed without difficulty. The                            entire colon was not well visualized. The quality                            of the bowel preparation was inadequate. Scope In: 3:39:51 PM Scope Out: 4:03:22 PM Scope Withdrawal Time: 0 hours 8 minutes 40 seconds  Total Procedure Duration: 0 hours 23 minutes 31 seconds  Findings:      The perianal and digital rectal examinations were normal. Surgical       anastomosis at 6 cm from anal Verge. Long redundant colon requiring       changing of the patient's position and external abdominal pressure to       reach the cecum.      Scattered medium-mouthed diverticula were found in the sigmoid colon and       descending colon. poor preparation compromise examination. Not all the       mucosal surfaces could be seen today.      The exam was otherwise without abnormality on direct and retroflexion       views. Impression:               - Diverticulosis in the sigmoid colon and in the  descending colon. long redundant colon                           - The examination was otherwise normal on direct                            and retroflexion views. Compromised examination.                            Inadequate preparation.                           - No specimens collected. Moderate Sedation:      Moderate (conscious) sedation was personally administered by an       anesthesia professional. The following parameters were monitored: oxygen       saturation, heart rate, blood pressure, respiratory rate, EKG, adequacy       of pulmonary ventilation, and response  to care. Total physician       intraservice time was 27 minutes. Recommendation:           - Patient has a contact number available for                            emergencies. The signs and symptoms of potential                            delayed complications were discussed with the                            patient. Return to normal activities tomorrow.                            Written discharge instructions were provided to the                            patient.                           - Resume previous diet.                           - Repeat colonoscopy in 1 year for surveillance.                           - Return to GI office in 1 year. Procedure Code(s):        --- Professional ---                           [phone], Colonoscopy, flexible; diagnostic, including                            collection of specimen(s) by brushing or washing,                            when performed (separate procedure) Diagnosis Code(s):        --- Professional ---  Z85.038, Personal history of other malignant                            neoplasm of large intestine                           K57.30, Diverticulosis of large intestine without                            perforation or abscess without bleeding CPT copyright 2017 [employer]. All rights reserved. The codes documented in this report are preliminary and upon coder review may  be revised to meet current compliance requirements. Cristopher Estimable. Lakeyia Surber, MD Norvel Richards, MD 07/28/2018 4:11:34 PM This report has been signed electronically. Number of Addenda: 0

## 2018-08-01 ENCOUNTER — Encounter (HOSPITAL_COMMUNITY): Payer: Self-pay | Admitting: Internal Medicine

## 2018-09-03 ENCOUNTER — Telehealth: Payer: Self-pay

## 2018-09-03 NOTE — Telephone Encounter (Signed)
-----   Message from Daneil Dolin, MD sent at 07/28/2018  4:11 PM EDT ----- Repeatedly poor preps. When he returns in 1 year we need to do something differently.

## 2018-09-03 NOTE — Telephone Encounter (Signed)
Pt is on the recall to return for an OV in 1 year.

## 2018-09-22 DIAGNOSIS — R7303 Prediabetes: Secondary | ICD-10-CM | POA: Diagnosis not present

## 2018-09-22 DIAGNOSIS — I10 Essential (primary) hypertension: Secondary | ICD-10-CM | POA: Diagnosis not present

## 2018-09-22 DIAGNOSIS — M549 Dorsalgia, unspecified: Secondary | ICD-10-CM | POA: Diagnosis not present

## 2018-09-22 DIAGNOSIS — E1165 Type 2 diabetes mellitus with hyperglycemia: Secondary | ICD-10-CM | POA: Diagnosis not present

## 2018-09-22 DIAGNOSIS — G63 Polyneuropathy in diseases classified elsewhere: Secondary | ICD-10-CM | POA: Diagnosis not present

## 2018-10-21 ENCOUNTER — Other Ambulatory Visit (HOSPITAL_COMMUNITY): Payer: Self-pay | Admitting: Internal Medicine

## 2018-10-21 DIAGNOSIS — C20 Malignant neoplasm of rectum: Secondary | ICD-10-CM

## 2018-10-22 ENCOUNTER — Other Ambulatory Visit: Payer: Self-pay

## 2018-10-22 ENCOUNTER — Encounter (HOSPITAL_COMMUNITY): Payer: Self-pay

## 2018-10-22 ENCOUNTER — Inpatient Hospital Stay (HOSPITAL_COMMUNITY): Payer: Medicare Other | Attending: Hematology

## 2018-10-22 DIAGNOSIS — G8929 Other chronic pain: Secondary | ICD-10-CM | POA: Insufficient documentation

## 2018-10-22 DIAGNOSIS — Z9221 Personal history of antineoplastic chemotherapy: Secondary | ICD-10-CM | POA: Insufficient documentation

## 2018-10-22 DIAGNOSIS — Z87891 Personal history of nicotine dependence: Secondary | ICD-10-CM | POA: Diagnosis not present

## 2018-10-22 DIAGNOSIS — Z923 Personal history of irradiation: Secondary | ICD-10-CM | POA: Diagnosis not present

## 2018-10-22 DIAGNOSIS — C20 Malignant neoplasm of rectum: Secondary | ICD-10-CM | POA: Insufficient documentation

## 2018-10-22 LAB — CBC WITH DIFFERENTIAL/PLATELET
Abs Immature Granulocytes: 0.02 10*3/uL (ref 0.00–0.07)
Basophils Absolute: 0 10*3/uL (ref 0.0–0.1)
Basophils Relative: 1 %
Eosinophils Absolute: 0.5 10*3/uL (ref 0.0–0.5)
Eosinophils Relative: 14 %
HCT: 38.2 % — ABNORMAL LOW (ref 39.0–52.0)
Hemoglobin: 12.2 g/dL — ABNORMAL LOW (ref 13.0–17.0)
Immature Granulocytes: 1 %
Lymphocytes Relative: 19 %
Lymphs Abs: 0.7 10*3/uL (ref 0.7–4.0)
MCH: 28.9 pg (ref 26.0–34.0)
MCHC: 31.9 g/dL (ref 30.0–36.0)
MCV: 90.5 fL (ref 80.0–100.0)
Monocytes Absolute: 0.4 10*3/uL (ref 0.1–1.0)
Monocytes Relative: 10 %
Neutro Abs: 2 10*3/uL (ref 1.7–7.7)
Neutrophils Relative %: 55 %
Platelets: 142 10*3/uL — ABNORMAL LOW (ref 150–400)
RBC: 4.22 MIL/uL (ref 4.22–5.81)
RDW: 13 % (ref 11.5–15.5)
WBC: 3.6 10*3/uL — AB (ref 4.0–10.5)
nRBC: 0 % (ref 0.0–0.2)

## 2018-10-22 LAB — COMPREHENSIVE METABOLIC PANEL
ALT: 24 U/L (ref 0–44)
AST: 25 U/L (ref 15–41)
Albumin: 4.2 g/dL (ref 3.5–5.0)
Alkaline Phosphatase: 70 U/L (ref 38–126)
Anion gap: 7 (ref 5–15)
BUN: 11 mg/dL (ref 6–20)
CHLORIDE: 106 mmol/L (ref 98–111)
CO2: 23 mmol/L (ref 22–32)
Calcium: 9 mg/dL (ref 8.9–10.3)
Creatinine, Ser: 1.06 mg/dL (ref 0.61–1.24)
GFR calc Af Amer: >60 mL/min (ref >60)
Glucose, Bld: 102 mg/dL — ABNORMAL HIGH (ref 70–99)
POTASSIUM: 3.7 mmol/L (ref 3.5–5.1)
Sodium: 136 mmol/L (ref 135–145)
Total Bilirubin: 1 mg/dL (ref 0.3–1.2)
Total Protein: 7.8 g/dL (ref 6.5–8.1)

## 2018-10-22 LAB — LACTATE DEHYDROGENASE: LDH: 133 U/L (ref 98–192)

## 2018-10-22 LAB — FERRITIN: Ferritin: 122 ng/mL (ref 24–336)

## 2018-10-22 MED ORDER — HEPARIN SOD (PORK) LOCK FLUSH 100 UNIT/ML IV SOLN
500.0000 [IU] | Freq: Once | INTRAVENOUS | Status: AC
  Administered 2018-10-22: 500 [IU] via INTRAVENOUS

## 2018-10-22 MED ORDER — SODIUM CHLORIDE 0.9% FLUSH
10.0000 mL | INTRAVENOUS | Status: DC | PRN
  Administered 2018-10-22: 10 mL via INTRAVENOUS
  Filled 2018-10-22: qty 10

## 2018-10-22 NOTE — Progress Notes (Signed)
[name] presented for Portacath access and flush.  Proper placement of portacath confirmed by CXR.  Portacath located left chest wall accessed with  H 20 needle.  Good blood return present. Portacath flushed with 54ml NS and 500U/78ml Heparin and needle removed intact.  Procedure tolerated well and without incident.  Discharged ambulatory.

## 2018-10-23 LAB — CEA: CEA: 5.6 ng/mL — ABNORMAL HIGH (ref 0.0–4.7)

## 2018-10-25 ENCOUNTER — Other Ambulatory Visit: Payer: Self-pay | Admitting: Nurse Practitioner

## 2018-11-06 ENCOUNTER — Ambulatory Visit (HOSPITAL_COMMUNITY)
Admission: RE | Admit: 2018-11-06 | Discharge: 2018-11-06 | Disposition: A | Discharge status: Home / Self Care | Payer: Medicare Other | Source: Ambulatory Visit | Attending: Internal Medicine | Admitting: Internal Medicine

## 2018-11-06 DIAGNOSIS — C20 Malignant neoplasm of rectum: Secondary | ICD-10-CM | POA: Diagnosis not present

## 2018-11-06 DIAGNOSIS — C218 Malignant neoplasm of overlapping sites of rectum, anus and anal canal: Secondary | ICD-10-CM | POA: Diagnosis not present

## 2018-11-06 MED ORDER — IOPAMIDOL (ISOVUE-300) INJECTION 61%
100.0000 mL | Freq: Once | INTRAVENOUS | Status: AC | PRN
  Administered 2018-11-06: 100 mL via INTRAVENOUS

## 2018-11-10 ENCOUNTER — Other Ambulatory Visit: Payer: Self-pay

## 2018-11-10 ENCOUNTER — Encounter (HOSPITAL_COMMUNITY): Payer: Self-pay | Admitting: Internal Medicine

## 2018-11-10 ENCOUNTER — Inpatient Hospital Stay (HOSPITAL_BASED_OUTPATIENT_CLINIC_OR_DEPARTMENT_OTHER): Payer: Medicare Other | Admitting: Internal Medicine

## 2018-11-10 VITALS — BP 105/67 | HR 103 | Temp 98.3°F | Resp 16 | Wt 197.0 lb

## 2018-11-10 DIAGNOSIS — Z87891 Personal history of nicotine dependence: Secondary | ICD-10-CM

## 2018-11-10 DIAGNOSIS — Z9221 Personal history of antineoplastic chemotherapy: Secondary | ICD-10-CM

## 2018-11-10 DIAGNOSIS — Z923 Personal history of irradiation: Secondary | ICD-10-CM | POA: Diagnosis not present

## 2018-11-10 DIAGNOSIS — C20 Malignant neoplasm of rectum: Secondary | ICD-10-CM | POA: Diagnosis not present

## 2018-11-10 DIAGNOSIS — G8929 Other chronic pain: Secondary | ICD-10-CM

## 2018-11-10 NOTE — Patient Instructions (Signed)
[facility]  Discharge Instructions: You saw Dr. Higgs today                               _______________________________________________________________  Thank you for choosing [facility] at [facility] to provide your oncology and hematology care.  To afford each patient quality time with our providers, please arrive at least 15 minutes before your scheduled appointment.  You need to re-schedule your appointment if you arrive 10 or more minutes late.  We strive to give you quality time with our providers, and arriving late affects you and other patients whose appointments are after yours.  Also, if you no show three or more times for appointments you may be dismissed from the clinic.  Again, thank you for choosing [facility] at [facility]. Our hope is that these requests will allow you access to exceptional care and in a timely manner. _______________________________________________________________  If you have questions after your visit, please contact our office at [phone] between the hours of 8:30 a.m. and 5:00 p.m. Voicemails left after 4:30 p.m. will not be returned until the following business day. _______________________________________________________________  For prescription refill requests, have your pharmacy contact our office. _______________________________________________________________  Recommendations made by the consultant and any test results will be sent to your referring physician. _______________________________________________________________ 

## 2018-11-10 NOTE — Progress Notes (Signed)
Diagnosis Rectal [name] ([place]) - Plan: CBC with Differential/Platelet, Comprehensive metabolic panel, Lactate dehydrogenase, CEA  Staging [name] Staging Rectal [name] [facility]) Staging form: Colon and Rectum, AJCC 7th Edition - Pathologic stage from 09/12/2015: Stage IIIA (T1, N1a, cM0) - Signed by Douglas Cancer, PA-C on 09/25/2015 - Clinical stage from 09/26/2015: Stage IIIA (T1, N1a, M0) - Signed by Douglas Cancer, PA-C on 09/25/2015   Assessment and Plan: 1.   Stage IIIA adenocarcinoma of the rectum, diagnosed in 03/2015; treated with lower anterior resection (Dr. Leighton [name]), followed by adjuvant chemo with FOLFOX x 6 cycles, concurrent chemoradiation with 5-FU, then completed 2 additional cycles of FOLOX to complete 6 months of adjuvant chemo; completed treatment on 02/13/16. He presents to [name] for continued follow-up.   Labs done 10/22/2018 reviewed and showed WBC 3.6 HB 12.2 plts 142,000.  Chemistries WNl with K+ 3.7 Cr 1.06 and normal LFTs.  Ferritin 122 and Cea 5.6.    CT CAP done 11/06/2018 reviewed and showed  IMPRESSION: No acute findings. No evidence of recurrent or metastatic carcinoma.  Stable hepatic steatosis.  I discussed with pt he is now approaching 4 years in 03/2019 from his original diagnosis.  CT scan shows no evidence of recurrent disease.  He will RTC in 04/2019 with labs and follow-up.  Pt will have repeat imaging in 10/2019.    2.  Chronic pain from low back and hip.  Pain is not felt to be related to [name] based on negative scan done 10/2018.  Pt was being followed by pain management and PCP.  He should follow-up with their office as directed.  Pt was given option of neurosurgery or ortho evaluation.    3  HTN.  BP is 105/67.  Follow-up with PCP.    25 minutes spent with more than 50% spent in counseling and coordination of care.   PATHOLOGY:  Surgical path     Interval History:  Historical data obtained from note dated 12/26/2017.  Stage IIIA  adenocarcinoma of the rectum, diagnosed in 03/2015; treated with lower anterior resection (Dr. Leighton [name]), followed by adjuvant chemo with FOLFOX x 6 cycles, concurrent chemoradiation with 5-FU, then completed 2 additional cycles of FOLOX to complete 6 months of adjuvant chemo; completed treatment on 02/13/16.   Current Status:  Pt is seen today for follow-up.  He is here to go over labs and scans.  He continues to complain of chronic back and hip pain.      Rectal [name] ([place])   03/31/2015 Initial Biopsy    Rectum, biopsy, rectal mass - INVASIVE ADENOCARCINOMA    06/02/2015 Tumor Marker    Results for Douglas, [name] (MRN [id number]) as of 08/28/2015 09:09  06/02/2015 16:00 CEA: 4.3     06/08/2015 Imaging    CT abd/pelvis-Rectal primary, without evidence of metastatic disease or acute complication.    06/24/2015 Imaging    CT chest- No specific features identified to suggest metastatic disease to the chest.    07/06/2015 Definitive Surgery    Robotic-assisted lower anterior resrction, rigid proctoscopy [name])- INVASIVE ADENOCARCINOMA, 2 cm, TUMOR INVADES INTO SUBMUCOSA. LVI/PNI (+).  1/14 LN (+). Margins neg.     07/06/2015 Miscellaneous    IHC Tumor Expression Results normal.  Negative for MLH1, MSH2, MSH6, & PMS2.     07/06/2015 Pathologic Stage    pT1, pN1a, pMx    08/15/2015 - 11/16/2015 Chemotherapy    FOLFOX x 6 cycles (adjuvant chemo "phase 1")  09/26/2015 Treatment Plan Change    Treatment held today.  5FU bolus D/C'd.  Neulasta OnPro added to each subsequent cycles of therapy.    10/04/2015 Treatment Plan Change    Neulasta onpro added to all subsequent cycles of treatment.    10/18/2015 Treatment Plan Change    Tx held for thrombocytopenia.  5FU CI is reduced by 20% for subsequent treatments.    12/05/2015 - 01/16/2016 Chemotherapy    5FU continuous infusion weekly x 6 cycles; concurrent radiation. (adjuvant chemo "phase 2")    12/05/2015 - 01/18/2016 Radiation Therapy     Pelvic radiation completed in [name]).  Pelvis 45 Gy in 25 fractions.  Then cone down boost treatment for additional 5.4 Gy in 8 fractions.  Total dose: 50.4 Gy in 33 treatments    01/30/2016 - 02/13/2016 Chemotherapy    FOLFOX x 2 cycles to complete 6 months of adjuvant chemotherapy.  (adjuvant chemo "phase 3")    05/21/2016 Procedure    Colonoscopy by Dr. Gala [name]- The procedure was aborted due to inadequate bowel prep. Patient readily admits to eating at least applesauce yesterday which was in conflict with our written and verbal preparation instructions. No specimens collected.    06/19/2016 Imaging    CT abd/pelvis- Postsurgical changes of the rectum. New presacral soft tissue thickening likely post treatment related. Recommend attention on followup. Large amount of stool in the colon compatible with constipation. No evidence for distant metastatic dz.     Imaging    CT abd/pelvis: IMPRESSION: Stable exam. No evidence of recurrent for metastatic carcinoma within the abdomen or pelvis.  Mild decrease in large colonic stool burden since prior study. No acute findings      Problem List Patient Active Problem List   Diagnosis Date Noted  . History of colorectal [name] [Z85.038]   . History of rectal [name] [Z85.048] 05/09/2016  . HTN (hypertension) [I10] 11/30/2015  . Rectal [name] ([place]) [C20] 07/06/2015  . History of colonic polyps [Z86.010]   . Diverticulosis of colon without hemorrhage [K57.30]   . Taking medication for chronic disease [R69] 03/24/2015  . Encounter for screening colonoscopy [Z12.11] 03/24/2015    Past Medical History Past Medical History:  Diagnosis Date  . Arthritis   . [name] ([place])    rectal  . Depression   . Diabetes mellitus    "Borderline"  . GERD (gastroesophageal reflux disease)    No weakness  . High cholesterol   . Hypertension   . Neuropathy    "back missed up"  . Rectal [name] ([place])   . Sleep apnea     Past Surgical History Past  Surgical History:  Procedure Laterality Date  . BACK SURGERY    . BIOPSY  03/31/2015   Procedure: BIOPSY;  Surgeon: Douglas Dolin, MD;  Location: [facility];  Service: Endoscopy;;  . CHOLECYSTECTOMY    . COLONOSCOPY WITH PROPOFOL N/A 03/31/2015   Procedure: COLONOSCOPY WITH PROPOFOL at cecum 0842; withdrawal time=68mnutes;  Surgeon: RDaneil Dolin MD;  Location: [facility];  Service: Endoscopy;  Laterality: N/A;  . COLONOSCOPY WITH PROPOFOL N/A 05/21/2016   Procedure: COLONOSCOPY WITH PROPOFOL;  Surgeon: RDaneil Dolin MD;  Location: [facility];  Service: Endoscopy;  Laterality: N/A;  200 - moved to 12:45 - office calling pt with 11:15 arrival time  . COLONOSCOPY WITH PROPOFOL N/A 07/26/2016   Procedure: COLONOSCOPY WITH PROPOFOL;  Surgeon: RDaneil Dolin MD;  [facility];  Service: Endoscopy;  Laterality: N/A;  115  . COLONOSCOPY WITH PROPOFOL N/A 07/28/2018   Procedure: COLONOSCOPY WITH PROPOFOL;  Surgeon: Douglas Dolin, MD;  [facility]: [facility];  Service: Endoscopy;  Laterality: N/A;  1:45pm - unable to reach pt to move up  . FLEXIBLE SIGMOIDOSCOPY N/A 07/05/2015   Procedure: FLEXIBLE SIGMOIDOSCOPY;  Surgeon: Douglas Ruff, MD;  Location: [facility];  Service: Endoscopy;  Laterality: N/A;  with tattoo  . POLYPECTOMY  03/31/2015   Procedure: POLYPECTOMY;  Surgeon: Douglas Dolin, MD;  Location: [facility];  Service: Endoscopy;;  . PORTACATH PLACEMENT N/A 08/09/2015   Procedure: INSERTION PORT-A-CATH LEFT SUBCLAVIAN;  Surgeon: Douglas Ruff, MD;  Location: [facility];  Service: General;  Laterality: N/A;  . XI ROBOTIC ASSISTED LOWER ANTERIOR RESECTION N/A 07/06/2015   Procedure: XI ROBOTIC ASSISTED LOWER ANTERIOR RESECTION, rigid proctoscopy;  Surgeon: Douglas Ruff, MD;  Location: [facility];  Service: General;  Laterality: N/A;    Family History Family History  Problem Relation Age of Onset  . Hypertension Unknown        "Not sure who, I don't know much about my family"  . Diabetes Unknown         "Not sure who, I don't know much about my family"  . Colon [name] Neg Hx        "Not that I know of"  . Colon polyps Neg Hx        "not that I know of"     Social History  reports that he quit smoking about 12 years ago. His smoking use included cigarettes. He has a 0.50 pack-year smoking history. He has never used smokeless tobacco. He reports that he does not drink alcohol or use drugs.  Medications  Current Outpatient Medications:  .  traMADol (ULTRAM) 50 MG tablet, Take 50 mg by mouth 3 (three) times daily., Disp: , Rfl:  .  ACCU-CHEK FASTCLIX LANCETS MISC, , Disp: , Rfl:  .  ACCU-CHEK SMARTVIEW test strip, , Disp: , Rfl:  .  Alcohol Swabs (B-D SINGLE USE SWABS REGULAR) PADS, , Disp: , Rfl:  .  amLODipine (NORVASC) 10 MG tablet, Take 10 mg by mouth daily. , Disp: , Rfl:  .  gabapentin (NEURONTIN) 800 MG tablet, Take 1 tablet (800 mg total) by mouth 3 (three) times daily. (Patient taking differently: Take 800 mg by mouth 2 (two) times daily. ), Disp: 90 tablet, Rfl: 2 .  lisinopril (PRINIVIL,ZESTRIL) 40 MG tablet, Take 1 tablet (40 mg total) by mouth every morning., Disp: 30 tablet, Rfl: 2 .  NON FORMULARY, PT HAS A C-PAP MACHINE, Disp: , Rfl:  .  nortriptyline (PAMELOR) 75 MG capsule, Take 1 capsule (75 mg total) by mouth at bedtime., Disp: 30 capsule, Rfl: 2 .  tiZANidine (ZANAFLEX) 2 MG tablet, Take 1 tablet (2 mg total) by mouth every 8 (eight) hours as needed for muscle spasms. (Patient taking differently: Take 2 mg by mouth 2 (two) times daily. ), Disp: 30 tablet, Rfl: 2  Allergies Patient has no known allergies.  Review of Systems Review of Systems - Oncology ROS negative other than back and hip pain   Physical Exam  Vitals Wt Readings from Last 3 Encounters:  11/10/18 197 lb (89.4 kg)  07/28/18 201 lb 15.1 oz (91.6 kg)  06/25/18 202 lb (91.6 kg)   Temp Readings from Last 3 Encounters:  11/10/18 98.3 F (36.8 C) (Oral)  10/22/18 98.2 F (36.8 C) (Oral)    07/28/18 97.7 F (36.5 C) (Oral)  BP Readings from Last 3 Encounters:  11/10/18 105/67  10/22/18 124/73  07/28/18 134/83   Pulse Readings from Last 3 Encounters:  11/10/18 (!) 103  10/22/18 98  07/28/18 88   Constitutional: Well-developed, well-nourished, and in no distress.   HENT: Head: Normocephalic and atraumatic.  Mouth/Throat: No oropharyngeal exudate. Mucosa moist. Eyes: Pupils are equal, round, and reactive to light. Conjunctivae are normal. No scleral icterus.  Neck: Normal range of motion. Neck supple. No JVD present.  Cardiovascular: Normal rate, regular rhythm and normal heart sounds.  Exam reveals no gallop and no friction rub.   No murmur heard. Pulmonary/Chest: Effort normal and breath sounds normal. No respiratory distress. No wheezes.No rales.  Abdominal: Soft. Bowel sounds are normal. No distension. There is no tenderness. There is no guarding.  Musculoskeletal: No edema or tenderness.  Lymphadenopathy: No cervical, axillary or supraclavicular adenopathy.  Neurological: Alert and oriented to person, place, and time. No cranial nerve deficit.  Skin: Skin is warm and dry. No rash noted. No erythema. No pallor.  Psychiatric: Affect and judgment normal.   Labs No visits with results within 3 Day(s) from this visit.  Latest known visit with results is:  Infusion on 10/22/2018  Component Date Value Ref Range Status  . CEA 10/22/2018 5.6* 0.0 - 4.7 ng/mL Final   Comment: (NOTE)                             Nonsmokers          <3.9                             Smokers             <5.6 [employer] Electrochemiluminescence Immunoassay (ECLIA) Values obtained with different assay methods or kits cannot be used interchangeably.  Results cannot be interpreted as absolute evidence of the presence or absence of malignant disease. Performed At: [facility] [place], [place] [id number] Rush Farmer MD [id number]   . Ferritin  10/22/2018 122  24 - 336 ng/mL Final   Performed at [facility], [address] [place], [place] [address]  . LDH 10/22/2018 133  98 - 192 U/L Final   Performed at [facility], [address], [place], [name] [address]  . Sodium 10/22/2018 136  135 - 145 mmol/L Final  . Potassium 10/22/2018 3.7  3.5 - 5.1 mmol/L Final  . Chloride 10/22/2018 106  98 - 111 mmol/L Final  . CO2 10/22/2018 23  22 - 32 mmol/L Final  . Glucose, Bld 10/22/2018 102* 70 - 99 mg/dL Final  . BUN 10/22/2018 11  6 - 20 mg/dL Final  . Creatinine, Ser 10/22/2018 1.06  0.61 - 1.24 mg/dL Final  . Calcium 10/22/2018 9.0  8.9 - 10.3 mg/dL Final  . Total Protein 10/22/2018 7.8  6.5 - 8.1 g/dL Final  . Albumin 10/22/2018 4.2  3.5 - 5.0 g/dL Final  . AST 10/22/2018 25  15 - 41 U/L Final  . ALT 10/22/2018 24  0 - 44 U/L Final  . Alkaline Phosphatase 10/22/2018 70  38 - 126 U/L Final  . Total Bilirubin 10/22/2018 1.0  0.3 - 1.2 mg/dL Final  . GFR calc non Af Amer 10/22/2018 >60  >60 mL/min Final  . GFR calc Af Amer 10/22/2018 >60  >60 mL/min Final  . Anion gap 10/22/2018 7  5 -  15 Final   Performed at [facility], [address], [place], [place] [address]  . WBC 10/22/2018 3.6* 4.0 - 10.5 K/uL Final  . RBC 10/22/2018 4.22  4.22 - 5.81 MIL/uL Final  . Hemoglobin 10/22/2018 12.2* 13.0 - 17.0 g/dL Final  . HCT 10/22/2018 38.2* 39.0 - 52.0 % Final  . MCV 10/22/2018 90.5  80.0 - 100.0 fL Final  . MCH 10/22/2018 28.9  26.0 - 34.0 pg Final  . MCHC 10/22/2018 31.9  30.0 - 36.0 g/dL Final  . RDW 10/22/2018 13.0  11.5 - 15.5 % Final  . Platelets 10/22/2018 142* 150 - 400 K/uL Final  . nRBC 10/22/2018 0.0  0.0 - 0.2 % Final  . Neutrophils Relative % 10/22/2018 55  % Final  . Neutro Abs 10/22/2018 2.0  1.7 - 7.7 K/uL Final  . Lymphocytes Relative 10/22/2018 19  % Final  . Lymphs Abs 10/22/2018 0.7  0.7 - 4.0 K/uL Final  . Monocytes Relative 10/22/2018 10  % Final  . Monocytes Absolute 10/22/2018 0.4  0.1 - 1.0 K/uL Final  .  Eosinophils Relative 10/22/2018 14  % Final  . Eosinophils Absolute 10/22/2018 0.5  0.0 - 0.5 K/uL Final  . Basophils Relative 10/22/2018 1  % Final  . Basophils Absolute 10/22/2018 0.0  0.0 - 0.1 K/uL Final  . Immature Granulocytes 10/22/2018 1  % Final  . Abs Immature Granulocytes 10/22/2018 0.02  0.00 - 0.07 K/uL Final   Performed at [facility], [address], [place], [place] [address]     Pathology Orders Placed This Encounter  Procedures  . CBC with Differential/Platelet    Standing Status:   Future    Standing Expiration Date:   11/10/2020  . Comprehensive metabolic panel    Standing Status:   Future    Standing Expiration Date:   11/10/2020  . Lactate dehydrogenase    Standing Status:   Future    Standing Expiration Date:   11/10/2020  . CEA    Standing Status:   Future    Standing Expiration Date:   11/10/2020       Zoila Shutter MD

## 2018-11-11 ENCOUNTER — Ambulatory Visit (HOSPITAL_COMMUNITY): Payer: Medicare HMO | Admitting: Internal Medicine

## 2018-11-24 DIAGNOSIS — G894 Chronic pain syndrome: Secondary | ICD-10-CM | POA: Diagnosis not present

## 2018-11-24 DIAGNOSIS — Z79891 Long term (current) use of opiate analgesic: Secondary | ICD-10-CM | POA: Diagnosis not present

## 2018-11-24 DIAGNOSIS — M79605 Pain in left leg: Secondary | ICD-10-CM | POA: Diagnosis not present

## 2018-11-24 DIAGNOSIS — M545 Low back pain: Secondary | ICD-10-CM | POA: Diagnosis not present

## 2018-11-24 DIAGNOSIS — Z79899 Other long term (current) drug therapy: Secondary | ICD-10-CM | POA: Diagnosis not present

## 2018-12-22 DIAGNOSIS — Z0000 Encounter for general adult medical examination without abnormal findings: Secondary | ICD-10-CM | POA: Diagnosis not present

## 2018-12-22 DIAGNOSIS — G894 Chronic pain syndrome: Secondary | ICD-10-CM | POA: Diagnosis not present

## 2018-12-22 DIAGNOSIS — M79605 Pain in left leg: Secondary | ICD-10-CM | POA: Diagnosis not present

## 2018-12-22 DIAGNOSIS — R7303 Prediabetes: Secondary | ICD-10-CM | POA: Diagnosis not present

## 2018-12-22 DIAGNOSIS — E785 Hyperlipidemia, unspecified: Secondary | ICD-10-CM | POA: Diagnosis not present

## 2018-12-22 DIAGNOSIS — E119 Type 2 diabetes mellitus without complications: Secondary | ICD-10-CM | POA: Diagnosis not present

## 2018-12-22 DIAGNOSIS — M545 Low back pain: Secondary | ICD-10-CM | POA: Diagnosis not present

## 2018-12-22 DIAGNOSIS — Z79891 Long term (current) use of opiate analgesic: Secondary | ICD-10-CM | POA: Diagnosis not present

## 2018-12-22 DIAGNOSIS — Z79899 Other long term (current) drug therapy: Secondary | ICD-10-CM | POA: Diagnosis not present

## 2018-12-22 DIAGNOSIS — Z1389 Encounter for screening for other disorder: Secondary | ICD-10-CM | POA: Diagnosis not present

## 2018-12-22 DIAGNOSIS — I10 Essential (primary) hypertension: Secondary | ICD-10-CM | POA: Diagnosis not present

## 2018-12-24 ENCOUNTER — Other Ambulatory Visit (HOSPITAL_COMMUNITY): Payer: Self-pay | Admitting: Pain Medicine

## 2018-12-24 DIAGNOSIS — M545 Low back pain, unspecified: Secondary | ICD-10-CM

## 2018-12-30 ENCOUNTER — Ambulatory Visit (HOSPITAL_COMMUNITY)
Admission: RE | Admit: 2018-12-30 | Discharge: 2018-12-30 | Disposition: A | Discharge status: Home / Self Care | Payer: Medicare Other | Source: Ambulatory Visit | Attending: Pain Medicine | Admitting: Pain Medicine

## 2018-12-30 DIAGNOSIS — M545 Low back pain, unspecified: Secondary | ICD-10-CM

## 2019-01-05 ENCOUNTER — Other Ambulatory Visit: Payer: Self-pay | Admitting: Pain Medicine

## 2019-01-05 ENCOUNTER — Other Ambulatory Visit (HOSPITAL_COMMUNITY): Payer: Self-pay | Admitting: Pain Medicine

## 2019-01-06 DIAGNOSIS — M5417 Radiculopathy, lumbosacral region: Secondary | ICD-10-CM | POA: Diagnosis not present

## 2019-01-12 DIAGNOSIS — M5136 Other intervertebral disc degeneration, lumbar region: Secondary | ICD-10-CM | POA: Diagnosis not present

## 2019-01-12 DIAGNOSIS — M5417 Radiculopathy, lumbosacral region: Secondary | ICD-10-CM | POA: Diagnosis not present

## 2019-01-12 DIAGNOSIS — M5416 Radiculopathy, lumbar region: Secondary | ICD-10-CM | POA: Diagnosis not present

## 2019-01-20 DIAGNOSIS — M47816 Spondylosis without myelopathy or radiculopathy, lumbar region: Secondary | ICD-10-CM | POA: Diagnosis not present

## 2019-01-20 DIAGNOSIS — G894 Chronic pain syndrome: Secondary | ICD-10-CM | POA: Diagnosis not present

## 2019-01-20 DIAGNOSIS — M79605 Pain in left leg: Secondary | ICD-10-CM | POA: Diagnosis not present

## 2019-01-21 ENCOUNTER — Inpatient Hospital Stay (HOSPITAL_COMMUNITY): Payer: Medicare Other | Attending: Hematology

## 2019-01-21 DIAGNOSIS — C20 Malignant neoplasm of rectum: Secondary | ICD-10-CM | POA: Diagnosis not present

## 2019-01-21 DIAGNOSIS — Z452 Encounter for adjustment and management of vascular access device: Secondary | ICD-10-CM | POA: Insufficient documentation

## 2019-01-21 MED ORDER — HEPARIN SOD (PORK) LOCK FLUSH 100 UNIT/ML IV SOLN
500.0000 [IU] | Freq: Once | INTRAVENOUS | Status: AC
  Administered 2019-01-21: 500 [IU] via INTRAVENOUS
  Filled 2019-01-21: qty 5

## 2019-01-21 MED ORDER — SODIUM CHLORIDE 0.9% FLUSH
10.0000 mL | INTRAVENOUS | Status: DC | PRN
  Administered 2019-01-21: 10 mL via INTRAVENOUS
  Filled 2019-01-21: qty 10

## 2019-02-17 DIAGNOSIS — M79605 Pain in left leg: Secondary | ICD-10-CM | POA: Diagnosis not present

## 2019-02-17 DIAGNOSIS — G894 Chronic pain syndrome: Secondary | ICD-10-CM | POA: Diagnosis not present

## 2019-02-17 DIAGNOSIS — Z79899 Other long term (current) drug therapy: Secondary | ICD-10-CM | POA: Diagnosis not present

## 2019-02-17 DIAGNOSIS — Z79891 Long term (current) use of opiate analgesic: Secondary | ICD-10-CM | POA: Diagnosis not present

## 2019-02-17 DIAGNOSIS — M545 Low back pain: Secondary | ICD-10-CM | POA: Diagnosis not present

## 2019-02-17 DIAGNOSIS — M47816 Spondylosis without myelopathy or radiculopathy, lumbar region: Secondary | ICD-10-CM | POA: Diagnosis not present

## 2019-03-17 DIAGNOSIS — M47816 Spondylosis without myelopathy or radiculopathy, lumbar region: Secondary | ICD-10-CM | POA: Diagnosis not present

## 2019-03-17 DIAGNOSIS — Z79891 Long term (current) use of opiate analgesic: Secondary | ICD-10-CM | POA: Diagnosis not present

## 2019-03-17 DIAGNOSIS — G894 Chronic pain syndrome: Secondary | ICD-10-CM | POA: Diagnosis not present

## 2019-03-17 DIAGNOSIS — M545 Low back pain: Secondary | ICD-10-CM | POA: Diagnosis not present

## 2019-03-17 DIAGNOSIS — Z79899 Other long term (current) drug therapy: Secondary | ICD-10-CM | POA: Diagnosis not present

## 2019-03-17 DIAGNOSIS — M79605 Pain in left leg: Secondary | ICD-10-CM | POA: Diagnosis not present

## 2019-03-24 DIAGNOSIS — I10 Essential (primary) hypertension: Secondary | ICD-10-CM | POA: Diagnosis not present

## 2019-03-24 DIAGNOSIS — G63 Polyneuropathy in diseases classified elsewhere: Secondary | ICD-10-CM | POA: Diagnosis not present

## 2019-03-24 DIAGNOSIS — E785 Hyperlipidemia, unspecified: Secondary | ICD-10-CM | POA: Diagnosis not present

## 2019-03-24 DIAGNOSIS — G4733 Obstructive sleep apnea (adult) (pediatric): Secondary | ICD-10-CM | POA: Diagnosis not present

## 2019-04-14 DIAGNOSIS — Z79891 Long term (current) use of opiate analgesic: Secondary | ICD-10-CM | POA: Diagnosis not present

## 2019-04-14 DIAGNOSIS — Z79899 Other long term (current) drug therapy: Secondary | ICD-10-CM | POA: Diagnosis not present

## 2019-04-14 DIAGNOSIS — M545 Low back pain: Secondary | ICD-10-CM | POA: Diagnosis not present

## 2019-04-14 DIAGNOSIS — M47816 Spondylosis without myelopathy or radiculopathy, lumbar region: Secondary | ICD-10-CM | POA: Diagnosis not present

## 2019-04-14 DIAGNOSIS — M79605 Pain in left leg: Secondary | ICD-10-CM | POA: Diagnosis not present

## 2019-04-14 DIAGNOSIS — G894 Chronic pain syndrome: Secondary | ICD-10-CM | POA: Diagnosis not present

## 2019-05-02 ENCOUNTER — Encounter (HOSPITAL_COMMUNITY): Payer: Self-pay | Admitting: *Deleted

## 2019-05-02 ENCOUNTER — Other Ambulatory Visit: Payer: Self-pay

## 2019-05-02 ENCOUNTER — Emergency Department (HOSPITAL_COMMUNITY)
Admission: EM | Admit: 2019-05-02 | Discharge: 2019-05-02 | Disposition: A | Discharge status: Home / Self Care | Payer: Medicare Other | Attending: Emergency Medicine | Admitting: Emergency Medicine

## 2019-05-02 ENCOUNTER — Emergency Department (HOSPITAL_COMMUNITY): Payer: Medicare Other

## 2019-05-02 DIAGNOSIS — E119 Type 2 diabetes mellitus without complications: Secondary | ICD-10-CM | POA: Insufficient documentation

## 2019-05-02 DIAGNOSIS — M5432 Sciatica, left side: Secondary | ICD-10-CM

## 2019-05-02 DIAGNOSIS — M545 Low back pain, unspecified: Secondary | ICD-10-CM

## 2019-05-02 DIAGNOSIS — Z87891 Personal history of nicotine dependence: Secondary | ICD-10-CM | POA: Insufficient documentation

## 2019-05-02 DIAGNOSIS — I10 Essential (primary) hypertension: Secondary | ICD-10-CM | POA: Insufficient documentation

## 2019-05-02 DIAGNOSIS — M5442 Lumbago with sciatica, left side: Secondary | ICD-10-CM | POA: Insufficient documentation

## 2019-05-02 DIAGNOSIS — Z79899 Other long term (current) drug therapy: Secondary | ICD-10-CM | POA: Diagnosis not present

## 2019-05-02 DIAGNOSIS — Z85038 Personal history of other malignant neoplasm of large intestine: Secondary | ICD-10-CM | POA: Diagnosis not present

## 2019-05-02 DIAGNOSIS — R0902 Hypoxemia: Secondary | ICD-10-CM | POA: Diagnosis not present

## 2019-05-02 DIAGNOSIS — G8929 Other chronic pain: Secondary | ICD-10-CM | POA: Diagnosis not present

## 2019-05-02 DIAGNOSIS — R52 Pain, unspecified: Secondary | ICD-10-CM | POA: Diagnosis not present

## 2019-05-02 DIAGNOSIS — M546 Pain in thoracic spine: Secondary | ICD-10-CM | POA: Diagnosis not present

## 2019-05-02 DIAGNOSIS — S3992XA Unspecified injury of lower back, initial encounter: Secondary | ICD-10-CM | POA: Diagnosis not present

## 2019-05-02 DIAGNOSIS — S79912A Unspecified injury of left hip, initial encounter: Secondary | ICD-10-CM | POA: Diagnosis not present

## 2019-05-02 MED ORDER — HYDROMORPHONE HCL 1 MG/ML IJ SOLN
1.0000 mg | Freq: Once | INTRAMUSCULAR | Status: AC
  Administered 2019-05-02: 1 mg via INTRAMUSCULAR
  Filled 2019-05-02: qty 1

## 2019-05-02 NOTE — ED Triage Notes (Signed)
Patient presents to ED via [facility] with back pain extending to left leg for two days.  Patient able to transfer self from stretcher to bed.

## 2019-05-02 NOTE — ED Triage Notes (Signed)
Patient presents to the ED via EMS, for back pain extending to left leg for two days.  Patient able to transfer self from stretcher to bed in ED.

## 2019-05-02 NOTE — Discharge Instructions (Addendum)
1. Medications: usual home medications for back pain 2. Treatment: rest, drink plenty of fluids, gentle stretching as discussed, alternate ice and heat 3. Follow Up: Please followup with your pain management provider and Dr. Christella Noa for further discussion about your back pain; Return to the ER for worsening back pain, difficulty walking, loss of bowel or bladder control or other concerning symptoms

## 2019-05-02 NOTE — ED Provider Notes (Signed)
[facility] EMERGENCY DEPARTMENT Provider Note   CSN: [id number] Arrival date & time: 05/02/19  1435    History   Chief Complaint Chief Complaint  Patient presents with  . Back Pain    HPI [name] is a 58 y.o. male with a hx of arthritis, colon/rectal cancer (in remission - last scan 10/2018 without signs of recurence), NIDDM, GERD, neuropathy left leg presents to the Emergency Department complaining of gradual, persistent, progressively worsening left sided low back pain with radiation to the left leg and foot.  Pt reports chronic pain at this site.  He has a hx of lumbar surgery 10 years ago with Dr. Christella Noa and has been in pain management since that time. Pt reports taking Gabapentin, Tizanidine and hydrocodone for pain daily.  He reports these medications are not helping his pain in the last 3 days. Pt reports his last dose of medication was at 1pm today. He reports decreased sensation in his left leg is baseline and a sequelae of his previous surgery.  Pt denies loss of bowel or bladder control, urinary retention.  Pt reports his pain has been so intense in his left leg that he has fallen 2x in the last 3 days.  He reports he is able to walk with his cane but rates his pain at a 10/10 when attempting to do so.  Movement makes his pain worse and lying on his left side makes it better. Record review does not show any previous ED visits for back pain.      The history is provided by the patient and medical records. No language interpreter was used.    Past Medical History:  Diagnosis Date  . Arthritis   . Cancer ([place])    rectal  . Depression   . Diabetes mellitus    "Borderline"  . GERD (gastroesophageal reflux disease)    No weakness  . High cholesterol   . Hypertension   . Neuropathy    "back missed up"  . Rectal cancer ([place])   . Sleep apnea     Patient Active Problem List   Diagnosis Date Noted  . History of colorectal cancer   . History of rectal cancer  05/09/2016  . HTN (hypertension) 11/30/2015  . Rectal cancer ([place]) 07/06/2015  . History of colonic polyps   . Diverticulosis of colon without hemorrhage   . Taking medication for chronic disease 03/24/2015  . Encounter for screening colonoscopy 03/24/2015    Past Surgical History:  Procedure Laterality Date  . BACK SURGERY    . BIOPSY  03/31/2015   Procedure: BIOPSY;  Surgeon: Daneil Dolin, MD;  Location: [facility];  Service: Endoscopy;;  . CHOLECYSTECTOMY    . COLONOSCOPY WITH PROPOFOL N/A 03/31/2015   Procedure: COLONOSCOPY WITH PROPOFOL at cecum 0842; withdrawal time=73minutes;  Surgeon: Daneil Dolin, MD;  Location: [facility];  Service: Endoscopy;  Laterality: N/A;  . COLONOSCOPY WITH PROPOFOL N/A 05/21/2016   Procedure: COLONOSCOPY WITH PROPOFOL;  Surgeon: Daneil Dolin, MD;  Location: [facility];  Service: Endoscopy;  Laterality: N/A;  200 - moved to 12:45 - office calling pt with 11:15 arrival time  . COLONOSCOPY WITH PROPOFOL N/A 07/26/2016   Procedure: COLONOSCOPY WITH PROPOFOL;  Surgeon: Daneil Dolin, MD;  Location: [facility];  Service: Endoscopy;  Laterality: N/A;  115  . COLONOSCOPY WITH PROPOFOL N/A 07/28/2018   Procedure: COLONOSCOPY WITH PROPOFOL;  Surgeon: Daneil Dolin, MD;  Location: [facility];  Service: Endoscopy;  Laterality: N/A;  1:45pm - unable to reach pt to move up  . FLEXIBLE SIGMOIDOSCOPY N/A 07/05/2015   Procedure: FLEXIBLE SIGMOIDOSCOPY;  Surgeon: [name], MD;  Location: [facility];  Service: Endoscopy;  Laterality: N/A;  with tattoo  . POLYPECTOMY  03/31/2015   Procedure: POLYPECTOMY;  Surgeon: Daneil Dolin, MD;  Location: [facility];  Service: Endoscopy;;  . PORTACATH PLACEMENT N/A 08/09/2015   Procedure: INSERTION PORT-A-CATH LEFT SUBCLAVIAN;  Surgeon: [name], MD;  Location: [place];  Service: General;  Laterality: N/A;  . XI ROBOTIC ASSISTED LOWER ANTERIOR RESECTION N/A 07/06/2015   Procedure: XI ROBOTIC ASSISTED LOWER ANTERIOR RESECTION,  rigid proctoscopy;  Surgeon: [name], MD;  Location: [facility];  Service: General;  Laterality: N/A;        Home Medications    Prior to Admission medications   Medication Sig Start Date End Date Taking? Authorizing Provider  ACCU-CHEK FASTCLIX LANCETS [place]  06/12/18   [provider]  ACCU-CHEK SMARTVIEW test strip  06/12/18   [provider]  Alcohol Swabs (B-D SINGLE USE SWABS REGULAR) PADS  06/12/18   [provider]  amLODipine (NORVASC) 10 MG tablet Take 10 mg by mouth daily.  12/05/17   [provider]  gabapentin (NEURONTIN) 800 MG tablet Take 1 tablet (800 mg total) by mouth 3 (three) times daily. Patient taking differently: Take 800 mg by mouth 2 (two) times daily.  11/30/15   Penland, Kelby Fam, MD  lisinopril (PRINIVIL,ZESTRIL) 40 MG tablet Take 1 tablet (40 mg total) by mouth every morning. 11/30/15   Penland, Kelby Fam, MD  NON FORMULARY PT HAS A C-PAP MACHINE    [provider]  nortriptyline (PAMELOR) 75 MG capsule Take 1 capsule (75 mg total) by mouth at bedtime. 11/30/15   Penland, Kelby Fam, MD  tiZANidine (ZANAFLEX) 2 MG tablet Take 1 tablet (2 mg total) by mouth every 8 (eight) hours as needed for muscle spasms. Patient taking differently: Take 2 mg by mouth 2 (two) times daily.  11/30/15   Penland, Kelby Fam, MD  traMADol (ULTRAM) 50 MG tablet Take 50 mg by mouth 3 (three) times daily.    [provider]  prochlorperazine (COMPAZINE) 10 MG tablet Take 1 tablet (10 mg total) by mouth every 6 (six) hours as needed (Nausea or vomiting). Patient not taking: Reported on 10/06/2015 08/05/15 12/05/15  Penland, Kelby Fam, MD    Family History Family History  Problem Relation Age of Onset  . Hypertension Other        "Not sure who, I don't know much about my family"  . Diabetes Other        "Not sure who, I don't know much about my family"  . Colon cancer Neg Hx        "Not that I know of"  . Colon polyps Neg Hx         "not that I know of"    Social History Social History   Tobacco Use  . Smoking status: Former Smoker    Packs/day: 0.25    Years: 2.00    Pack years: 0.50    Types: Cigarettes    Quit date: 03/23/2006    Years since quitting: 13.1  . Smokeless tobacco: Never Used  Substance Use Topics  . Alcohol use: No    Alcohol/week: 0.0 standard drinks  . Drug use: No     Allergies   Patient has no known allergies.   Review  of Systems Review of Systems  Constitutional: Negative for appetite change, diaphoresis, fatigue, fever and unexpected weight change.  HENT: Negative for mouth sores.   Eyes: Negative for visual disturbance.  Respiratory: Negative for cough, chest tightness, shortness of breath and wheezing.   Cardiovascular: Negative for chest pain.  Gastrointestinal: Negative for abdominal pain, constipation, diarrhea, nausea and vomiting.  Endocrine: Negative for polydipsia, polyphagia and polyuria.  Genitourinary: Negative for dysuria, frequency, hematuria and urgency.  Musculoskeletal: Positive for back pain and gait problem (2/2 pain). Negative for neck stiffness.  Skin: Negative for rash.  Allergic/Immunologic: Negative for immunocompromised state.  Neurological: Negative for syncope, light-headedness and headaches.  Hematological: Does not bruise/bleed easily.  Psychiatric/Behavioral: Negative for sleep disturbance. The patient is not nervous/anxious.      Physical Exam Updated Vital Signs BP 107/67   Pulse 90   Resp 15   Ht 5\' 2"  (1.575 m)   SpO2 94%   BMI 36.03 kg/m   Physical Exam Vitals signs and nursing note reviewed.  Constitutional:      General: He is not in acute distress.    Appearance: He is well-developed. He is not diaphoretic.  HENT:     Head: Normocephalic and atraumatic.     Mouth/Throat:     Pharynx: No oropharyngeal exudate.  Eyes:     Conjunctiva/sclera: Conjunctivae normal.  Neck:     Musculoskeletal: Normal range of motion and neck  supple.     Comments: Full ROM without pain Cardiovascular:     Rate and Rhythm: Normal rate and regular rhythm.  Pulmonary:     Effort: Pulmonary effort is normal. No respiratory distress.     Breath sounds: Normal breath sounds. No wheezing.  Abdominal:     General: There is no distension.     Palpations: Abdomen is soft.     Tenderness: There is no abdominal tenderness.  Musculoskeletal:     Comments: Slightly decreased range of motion of the T-spine and L-spine due to pain Mild midline tenderness to the  Lower T-spine or L-spine Moderate tenderness to palpation of the left paraspinous muscles of the L-spine. Well healed surgical incisions to the L-spine.  Lymphadenopathy:     Cervical: No cervical adenopathy.  Skin:    General: Skin is warm and dry.     Findings: No erythema or rash.  Neurological:     Mental Status: He is alert.     Comments: Speech is clear and goal oriented, follows commands Normal 5/5 strength in upper and lower extremities bilaterally including dorsiflexion and plantar flexion, strong and equal grip strength Sensation normal to light and sharp touch Moves extremities without ataxia, coordination intact Antalgic gait - pt uses cane but is able to walk without additional assistance Normal balance No Clonus  Psychiatric:        Behavior: Behavior normal.      ED Treatments / Results  Labs (all labs ordered are listed, but only abnormal results are displayed) Labs Reviewed - No data to display  EKG None  Radiology Dg Thoracic Spine 2 View  Result Date: 05/02/2019 CLINICAL DATA:  58 year old male with a history of back pain EXAM: THORACIC SPINE 2 VIEWS COMPARISON:  CT 11/06/2018 FINDINGS: Thoracic Spine: Thoracic vertebral elements maintain normal anatomic alignment, with no evidence of anterolisthesis, retrolisthesis, or subluxation. No acute fracture line identified. Vertebral body heights maintained. Mild endplate changes throughout the thoracic  spine compatible with degenerative disc disease. Unremarkable appearance of the visualized thorax. Port catheter from  left chest subclavian approach is unchanged and incompletely imaged IMPRESSION: Negative for acute fracture or malalignment of the thoracic spine. Mild degenerative changes of the thoracic spine. Electronically Signed   By: Corrie Mckusick D.O.   On: 05/02/2019 16:16   Dg Lumbar Spine Complete  Result Date: 05/02/2019 CLINICAL DATA:  Back pain post fall EXAM: LUMBAR SPINE - COMPLETE 4+ VIEW COMPARISON:  MR lumbar spine 12/30/2018 FINDINGS: Five lumbar vertebra. Scattered mild disc space narrowing and endplate spur formation. Osseous mineralization normal. Vertebral body heights maintained without fracture or subluxation. No bone destruction or spondylolysis. SI joints preserved. Advanced arthritic changes of the LEFT hip joint. IMPRESSION: Degenerative disc disease changes lumbar spine without acute abnormalities. 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 Electronically Signed   By: Lavonia Dana M.D.   On: 05/02/2019 16:19   Dg Hip Unilat W Or Wo Pelvis 2-3 Views Left  Result Date: 05/02/2019 CLINICAL DATA:  Back pain extending into LEFT leg for 2 days, fall EXAM: DG HIP (WITH OR WITHOUT PELVIS) 2-3V LEFT COMPARISON:  None FINDINGS: Osseous demineralization. Minimal narrowing of the RIGHT hip joint. SI joint spaces preserved. Advanced osteoarthritic changes of the LEFT hip joint with joint space narrowing and spur formation. Bone-on-bone appearance and subchondral cystic change present at the LEFT hip as well. Mild superolateral subluxation of the LEFT humeral head. No fracture, dislocation, or bone destruction. 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 Electronically Signed   By: Lavonia Dana M.D.   On: 05/02/2019 16:20    Procedures Procedures (including critical care time)  Medications Ordered in ED Medications  HYDROmorphone (DILAUDID) injection 1  mg (1 mg Intramuscular Given 05/02/19 1605)     Initial Impression / Assessment and Plan / ED Course  I have reviewed the triage vital signs and the nursing notes.  Pertinent labs & imaging results that were available during my care of the patient were reviewed by me and considered in my medical decision making (see chart for details).  Clinical Course as of May 02 1747  Sat May 02, 2019  1745 Pt reports he is feeling much better.  Imaging is without significant change from his chronic degeneration. No acute fractures.   [HM]    Clinical Course User Index [HM] Amel Kitch, Jarrett Soho, PA-C        Pt presents with acute on chronic back pain and several falls over the last few days.  He is able to ambulate with a cane here in the ED.  No foot drop or dragging of the left leg. No weakness in the left leg on exam, though some decreased effort due to pain.   Given hx of colon cancer and recent falls will obtain imaging and give pain control.  Pt does have f/u and plan for CT scan on 6/16/202 for colon and rectal cancer follow-up.    5:46 PM Patient reports feeling much better at this time.  He is moving around with more ease.  I have personally evaluated the plain films of his T-spine, L-spine and hip.  No fractures or evidence of bony metastasis.  Degeneration is noted however patient has a history of this.  Patient is without saddle anesthesia, leg weakness, loss of bowel or bladder control.  Doubt cauda equina at this time.  Patient received a 30-day supply of his hydrocodone on 04/16/2019.  I have concern for inappropriate usage as he reports he is almost out.  Will not write any additional hydrocodone at this  time.  He will need to contact his pain control specialist about this.  Additionally, patient given follow-up instructions for neurosurgery for further discussion of his worsening back pain.  Discussed reasons to return immediately to the emergency department.  Patient states understanding  and is in agreement with the plan.  Final Clinical Impressions(s) / ED Diagnoses   Final diagnoses:  Acute exacerbation of chronic low back pain  Sciatica, left side    ED Discharge Orders    None       Hera Celaya, Gwenlyn Perking 05/02/19 [place], [place], DO 05/06/19 1456

## 2019-05-04 ENCOUNTER — Other Ambulatory Visit (HOSPITAL_COMMUNITY): Payer: Self-pay | Admitting: *Deleted

## 2019-05-04 DIAGNOSIS — C20 Malignant neoplasm of rectum: Secondary | ICD-10-CM

## 2019-05-05 ENCOUNTER — Other Ambulatory Visit: Payer: Self-pay

## 2019-05-05 ENCOUNTER — Inpatient Hospital Stay (HOSPITAL_COMMUNITY): Payer: Medicare Other | Attending: Hematology

## 2019-05-05 DIAGNOSIS — I10 Essential (primary) hypertension: Secondary | ICD-10-CM | POA: Insufficient documentation

## 2019-05-05 DIAGNOSIS — Z9221 Personal history of antineoplastic chemotherapy: Secondary | ICD-10-CM | POA: Diagnosis not present

## 2019-05-05 DIAGNOSIS — E119 Type 2 diabetes mellitus without complications: Secondary | ICD-10-CM | POA: Insufficient documentation

## 2019-05-05 DIAGNOSIS — M1990 Unspecified osteoarthritis, unspecified site: Secondary | ICD-10-CM | POA: Diagnosis not present

## 2019-05-05 DIAGNOSIS — G4730 Sleep apnea, unspecified: Secondary | ICD-10-CM | POA: Diagnosis not present

## 2019-05-05 DIAGNOSIS — Z79899 Other long term (current) drug therapy: Secondary | ICD-10-CM | POA: Insufficient documentation

## 2019-05-05 DIAGNOSIS — C20 Malignant neoplasm of rectum: Secondary | ICD-10-CM | POA: Insufficient documentation

## 2019-05-05 DIAGNOSIS — Z87891 Personal history of nicotine dependence: Secondary | ICD-10-CM | POA: Diagnosis not present

## 2019-05-05 DIAGNOSIS — Z923 Personal history of irradiation: Secondary | ICD-10-CM | POA: Insufficient documentation

## 2019-05-05 LAB — CBC WITH DIFFERENTIAL/PLATELET
Abs Immature Granulocytes: 0.03 10*3/uL (ref 0.00–0.07)
Basophils Absolute: 0 10*3/uL (ref 0.0–0.1)
Basophils Relative: 1 %
Eosinophils Absolute: 0.7 10*3/uL — ABNORMAL HIGH (ref 0.0–0.5)
Eosinophils Relative: 16 %
HCT: 42.2 % (ref 39.0–52.0)
Hemoglobin: 13.8 g/dL (ref 13.0–17.0)
Immature Granulocytes: 1 %
Lymphocytes Relative: 25 %
Lymphs Abs: 1.1 10*3/uL (ref 0.7–4.0)
MCH: 29.9 pg (ref 26.0–34.0)
MCHC: 32.7 g/dL (ref 30.0–36.0)
MCV: 91.3 fL (ref 80.0–100.0)
Monocytes Absolute: 0.4 10*3/uL (ref 0.1–1.0)
Monocytes Relative: 10 %
Neutro Abs: 2.2 10*3/uL (ref 1.7–7.7)
Neutrophils Relative %: 47 %
Platelets: 184 10*3/uL (ref 150–400)
RBC: 4.62 MIL/uL (ref 4.22–5.81)
RDW: 12.6 % (ref 11.5–15.5)
WBC: 4.5 10*3/uL (ref 4.0–10.5)
nRBC: 0 % (ref 0.0–0.2)

## 2019-05-05 LAB — COMPREHENSIVE METABOLIC PANEL
ALT: 38 U/L (ref 0–44)
AST: 30 U/L (ref 15–41)
Albumin: 4.3 g/dL (ref 3.5–5.0)
Alkaline Phosphatase: 75 U/L (ref 38–126)
Anion gap: 10 (ref 5–15)
BUN: 16 mg/dL (ref 6–20)
CO2: 26 mmol/L (ref 22–32)
Calcium: 9.4 mg/dL (ref 8.9–10.3)
Chloride: 104 mmol/L (ref 98–111)
Creatinine, Ser: 1.33 mg/dL — ABNORMAL HIGH (ref 0.61–1.24)
GFR calc Af Amer: >60 mL/min (ref >60)
GFR calc non Af Amer: 59 mL/min — ABNORMAL LOW (ref >60)
Glucose, Bld: 108 mg/dL — ABNORMAL HIGH (ref 70–99)
Potassium: 4 mmol/L (ref 3.5–5.1)
Sodium: 140 mmol/L (ref 135–145)
Total Bilirubin: 0.5 mg/dL (ref 0.3–1.2)
Total Protein: 8 g/dL (ref 6.5–8.1)

## 2019-05-05 LAB — LACTATE DEHYDROGENASE: LDH: 162 U/L (ref 98–192)

## 2019-05-06 LAB — CEA: CEA: 6.9 ng/mL — ABNORMAL HIGH (ref 0.0–4.7)

## 2019-05-12 ENCOUNTER — Encounter (HOSPITAL_COMMUNITY): Payer: Self-pay | Admitting: Hematology

## 2019-05-12 ENCOUNTER — Other Ambulatory Visit: Payer: Self-pay

## 2019-05-12 ENCOUNTER — Inpatient Hospital Stay (HOSPITAL_BASED_OUTPATIENT_CLINIC_OR_DEPARTMENT_OTHER): Payer: Medicare Other | Admitting: Hematology

## 2019-05-12 VITALS — BP 113/78 | HR 102 | Temp 98.3°F | Resp 18 | Wt 208.1 lb

## 2019-05-12 DIAGNOSIS — C20 Malignant neoplasm of rectum: Secondary | ICD-10-CM | POA: Diagnosis not present

## 2019-05-12 DIAGNOSIS — G4730 Sleep apnea, unspecified: Secondary | ICD-10-CM

## 2019-05-12 DIAGNOSIS — Z79899 Other long term (current) drug therapy: Secondary | ICD-10-CM | POA: Diagnosis not present

## 2019-05-12 DIAGNOSIS — Z85048 Personal history of other malignant neoplasm of rectum, rectosigmoid junction, and anus: Secondary | ICD-10-CM

## 2019-05-12 DIAGNOSIS — Z923 Personal history of irradiation: Secondary | ICD-10-CM

## 2019-05-12 DIAGNOSIS — I10 Essential (primary) hypertension: Secondary | ICD-10-CM | POA: Diagnosis not present

## 2019-05-12 DIAGNOSIS — Z9221 Personal history of antineoplastic chemotherapy: Secondary | ICD-10-CM | POA: Diagnosis not present

## 2019-05-12 DIAGNOSIS — G894 Chronic pain syndrome: Secondary | ICD-10-CM | POA: Diagnosis not present

## 2019-05-12 DIAGNOSIS — M1990 Unspecified osteoarthritis, unspecified site: Secondary | ICD-10-CM | POA: Diagnosis not present

## 2019-05-12 DIAGNOSIS — E119 Type 2 diabetes mellitus without complications: Secondary | ICD-10-CM | POA: Diagnosis not present

## 2019-05-12 DIAGNOSIS — Z87891 Personal history of nicotine dependence: Secondary | ICD-10-CM

## 2019-05-12 DIAGNOSIS — M79605 Pain in left leg: Secondary | ICD-10-CM | POA: Diagnosis not present

## 2019-05-12 DIAGNOSIS — M47816 Spondylosis without myelopathy or radiculopathy, lumbar region: Secondary | ICD-10-CM | POA: Diagnosis not present

## 2019-05-12 DIAGNOSIS — Z79891 Long term (current) use of opiate analgesic: Secondary | ICD-10-CM | POA: Diagnosis not present

## 2019-05-12 DIAGNOSIS — M545 Low back pain: Secondary | ICD-10-CM | POA: Diagnosis not present

## 2019-05-12 NOTE — Progress Notes (Signed)
[place], [place] [id number]   CLINIC:  Medical Oncology/Hematology  PCP:  Rosita Fire, MD [place] [address] [phone]   REASON FOR VISIT:  Follow-up for  Rectal Adenocarcinoma  CURRENT THERAPY: Clinical Surveillance   BRIEF ONCOLOGIC HISTORY:  Oncology History  Rectal cancer ([place])  03/31/2015 Initial Biopsy   Rectum, biopsy, rectal mass - INVASIVE ADENOCARCINOMA   06/02/2015 Tumor Marker   Results for GEORGIOS, KINA (MRN [id number]) as of 08/28/2015 09:09  06/02/2015 16:00 CEA: 4.3    06/08/2015 Imaging   CT abd/pelvis-Rectal primary, without evidence of metastatic disease or acute complication.   06/24/2015 Imaging   CT chest- No specific features identified to suggest metastatic disease to the chest.   07/06/2015 Definitive Surgery   Robotic-assisted lower anterior resrction, rigid proctoscopy [name])- INVASIVE ADENOCARCINOMA, 2 cm, TUMOR INVADES INTO SUBMUCOSA. LVI/PNI (+).  1/14 LN (+). Margins neg.    07/06/2015 Miscellaneous   IHC Tumor Expression Results normal.  Negative for MLH1, MSH2, MSH6, & PMS2.    07/06/2015 Pathologic Stage   pT1, pN1a, pMx   08/15/2015 - 11/16/2015 Chemotherapy   FOLFOX x 6 cycles (adjuvant chemo "phase 1")   09/26/2015 Treatment Plan Change   Treatment held today.  5FU bolus D/C'd.  Neulasta OnPro added to each subsequent cycles of therapy.   10/04/2015 Treatment Plan Change   Neulasta onpro added to all subsequent cycles of treatment.   10/18/2015 Treatment Plan Change   Tx held for thrombocytopenia.  5FU CI is reduced by 20% for subsequent treatments.   12/05/2015 - 01/16/2016 Chemotherapy   5FU continuous infusion weekly x 6 cycles; concurrent radiation. (adjuvant chemo "phase 2")   12/05/2015 - 01/18/2016 Radiation Therapy   Pelvic radiation completed in [name]).  Pelvis 45 Gy in 25 fractions.  Then cone down boost treatment for additional 5.4 Gy in 8 fractions.   Total dose: 50.4 Gy in 33 treatments   01/30/2016 - 02/13/2016 Chemotherapy   FOLFOX x 2 cycles to complete 6 months of adjuvant chemotherapy.  (adjuvant chemo "phase 3")   05/21/2016 Procedure   Colonoscopy by Dr. Gala Romney- The procedure was aborted due to inadequate bowel prep. Patient readily admits to eating at least applesauce yesterday which was in conflict with our written and verbal preparation instructions. No specimens collected.   06/19/2016 Imaging   CT abd/pelvis- Postsurgical changes of the rectum. New presacral soft tissue thickening likely post treatment related. Recommend attention on followup. Large amount of stool in the colon compatible with constipation. No evidence for distant metastatic dz.    Imaging   CT abd/pelvis: IMPRESSION: Stable exam. No evidence of recurrent for metastatic carcinoma within the abdomen or pelvis.  Mild decrease in large colonic stool burden since prior study. No acute findings      CANCER STAGING: Cancer Staging Rectal cancer [facility]) Staging form: Colon and Rectum, AJCC 7th Edition - Pathologic stage from 09/12/2015: Stage IIIA (T1, N1a, cM0) - Signed by [name], PA-C on 09/25/2015 - Clinical stage from 09/26/2015: Stage IIIA (T1, N1a, M0) - Signed by [name], PA-C on 09/25/2015    INTERVAL HISTORY:  Mr. Dieudonne 58 y.o. male presents today for follow up. He reports overall doing well. He denies any changes in bowel habits. No abdominal pain. No blood in his stool. His last colonoscopy was in Sept 2019 and was negative for disease. His main concern today is chronic back pain, which is  managed by Ortho. He is here for follow up and labs.  Denies any nausea, vomiting or diarrhea.  Denies any ER visits or hospitalizations.   REVIEW OF SYSTEMS:  Review of Systems  Constitutional: Negative.   HENT:  Negative.   Eyes: Negative.   Respiratory: Negative.   Cardiovascular: Negative.   Gastrointestinal: Negative.   Endocrine:  Negative.   Genitourinary: Negative.    Musculoskeletal: Positive for arthralgias, back pain and gait problem.  Skin: Negative.   Neurological: Positive for extremity weakness and gait problem.  Psychiatric/Behavioral: Negative.      PAST MEDICAL/SURGICAL HISTORY:  Past Medical History:  Diagnosis Date   Arthritis    Cancer ([place])    rectal   Depression    Diabetes mellitus    "Borderline"   GERD (gastroesophageal reflux disease)    No weakness   High cholesterol    Hypertension    Neuropathy    "back missed up"   Rectal cancer [facility])    Sleep apnea    Past Surgical History:  Procedure Laterality Date   BACK SURGERY     BIOPSY  03/31/2015   Procedure: BIOPSY;  Surgeon: Daneil Dolin, MD;  Location: [facility];  Service: Endoscopy;;   CHOLECYSTECTOMY     COLONOSCOPY WITH PROPOFOL N/A 03/31/2015   Procedure: COLONOSCOPY WITH PROPOFOL at cecum 0842; withdrawal time=41mnutes;  Surgeon: RDaneil Dolin MD;  Location: [facility];  Service: Endoscopy;  Laterality: N/A;   COLONOSCOPY WITH PROPOFOL N/A 05/21/2016   Procedure: COLONOSCOPY WITH PROPOFOL;  Surgeon: RDaneil Dolin MD;  Location: [facility];  Service: Endoscopy;  Laterality: N/A;  200 - moved to 12:45 - office calling pt with 11:15 arrival time   COLONOSCOPY WITH PROPOFOL N/A 07/26/2016   Procedure: COLONOSCOPY WITH PROPOFOL;  Surgeon: RDaneil Dolin MD;  [facility];  Service: Endoscopy;  Laterality: N/A;  115   COLONOSCOPY WITH PROPOFOL N/A 07/28/2018   Procedure: COLONOSCOPY WITH PROPOFOL;  Surgeon: RDaneil Dolin MD;  Location: [facility];  Service: Endoscopy;  Laterality: N/A;  1:45pm - unable to reach pt to move up   [place]   Procedure: FLEXIBLE SIGMOIDOSCOPY;  Surgeon: ALeighton Ruff MD;  Location: [facility];  Service: Endoscopy;  Laterality: N/A;  with tattoo   POLYPECTOMY  03/31/2015   Procedure: POLYPECTOMY;  Surgeon: RDaneil Dolin MD;  Location: [facility]  ORS;  Service: Endoscopy;;   PORTACATH PLACEMENT N/A 08/09/2015   Procedure: INSERTION PORT-A-CATH LEFT SUBCLAVIAN;  Surgeon: ALeighton Ruff MD;  [place]  Service: General;  Laterality: N/A;   XI ROBOTIC ASSISTED LOWER ANTERIOR RESECTION N/A 07/06/2015   Procedure: XI ROBOTIC ASSISTED LOWER ANTERIOR RESECTION, rigid proctoscopy;  Surgeon: ALeighton Ruff MD;  Location: [facility];  Service: General;  Laterality: N/A;     SOCIAL HISTORY:  Social History   Socioeconomic History   Marital status: Divorced    Spouse name: Not on file   Number of children: Not on file   Years of education: Not on file   Highest education level: Not on file  Occupational History   Not on file  Social Needs   Financial resource strain: Not on file   Food insecurity    Worry: Not on file    Inability: Not on file   Transportation needs    Medical: Not on file    Non-medical: Not on file  Tobacco Use   Smoking status: Former Smoker    Packs/day:  0.25    Years: 2.00    Pack years: 0.50    Types: Cigarettes    Quit date: 03/23/2006    Years since quitting: 13.1   Smokeless tobacco: Never Used  Substance and Sexual Activity   Alcohol use: No    Alcohol/week: 0.0 standard drinks   Drug use: No   Sexual activity: Not Currently  Lifestyle   Physical activity    Days per week: Not on file    Minutes per session: Not on file   Stress: Not on file  Relationships   Social connections    Talks on phone: Not on file    Gets together: Not on file    Attends religious service: Not on file    Active member of club or organization: Not on file    Attends meetings of clubs or organizations: Not on file    Relationship status: Not on file   Intimate partner violence    Fear of current or ex partner: Not on file    Emotionally abused: Not on file    Physically abused: Not on file    Forced sexual activity: Not on file  Other Topics Concern   Not on file  Social History Narrative     Not on file    FAMILY HISTORY:  Family History  Problem Relation Age of Onset   Hypertension Other        "Not sure who, I don't know much about my family"   Diabetes Other        "Not sure who, I don't know much about my family"   Colon cancer Neg Hx        "Not that I know of"   Colon polyps Neg Hx        "not that I know of"    CURRENT MEDICATIONS:  Outpatient Encounter Medications as of 05/12/2019  Medication Sig   ACCU-CHEK FASTCLIX LANCETS MISC    ACCU-CHEK SMARTVIEW test strip    Alcohol Swabs (B-D SINGLE USE SWABS REGULAR) PADS    amLODipine (NORVASC) 10 MG tablet Take 10 mg by mouth daily.    gabapentin (NEURONTIN) 800 MG tablet Take 1 tablet (800 mg total) by mouth 3 (three) times daily. (Patient taking differently: Take 800 mg by mouth 2 (two) times daily. )   HYDROcodone-acetaminophen (NORCO) 7.5-325 MG tablet TAKE 1 TABLET BY MOUTH SIX TIMES DAILY   lisinopril (PRINIVIL,ZESTRIL) 40 MG tablet Take 1 tablet (40 mg total) by mouth every morning.   NON FORMULARY PT HAS A C-PAP MACHINE   nortriptyline (PAMELOR) 75 MG capsule Take 1 capsule (75 mg total) by mouth at bedtime.   tiZANidine (ZANAFLEX) 2 MG tablet Take 1 tablet (2 mg total) by mouth every 8 (eight) hours as needed for muscle spasms. (Patient taking differently: Take 2 mg by mouth 2 (two) times daily. )   traMADol (ULTRAM) 50 MG tablet Take 50 mg by mouth 3 (three) times daily.   [DISCONTINUED] prochlorperazine (COMPAZINE) 10 MG tablet Take 1 tablet (10 mg total) by mouth every 6 (six) hours as needed (Nausea or vomiting). (Patient not taking: Reported on 10/06/2015)   No facility-administered encounter medications on file as of 05/12/2019.     ALLERGIES:  No Known Allergies   PHYSICAL EXAM:  ECOG Performance status: 2  Vitals:   05/12/19 0800  BP: 113/78  Pulse: (!) 102  Resp: 18  Temp: 98.3 F (36.8 C)  SpO2: 99%   Filed Weights  05/12/19 0800  Weight: 208 lb 1 oz (94.4 kg)     Physical Exam Constitutional:      Appearance: Normal appearance. He is obese.  HENT:     Head: Normocephalic.     Nose: Nose normal.     Mouth/Throat:     Mouth: Mucous membranes are moist.     Pharynx: Oropharynx is clear.  Eyes:     Extraocular Movements: Extraocular movements intact.     Conjunctiva/sclera: Conjunctivae normal.  Neck:     Musculoskeletal: Normal range of motion.  Cardiovascular:     Rate and Rhythm: Normal rate and regular rhythm.     Pulses: Normal pulses.     Heart sounds: Normal heart sounds.  Pulmonary:     Effort: Pulmonary effort is normal.     Breath sounds: Normal breath sounds.  Abdominal:     General: Bowel sounds are normal.     Palpations: Abdomen is soft.  Musculoskeletal: Normal range of motion.  Skin:    General: Skin is warm and dry.  Neurological:     General: No focal deficit present.     Mental Status: He is alert and oriented to person, place, and time.  Psychiatric:        Mood and Affect: Mood normal.        Behavior: Behavior normal.        Thought Content: Thought content normal.        Judgment: Judgment normal.      LABORATORY DATA:  I have reviewed the labs as listed.  CBC    Component Value Date/Time   WBC 4.5 05/05/2019 1009   RBC 4.62 05/05/2019 1009   HGB 13.8 05/05/2019 1009   HCT 42.2 05/05/2019 1009   PLT 184 05/05/2019 1009   MCV 91.3 05/05/2019 1009   MCH 29.9 05/05/2019 1009   MCHC 32.7 05/05/2019 1009   RDW 12.6 05/05/2019 1009   LYMPHSABS 1.1 05/05/2019 1009   MONOABS 0.4 05/05/2019 1009   EOSABS 0.7 (H) 05/05/2019 1009   BASOSABS 0.0 05/05/2019 1009   CMP Latest Ref Rng & Units 05/05/2019 10/22/2018 06/25/2018  Glucose 70 - 99 mg/dL 108(H) 102(H) 105(H)  BUN 6 - 20 mg/dL '16 11 18  '$ Creatinine 0.61 - 1.24 mg/dL 1.33(H) 1.06 1.05  Sodium 135 - 145 mmol/L 140 136 139  Potassium 3.5 - 5.1 mmol/L 4.0 3.7 3.7  Chloride 98 - 111 mmol/L 104 106 109  CO2 22 - 32 mmol/L '26 23 25  '$ Calcium 8.9 - 10.3  mg/dL 9.4 9.0 9.2  Total Protein 6.5 - 8.1 g/dL 8.0 7.8 7.8  Total Bilirubin 0.3 - 1.2 mg/dL 0.5 1.0 0.7  Alkaline Phos 38 - 126 U/L 75 70 69  AST 15 - 41 U/L '30 25 26  '$ ALT 0 - 44 U/L 38 24 35     Radiology: I have independently reviewed his CT scan images from December 2019.    ASSESSMENT & PLAN:   Rectal cancer ([place]) 1.  Stage III (PT1PN1A) rectal adenocarcinoma: - Status post segmental resection of the rectosigmoid cancer on 07/06/2015, pathology showing 1/14 lymph node positive, margins negative, MMR normal. - 6 cycles of adjuvant FOLFOX from 08/15/2015 through 11/06/2015 followed by 5-FU and XRT from 12/05/2015 through 01/16/2016 followed by 2 more cycles of FOLFOX from 01/30/2016 through 02/13/2016. -Last colonoscopy on 07/28/2018 showing diverticulosis of the sigmoid colon and descending colon. -CT CAP on 11/06/2018 shows no evidence of recurrence or metastatic disease.  Stable hepatic  steatosis. - We have reviewed his labs.  CEA is elevated at 6.9.  LFTs were normal.  His CEA has been mildly elevated at prior visits. - Have recommended repeat restaging CT CAP to rule out any disease reoccurrence or metastatic disease.  - RTC after CT scan.   2.  Chronic hip pain: - This is from osteoarthritis.  He is using hydrocodone 7.5 mg as needed.  Total time spent is 25 minutes with more than 50% of the time spent face-to-face discussing lab results, further work-up and counseling and coordination of care.    Orders placed this encounter:  Orders Placed This Encounter  Procedures   CT Chest W Contrast   CT Abdomen Pelvis W Contrast   CBC with Differential   Comprehensive metabolic panel   CEA      Derek Jack, MD [place] [phone]

## 2019-05-12 NOTE — Assessment & Plan Note (Addendum)
1.  Stage III (PT1PN1A) rectal adenocarcinoma: - Status post segmental resection of the rectosigmoid cancer on 07/06/2015, pathology showing 1/14 lymph node positive, margins negative, MMR normal. - 6 cycles of adjuvant FOLFOX from 08/15/2015 through 11/06/2015 followed by 5-FU and XRT from 12/05/2015 through 01/16/2016 followed by 2 more cycles of FOLFOX from 01/30/2016 through 02/13/2016. -Last colonoscopy on 07/28/2018 showing diverticulosis of the sigmoid colon and descending colon. -CT CAP on 11/06/2018 shows no evidence of recurrence or metastatic disease.  Stable hepatic steatosis. - We have reviewed his labs.  CEA is elevated at 6.9.  LFTs were normal.  His CEA has been mildly elevated at prior visits. - Have recommended repeat restaging CT CAP to rule out any disease reoccurrence or metastatic disease.  - RTC after CT scan.   2.  Chronic hip pain: - This is from osteoarthritis.  He is using hydrocodone 7.5 mg as needed.

## 2019-05-26 ENCOUNTER — Encounter (HOSPITAL_COMMUNITY): Payer: Medicare Other

## 2019-05-27 ENCOUNTER — Other Ambulatory Visit: Payer: Self-pay

## 2019-05-27 ENCOUNTER — Ambulatory Visit (HOSPITAL_COMMUNITY)
Admission: RE | Admit: 2019-05-27 | Discharge: 2019-05-27 | Disposition: A | Discharge status: Home / Self Care | Payer: Medicare Other | Source: Ambulatory Visit | Attending: Hematology | Admitting: Hematology

## 2019-05-27 DIAGNOSIS — Z85048 Personal history of other malignant neoplasm of rectum, rectosigmoid junction, and anus: Secondary | ICD-10-CM

## 2019-05-27 DIAGNOSIS — C189 Malignant neoplasm of colon, unspecified: Secondary | ICD-10-CM | POA: Diagnosis not present

## 2019-05-27 DIAGNOSIS — Z85038 Personal history of other malignant neoplasm of large intestine: Secondary | ICD-10-CM | POA: Diagnosis not present

## 2019-05-27 MED ORDER — IOHEXOL 300 MG/ML  SOLN
100.0000 mL | Freq: Once | INTRAMUSCULAR | Status: AC | PRN
  Administered 2019-05-27: 100 mL via INTRAVENOUS

## 2019-06-01 ENCOUNTER — Inpatient Hospital Stay (HOSPITAL_COMMUNITY): Payer: Medicare Other

## 2019-06-01 ENCOUNTER — Encounter (HOSPITAL_COMMUNITY): Payer: Self-pay | Admitting: Hematology

## 2019-06-01 ENCOUNTER — Inpatient Hospital Stay (HOSPITAL_COMMUNITY): Payer: Medicare Other | Attending: Hematology | Admitting: Hematology

## 2019-06-01 ENCOUNTER — Other Ambulatory Visit: Payer: Self-pay

## 2019-06-01 VITALS — BP 122/82 | HR 99 | Temp 97.5°F | Resp 16 | Wt 205.5 lb

## 2019-06-01 DIAGNOSIS — G4730 Sleep apnea, unspecified: Secondary | ICD-10-CM | POA: Diagnosis not present

## 2019-06-01 DIAGNOSIS — Z87891 Personal history of nicotine dependence: Secondary | ICD-10-CM | POA: Insufficient documentation

## 2019-06-01 DIAGNOSIS — E119 Type 2 diabetes mellitus without complications: Secondary | ICD-10-CM | POA: Insufficient documentation

## 2019-06-01 DIAGNOSIS — Z9221 Personal history of antineoplastic chemotherapy: Secondary | ICD-10-CM

## 2019-06-01 DIAGNOSIS — Z85048 Personal history of other malignant neoplasm of rectum, rectosigmoid junction, and anus: Secondary | ICD-10-CM

## 2019-06-01 DIAGNOSIS — G629 Polyneuropathy, unspecified: Secondary | ICD-10-CM | POA: Insufficient documentation

## 2019-06-01 DIAGNOSIS — Z923 Personal history of irradiation: Secondary | ICD-10-CM | POA: Diagnosis not present

## 2019-06-01 DIAGNOSIS — M549 Dorsalgia, unspecified: Secondary | ICD-10-CM | POA: Diagnosis not present

## 2019-06-01 DIAGNOSIS — G8929 Other chronic pain: Secondary | ICD-10-CM | POA: Insufficient documentation

## 2019-06-01 DIAGNOSIS — I10 Essential (primary) hypertension: Secondary | ICD-10-CM | POA: Insufficient documentation

## 2019-06-01 DIAGNOSIS — E7800 Pure hypercholesterolemia, unspecified: Secondary | ICD-10-CM

## 2019-06-01 DIAGNOSIS — C20 Malignant neoplasm of rectum: Secondary | ICD-10-CM

## 2019-06-01 DIAGNOSIS — M1990 Unspecified osteoarthritis, unspecified site: Secondary | ICD-10-CM

## 2019-06-01 DIAGNOSIS — K219 Gastro-esophageal reflux disease without esophagitis: Secondary | ICD-10-CM | POA: Insufficient documentation

## 2019-06-01 DIAGNOSIS — Z79899 Other long term (current) drug therapy: Secondary | ICD-10-CM

## 2019-06-01 DIAGNOSIS — M25559 Pain in unspecified hip: Secondary | ICD-10-CM | POA: Insufficient documentation

## 2019-06-01 DIAGNOSIS — Z95828 Presence of other vascular implants and grafts: Secondary | ICD-10-CM

## 2019-06-01 LAB — COMPREHENSIVE METABOLIC PANEL
ALT: 37 U/L (ref 0–44)
AST: 25 U/L (ref 15–41)
Albumin: 3.9 g/dL (ref 3.5–5.0)
Alkaline Phosphatase: 63 U/L (ref 38–126)
Anion gap: 10 (ref 5–15)
BUN: 21 mg/dL — ABNORMAL HIGH (ref 6–20)
CO2: 23 mmol/L (ref 22–32)
Calcium: 9.3 mg/dL (ref 8.9–10.3)
Chloride: 106 mmol/L (ref 98–111)
Creatinine, Ser: 1.27 mg/dL — ABNORMAL HIGH (ref 0.61–1.24)
GFR calc Af Amer: >60 mL/min (ref >60)
GFR calc non Af Amer: >60 mL/min (ref >60)
Glucose, Bld: 96 mg/dL (ref 70–99)
Potassium: 3.7 mmol/L (ref 3.5–5.1)
Sodium: 139 mmol/L (ref 135–145)
Total Bilirubin: 0.5 mg/dL (ref 0.3–1.2)
Total Protein: 7.3 g/dL (ref 6.5–8.1)

## 2019-06-01 LAB — CBC WITH DIFFERENTIAL/PLATELET
Abs Immature Granulocytes: 0.04 10*3/uL (ref 0.00–0.07)
Basophils Absolute: 0 10*3/uL (ref 0.0–0.1)
Basophils Relative: 1 %
Eosinophils Absolute: 0.2 10*3/uL (ref 0.0–0.5)
Eosinophils Relative: 4 %
HCT: 39.9 % (ref 39.0–52.0)
Hemoglobin: 12.6 g/dL — ABNORMAL LOW (ref 13.0–17.0)
Immature Granulocytes: 1 %
Lymphocytes Relative: 21 %
Lymphs Abs: 0.9 10*3/uL (ref 0.7–4.0)
MCH: 28.9 pg (ref 26.0–34.0)
MCHC: 31.6 g/dL (ref 30.0–36.0)
MCV: 91.5 fL (ref 80.0–100.0)
Monocytes Absolute: 0.4 10*3/uL (ref 0.1–1.0)
Monocytes Relative: 10 %
Neutro Abs: 2.7 10*3/uL (ref 1.7–7.7)
Neutrophils Relative %: 63 %
Platelets: 181 10*3/uL (ref 150–400)
RBC: 4.36 MIL/uL (ref 4.22–5.81)
RDW: 12.3 % (ref 11.5–15.5)
WBC: 4.3 10*3/uL (ref 4.0–10.5)
nRBC: 0 % (ref 0.0–0.2)

## 2019-06-01 MED ORDER — SODIUM CHLORIDE 0.9% FLUSH
10.0000 mL | Freq: Once | INTRAVENOUS | Status: AC
  Administered 2019-06-01: 12:00:00 10 mL via INTRAVENOUS

## 2019-06-01 MED ORDER — HEPARIN SOD (PORK) LOCK FLUSH 100 UNIT/ML IV SOLN
500.0000 [IU] | Freq: Once | INTRAVENOUS | Status: AC
  Administered 2019-06-01: 500 [IU] via INTRAVENOUS

## 2019-06-01 NOTE — Progress Notes (Signed)
Patients port flushed without difficulty.  Good blood return noted before and after flush. No complaints of pain with flush and no bruising or swelling noted at site.  Band aid applied.  VSS with discharge and left ambulatory with no s/s of distress noted.  

## 2019-06-01 NOTE — Progress Notes (Signed)
[name], [name] [id number]   CLINIC:  Medical Oncology/Hematology  PCP:  Douglas Fire, MD [name] [address] [phone]   REASON FOR VISIT:  Follow-up for  Rectal Adenocarcinoma  CURRENT THERAPY: Clinical Surveillance   BRIEF ONCOLOGIC HISTORY:  Oncology History  Rectal cancer ([name])  03/31/2015 Initial Biopsy   Rectum, biopsy, rectal mass - INVASIVE ADENOCARCINOMA   06/02/2015 Tumor Marker   Results for [name], [name] (MRN [id number]) as of 08/28/2015 09:09  06/02/2015 16:00 CEA: 4.3    06/08/2015 Imaging   CT abd/pelvis-Rectal primary, without evidence of metastatic disease or acute complication.   06/24/2015 Imaging   CT chest- No specific features identified to suggest metastatic disease to the chest.   07/06/2015 Definitive Surgery   Robotic-assisted lower anterior resrction, rigid proctoscopy [name])- INVASIVE ADENOCARCINOMA, 2 cm, TUMOR INVADES INTO SUBMUCOSA. LVI/PNI (+).  1/14 LN (+). Margins neg.    07/06/2015 Miscellaneous   IHC Tumor Expression Results normal.  Negative for MLH1, MSH2, MSH6, & PMS2.    07/06/2015 Pathologic Stage   pT1, pN1a, pMx   08/15/2015 - 11/16/2015 Chemotherapy   FOLFOX x 6 cycles (adjuvant chemo "phase 1")   09/26/2015 Treatment Plan Change   Treatment held today.  5FU bolus D/C'd.  Neulasta OnPro added to each subsequent cycles of therapy.   10/04/2015 Treatment Plan Change   Neulasta onpro added to all subsequent cycles of treatment.   10/18/2015 Treatment Plan Change   Tx held for thrombocytopenia.  5FU CI is reduced by 20% for subsequent treatments.   12/05/2015 - 01/16/2016 Chemotherapy   5FU continuous infusion weekly x 6 cycles; concurrent radiation. (adjuvant chemo "phase 2")   12/05/2015 - 01/18/2016 Radiation Therapy   Pelvic radiation completed in [place] [facility]).  Pelvis 45 Gy in 25 fractions.  Then cone down boost treatment for additional 5.4 Gy in 8 fractions.   Total dose: 50.4 Gy in 33 treatments   01/30/2016 - 02/13/2016 Chemotherapy   FOLFOX x 2 cycles to complete 6 months of adjuvant chemotherapy.  (adjuvant chemo "phase 3")   05/21/2016 Procedure   Colonoscopy by Dr. Gala [name]- The procedure was aborted due to inadequate bowel prep. Patient readily admits to eating at least applesauce yesterday which was in conflict with our written and verbal preparation instructions. No specimens collected.   06/19/2016 Imaging   CT abd/pelvis- Postsurgical changes of the rectum. New presacral soft tissue thickening likely post treatment related. Recommend attention on followup. Large amount of stool in the colon compatible with constipation. No evidence for distant metastatic dz.    Imaging   CT abd/pelvis: IMPRESSION: Stable exam. No evidence of recurrent for metastatic carcinoma within the abdomen or pelvis.  Mild decrease in large colonic stool burden since prior study. No acute findings      CANCER STAGING: Cancer Staging Rectal cancer [facility]) Staging form: Colon and Rectum, AJCC 7th Edition - Pathologic stage from 09/12/2015: Stage IIIA (T1, N1a, cM0) - Signed by [name], PA-C on 09/25/2015 - Clinical stage from 09/26/2015: Stage IIIA (T1, N1a, M0) - Signed by [name], PA-C on 09/25/2015    INTERVAL HISTORY:  [name] 58 y.o. male presents for follow-up of his rectal cancer.  We have ordered CT scan at last visit as his CEA was elevated.  He is continuing to have some back pain and hip pain and is being followed by orthopedics.  Denies any nausea, vomiting, diarrhea.  Denies  any bowel changes.  Denies any bleeding per rectum or melena.  Denies any ER visits or hospitalizations.   REVIEW OF SYSTEMS:  Review of Systems  Constitutional: Negative.   HENT:  Negative.   Eyes: Negative.   Respiratory: Negative.   Cardiovascular: Negative.   Gastrointestinal: Negative.   Endocrine: Negative.   Genitourinary: Negative.    Skin:  Negative.   Neurological: Positive for numbness.  Psychiatric/Behavioral: Negative.   All other systems reviewed and are negative.    PAST MEDICAL/SURGICAL HISTORY:  Past Medical History:  Diagnosis Date  . Arthritis   . Cancer ([place])    rectal  . Depression   . Diabetes mellitus    "Borderline"  . GERD (gastroesophageal reflux disease)    No weakness  . High cholesterol   . Hypertension   . Neuropathy    "back missed up"  . Rectal cancer ([place])   . Sleep apnea    Past Surgical History:  Procedure Laterality Date  . BACK SURGERY    . BIOPSY  03/31/2015   Procedure: BIOPSY;  Surgeon: Daneil Dolin, MD;  Location: [facility];  Service: Endoscopy;;  . CHOLECYSTECTOMY    . COLONOSCOPY WITH PROPOFOL N/A 03/31/2015   Procedure: COLONOSCOPY WITH PROPOFOL at cecum 0842; withdrawal time=28mnutes;  Surgeon: RDaneil Dolin MD;  Location: [facility];  Service: Endoscopy;  Laterality: N/A;  . COLONOSCOPY WITH PROPOFOL N/A 05/21/2016   Procedure: COLONOSCOPY WITH PROPOFOL;  Surgeon: RDaneil Dolin MD;  Location[facility];  Service: Endoscopy;  Laterality: N/A;  200 - moved to 12:45 - office calling pt with 11:15 arrival time  . COLONOSCOPY WITH PROPOFOL N/A 07/26/2016   Procedure: COLONOSCOPY WITH PROPOFOL;  Surgeon: RDaneil Dolin MD;  [facility];  Service: Endoscopy;  Laterality: N/A;  115  . COLONOSCOPY WITH PROPOFOL N/A 07/28/2018   Procedure: COLONOSCOPY WITH PROPOFOL;  Surgeon: RDaneil Dolin MD;  [facility]: [facility];  Service: Endoscopy;  Laterality: N/A;  1:45pm - unable to reach pt to move up  . FLEXIBLE SIGMOIDOSCOPY N/A 07/05/2015   Procedure: FLEXIBLE SIGMOIDOSCOPY;  Surgeon: ALeighton Ruff MD;  Location: [facility];  Service: Endoscopy;  Laterality: N/A;  with tattoo  . POLYPECTOMY  03/31/2015   Procedure: POLYPECTOMY;  Surgeon: RDaneil Dolin MD;  Location: [facility];  Service: Endoscopy;;  . PORTACATH PLACEMENT N/A 08/09/2015   Procedure: INSERTION PORT-A-CATH  LEFT SUBCLAVIAN;  Surgeon: ALeighton Ruff MD;  Location[facility]  Service: General;  Laterality: N/A;  . XI ROBOTIC ASSISTED LOWER ANTERIOR RESECTION N/A 07/06/2015   Procedure: XI ROBOTIC ASSISTED LOWER ANTERIOR RESECTION, rigid proctoscopy;  Surgeon: ALeighton Ruff MD;  Location: [facility];  Service: General;  Laterality: N/A;     SOCIAL HISTORY:  Social History   Socioeconomic History  . Marital status: Divorced    Spouse name: Not on file  . Number of children: Not on file  . Years of education: Not on file  . Highest education level: Not on file  Occupational History  . Not on file  Social Needs  . Financial resource strain: Not on file  . Food insecurity    Worry: Not on file    Inability: Not on file  . Transportation needs    Medical: Not on file    Non-medical: Not on file  Tobacco Use  . Smoking status: Former Smoker    Packs/day: 0.25    Years: 2.00    Pack years: 0.50  Types: Cigarettes    Quit date: 03/23/2006    Years since quitting: 13.2  . Smokeless tobacco: Never Used  Substance and Sexual Activity  . Alcohol use: No    Alcohol/week: 0.0 standard drinks  . Drug use: No  . Sexual activity: Not Currently  Lifestyle  . Physical activity    Days per week: Not on file    Minutes per session: Not on file  . Stress: Not on file  Relationships  . Social Herbalist on phone: Not on file    Gets together: Not on file    Attends religious service: Not on file    Active member of club or organization: Not on file    Attends meetings of clubs or organizations: Not on file    Relationship status: Not on file  . Intimate partner violence    Fear of current or ex partner: Not on file    Emotionally abused: Not on file    Physically abused: Not on file    Forced sexual activity: Not on file  Other Topics Concern  . Not on file  Social History Narrative  . Not on file    FAMILY HISTORY:  Family History  Problem Relation Age of Onset  .  Hypertension Other        "Not sure who, I don't know much about my family"  . Diabetes Other        "Not sure who, I don't know much about my family"  . Colon cancer Neg Hx        "Not that I know of"  . Colon polyps Neg Hx        "not that I know of"    CURRENT MEDICATIONS:  Outpatient Encounter Medications as of 06/01/2019  Medication Sig  . amLODipine (NORVASC) 10 MG tablet Take 10 mg by mouth daily.   Marland Kitchen gabapentin (NEURONTIN) 800 MG tablet Take 1 tablet (800 mg total) by mouth 3 (three) times daily. (Patient taking differently: Take 800 mg by mouth 2 (two) times daily. )  . HYDROcodone-acetaminophen (NORCO) 7.5-325 MG tablet TAKE 1 TABLET BY MOUTH SIX TIMES DAILY  . lisinopril (PRINIVIL,ZESTRIL) 40 MG tablet Take 1 tablet (40 mg total) by mouth every morning.  . NON FORMULARY PT HAS A C-PAP MACHINE  . nortriptyline (PAMELOR) 75 MG capsule Take 1 capsule (75 mg total) by mouth at bedtime.  Marland Kitchen tiZANidine (ZANAFLEX) 2 MG tablet Take 1 tablet (2 mg total) by mouth every 8 (eight) hours as needed for muscle spasms. (Patient taking differently: Take 2 mg by mouth 2 (two) times daily. )  . ACCU-CHEK FASTCLIX LANCETS MISC   . ACCU-CHEK SMARTVIEW test strip   . Alcohol Swabs (B-D SINGLE USE SWABS REGULAR) PADS   . [DISCONTINUED] prochlorperazine (COMPAZINE) 10 MG tablet Take 1 tablet (10 mg total) by mouth every 6 (six) hours as needed (Nausea or vomiting). (Patient not taking: Reported on 10/06/2015)  . [DISCONTINUED] traMADol (ULTRAM) 50 MG tablet Take 50 mg by mouth 3 (three) times daily.   No facility-administered encounter medications on file as of 06/01/2019.     ALLERGIES:  No Known Allergies   PHYSICAL EXAM:  ECOG Performance status: 2  Vitals:   06/01/19 1133  BP: 122/82  Pulse: 99  Resp: 16  Temp: (!) 97.5 F (36.4 C)  SpO2: 100%   Filed Weights   06/01/19 1133  Weight: 205 lb 8 oz (93.2 kg)    Physical Exam  Constitutional:      Appearance: Normal appearance. He  is obese.  HENT:     Head: Normocephalic.     Nose: Nose normal.     Mouth/Throat:     Mouth: Mucous membranes are moist.     Pharynx: Oropharynx is clear.  Eyes:     Extraocular Movements: Extraocular movements intact.     Conjunctiva/sclera: Conjunctivae normal.  Neck:     Musculoskeletal: Normal range of motion.  Cardiovascular:     Rate and Rhythm: Normal rate and regular rhythm.     Pulses: Normal pulses.     Heart sounds: Normal heart sounds.  Pulmonary:     Effort: Pulmonary effort is normal.     Breath sounds: Normal breath sounds.  Abdominal:     General: Bowel sounds are normal.     Palpations: Abdomen is soft.  Musculoskeletal: Normal range of motion.  Skin:    General: Skin is warm and dry.  Neurological:     General: No focal deficit present.     Mental Status: He is alert and oriented to person, place, and time.  Psychiatric:        Mood and Affect: Mood normal.        Behavior: Behavior normal.        Thought Content: Thought content normal.        Judgment: Judgment normal.      LABORATORY DATA:  I have reviewed the labs as listed.  CBC    Component Value Date/Time   WBC 4.3 06/01/2019 1039   RBC 4.36 06/01/2019 1039   HGB 12.6 (L) 06/01/2019 1039   HCT 39.9 06/01/2019 1039   PLT 181 06/01/2019 1039   MCV 91.5 06/01/2019 1039   MCH 28.9 06/01/2019 1039   MCHC 31.6 06/01/2019 1039   RDW 12.3 06/01/2019 1039   LYMPHSABS 0.9 06/01/2019 1039   MONOABS 0.4 06/01/2019 1039   EOSABS 0.2 06/01/2019 1039   BASOSABS 0.0 06/01/2019 1039   CMP Latest Ref Rng & Units 06/01/2019 05/05/2019 10/22/2018  Glucose 70 - 99 mg/dL 96 108(H) 102(H)  BUN 6 - 20 mg/dL 21(H) 16 11  Creatinine 0.61 - 1.24 mg/dL 1.27(H) 1.33(H) 1.06  Sodium 135 - 145 mmol/L 139 140 136  Potassium 3.5 - 5.1 mmol/L 3.7 4.0 3.7  Chloride 98 - 111 mmol/L 106 104 106  CO2 22 - 32 mmol/L '23 26 23  '$ Calcium 8.9 - 10.3 mg/dL 9.3 9.4 9.0  Total Protein 6.5 - 8.1 g/dL 7.3 8.0 7.8  Total  Bilirubin 0.3 - 1.2 mg/dL 0.5 0.5 1.0  Alkaline Phos 38 - 126 U/L 63 75 70  AST 15 - 41 U/L '25 30 25  '$ ALT 0 - 44 U/L 37 38 24     Radiology: I have independently reviewed his CT scan images and discussed with the patient.    ASSESSMENT & PLAN:   Rectal cancer ([place]) 1.  Stage III (PT1PN1A) rectal adenocarcinoma: - Status post segmental resection of the rectosigmoid cancer on 07/06/2015, pathology showing 1/14 lymph node positive, margins negative, MMR normal. - 6 cycles of adjuvant FOLFOX from 08/15/2015 through 11/06/2015 followed by 5-FU and XRT from 12/05/2015 through 01/16/2016 followed by 2 more cycles of FOLFOX from 01/30/2016 through 02/13/2016. -Last colonoscopy on 07/28/2018 showing diverticulosis of the sigmoid colon and descending colon. -CT CAP on 11/06/2018 shows no evidence of recurrence or metastatic disease.  Stable hepatic steatosis. -His most recent CEA was elevated at 6.9.  Baseline CEA is  also slightly elevated but in the range of 5. - We reviewed CT CAP dated 05/27/2019 which showed postoperative findings of low anterior resection with reanastomosis with no evidence of recurrence or metastatic disease in the chest, abdomen and pelvis. - We will see him back in 3 months with repeat CEA level.  We will plan to repeat scans in a year unless drastic change in CEA level.  2.  Chronic hip pain: - This is from osteoarthritis.  He is using hydrocodone 7.5 mg as needed.     Orders placed this encounter:  Orders Placed This Encounter  Procedures  . CBC with Differential/Platelet  . Comprehensive metabolic panel  . CEA      Derek Jack, MD [place] [phone]

## 2019-06-01 NOTE — Patient Instructions (Signed)
[place] at [facility] Discharge Instructions  You were seen today by Dr. Delton Coombes. He went over your recent lab and CT scan results. He will see you back in 3 months for labs and follow up.   Thank you for choosing [place] at [facility] to provide your oncology and hematology care.  To afford each patient quality time with our provider, please arrive at least 15 minutes before your scheduled appointment time.   If you have a lab appointment with the [facility] please come in thru the  Main Entrance and check in at the main information desk  You need to re-schedule your appointment should you arrive 10 or more minutes late.  We strive to give you quality time with our providers, and arriving late affects you and other patients whose appointments are after yours.  Also, if you no show three or more times for appointments you may be dismissed from the clinic at the providers discretion.     Again, thank you for choosing [facility].  Our hope is that these requests will decrease the amount of time that you wait before being seen by our physicians.       _____________________________________________________________  Should you have questions after your visit to [facility], please contact our office at [phone] between the hours of 8:00 a.m. and 4:30 p.m.  Voicemails left after 4:00 p.m. will not be returned until the following business day.  For prescription refill requests, have your pharmacy contact our office and allow 72 hours.    Cancer Center Support Programs:   > Cancer Support Group  2nd Tuesday of the month 1pm-2pm, Journey Room

## 2019-06-01 NOTE — Assessment & Plan Note (Signed)
1.  Stage III (PT1PN1A) rectal adenocarcinoma: - Status post segmental resection of the rectosigmoid cancer on 07/06/2015, pathology showing 1/14 lymph node positive, margins negative, MMR normal. - 6 cycles of adjuvant FOLFOX from 08/15/2015 through 11/06/2015 followed by 5-FU and XRT from 12/05/2015 through 01/16/2016 followed by 2 more cycles of FOLFOX from 01/30/2016 through 02/13/2016. -Last colonoscopy on 07/28/2018 showing diverticulosis of the sigmoid colon and descending colon. -CT CAP on 11/06/2018 shows no evidence of recurrence or metastatic disease.  Stable hepatic steatosis. -His most recent CEA was elevated at 6.9.  Baseline CEA is also slightly elevated but in the range of 5. - We reviewed CT CAP dated 05/27/2019 which showed postoperative findings of low anterior resection with reanastomosis with no evidence of recurrence or metastatic disease in the chest, abdomen and pelvis. - We will see him back in 3 months with repeat CEA level.  We will plan to repeat scans in a year unless drastic change in CEA level.  2.  Chronic hip pain: - This is from osteoarthritis.  He is using hydrocodone 7.5 mg as needed.

## 2019-06-02 LAB — CEA: CEA: 5.5 ng/mL — ABNORMAL HIGH (ref 0.0–4.7)

## 2019-06-09 DIAGNOSIS — M47816 Spondylosis without myelopathy or radiculopathy, lumbar region: Secondary | ICD-10-CM | POA: Diagnosis not present

## 2019-06-09 DIAGNOSIS — M545 Low back pain: Secondary | ICD-10-CM | POA: Diagnosis not present

## 2019-06-09 DIAGNOSIS — G894 Chronic pain syndrome: Secondary | ICD-10-CM | POA: Diagnosis not present

## 2019-06-09 DIAGNOSIS — M79605 Pain in left leg: Secondary | ICD-10-CM | POA: Diagnosis not present

## 2019-06-25 DIAGNOSIS — G63 Polyneuropathy in diseases classified elsewhere: Secondary | ICD-10-CM | POA: Diagnosis not present

## 2019-06-25 DIAGNOSIS — G4733 Obstructive sleep apnea (adult) (pediatric): Secondary | ICD-10-CM | POA: Diagnosis not present

## 2019-06-25 DIAGNOSIS — M79605 Pain in left leg: Secondary | ICD-10-CM | POA: Diagnosis not present

## 2019-06-25 DIAGNOSIS — M5136 Other intervertebral disc degeneration, lumbar region: Secondary | ICD-10-CM | POA: Diagnosis not present

## 2019-06-25 DIAGNOSIS — M5417 Radiculopathy, lumbosacral region: Secondary | ICD-10-CM | POA: Diagnosis not present

## 2019-06-25 DIAGNOSIS — I10 Essential (primary) hypertension: Secondary | ICD-10-CM | POA: Diagnosis not present

## 2019-06-25 DIAGNOSIS — M5416 Radiculopathy, lumbar region: Secondary | ICD-10-CM | POA: Diagnosis not present

## 2019-07-01 DIAGNOSIS — G894 Chronic pain syndrome: Secondary | ICD-10-CM | POA: Diagnosis not present

## 2019-07-01 DIAGNOSIS — M792 Neuralgia and neuritis, unspecified: Secondary | ICD-10-CM | POA: Diagnosis not present

## 2019-07-13 DIAGNOSIS — G894 Chronic pain syndrome: Secondary | ICD-10-CM | POA: Diagnosis not present

## 2019-07-13 DIAGNOSIS — M5417 Radiculopathy, lumbosacral region: Secondary | ICD-10-CM | POA: Diagnosis not present

## 2019-07-13 DIAGNOSIS — M961 Postlaminectomy syndrome, not elsewhere classified: Secondary | ICD-10-CM | POA: Diagnosis not present

## 2019-07-13 DIAGNOSIS — M5136 Other intervertebral disc degeneration, lumbar region: Secondary | ICD-10-CM | POA: Diagnosis not present

## 2019-07-26 DIAGNOSIS — I10 Essential (primary) hypertension: Secondary | ICD-10-CM | POA: Diagnosis not present

## 2019-07-26 DIAGNOSIS — E785 Hyperlipidemia, unspecified: Secondary | ICD-10-CM | POA: Diagnosis not present

## 2019-08-10 DIAGNOSIS — M961 Postlaminectomy syndrome, not elsewhere classified: Secondary | ICD-10-CM | POA: Diagnosis not present

## 2019-08-10 DIAGNOSIS — M5417 Radiculopathy, lumbosacral region: Secondary | ICD-10-CM | POA: Diagnosis not present

## 2019-08-10 DIAGNOSIS — M5136 Other intervertebral disc degeneration, lumbar region: Secondary | ICD-10-CM | POA: Diagnosis not present

## 2019-08-10 DIAGNOSIS — Z79891 Long term (current) use of opiate analgesic: Secondary | ICD-10-CM | POA: Diagnosis not present

## 2019-08-10 DIAGNOSIS — Z79899 Other long term (current) drug therapy: Secondary | ICD-10-CM | POA: Diagnosis not present

## 2019-08-10 DIAGNOSIS — G894 Chronic pain syndrome: Secondary | ICD-10-CM | POA: Diagnosis not present

## 2019-08-19 ENCOUNTER — Encounter: Payer: Self-pay | Admitting: Internal Medicine

## 2019-08-25 DIAGNOSIS — M549 Dorsalgia, unspecified: Secondary | ICD-10-CM | POA: Diagnosis not present

## 2019-08-25 DIAGNOSIS — I10 Essential (primary) hypertension: Secondary | ICD-10-CM | POA: Diagnosis not present

## 2019-08-26 ENCOUNTER — Inpatient Hospital Stay (HOSPITAL_COMMUNITY): Payer: Medicare Other | Attending: Hematology

## 2019-08-26 ENCOUNTER — Encounter (HOSPITAL_COMMUNITY): Payer: Medicare Other

## 2019-08-26 ENCOUNTER — Other Ambulatory Visit: Payer: Self-pay

## 2019-08-26 DIAGNOSIS — Z923 Personal history of irradiation: Secondary | ICD-10-CM | POA: Diagnosis not present

## 2019-08-26 DIAGNOSIS — Z79899 Other long term (current) drug therapy: Secondary | ICD-10-CM | POA: Diagnosis not present

## 2019-08-26 DIAGNOSIS — Z452 Encounter for adjustment and management of vascular access device: Secondary | ICD-10-CM | POA: Insufficient documentation

## 2019-08-26 DIAGNOSIS — M1990 Unspecified osteoarthritis, unspecified site: Secondary | ICD-10-CM | POA: Insufficient documentation

## 2019-08-26 DIAGNOSIS — Z9221 Personal history of antineoplastic chemotherapy: Secondary | ICD-10-CM | POA: Diagnosis not present

## 2019-08-26 DIAGNOSIS — Z85048 Personal history of other malignant neoplasm of rectum, rectosigmoid junction, and anus: Secondary | ICD-10-CM | POA: Insufficient documentation

## 2019-08-26 LAB — COMPREHENSIVE METABOLIC PANEL
ALT: 45 U/L — ABNORMAL HIGH (ref 0–44)
AST: 33 U/L (ref 15–41)
Albumin: 4.1 g/dL (ref 3.5–5.0)
Alkaline Phosphatase: 72 U/L (ref 38–126)
Anion gap: 10 (ref 5–15)
BUN: 11 mg/dL (ref 6–20)
CO2: 26 mmol/L (ref 22–32)
Calcium: 9.3 mg/dL (ref 8.9–10.3)
Chloride: 102 mmol/L (ref 98–111)
Creatinine, Ser: 1.02 mg/dL (ref 0.61–1.24)
GFR calc Af Amer: >60 mL/min (ref >60)
GFR calc non Af Amer: >60 mL/min (ref >60)
Glucose, Bld: 92 mg/dL (ref 70–99)
Potassium: 4 mmol/L (ref 3.5–5.1)
Sodium: 138 mmol/L (ref 135–145)
Total Bilirubin: 0.6 mg/dL (ref 0.3–1.2)
Total Protein: 7.6 g/dL (ref 6.5–8.1)

## 2019-08-26 LAB — CBC WITH DIFFERENTIAL/PLATELET
Abs Immature Granulocytes: 0.08 10*3/uL — ABNORMAL HIGH (ref 0.00–0.07)
Basophils Absolute: 0 10*3/uL (ref 0.0–0.1)
Basophils Relative: 1 %
Eosinophils Absolute: 0.3 10*3/uL (ref 0.0–0.5)
Eosinophils Relative: 6 %
HCT: 39.8 % (ref 39.0–52.0)
Hemoglobin: 12.9 g/dL — ABNORMAL LOW (ref 13.0–17.0)
Immature Granulocytes: 2 %
Lymphocytes Relative: 18 %
Lymphs Abs: 0.9 10*3/uL (ref 0.7–4.0)
MCH: 29.9 pg (ref 26.0–34.0)
MCHC: 32.4 g/dL (ref 30.0–36.0)
MCV: 92.1 fL (ref 80.0–100.0)
Monocytes Absolute: 0.5 10*3/uL (ref 0.1–1.0)
Monocytes Relative: 9 %
Neutro Abs: 3.4 10*3/uL (ref 1.7–7.7)
Neutrophils Relative %: 64 %
Platelets: 183 10*3/uL (ref 150–400)
RBC: 4.32 MIL/uL (ref 4.22–5.81)
RDW: 12.7 % (ref 11.5–15.5)
WBC: 5.3 10*3/uL (ref 4.0–10.5)
nRBC: 0 % (ref 0.0–0.2)

## 2019-08-27 LAB — CEA: CEA: 7.2 ng/mL — ABNORMAL HIGH (ref 0.0–4.7)

## 2019-09-02 ENCOUNTER — Other Ambulatory Visit: Payer: Self-pay

## 2019-09-02 ENCOUNTER — Inpatient Hospital Stay (HOSPITAL_COMMUNITY): Payer: Medicare Other

## 2019-09-02 ENCOUNTER — Encounter (HOSPITAL_COMMUNITY): Payer: Self-pay | Admitting: Hematology

## 2019-09-02 ENCOUNTER — Inpatient Hospital Stay (HOSPITAL_BASED_OUTPATIENT_CLINIC_OR_DEPARTMENT_OTHER): Payer: Medicare Other | Admitting: Hematology

## 2019-09-02 VITALS — BP 148/91 | HR 106 | Temp 97.3°F | Resp 20 | Wt 208.7 lb

## 2019-09-02 DIAGNOSIS — Z923 Personal history of irradiation: Secondary | ICD-10-CM | POA: Diagnosis not present

## 2019-09-02 DIAGNOSIS — Z85048 Personal history of other malignant neoplasm of rectum, rectosigmoid junction, and anus: Secondary | ICD-10-CM

## 2019-09-02 DIAGNOSIS — C20 Malignant neoplasm of rectum: Secondary | ICD-10-CM | POA: Diagnosis not present

## 2019-09-02 DIAGNOSIS — Z79899 Other long term (current) drug therapy: Secondary | ICD-10-CM | POA: Diagnosis not present

## 2019-09-02 DIAGNOSIS — M1990 Unspecified osteoarthritis, unspecified site: Secondary | ICD-10-CM | POA: Diagnosis not present

## 2019-09-02 DIAGNOSIS — Z9221 Personal history of antineoplastic chemotherapy: Secondary | ICD-10-CM | POA: Diagnosis not present

## 2019-09-02 DIAGNOSIS — Z452 Encounter for adjustment and management of vascular access device: Secondary | ICD-10-CM | POA: Diagnosis not present

## 2019-09-02 MED ORDER — SODIUM CHLORIDE 0.9% FLUSH
10.0000 mL | Freq: Once | INTRAVENOUS | Status: AC
  Administered 2019-09-02: 10 mL via INTRAVENOUS

## 2019-09-02 MED ORDER — HEPARIN SOD (PORK) LOCK FLUSH 100 UNIT/ML IV SOLN
500.0000 [IU] | Freq: Once | INTRAVENOUS | Status: AC
  Administered 2019-09-02: 15:00:00 500 [IU] via INTRAVENOUS

## 2019-09-02 NOTE — Assessment & Plan Note (Addendum)
1.  Stage III (PT1PN1A) rectal adenocarcinoma: - Status post segmental resection of the rectosigmoid cancer on 07/06/2015, pathology showing 1/14 lymph node positive, margins negative, MMR normal. - 6 cycles of adjuvant FOLFOX from 08/15/2015 through 11/06/2015 followed by 5-FU and XRT from 12/05/2015 through 01/16/2016 followed by 2 more cycles of FOLFOX from 01/30/2016 through 02/13/2016. -Last colonoscopy on 07/28/2018 showing diverticulosis of the sigmoid colon and descending colon. -CT CAP on 11/06/2018 shows no evidence of recurrence or metastatic disease.  Stable hepatic steatosis. -His most recent CEA was elevated at 6.9.  Baseline CEA is also slightly elevated but in the range of 5. - CT CAP on 05/27/2019 showed postoperative findings of the low anterior resection with reanastomosis with no evidence of recurrence or metastatic disease in the chest, abdomen and pelvis. - We reviewed results which showed CEA is 7.2.  He denies any recent respiratory infections or abdominal pain. - He is a non-smoker.  Differential diagnosis includes liver disease. - I plan to repeat CEA level in 3 months.  If it continues to go high, will consider PET CT scan.  2.  Chronic hip pain: - This is from osteoarthritis.  He uses oxycodone as needed.

## 2019-09-02 NOTE — Progress Notes (Signed)
Patients port flushed without difficulty.  Good blood return noted with no bruising or swelling noted at site.  Band aid applied.  VSS with discharge and left ambulatory with no s/s of distress noted.  

## 2019-09-02 NOTE — Progress Notes (Signed)
[name] [place], [place] [address]   CLINIC:  Medical Oncology/Hematology  PCP:  Douglas Fire, MD [place] [address] [phone]   REASON FOR VISIT:  Follow-up for  Rectal Adenocarcinoma  CURRENT THERAPY: Clinical Surveillance   BRIEF ONCOLOGIC HISTORY:  Oncology History  Rectal [name] ([name])  03/31/2015 Initial Biopsy   Rectum, biopsy, rectal mass - INVASIVE ADENOCARCINOMA   06/02/2015 Tumor Marker   Results for [name], [name] (MRN [id number]) as of 08/28/2015 09:09  06/02/2015 16:00 CEA: 4.3    06/08/2015 Imaging   CT abd/pelvis-Rectal primary, without evidence of metastatic disease or acute complication.   06/24/2015 Imaging   CT chest- No specific features identified to suggest metastatic disease to the chest.   07/06/2015 Definitive Surgery   Robotic-assisted lower anterior resrction, rigid proctoscopy [name])- INVASIVE ADENOCARCINOMA, 2 cm, TUMOR INVADES INTO SUBMUCOSA. LVI/PNI (+).  1/14 LN (+). Margins neg.    07/06/2015 Miscellaneous   IHC Tumor Expression Results normal.  Negative for MLH1, MSH2, MSH6, & PMS2.    07/06/2015 Pathologic Stage   pT1, pN1a, pMx   08/15/2015 - 11/16/2015 Chemotherapy   FOLFOX x 6 cycles (adjuvant chemo "phase 1")   09/26/2015 Treatment Plan Change   Treatment held today.  5FU bolus D/C'd.  Neulasta OnPro added to each subsequent cycles of therapy.   10/04/2015 Treatment Plan Change   Neulasta onpro added to all subsequent cycles of treatment.   10/18/2015 Treatment Plan Change   Tx held for thrombocytopenia.  5FU CI is reduced by 20% for subsequent treatments.   12/05/2015 - 01/16/2016 Chemotherapy   5FU continuous infusion weekly x 6 cycles; concurrent radiation. (adjuvant chemo "phase 2")   12/05/2015 - 01/18/2016 Radiation Therapy   Pelvic radiation completed in [facility]).  Pelvis 45 Gy in 25 fractions.  Then cone down boost treatment for additional 5.4 Gy in 8 fractions.   Total dose: 50.4 Gy in 33 treatments   01/30/2016 - 02/13/2016 Chemotherapy   FOLFOX x 2 cycles to complete 6 months of adjuvant chemotherapy.  (adjuvant chemo "phase 3")   05/21/2016 Procedure   Colonoscopy by Dr. Gala [name]- The procedure was aborted due to inadequate bowel prep. Patient readily admits to eating at least applesauce yesterday which was in conflict with our written and verbal preparation instructions. No specimens collected.   06/19/2016 Imaging   CT abd/pelvis- Postsurgical changes of the rectum. New presacral soft tissue thickening likely post treatment related. Recommend attention on followup. Large amount of stool in the colon compatible with constipation. No evidence for distant metastatic dz.    Imaging   CT abd/pelvis: IMPRESSION: Stable exam. No evidence of recurrent for metastatic carcinoma within the abdomen or pelvis.  Mild decrease in large colonic stool burden since prior study. No acute findings      [name] STAGING: [name] Staging Rectal [name] [name]) Staging form: Colon and Rectum, AJCC 7th Edition - Pathologic stage from 09/12/2015: Stage IIIA (T1, N1a, cM0) - Signed by [name] on 09/25/2015 - Clinical stage from 09/26/2015: Stage IIIA (T1, N1a, M0) - Signed by [name] on 09/25/2015    INTERVAL HISTORY:  [name] 58 y.o. male seen for follow-up of rectal [name].  Denies any recent pneumonias or other lung infections.  Denies any change in bowel habits.  Denies any bleeding per rectum or melena.  No fevers, night sweats or weight loss reported.  Appetite is 100%.  Energy levels are  50%.  Shortness of breath with activity stable.  No new pains reported.  REVIEW OF SYSTEMS:  Review of Systems  Respiratory: Positive for shortness of breath.   All other systems reviewed and are negative.    PAST MEDICAL/SURGICAL HISTORY:  Past Medical History:  Diagnosis Date  . Arthritis   . [name] ([place])    rectal  . Depression   .  Diabetes mellitus    "Borderline"  . GERD (gastroesophageal reflux disease)    No weakness  . High cholesterol   . Hypertension   . Neuropathy    "back missed up"  . Rectal [name] ([place])   . Sleep apnea    Past Surgical History:  Procedure Laterality Date  . BACK SURGERY    . BIOPSY  03/31/2015   Procedure: BIOPSY;  Surgeon: Daneil Dolin, MD;  Location: [facility];  Service: Endoscopy;;  . CHOLECYSTECTOMY    . COLONOSCOPY WITH PROPOFOL N/A 03/31/2015   Procedure: COLONOSCOPY WITH PROPOFOL at cecum 0842; withdrawal time=11mnutes;  Surgeon: RDaneil Dolin MD;  Location: [facility];  Service: Endoscopy;  Laterality: N/A;  . COLONOSCOPY WITH PROPOFOL N/A 05/21/2016   Procedure: COLONOSCOPY WITH PROPOFOL;  Surgeon: RDaneil Dolin MD;  Location: [facility];  Service: Endoscopy;  Laterality: N/A;  200 - moved to 12:45 - office calling pt with 11:15 arrival time  . COLONOSCOPY WITH PROPOFOL N/A 07/26/2016   Procedure: COLONOSCOPY WITH PROPOFOL;  Surgeon: RDaneil Dolin MD;  [facility]: [facility];  Service: Endoscopy;  Laterality: N/A;  115  . COLONOSCOPY WITH PROPOFOL N/A 07/28/2018   Procedure: COLONOSCOPY WITH PROPOFOL;  Surgeon: RDaneil Dolin MD;  [facility]: [facility];  Service: Endoscopy;  Laterality: N/A;  1:45pm - unable to reach pt to move up  . FLEXIBLE SIGMOIDOSCOPY N/A 07/05/2015   Procedure: FLEXIBLE SIGMOIDOSCOPY;  Surgeon: ALeighton Ruff MD;  Location: [facility];  Service: Endoscopy;  Laterality: N/A;  with tattoo  . POLYPECTOMY  03/31/2015   Procedure: POLYPECTOMY;  Surgeon: RDaneil Dolin MD;  Location: [facility];  Service: Endoscopy;;  . PORTACATH PLACEMENT N/A 08/09/2015   Procedure: INSERTION PORT-A-CATH LEFT SUBCLAVIAN;  Surgeon: ALeighton Ruff MD;  [place] [facility] [facility]  Service: General;  Laterality: N/A;  . XI ROBOTIC ASSISTED LOWER ANTERIOR RESECTION N/A 07/06/2015   Procedure: XI ROBOTIC ASSISTED LOWER ANTERIOR RESECTION, rigid proctoscopy;  Surgeon: ALeighton Ruff MD;   Location: [facility];  Service: General;  Laterality: N/A;     SOCIAL HISTORY:  Social History   Socioeconomic History  . Marital status: Divorced    Spouse name: Not on file  . Number of children: Not on file  . Years of education: Not on file  . Highest education level: Not on file  Occupational History  . Not on file  Social Needs  . Financial resource strain: Not on file  . Food insecurity    Worry: Not on file    Inability: Not on file  . Transportation needs    Medical: Not on file    Non-medical: Not on file  Tobacco Use  . Smoking status: Former Smoker    Packs/day: 0.25    Years: 2.00    Pack years: 0.50    Types: Cigarettes    Quit date: 03/23/2006    Years since quitting: 13.4  . Smokeless tobacco: Never Used  Substance and Sexual Activity  . Alcohol use: No    Alcohol/week: 0.0 standard drinks  . Drug use: No  .  Sexual activity: Not Currently  Lifestyle  . Physical activity    Days per week: Not on file    Minutes per session: Not on file  . Stress: Not on file  Relationships  . Social Herbalist on phone: Not on file    Gets together: Not on file    Attends religious service: Not on file    Active member of club or organization: Not on file    Attends meetings of clubs or organizations: Not on file    Relationship status: Not on file  . Intimate partner violence    Fear of current or ex partner: Not on file    Emotionally abused: Not on file    Physically abused: Not on file    Forced sexual activity: Not on file  Other Topics Concern  . Not on file  Social History Narrative  . Not on file    FAMILY HISTORY:  Family History  Problem Relation Age of Onset  . Hypertension Other        "Not sure who, I don't know much about my family"  . Diabetes Other        "Not sure who, I don't know much about my family"  . Colon [name] Neg Hx        "Not that I know of"  . Colon polyps Neg Hx        "not that I know of"    CURRENT  MEDICATIONS:  Outpatient Encounter Medications as of 09/02/2019  Medication Sig  . ACCU-CHEK FASTCLIX LANCETS MISC   . ACCU-CHEK SMARTVIEW test strip   . Alcohol Swabs (B-D SINGLE USE SWABS REGULAR) PADS   . amLODipine (NORVASC) 10 MG tablet Take 10 mg by mouth daily.   Marland Kitchen gabapentin (NEURONTIN) 800 MG tablet Take 1 tablet (800 mg total) by mouth 3 (three) times daily. (Patient taking differently: Take 800 mg by mouth 2 (two) times daily. )  . lisinopril (PRINIVIL,ZESTRIL) 40 MG tablet Take 1 tablet (40 mg total) by mouth every morning.  . NON FORMULARY PT HAS A C-PAP MACHINE  . nortriptyline (PAMELOR) 75 MG capsule Take 1 capsule (75 mg total) by mouth at bedtime.  Marland Kitchen oxyCODONE-acetaminophen (PERCOCET) 10-325 MG tablet Take 1 tablet by mouth 5 (five) times daily.  Marland Kitchen tiZANidine (ZANAFLEX) 2 MG tablet Take 1 tablet (2 mg total) by mouth every 8 (eight) hours as needed for muscle spasms. (Patient taking differently: Take 2 mg by mouth 2 (two) times daily. )  . [DISCONTINUED] HYDROcodone-acetaminophen ([place]) 7.5-325 MG tablet TAKE 1 TABLET BY MOUTH SIX TIMES DAILY  . [DISCONTINUED] prochlorperazine (COMPAZINE) 10 MG tablet Take 1 tablet (10 mg total) by mouth every 6 (six) hours as needed (Nausea or vomiting). (Patient not taking: Reported on 10/06/2015)   No facility-administered encounter medications on file as of 09/02/2019.     ALLERGIES:  No Known Allergies   PHYSICAL EXAM:  ECOG Performance status: 2  Vitals:   09/02/19 1420  BP: (!) 148/91  Pulse: (!) 106  Resp: 20  Temp: (!) 97.3 F (36.3 C)  SpO2: 100%   Filed Weights   09/02/19 1420  Weight: 208 lb 11.2 oz (94.7 kg)    Physical Exam Constitutional:      Appearance: Normal appearance. He is obese.  HENT:     Head: Normocephalic.     Nose: Nose normal.     Mouth/Throat:     Mouth: Mucous membranes are moist.  Pharynx: Oropharynx is clear.  Eyes:     Extraocular Movements: Extraocular movements intact.      Conjunctiva/sclera: Conjunctivae normal.  Neck:     Musculoskeletal: Normal range of motion.  Cardiovascular:     Rate and Rhythm: Normal rate and regular rhythm.     Pulses: Normal pulses.     Heart sounds: Normal heart sounds.  Pulmonary:     Effort: Pulmonary effort is normal.     Breath sounds: Normal breath sounds.  Abdominal:     General: Bowel sounds are normal.     Palpations: Abdomen is soft.  Musculoskeletal: Normal range of motion.  Skin:    General: Skin is warm and dry.  Neurological:     General: No focal deficit present.     Mental Status: He is alert and oriented to person, place, and time.  Psychiatric:        Mood and Affect: Mood normal.        Behavior: Behavior normal.        Thought Content: Thought content normal.        Judgment: Judgment normal.      LABORATORY DATA:  I have reviewed the labs as listed.  CBC    Component Value Date/Time   WBC 5.3 08/26/2019 1109   RBC 4.32 08/26/2019 1109   HGB 12.9 (L) 08/26/2019 1109   HCT 39.8 08/26/2019 1109   PLT 183 08/26/2019 1109   MCV 92.1 08/26/2019 1109   MCH 29.9 08/26/2019 1109   MCHC 32.4 08/26/2019 1109   RDW 12.7 08/26/2019 1109   LYMPHSABS 0.9 08/26/2019 1109   MONOABS 0.5 08/26/2019 1109   EOSABS 0.3 08/26/2019 1109   BASOSABS 0.0 08/26/2019 1109   CMP Latest Ref Rng & Units 08/26/2019 06/01/2019 05/05/2019  Glucose 70 - 99 mg/dL 92 96 108(H)  BUN 6 - 20 mg/dL 11 21(H) 16  Creatinine 0.61 - 1.24 mg/dL 1.02 1.27(H) 1.33(H)  Sodium 135 - 145 mmol/L 138 139 140  Potassium 3.5 - 5.1 mmol/L 4.0 3.7 4.0  Chloride 98 - 111 mmol/L 102 106 104  CO2 22 - 32 mmol/L '26 23 26  '$ Calcium 8.9 - 10.3 mg/dL 9.3 9.3 9.4  Total Protein 6.5 - 8.1 g/dL 7.6 7.3 8.0  Total Bilirubin 0.3 - 1.2 mg/dL 0.6 0.5 0.5  Alkaline Phos 38 - 126 U/L 72 63 75  AST 15 - 41 U/L 33 25 30  ALT 0 - 44 U/L 45(H) 37 38     Radiology: I have independently reviewed his CT scan images and discussed with the patient.     ASSESSMENT & PLAN:   Rectal [name] ([place]) 1.  Stage III (PT1PN1A) rectal adenocarcinoma: - Status post segmental resection of the rectosigmoid [name] on 07/06/2015, pathology showing 1/14 lymph node positive, margins negative, MMR normal. - 6 cycles of adjuvant FOLFOX from 08/15/2015 through 11/06/2015 followed by 5-FU and XRT from 12/05/2015 through 01/16/2016 followed by 2 more cycles of FOLFOX from 01/30/2016 through 02/13/2016. -Last colonoscopy on 07/28/2018 showing diverticulosis of the sigmoid colon and descending colon. -CT CAP on 11/06/2018 shows no evidence of recurrence or metastatic disease.  Stable hepatic steatosis. -His most recent CEA was elevated at 6.9.  Baseline CEA is also slightly elevated but in the range of 5. - CT CAP on 05/27/2019 showed postoperative findings of the low anterior resection with reanastomosis with no evidence of recurrence or metastatic disease in the chest, abdomen and pelvis. - We reviewed results which showed CEA  is 7.2.  He denies any recent respiratory infections or abdominal pain. - He is a non-smoker.  Differential diagnosis includes liver disease. - I plan to repeat CEA level in 3 months.  If it continues to go high, will consider PET CT scan.  2.  Chronic hip pain: - This is from osteoarthritis.  He uses oxycodone as needed.     Orders placed this encounter:  Orders Placed This Encounter  Procedures  . CBC with Differential/Platelet  . Comprehensive metabolic panel  . CEA      Derek Jack, MD [place] [phone]

## 2019-09-02 NOTE — Patient Instructions (Signed)
[facility] Discharge Instructions  You were seen today by Dr. Katragadda. He went over your recent lab results. He will see you back in 3 months for labs and follow up.   Thank you for choosing [facility] at [facility] to provide your oncology and hematology care.  To afford each patient quality time with our provider, please arrive at least 15 minutes before your scheduled appointment time.   If you have a lab appointment with the Cancer Center please come in thru the  Main Entrance and check in at the main information desk  You need to re-schedule your appointment should you arrive 10 or more minutes late.  We strive to give you quality time with our providers, and arriving late affects you and other patients whose appointments are after yours.  Also, if you no show three or more times for appointments you may be dismissed from the clinic at the providers discretion.     Again, thank you for choosing [facility].  Our hope is that these requests will decrease the amount of time that you wait before being seen by our physicians.       _____________________________________________________________  Should you have questions after your visit to  Cancer Center, please contact our office at [phone] between the hours of 8:00 a.m. and 4:30 p.m.  Voicemails left after 4:00 p.m. will not be returned until the following business day.  For prescription refill requests, have your pharmacy contact our office and allow 72 hours.    Cancer Center Support Programs:   > Cancer Support Group  2nd Tuesday of the month 1pm-2pm, Journey Room    

## 2019-09-07 DIAGNOSIS — M5136 Other intervertebral disc degeneration, lumbar region: Secondary | ICD-10-CM | POA: Diagnosis not present

## 2019-09-07 DIAGNOSIS — Z79899 Other long term (current) drug therapy: Secondary | ICD-10-CM | POA: Diagnosis not present

## 2019-09-07 DIAGNOSIS — M961 Postlaminectomy syndrome, not elsewhere classified: Secondary | ICD-10-CM | POA: Diagnosis not present

## 2019-09-07 DIAGNOSIS — G894 Chronic pain syndrome: Secondary | ICD-10-CM | POA: Diagnosis not present

## 2019-09-07 DIAGNOSIS — M79605 Pain in left leg: Secondary | ICD-10-CM | POA: Diagnosis not present

## 2019-09-07 DIAGNOSIS — Z79891 Long term (current) use of opiate analgesic: Secondary | ICD-10-CM | POA: Diagnosis not present

## 2019-09-25 DIAGNOSIS — I10 Essential (primary) hypertension: Secondary | ICD-10-CM | POA: Diagnosis not present

## 2019-09-25 DIAGNOSIS — M549 Dorsalgia, unspecified: Secondary | ICD-10-CM | POA: Diagnosis not present

## 2019-10-06 DIAGNOSIS — G894 Chronic pain syndrome: Secondary | ICD-10-CM | POA: Diagnosis not present

## 2019-10-06 DIAGNOSIS — M545 Low back pain: Secondary | ICD-10-CM | POA: Diagnosis not present

## 2019-10-06 DIAGNOSIS — M79605 Pain in left leg: Secondary | ICD-10-CM | POA: Diagnosis not present

## 2019-10-06 DIAGNOSIS — M47816 Spondylosis without myelopathy or radiculopathy, lumbar region: Secondary | ICD-10-CM | POA: Diagnosis not present

## 2019-10-25 DIAGNOSIS — M549 Dorsalgia, unspecified: Secondary | ICD-10-CM | POA: Diagnosis not present

## 2019-10-25 DIAGNOSIS — I10 Essential (primary) hypertension: Secondary | ICD-10-CM | POA: Diagnosis not present

## 2019-10-27 ENCOUNTER — Other Ambulatory Visit: Payer: Self-pay

## 2019-10-27 NOTE — Patient Outreach (Signed)
[facility]) Care Management  10/27/2019  [name] 1961-07-26 [id number]   Medication Adherence call to [name] HIPPA Compliant Voice message left with a call back number. [name] is showing past due on Lisinopril 40 mg under [place].   [place] Management Direct Dial [phone]  Fax [phone] [email or web address]

## 2019-11-03 DIAGNOSIS — M79605 Pain in left leg: Secondary | ICD-10-CM | POA: Diagnosis not present

## 2019-11-03 DIAGNOSIS — Z79891 Long term (current) use of opiate analgesic: Secondary | ICD-10-CM | POA: Diagnosis not present

## 2019-11-03 DIAGNOSIS — G894 Chronic pain syndrome: Secondary | ICD-10-CM | POA: Diagnosis not present

## 2019-11-03 DIAGNOSIS — Z79899 Other long term (current) drug therapy: Secondary | ICD-10-CM | POA: Diagnosis not present

## 2019-11-03 DIAGNOSIS — M545 Low back pain: Secondary | ICD-10-CM | POA: Diagnosis not present

## 2019-11-03 DIAGNOSIS — M47816 Spondylosis without myelopathy or radiculopathy, lumbar region: Secondary | ICD-10-CM | POA: Diagnosis not present

## 2019-11-10 ENCOUNTER — Ambulatory Visit (HOSPITAL_COMMUNITY): Payer: Medicare Other

## 2019-11-10 ENCOUNTER — Other Ambulatory Visit (HOSPITAL_COMMUNITY): Payer: Medicare Other

## 2019-11-17 ENCOUNTER — Ambulatory Visit (HOSPITAL_COMMUNITY): Payer: Medicare Other | Admitting: Hematology

## 2019-11-17 ENCOUNTER — Encounter (HOSPITAL_COMMUNITY): Payer: Medicare Other

## 2019-12-01 DIAGNOSIS — M79605 Pain in left leg: Secondary | ICD-10-CM | POA: Diagnosis not present

## 2019-12-01 DIAGNOSIS — M47816 Spondylosis without myelopathy or radiculopathy, lumbar region: Secondary | ICD-10-CM | POA: Diagnosis not present

## 2019-12-01 DIAGNOSIS — G894 Chronic pain syndrome: Secondary | ICD-10-CM | POA: Diagnosis not present

## 2019-12-01 DIAGNOSIS — M545 Low back pain: Secondary | ICD-10-CM | POA: Diagnosis not present

## 2019-12-03 ENCOUNTER — Other Ambulatory Visit (HOSPITAL_COMMUNITY): Payer: Medicare Other

## 2019-12-10 ENCOUNTER — Ambulatory Visit (HOSPITAL_COMMUNITY): Payer: Medicare Other | Admitting: Hematology

## 2019-12-15 DIAGNOSIS — I10 Essential (primary) hypertension: Secondary | ICD-10-CM | POA: Diagnosis not present

## 2019-12-15 DIAGNOSIS — C20 Malignant neoplasm of rectum: Secondary | ICD-10-CM | POA: Diagnosis not present

## 2019-12-15 DIAGNOSIS — Z0001 Encounter for general adult medical examination with abnormal findings: Secondary | ICD-10-CM | POA: Diagnosis not present

## 2019-12-15 DIAGNOSIS — G63 Polyneuropathy in diseases classified elsewhere: Secondary | ICD-10-CM | POA: Diagnosis not present

## 2019-12-15 DIAGNOSIS — Z1389 Encounter for screening for other disorder: Secondary | ICD-10-CM | POA: Diagnosis not present

## 2019-12-16 DIAGNOSIS — R7303 Prediabetes: Secondary | ICD-10-CM | POA: Diagnosis not present

## 2019-12-16 DIAGNOSIS — I10 Essential (primary) hypertension: Secondary | ICD-10-CM | POA: Diagnosis not present

## 2019-12-16 DIAGNOSIS — E785 Hyperlipidemia, unspecified: Secondary | ICD-10-CM | POA: Diagnosis not present

## 2019-12-17 ENCOUNTER — Other Ambulatory Visit (HOSPITAL_COMMUNITY): Payer: Medicare Other

## 2019-12-17 ENCOUNTER — Other Ambulatory Visit: Payer: Self-pay

## 2019-12-17 ENCOUNTER — Inpatient Hospital Stay (HOSPITAL_COMMUNITY): Payer: Medicare Other | Attending: Hematology

## 2019-12-17 DIAGNOSIS — Z923 Personal history of irradiation: Secondary | ICD-10-CM | POA: Diagnosis not present

## 2019-12-17 DIAGNOSIS — Z85048 Personal history of other malignant neoplasm of rectum, rectosigmoid junction, and anus: Secondary | ICD-10-CM | POA: Diagnosis not present

## 2019-12-17 DIAGNOSIS — Z9221 Personal history of antineoplastic chemotherapy: Secondary | ICD-10-CM | POA: Diagnosis not present

## 2019-12-17 LAB — COMPREHENSIVE METABOLIC PANEL
ALT: 60 U/L — ABNORMAL HIGH (ref 0–44)
AST: 38 U/L (ref 15–41)
Albumin: 4.1 g/dL (ref 3.5–5.0)
Alkaline Phosphatase: 71 U/L (ref 38–126)
Anion gap: 9 (ref 5–15)
BUN: 19 mg/dL (ref 6–20)
CO2: 24 mmol/L (ref 22–32)
Calcium: 9.1 mg/dL (ref 8.9–10.3)
Chloride: 105 mmol/L (ref 98–111)
Creatinine, Ser: 1.29 mg/dL — ABNORMAL HIGH (ref 0.61–1.24)
GFR calc Af Amer: >60 mL/min (ref >60)
GFR calc non Af Amer: >60 mL/min (ref >60)
Glucose, Bld: 105 mg/dL — ABNORMAL HIGH (ref 70–99)
Potassium: 4.1 mmol/L (ref 3.5–5.1)
Sodium: 138 mmol/L (ref 135–145)
Total Bilirubin: 0.8 mg/dL (ref 0.3–1.2)
Total Protein: 7.6 g/dL (ref 6.5–8.1)

## 2019-12-17 LAB — CBC WITH DIFFERENTIAL/PLATELET
Abs Immature Granulocytes: 0.05 10*3/uL (ref 0.00–0.07)
Basophils Absolute: 0 10*3/uL (ref 0.0–0.1)
Basophils Relative: 1 %
Eosinophils Absolute: 0.4 10*3/uL (ref 0.0–0.5)
Eosinophils Relative: 8 %
HCT: 38.9 % — ABNORMAL LOW (ref 39.0–52.0)
Hemoglobin: 12.2 g/dL — ABNORMAL LOW (ref 13.0–17.0)
Immature Granulocytes: 1 %
Lymphocytes Relative: 23 %
Lymphs Abs: 1.2 10*3/uL (ref 0.7–4.0)
MCH: 29.2 pg (ref 26.0–34.0)
MCHC: 31.4 g/dL (ref 30.0–36.0)
MCV: 93.1 fL (ref 80.0–100.0)
Monocytes Absolute: 0.5 10*3/uL (ref 0.1–1.0)
Monocytes Relative: 9 %
Neutro Abs: 3.2 10*3/uL (ref 1.7–7.7)
Neutrophils Relative %: 58 %
Platelets: 175 10*3/uL (ref 150–400)
RBC: 4.18 MIL/uL — ABNORMAL LOW (ref 4.22–5.81)
RDW: 12.8 % (ref 11.5–15.5)
WBC: 5.3 10*3/uL (ref 4.0–10.5)
nRBC: 0 % (ref 0.0–0.2)

## 2019-12-17 MED ORDER — SODIUM CHLORIDE 0.9% FLUSH
10.0000 mL | Freq: Once | INTRAVENOUS | Status: AC
  Administered 2019-12-17: 10 mL

## 2019-12-17 MED ORDER — HEPARIN SOD (PORK) LOCK FLUSH 100 UNIT/ML IV SOLN
500.0000 [IU] | Freq: Once | INTRAVENOUS | Status: AC
  Administered 2019-12-17: 15:00:00 500 [IU] via INTRAVENOUS

## 2019-12-17 NOTE — Progress Notes (Signed)
Pollyann Samples presented for Portacath access and flush with lab draw.  Portacath located in the left chest wall accessed with  H 20 needle. Clean, Dry and Intact Good blood return present. Portacath flushed with 52ml NS and 500U/17ml Heparin per protocol and needle removed intact. Procedure without incident. Patient tolerated procedure well.   No complaints at this time. Discharged from clinic ambulatory. F/U with [facility] as scheduled.

## 2019-12-17 NOTE — Patient Instructions (Signed)
[facility]  Discharge Instructions:   _______________________________________________________________  Thank you for choosing [facility] at [facility] to provide your oncology and hematology care.  To afford each patient quality time with our providers, please arrive at least 15 minutes before your scheduled appointment.  You need to re-schedule your appointment if you arrive 10 or more minutes late.  We strive to give you quality time with our providers, and arriving late affects you and other patients whose appointments are after yours.  Also, if you no show three or more times for appointments you may be dismissed from the clinic.  Again, thank you for choosing [facility] at [facility]. Our hope is that these requests will allow you access to exceptional care and in a timely manner. _______________________________________________________________  If you have questions after your visit, please contact our office at [phone] between the hours of 8:30 a.m. and 5:00 p.m. Voicemails left after 4:30 p.m. will not be returned until the following business day. _______________________________________________________________  For prescription refill requests, have your pharmacy contact our office. _______________________________________________________________  Recommendations made by the consultant and any test results will be sent to your referring physician. _______________________________________________________________ 

## 2019-12-18 LAB — CEA: CEA: 6.1 ng/mL — ABNORMAL HIGH (ref 0.0–4.7)

## 2019-12-22 ENCOUNTER — Encounter: Payer: Self-pay | Admitting: Internal Medicine

## 2019-12-24 ENCOUNTER — Other Ambulatory Visit: Payer: Self-pay

## 2019-12-24 ENCOUNTER — Inpatient Hospital Stay (HOSPITAL_COMMUNITY): Payer: Medicare Other | Attending: Hematology | Admitting: Hematology

## 2019-12-24 ENCOUNTER — Encounter (HOSPITAL_COMMUNITY): Payer: Self-pay | Admitting: Hematology

## 2019-12-24 VITALS — BP 100/64 | HR 91 | Temp 97.1°F | Resp 19 | Wt 217.5 lb

## 2019-12-24 DIAGNOSIS — K5730 Diverticulosis of large intestine without perforation or abscess without bleeding: Secondary | ICD-10-CM | POA: Diagnosis not present

## 2019-12-24 DIAGNOSIS — Z923 Personal history of irradiation: Secondary | ICD-10-CM | POA: Insufficient documentation

## 2019-12-24 DIAGNOSIS — M1610 Unilateral primary osteoarthritis, unspecified hip: Secondary | ICD-10-CM | POA: Insufficient documentation

## 2019-12-24 DIAGNOSIS — Z9221 Personal history of antineoplastic chemotherapy: Secondary | ICD-10-CM | POA: Diagnosis not present

## 2019-12-24 DIAGNOSIS — C20 Malignant neoplasm of rectum: Secondary | ICD-10-CM

## 2019-12-24 DIAGNOSIS — Z9049 Acquired absence of other specified parts of digestive tract: Secondary | ICD-10-CM | POA: Diagnosis not present

## 2019-12-24 DIAGNOSIS — R970 Elevated carcinoembryonic antigen [CEA]: Secondary | ICD-10-CM | POA: Diagnosis not present

## 2019-12-24 DIAGNOSIS — R748 Abnormal levels of other serum enzymes: Secondary | ICD-10-CM | POA: Insufficient documentation

## 2019-12-24 DIAGNOSIS — Z85048 Personal history of other malignant neoplasm of rectum, rectosigmoid junction, and anus: Secondary | ICD-10-CM | POA: Diagnosis not present

## 2019-12-24 NOTE — Progress Notes (Signed)
[place], [place] [address]   CLINIC:  Medical Oncology/Hematology  PCP:  Rosita Fire, MD [place] [id number] [phone]   REASON FOR VISIT:  Follow-up for  Rectal Adenocarcinoma  CURRENT THERAPY: Clinical Surveillance   BRIEF ONCOLOGIC HISTORY:  Oncology History  Rectal cancer ([place])  03/31/2015 Initial Biopsy   Rectum, biopsy, rectal mass - INVASIVE ADENOCARCINOMA   06/02/2015 Tumor Marker   Results for REQUAN, HARDGE (MRN [id number]) as of 08/28/2015 09:09  06/02/2015 16:00 CEA: 4.3    06/08/2015 Imaging   CT abd/pelvis-Rectal primary, without evidence of metastatic disease or acute complication.   06/24/2015 Imaging   CT chest- No specific features identified to suggest metastatic disease to the chest.   07/06/2015 Definitive Surgery   Robotic-assisted lower anterior resrction, rigid proctoscopy [name])- INVASIVE ADENOCARCINOMA, 2 cm, TUMOR INVADES INTO SUBMUCOSA. LVI/PNI (+).  1/14 LN (+). Margins neg.    07/06/2015 Miscellaneous   IHC Tumor Expression Results normal.  Negative for MLH1, MSH2, MSH6, & PMS2.    07/06/2015 Pathologic Stage   pT1, pN1a, pMx   08/15/2015 - 11/16/2015 Chemotherapy   FOLFOX x 6 cycles (adjuvant chemo "phase 1")   09/26/2015 Treatment Plan Change   Treatment held today.  5FU bolus D/C'd.  Neulasta OnPro added to each subsequent cycles of therapy.   10/04/2015 Treatment Plan Change   Neulasta onpro added to all subsequent cycles of treatment.   10/18/2015 Treatment Plan Change   Tx held for thrombocytopenia.  5FU CI is reduced by 20% for subsequent treatments.   12/05/2015 - 01/16/2016 Chemotherapy   5FU continuous infusion weekly x 6 cycles; concurrent radiation. (adjuvant chemo "phase 2")   12/05/2015 - 01/18/2016 Radiation Therapy   Pelvic radiation completed in [place] [facility]).  Pelvis 45 Gy in 25 fractions.  Then cone down boost treatment for additional 5.4 Gy in 8 fractions.   Total dose: 50.4 Gy in 33 treatments   01/30/2016 - 02/13/2016 Chemotherapy   FOLFOX x 2 cycles to complete 6 months of adjuvant chemotherapy.  (adjuvant chemo "phase 3")   05/21/2016 Procedure   Colonoscopy by Dr. Gala Romney- The procedure was aborted due to inadequate bowel prep. Patient readily admits to eating at least applesauce yesterday which was in conflict with our written and verbal preparation instructions. No specimens collected.   06/19/2016 Imaging   CT abd/pelvis- Postsurgical changes of the rectum. New presacral soft tissue thickening likely post treatment related. Recommend attention on followup. Large amount of stool in the colon compatible with constipation. No evidence for distant metastatic dz.    Imaging   CT abd/pelvis: IMPRESSION: Stable exam. No evidence of recurrent for metastatic carcinoma within the abdomen or pelvis.  Mild decrease in large colonic stool burden since prior study. No acute findings      CANCER STAGING: Cancer Staging Rectal cancer [facility]) Staging form: Colon and Rectum, AJCC 7th Edition - Pathologic stage from 09/12/2015: Stage IIIA (T1, N1a, cM0) - Signed by [name], PA-C on 09/25/2015 - Clinical stage from 09/26/2015: Stage IIIA (T1, N1a, M0) - Signed by [name], PA-C on 09/25/2015    INTERVAL HISTORY:  Mr. Asato 59 y.o. male seen for follow-up of the rectal cancer.  Appetite is 100%.  Energy levels are 50%.  Pain is reported as 4 out of 10 in the right lower back.  Shortness of breath on exertion is stable.  Denies any recent pneumonias or other infections.  REVIEW OF SYSTEMS:  Review of Systems  Respiratory: Positive for shortness of breath.   All other systems reviewed and are negative.    PAST MEDICAL/SURGICAL HISTORY:  Past Medical History:  Diagnosis Date  . Arthritis   . Cancer ([place])    rectal  . Depression   . Diabetes mellitus    "Borderline"  . GERD (gastroesophageal reflux disease)    No weakness  .  High cholesterol   . Hypertension   . Neuropathy    "back missed up"  . Rectal cancer ([place])   . Sleep apnea    Past Surgical History:  Procedure Laterality Date  . BACK SURGERY    . BIOPSY  03/31/2015   Procedure: BIOPSY;  Surgeon: Daneil Dolin, MD;  [facility];  Service: Endoscopy;;  . CHOLECYSTECTOMY    . COLONOSCOPY WITH PROPOFOL N/A 03/31/2015   Procedure: COLONOSCOPY WITH PROPOFOL at cecum 0842; withdrawal time=2mnutes;  Surgeon: RDaneil Dolin MD;  Location: [facility];  Service: Endoscopy;  Laterality: N/A;  . COLONOSCOPY WITH PROPOFOL N/A 05/21/2016   Procedure: COLONOSCOPY WITH PROPOFOL;  Surgeon: RDaneil Dolin MD;  [facility];  Service: Endoscopy;  Laterality: N/A;  200 - moved to 12:45 - office calling pt with 11:15 arrival time  . COLONOSCOPY WITH PROPOFOL N/A 07/26/2016   Procedure: COLONOSCOPY WITH PROPOFOL;  Surgeon: RDaneil Dolin MD;  [facility];  Service: Endoscopy;  Laterality: N/A;  115  . COLONOSCOPY WITH PROPOFOL N/A 07/28/2018   Procedure: COLONOSCOPY WITH PROPOFOL;  Surgeon: RDaneil Dolin MD;  [facility];  Service: Endoscopy;  Laterality: N/A;  1:45pm - unable to reach pt to move up  . FLEXIBLE SIGMOIDOSCOPY N/A 07/05/2015   Procedure: FLEXIBLE SIGMOIDOSCOPY;  Surgeon: ALeighton Ruff MD;  [facility];  Service: Endoscopy;  Laterality: N/A;  with tattoo  . POLYPECTOMY  03/31/2015   Procedure: POLYPECTOMY;  Surgeon: RDaneil Dolin MD;  [facility];  Service: Endoscopy;;  . PORTACATH PLACEMENT N/A 08/09/2015   Procedure: INSERTION PORT-A-CATH LEFT SUBCLAVIAN;  Surgeon: ALeighton Ruff MD;  Location: [facility]  Service: General;  Laterality: N/A;  . XI ROBOTIC ASSISTED LOWER ANTERIOR RESECTION N/A 07/06/2015   Procedure: XI ROBOTIC ASSISTED LOWER ANTERIOR RESECTION, rigid proctoscopy;  Surgeon: ALeighton Ruff MD;  [facility];  Service: General;  Laterality: N/A;     SOCIAL HISTORY:  Social History    Socioeconomic History  . Marital status: Divorced    Spouse name: Not on file  . Number of children: Not on file  . Years of education: Not on file  . Highest education level: Not on file  Occupational History  . Not on file  Tobacco Use  . Smoking status: Former Smoker    Packs/day: 0.25    Years: 2.00    Pack years: 0.50    Types: Cigarettes    Quit date: 03/23/2006    Years since quitting: 13.7  . Smokeless tobacco: Never Used  Substance and Sexual Activity  . Alcohol use: No    Alcohol/week: 0.0 standard drinks  . Drug use: No  . Sexual activity: Not Currently  Other Topics Concern  . Not on file  Social History Narrative  . Not on file   Social Determinants of Health   Financial Resource Strain:   . Difficulty of Paying Living Expenses: Not on file  Food Insecurity:   . Worried About RCharity fundraiserin the Last Year:  Not on file  . Ran Out of Food in the Last Year: Not on file  Transportation Needs:   . Lack of Transportation (Medical): Not on file  . Lack of Transportation (Non-Medical): Not on file  Physical Activity:   . Days of Exercise per Week: Not on file  . Minutes of Exercise per Session: Not on file  Stress:   . Feeling of Stress : Not on file  Social Connections:   . Frequency of Communication with Friends and Family: Not on file  . Frequency of Social Gatherings with Friends and Family: Not on file  . Attends Religious Services: Not on file  . Active Member of Clubs or Organizations: Not on file  . Attends Archivist Meetings: Not on file  . Marital Status: Not on file  Intimate Partner Violence:   . Fear of Current or Ex-Partner: Not on file  . Emotionally Abused: Not on file  . Physically Abused: Not on file  . Sexually Abused: Not on file    FAMILY HISTORY:  Family History  Problem Relation Age of Onset  . Hypertension Other        "Not sure who, I don't know much about my family"  . Diabetes Other        "Not sure who,  I don't know much about my family"  . Colon cancer Neg Hx        "Not that I know of"  . Colon polyps Neg Hx        "not that I know of"    CURRENT MEDICATIONS:  Outpatient Encounter Medications as of 12/24/2019  Medication Sig  . ACCU-CHEK FASTCLIX LANCETS MISC   . ACCU-CHEK SMARTVIEW test strip   . Alcohol Swabs (B-D SINGLE USE SWABS REGULAR) PADS   . amLODipine (NORVASC) 10 MG tablet Take 10 mg by mouth daily.   Marland Kitchen gabapentin (NEURONTIN) 800 MG tablet Take 1 tablet (800 mg total) by mouth 3 (three) times daily. (Patient taking differently: Take 800 mg by mouth 2 (two) times daily. )  . lisinopril (PRINIVIL,ZESTRIL) 40 MG tablet Take 1 tablet (40 mg total) by mouth every morning.  . NON FORMULARY PT HAS A C-PAP MACHINE  . nortriptyline (PAMELOR) 75 MG capsule Take 1 capsule (75 mg total) by mouth at bedtime.  Marland Kitchen oxyCODONE-acetaminophen (PERCOCET) 10-325 MG tablet Take 1 tablet by mouth 5 (five) times daily.  Marland Kitchen tiZANidine (ZANAFLEX) 2 MG tablet Take 1 tablet (2 mg total) by mouth every 8 (eight) hours as needed for muscle spasms. (Patient taking differently: Take 2 mg by mouth 2 (two) times daily. )  . [DISCONTINUED] prochlorperazine (COMPAZINE) 10 MG tablet Take 1 tablet (10 mg total) by mouth every 6 (six) hours as needed (Nausea or vomiting). (Patient not taking: Reported on 10/06/2015)   No facility-administered encounter medications on file as of 12/24/2019.    ALLERGIES:  No Known Allergies   PHYSICAL EXAM:  ECOG Performance status: 2  Vitals:   12/24/19 1537  BP: 100/64  Pulse: 91  Resp: 19  Temp: (!) 97.1 F (36.2 C)  SpO2: 95%   Filed Weights   12/24/19 1537  Weight: 217 lb 8 oz (98.7 kg)    Physical Exam Constitutional:      Appearance: Normal appearance. He is obese.  HENT:     Head: Normocephalic.     Nose: Nose normal.     Mouth/Throat:     Mouth: Mucous membranes are moist.  Pharynx: Oropharynx is clear.  Eyes:     Extraocular Movements: Extraocular  movements intact.     Conjunctiva/sclera: Conjunctivae normal.  Cardiovascular:     Rate and Rhythm: Normal rate and regular rhythm.     Pulses: Normal pulses.     Heart sounds: Normal heart sounds.  Pulmonary:     Effort: Pulmonary effort is normal.     Breath sounds: Normal breath sounds.  Abdominal:     General: Bowel sounds are normal.     Palpations: Abdomen is soft.  Musculoskeletal:        General: Normal range of motion.     Cervical back: Normal range of motion.  Skin:    General: Skin is warm and dry.  Neurological:     General: No focal deficit present.     Mental Status: He is alert and oriented to person, place, and time.  Psychiatric:        Mood and Affect: Mood normal.        Behavior: Behavior normal.        Thought Content: Thought content normal.        Judgment: Judgment normal.      LABORATORY DATA:  I have reviewed the labs as listed.  CBC    Component Value Date/Time   WBC 5.3 12/17/2019 1507   RBC 4.18 (L) 12/17/2019 1507   HGB 12.2 (L) 12/17/2019 1507   HCT 38.9 (L) 12/17/2019 1507   PLT 175 12/17/2019 1507   MCV 93.1 12/17/2019 1507   MCH 29.2 12/17/2019 1507   MCHC 31.4 12/17/2019 1507   RDW 12.8 12/17/2019 1507   LYMPHSABS 1.2 12/17/2019 1507   MONOABS 0.5 12/17/2019 1507   EOSABS 0.4 12/17/2019 1507   BASOSABS 0.0 12/17/2019 1507   CMP Latest Ref Rng & Units 12/17/2019 08/26/2019 06/01/2019  Glucose 70 - 99 mg/dL 105(H) 92 96  BUN 6 - 20 mg/dL 19 11 21(H)  Creatinine 0.61 - 1.24 mg/dL 1.29(H) 1.02 1.27(H)  Sodium 135 - 145 mmol/L 138 138 139  Potassium 3.5 - 5.1 mmol/L 4.1 4.0 3.7  Chloride 98 - 111 mmol/L 105 102 106  CO2 22 - 32 mmol/L '24 26 23  '$ Calcium 8.9 - 10.3 mg/dL 9.1 9.3 9.3  Total Protein 6.5 - 8.1 g/dL 7.6 7.6 7.3  Total Bilirubin 0.3 - 1.2 mg/dL 0.8 0.6 0.5  Alkaline Phos 38 - 126 U/L 71 72 63  AST 15 - 41 U/L 38 33 25  ALT 0 - 44 U/L 60(H) 45(H) 37     Radiology: I have independently reviewed his CT scan images  and discussed with the patient.    ASSESSMENT & PLAN:   Rectal cancer ([place]) 1.  Stage III (PT1PN1A) rectal adenocarcinoma: - Status post segmental resection of the rectosigmoid cancer on 07/06/2015, pathology showing 1/14 lymph node positive, margins negative, MMR normal. - 6 cycles of adjuvant FOLFOX from 08/15/2015 through 11/06/2015 followed by 5-FU and XRT from 12/05/2015 through 01/16/2016 followed by 2 more cycles of FOLFOX from 01/30/2016 through 02/13/2016. -Last colonoscopy on 07/28/2018 showing diverticulosis of the sigmoid colon and descending colon. - CT CAP on 05/27/2019 showed postoperative findings of the low anterior resection with reanastomosis with no evidence of recurrence or metastatic disease in the chest, abdomen and pelvis. -Last CEA was 7.2 in October 2020.  We reviewed labs from 12/17/2019.  CEA is down to 6.1. -He denies any smoking history or liver disease.  His ALT has gone up to 60. -  I have recommended follow-up in 4 months with repeat CEA and a CT scan of the abdomen and pelvis.  2.  Chronic hip pain: - This is from osteoarthritis.  He uses oxycodone as needed.     Orders placed this encounter:  Orders Placed This Encounter  Procedures  . CT Chest W Contrast  . CT Abdomen Pelvis W Contrast  . CBC with Differential/Platelet  . Comprehensive metabolic panel  . CEA      Derek Jack, MD [place] [phone]

## 2019-12-24 NOTE — Assessment & Plan Note (Signed)
1.  Stage III (PT1PN1A) rectal adenocarcinoma: - Status post segmental resection of the rectosigmoid cancer on 07/06/2015, pathology showing 1/14 lymph node positive, margins negative, MMR normal. - 6 cycles of adjuvant FOLFOX from 08/15/2015 through 11/06/2015 followed by 5-FU and XRT from 12/05/2015 through 01/16/2016 followed by 2 more cycles of FOLFOX from 01/30/2016 through 02/13/2016. -Last colonoscopy on 07/28/2018 showing diverticulosis of the sigmoid colon and descending colon. - CT CAP on 05/27/2019 showed postoperative findings of the low anterior resection with reanastomosis with no evidence of recurrence or metastatic disease in the chest, abdomen and pelvis. -Last CEA was 7.2 in October 2020.  We reviewed labs from 12/17/2019.  CEA is down to 6.1. -He denies any smoking history or liver disease.  His ALT has gone up to 60. -I have recommended follow-up in 4 months with repeat CEA and a CT scan of the abdomen and pelvis.  2.  Chronic hip pain: - This is from osteoarthritis.  He uses oxycodone as needed.

## 2019-12-29 DIAGNOSIS — M25569 Pain in unspecified knee: Secondary | ICD-10-CM | POA: Diagnosis not present

## 2019-12-29 DIAGNOSIS — G894 Chronic pain syndrome: Secondary | ICD-10-CM | POA: Diagnosis not present

## 2019-12-29 DIAGNOSIS — M79605 Pain in left leg: Secondary | ICD-10-CM | POA: Diagnosis not present

## 2019-12-29 DIAGNOSIS — M47816 Spondylosis without myelopathy or radiculopathy, lumbar region: Secondary | ICD-10-CM | POA: Diagnosis not present

## 2020-01-06 ENCOUNTER — Encounter: Payer: Self-pay | Admitting: Nurse Practitioner

## 2020-01-07 ENCOUNTER — Ambulatory Visit: Payer: Medicare Other | Admitting: Nurse Practitioner

## 2020-01-15 DIAGNOSIS — M549 Dorsalgia, unspecified: Secondary | ICD-10-CM | POA: Diagnosis not present

## 2020-01-15 DIAGNOSIS — E785 Hyperlipidemia, unspecified: Secondary | ICD-10-CM | POA: Diagnosis not present

## 2020-01-26 DIAGNOSIS — Z79899 Other long term (current) drug therapy: Secondary | ICD-10-CM | POA: Diagnosis not present

## 2020-01-26 DIAGNOSIS — M25569 Pain in unspecified knee: Secondary | ICD-10-CM | POA: Diagnosis not present

## 2020-01-26 DIAGNOSIS — G894 Chronic pain syndrome: Secondary | ICD-10-CM | POA: Diagnosis not present

## 2020-01-26 DIAGNOSIS — Z79891 Long term (current) use of opiate analgesic: Secondary | ICD-10-CM | POA: Diagnosis not present

## 2020-01-26 DIAGNOSIS — M47816 Spondylosis without myelopathy or radiculopathy, lumbar region: Secondary | ICD-10-CM | POA: Diagnosis not present

## 2020-01-26 DIAGNOSIS — M79605 Pain in left leg: Secondary | ICD-10-CM | POA: Diagnosis not present

## 2020-01-27 ENCOUNTER — Ambulatory Visit: Payer: Medicare Other | Admitting: Nurse Practitioner

## 2020-01-27 ENCOUNTER — Encounter: Payer: Self-pay | Admitting: Internal Medicine

## 2020-01-27 ENCOUNTER — Telehealth: Payer: Self-pay | Admitting: Internal Medicine

## 2020-01-27 NOTE — Progress Notes (Deleted)
Referring Provider: Rosita Fire, MD Primary Care Physician:  Rosita Fire, MD Primary GI:  Dr. Gala Romney  No chief complaint on file.   HPI:   [name] is a 59 y.o. male who presents on referral from primary care for colonoscopy.  Nurse/phone triage was deferred to office visit due to poor prep and propofol needs.  Reviewed information provided with referral including ***.  Of note the patient apparently has a history of rectal cancer and is followed by oncology.  The patient was last seen in our office 06/12/2018 for history of rectal cancer and need for colonoscopy.  At that time he was rescheduling his colonoscopy due to poor prep stating he did not follow his instructions last time.  Prior to the most recent attempt with poor prep his colonoscopy was completed 07/26/2016 which found diverticulosis in the sigmoid colon, single 10 mm polyp in the hepatic flexure, otherwise normal.  No surgical pathology report was found.  Followed by oncology for his rectal cancer.  Underwent low anterior resection previously and chemotherapy with radiation.  He completed oncological treatment of his cancer and was noted to be overdue for repeat colonoscopy.  CT on 06/21/2017 showed recurrence of disease.  Attempt at colonoscopy 05/08/2018 was canceled due to patient not prepped or cleaned out.  At his last visit he states he followed his directions backwards.  No overt GI complaints.  Recommended reattempt at scheduling colonoscopy, follow instructions step-by-step, to the letter.  Further recommendations to follow.  A reattempt of his colonoscopy 07/28/2018 found diverticulosis in the sigmoid colon and descending colon, long redundant colon, otherwise normal.  Recommended repeat colonoscopy in 1 year for surveillance.  However, it was noted his bowel prep was inadequate.  Previous prep included TriLyte split prep without pre-prep, Fleet enema the morning of the procedure.  Recently saw oncology 12/24/2019 for  rectal cancer.  At that time it was noted most recent CT on 05/27/2019 with postoperative findings, no evidence of recurrence or metastatic disease in the chest, abdomen, pelvis.  CEA declined from 7.2-6.1 between October 2020 and 12/17/2019.  His ALT has increased slightly to 60.  Recommended follow-up in 4 months with CEA and CT scan.  Today he states   Past Medical History:  Diagnosis Date  . Arthritis   . Cancer ([place])    rectal  . Depression   . Diabetes mellitus    "Borderline"  . GERD (gastroesophageal reflux disease)    No weakness  . High cholesterol   . Hypertension   . Neuropathy    "back missed up"  . Rectal cancer ([place])   . Sleep apnea     Past Surgical History:  Procedure Laterality Date  . BACK SURGERY    . BIOPSY  03/31/2015   Procedure: BIOPSY;  Surgeon: Daneil Dolin, MD;  [facility];  Service: Endoscopy;;  . CHOLECYSTECTOMY    . COLONOSCOPY WITH PROPOFOL N/A 03/31/2015   Procedure: COLONOSCOPY WITH PROPOFOL at cecum 0842; withdrawal time=80minutes;  Surgeon: Daneil Dolin, MD;  [facility];  Service: Endoscopy;  Laterality: N/A;  . COLONOSCOPY WITH PROPOFOL N/A 05/21/2016   Procedure: COLONOSCOPY WITH PROPOFOL;  Surgeon: Daneil Dolin, MD;  [facility];  Service: Endoscopy;  Laterality: N/A;  200 - moved to 12:45 - office calling pt with 11:15 arrival time  . COLONOSCOPY WITH PROPOFOL N/A 07/26/2016   Procedure: COLONOSCOPY WITH PROPOFOL;  Surgeon: Daneil Dolin, MD;  [facility];  Service: Endoscopy;  Laterality: N/A;  115  . COLONOSCOPY WITH PROPOFOL N/A 07/28/2018   Procedure: COLONOSCOPY WITH PROPOFOL;  Surgeon: Daneil Dolin, MD;  [facility];  Service: Endoscopy;  Laterality: N/A;  1:45pm - unable to reach pt to move up  . FLEXIBLE SIGMOIDOSCOPY N/A 07/05/2015   Procedure: FLEXIBLE SIGMOIDOSCOPY;  Surgeon: [name], MD;  [facility];  Service: Endoscopy;  Laterality: N/A;  with tattoo  . POLYPECTOMY   03/31/2015   Procedure: POLYPECTOMY;  Surgeon: Daneil Dolin, MD;  [facility];  Service: Endoscopy;;  . PORTACATH PLACEMENT N/A 08/09/2015   Procedure: INSERTION PORT-A-CATH LEFT SUBCLAVIAN;  Surgeon: [name], MD;  Location: [facility];  Service: General;  Laterality: N/A;  . XI ROBOTIC ASSISTED LOWER ANTERIOR RESECTION N/A 07/06/2015   Procedure: XI ROBOTIC ASSISTED LOWER ANTERIOR RESECTION, rigid proctoscopy;  Surgeon: [name], MD;  [facility];  Service: General;  Laterality: N/A;    Current Outpatient Medications  Medication Sig Dispense Refill  . ACCU-CHEK FASTCLIX LANCETS MISC     . ACCU-CHEK SMARTVIEW test strip     . Alcohol Swabs (B-D SINGLE USE SWABS REGULAR) PADS     . amLODipine (NORVASC) 10 MG tablet Take 10 mg by mouth daily.     Marland Kitchen gabapentin (NEURONTIN) 800 MG tablet Take 1 tablet (800 mg total) by mouth 3 (three) times daily. (Patient taking differently: Take 800 mg by mouth 2 (two) times daily. ) 90 tablet 2  . lisinopril (PRINIVIL,ZESTRIL) 40 MG tablet Take 1 tablet (40 mg total) by mouth every morning. 30 tablet 2  . NON FORMULARY PT HAS A C-PAP MACHINE    . nortriptyline (PAMELOR) 75 MG capsule Take 1 capsule (75 mg total) by mouth at bedtime. 30 capsule 2  . oxyCODONE-acetaminophen (PERCOCET) 10-325 MG tablet Take 1 tablet by mouth 5 (five) times daily.    Marland Kitchen tiZANidine (ZANAFLEX) 2 MG tablet Take 1 tablet (2 mg total) by mouth every 8 (eight) hours as needed for muscle spasms. (Patient taking differently: Take 2 mg by mouth 2 (two) times daily. ) 30 tablet 2   No current facility-administered medications for this visit.    Allergies as of 01/27/2020  . (No Known Allergies)    Family History  Problem Relation Age of Onset  . Hypertension Other        "Not sure who, I don't know much about my family"  . Diabetes Other        "Not sure who, I don't know much about my family"  . Colon cancer Neg Hx        "Not that I know of"  . Colon polyps Neg  Hx        "not that I know of"    Social History   Socioeconomic History  . Marital status: Divorced    Spouse name: Not on file  . Number of children: Not on file  . Years of education: Not on file  . Highest education level: Not on file  Occupational History  . Not on file  Tobacco Use  . Smoking status: Former Smoker    Packs/day: 0.25    Years: 2.00    Pack years: 0.50    Types: Cigarettes    Quit date: 03/23/2006    Years since quitting: 13.8  . Smokeless tobacco: Never Used  Substance and Sexual Activity  . Alcohol use: No    Alcohol/week: 0.0 standard drinks  .  Drug use: No  . Sexual activity: Not Currently  Other Topics Concern  . Not on file  Social History Narrative  . Not on file   Social Determinants of Health   Financial Resource Strain:   . Difficulty of Paying Living Expenses: Not on file  Food Insecurity:   . Worried About Charity fundraiser in the Last Year: Not on file  . Ran Out of Food in the Last Year: Not on file  Transportation Needs:   . Lack of Transportation (Medical): Not on file  . Lack of Transportation (Non-Medical): Not on file  Physical Activity:   . Days of Exercise per Week: Not on file  . Minutes of Exercise per Session: Not on file  Stress:   . Feeling of Stress : Not on file  Social Connections:   . Frequency of Communication with Friends and Family: Not on file  . Frequency of Social Gatherings with Friends and Family: Not on file  . Attends Religious Services: Not on file  . Active Member of Clubs or Organizations: Not on file  . Attends Archivist Meetings: Not on file  . Marital Status: Not on file    Review of Systems: General: Negative for anorexia, weight loss, fever, chills, fatigue, weakness. Eyes: Negative for vision changes.  ENT: Negative for hoarseness, difficulty swallowing , nasal congestion. CV: Negative for chest pain, angina, palpitations, dyspnea on exertion, peripheral edema.  Respiratory:  Negative for dyspnea at rest, dyspnea on exertion, cough, sputum, wheezing.  GI: See history of present illness. GU:  Negative for dysuria, hematuria, urinary incontinence, urinary frequency, nocturnal urination.  MS: Negative for joint pain, low back pain.  Derm: Negative for rash or itching.  Neuro: Negative for weakness, abnormal sensation, seizure, frequent headaches, memory loss, confusion.  Psych: Negative for anxiety, depression, suicidal ideation, hallucinations.  Endo: Negative for unusual weight change.  Heme: Negative for bruising or bleeding. Allergy: Negative for rash or hives.   Physical Exam: There were no vitals taken for this visit. General:   Alert and oriented. Pleasant and cooperative. Well-nourished and well-developed.  Head:  Normocephalic and atraumatic. Eyes:  Without icterus, sclera clear and conjunctiva pink.  Ears:  Normal auditory acuity. Mouth:  No deformity or lesions, oral mucosa pink.  Throat/Neck:  Supple, without mass or thyromegaly. Cardiovascular:  S1, S2 present without murmurs appreciated. Normal pulses noted. Extremities without clubbing or edema. Respiratory:  Clear to auscultation bilaterally. No wheezes, rales, or rhonchi. No distress.  Gastrointestinal:  +BS, soft, non-tender and non-distended. No HSM noted. No guarding or rebound. No masses appreciated.  Rectal:  Deferred  Musculoskalatal:  Symmetrical without gross deformities. Normal posture. Skin:  Intact without significant lesions or rashes. Neurologic:  Alert and oriented x4;  grossly normal neurologically. Psych:  Alert and cooperative. Normal mood and affect. Heme/Lymph/Immune: No significant cervical adenopathy. No excessive bruising noted.    01/27/2020 10:43 AM   Disclaimer: This note was dictated with voice recognition software. Similar sounding words can inadvertently be transcribed and may not be corrected upon review.

## 2020-01-27 NOTE — Telephone Encounter (Signed)
Patient was a no show and letter sent  °

## 2020-02-12 DIAGNOSIS — M549 Dorsalgia, unspecified: Secondary | ICD-10-CM | POA: Diagnosis not present

## 2020-02-12 DIAGNOSIS — E785 Hyperlipidemia, unspecified: Secondary | ICD-10-CM | POA: Diagnosis not present

## 2020-02-23 DIAGNOSIS — M25569 Pain in unspecified knee: Secondary | ICD-10-CM | POA: Diagnosis not present

## 2020-02-23 DIAGNOSIS — M79605 Pain in left leg: Secondary | ICD-10-CM | POA: Diagnosis not present

## 2020-02-23 DIAGNOSIS — M47816 Spondylosis without myelopathy or radiculopathy, lumbar region: Secondary | ICD-10-CM | POA: Diagnosis not present

## 2020-02-23 DIAGNOSIS — G894 Chronic pain syndrome: Secondary | ICD-10-CM | POA: Diagnosis not present

## 2020-03-14 DIAGNOSIS — M549 Dorsalgia, unspecified: Secondary | ICD-10-CM | POA: Diagnosis not present

## 2020-03-14 DIAGNOSIS — I10 Essential (primary) hypertension: Secondary | ICD-10-CM | POA: Diagnosis not present

## 2020-03-29 DIAGNOSIS — M79605 Pain in left leg: Secondary | ICD-10-CM | POA: Diagnosis not present

## 2020-03-29 DIAGNOSIS — M25569 Pain in unspecified knee: Secondary | ICD-10-CM | POA: Diagnosis not present

## 2020-03-29 DIAGNOSIS — G894 Chronic pain syndrome: Secondary | ICD-10-CM | POA: Diagnosis not present

## 2020-03-29 DIAGNOSIS — M47816 Spondylosis without myelopathy or radiculopathy, lumbar region: Secondary | ICD-10-CM | POA: Diagnosis not present

## 2020-04-13 ENCOUNTER — Encounter (HOSPITAL_COMMUNITY): Payer: Self-pay

## 2020-04-13 ENCOUNTER — Other Ambulatory Visit: Payer: Self-pay

## 2020-04-13 ENCOUNTER — Inpatient Hospital Stay (HOSPITAL_COMMUNITY): Payer: Medicare Other | Attending: Hematology

## 2020-04-13 DIAGNOSIS — Z85048 Personal history of other malignant neoplasm of rectum, rectosigmoid junction, and anus: Secondary | ICD-10-CM | POA: Insufficient documentation

## 2020-04-13 DIAGNOSIS — C20 Malignant neoplasm of rectum: Secondary | ICD-10-CM

## 2020-04-13 DIAGNOSIS — E785 Hyperlipidemia, unspecified: Secondary | ICD-10-CM | POA: Diagnosis not present

## 2020-04-13 DIAGNOSIS — I10 Essential (primary) hypertension: Secondary | ICD-10-CM | POA: Diagnosis not present

## 2020-04-13 LAB — COMPREHENSIVE METABOLIC PANEL
ALT: 55 U/L — ABNORMAL HIGH (ref 0–44)
AST: 49 U/L — ABNORMAL HIGH (ref 15–41)
Albumin: 4.1 g/dL (ref 3.5–5.0)
Alkaline Phosphatase: 73 U/L (ref 38–126)
Anion gap: 8 (ref 5–15)
BUN: 13 mg/dL (ref 6–20)
CO2: 24 mmol/L (ref 22–32)
Calcium: 8.9 mg/dL (ref 8.9–10.3)
Chloride: 109 mmol/L (ref 98–111)
Creatinine, Ser: 1.26 mg/dL — ABNORMAL HIGH (ref 0.61–1.24)
GFR calc Af Amer: >60 mL/min (ref >60)
GFR calc non Af Amer: >60 mL/min (ref >60)
Glucose, Bld: 136 mg/dL — ABNORMAL HIGH (ref 70–99)
Potassium: 3.9 mmol/L (ref 3.5–5.1)
Sodium: 141 mmol/L (ref 135–145)
Total Bilirubin: 0.9 mg/dL (ref 0.3–1.2)
Total Protein: 7.4 g/dL (ref 6.5–8.1)

## 2020-04-13 LAB — CBC WITH DIFFERENTIAL/PLATELET
Abs Immature Granulocytes: 0.02 10*3/uL (ref 0.00–0.07)
Basophils Absolute: 0 10*3/uL (ref 0.0–0.1)
Basophils Relative: 1 %
Eosinophils Absolute: 0.4 10*3/uL (ref 0.0–0.5)
Eosinophils Relative: 9 %
HCT: 39.8 % (ref 39.0–52.0)
Hemoglobin: 12.3 g/dL — ABNORMAL LOW (ref 13.0–17.0)
Immature Granulocytes: 1 %
Lymphocytes Relative: 21 %
Lymphs Abs: 0.8 10*3/uL (ref 0.7–4.0)
MCH: 28.7 pg (ref 26.0–34.0)
MCHC: 30.9 g/dL (ref 30.0–36.0)
MCV: 93 fL (ref 80.0–100.0)
Monocytes Absolute: 0.3 10*3/uL (ref 0.1–1.0)
Monocytes Relative: 8 %
Neutro Abs: 2.3 10*3/uL (ref 1.7–7.7)
Neutrophils Relative %: 60 %
Platelets: 158 10*3/uL (ref 150–400)
RBC: 4.28 MIL/uL (ref 4.22–5.81)
RDW: 13.2 % (ref 11.5–15.5)
WBC: 3.8 10*3/uL — ABNORMAL LOW (ref 4.0–10.5)
nRBC: 0 % (ref 0.0–0.2)

## 2020-04-13 MED ORDER — HEPARIN SOD (PORK) LOCK FLUSH 100 UNIT/ML IV SOLN
500.0000 [IU] | Freq: Once | INTRAVENOUS | Status: AC
  Administered 2020-04-13: 500 [IU] via INTRAVENOUS

## 2020-04-13 MED ORDER — SODIUM CHLORIDE 0.9% FLUSH
20.0000 mL | INTRAVENOUS | Status: DC | PRN
  Administered 2020-04-13: 20 mL via INTRAVENOUS

## 2020-04-13 NOTE — Patient Instructions (Signed)
[facility] at [facility] °Discharge Instructions ° °Labs drawn from portacath today. Follow-up as scheduled ° ° °Thank you for choosing [facility] at [facility] to provide your oncology and hematology care.  To afford each patient quality time with our provider, please arrive at least 15 minutes before your scheduled appointment time.  ° °If you have a lab appointment with the Cancer Center please come in thru the Main Entrance and check in at the main information desk. ° °You need to re-schedule your appointment should you arrive 10 or more minutes late.  We strive to give you quality time with our providers, and arriving late affects you and other patients whose appointments are after yours.  Also, if you no show three or more times for appointments you may be dismissed from the clinic at the providers discretion.     °Again, thank you for choosing [facility].  Our hope is that these requests will decrease the amount of time that you wait before being seen by our physicians.       °_____________________________________________________________ ° °Should you have questions after your visit to [facility], please contact our office at [phone] between the hours of 8:00 a.m. and 4:30 p.m.  Voicemails left after 4:00 p.m. will not be returned until the following business day.  For prescription refill requests, have your pharmacy contact our office and allow 72 hours.   ° °Due to Covid, you will need to wear a mask upon entering the hospital. If you do not have a mask, a mask will be given to you at the Main Entrance upon arrival. For doctor visits, patients may have 1 support person with them. For treatment visits, patients can not have anyone with them due to social distancing guidelines and our immunocompromised population.  ° ° ° °

## 2020-04-13 NOTE — Progress Notes (Signed)
[name] tolerated port lab draw well without complaints or incident. Port accessed with 20 gauge needle with blood drawn for labs ordered then flushed easily per protocol then de-accessed. VSS Pt discharged self ambulatory using his cane in satisfactory condition

## 2020-04-14 LAB — CEA: CEA: 5.6 ng/mL — ABNORMAL HIGH (ref 0.0–4.7)

## 2020-04-21 ENCOUNTER — Other Ambulatory Visit: Payer: Self-pay

## 2020-04-21 ENCOUNTER — Ambulatory Visit (HOSPITAL_COMMUNITY)
Admission: RE | Admit: 2020-04-21 | Discharge: 2020-04-21 | Disposition: A | Discharge status: Home / Self Care | Payer: Medicare Other | Source: Ambulatory Visit | Attending: Hematology | Admitting: Hematology

## 2020-04-21 DIAGNOSIS — C189 Malignant neoplasm of colon, unspecified: Secondary | ICD-10-CM | POA: Diagnosis not present

## 2020-04-21 DIAGNOSIS — C20 Malignant neoplasm of rectum: Secondary | ICD-10-CM

## 2020-04-21 MED ORDER — IOHEXOL 300 MG/ML  SOLN
100.0000 mL | Freq: Once | INTRAMUSCULAR | Status: AC | PRN
  Administered 2020-04-21: 100 mL via INTRAVENOUS

## 2020-04-22 ENCOUNTER — Telehealth (HOSPITAL_COMMUNITY): Payer: Self-pay | Admitting: *Deleted

## 2020-04-22 DIAGNOSIS — K5909 Other constipation: Secondary | ICD-10-CM

## 2020-04-22 MED ORDER — LACTULOSE 20 GM/30ML PO SOLN: 30.0000 mL | Freq: Every day | ORAL | 2 refills | Status: DC

## 2020-04-22 NOTE — Telephone Encounter (Signed)
Received results of CT scans performed yesterday, Dr. Delton Coombes reviewed the tests and gave the following verbal order. He advised that the patient has severe constipation and needs to be given lactulose. He advised the patient should take 43mL of lactulose every 3 hours until he has a bowel movement and then take 44mL of lactulose every night after that. I sent in the prescription with instructions of how to take the medication correctly to the patient's pharmacy.   I spoke with the patient about above and advised him of the scan results and of what Dr. Delton Coombes recommended him do. I explained the prescription that was sent to his pharmacy and how he should take the medication. The patient verbalized understanding about the medication and how it should be taken.

## 2020-04-26 DIAGNOSIS — Z79899 Other long term (current) drug therapy: Secondary | ICD-10-CM | POA: Diagnosis not present

## 2020-04-26 DIAGNOSIS — M79605 Pain in left leg: Secondary | ICD-10-CM | POA: Diagnosis not present

## 2020-04-26 DIAGNOSIS — Z79891 Long term (current) use of opiate analgesic: Secondary | ICD-10-CM | POA: Diagnosis not present

## 2020-04-26 DIAGNOSIS — G894 Chronic pain syndrome: Secondary | ICD-10-CM | POA: Diagnosis not present

## 2020-04-26 DIAGNOSIS — M47816 Spondylosis without myelopathy or radiculopathy, lumbar region: Secondary | ICD-10-CM | POA: Diagnosis not present

## 2020-04-26 DIAGNOSIS — M25569 Pain in unspecified knee: Secondary | ICD-10-CM | POA: Diagnosis not present

## 2020-04-27 ENCOUNTER — Inpatient Hospital Stay (HOSPITAL_COMMUNITY): Payer: Medicare Other | Attending: Hematology | Admitting: Hematology

## 2020-04-27 DIAGNOSIS — Z9221 Personal history of antineoplastic chemotherapy: Secondary | ICD-10-CM | POA: Insufficient documentation

## 2020-04-27 DIAGNOSIS — Z85048 Personal history of other malignant neoplasm of rectum, rectosigmoid junction, and anus: Secondary | ICD-10-CM | POA: Insufficient documentation

## 2020-04-27 DIAGNOSIS — E7800 Pure hypercholesterolemia, unspecified: Secondary | ICD-10-CM | POA: Insufficient documentation

## 2020-04-27 DIAGNOSIS — Z79899 Other long term (current) drug therapy: Secondary | ICD-10-CM | POA: Insufficient documentation

## 2020-04-27 DIAGNOSIS — K219 Gastro-esophageal reflux disease without esophagitis: Secondary | ICD-10-CM | POA: Insufficient documentation

## 2020-04-27 DIAGNOSIS — Z923 Personal history of irradiation: Secondary | ICD-10-CM | POA: Insufficient documentation

## 2020-04-27 DIAGNOSIS — I10 Essential (primary) hypertension: Secondary | ICD-10-CM | POA: Insufficient documentation

## 2020-04-27 DIAGNOSIS — Z8249 Family history of ischemic heart disease and other diseases of the circulatory system: Secondary | ICD-10-CM | POA: Insufficient documentation

## 2020-04-27 DIAGNOSIS — Z791 Long term (current) use of non-steroidal anti-inflammatories (NSAID): Secondary | ICD-10-CM | POA: Insufficient documentation

## 2020-04-27 DIAGNOSIS — F329 Major depressive disorder, single episode, unspecified: Secondary | ICD-10-CM | POA: Insufficient documentation

## 2020-04-27 DIAGNOSIS — E119 Type 2 diabetes mellitus without complications: Secondary | ICD-10-CM | POA: Insufficient documentation

## 2020-04-27 DIAGNOSIS — G4730 Sleep apnea, unspecified: Secondary | ICD-10-CM | POA: Insufficient documentation

## 2020-04-27 DIAGNOSIS — Z87891 Personal history of nicotine dependence: Secondary | ICD-10-CM | POA: Insufficient documentation

## 2020-04-27 DIAGNOSIS — Z833 Family history of diabetes mellitus: Secondary | ICD-10-CM | POA: Insufficient documentation

## 2020-05-04 ENCOUNTER — Inpatient Hospital Stay (HOSPITAL_BASED_OUTPATIENT_CLINIC_OR_DEPARTMENT_OTHER): Payer: Medicare Other | Admitting: Nurse Practitioner

## 2020-05-04 ENCOUNTER — Other Ambulatory Visit: Payer: Self-pay

## 2020-05-04 DIAGNOSIS — Z79899 Other long term (current) drug therapy: Secondary | ICD-10-CM | POA: Diagnosis not present

## 2020-05-04 DIAGNOSIS — Z833 Family history of diabetes mellitus: Secondary | ICD-10-CM | POA: Diagnosis not present

## 2020-05-04 DIAGNOSIS — Z9221 Personal history of antineoplastic chemotherapy: Secondary | ICD-10-CM | POA: Diagnosis not present

## 2020-05-04 DIAGNOSIS — C20 Malignant neoplasm of rectum: Secondary | ICD-10-CM

## 2020-05-04 DIAGNOSIS — Z791 Long term (current) use of non-steroidal anti-inflammatories (NSAID): Secondary | ICD-10-CM | POA: Diagnosis not present

## 2020-05-04 DIAGNOSIS — I10 Essential (primary) hypertension: Secondary | ICD-10-CM | POA: Diagnosis not present

## 2020-05-04 DIAGNOSIS — G4730 Sleep apnea, unspecified: Secondary | ICD-10-CM | POA: Diagnosis not present

## 2020-05-04 DIAGNOSIS — E119 Type 2 diabetes mellitus without complications: Secondary | ICD-10-CM | POA: Diagnosis not present

## 2020-05-04 DIAGNOSIS — Z923 Personal history of irradiation: Secondary | ICD-10-CM | POA: Diagnosis not present

## 2020-05-04 DIAGNOSIS — Z8249 Family history of ischemic heart disease and other diseases of the circulatory system: Secondary | ICD-10-CM | POA: Diagnosis not present

## 2020-05-04 DIAGNOSIS — K219 Gastro-esophageal reflux disease without esophagitis: Secondary | ICD-10-CM | POA: Diagnosis not present

## 2020-05-04 DIAGNOSIS — Z87891 Personal history of nicotine dependence: Secondary | ICD-10-CM | POA: Diagnosis not present

## 2020-05-04 DIAGNOSIS — Z85048 Personal history of other malignant neoplasm of rectum, rectosigmoid junction, and anus: Secondary | ICD-10-CM | POA: Diagnosis not present

## 2020-05-04 DIAGNOSIS — F329 Major depressive disorder, single episode, unspecified: Secondary | ICD-10-CM | POA: Diagnosis not present

## 2020-05-04 DIAGNOSIS — E7800 Pure hypercholesterolemia, unspecified: Secondary | ICD-10-CM | POA: Diagnosis not present

## 2020-05-04 NOTE — Assessment & Plan Note (Addendum)
1.  Stage III rectal adenocarcinoma: -Status post segmental resection of the rectosigmoid cancer on 07/06/2015, pathology showing 1/14 lymph nodes positive, margins negative, MMR normal. -6 cycles of adjuvant FOLFOX from 08/15/2015 through 11/06/2015 followed by 5-FU and XRT from 12/05/2015 through 01/16/2016 followed by 2 more cycles of FOLFOX from 01/30/2016 through 02/13/2016. -Last colonoscopy on 07/28/2018 showed diverticulosis of the sigmoid colon and descending colon. -CT CAP on 05/27/2019 showed postoperative findings in the low anterior resection with anastomosis with no evidence of recurrence or metastatic disease in the chest, abdomen or pelvis. -Last CEA was 7.2 in October 2020. -Labs done on 04/13/2020 showed hemoglobin 12.3, WBC 3.8, platelets 158, creatinine 1.26, AST 49, ALT 55.  CEA 5.6 -He denies any smoking history or liver disease. -Patient had CT CAP on 04/21/2020 which showed no evidence of metastatic disease.  Did show degree of colonic distention suggests constipation.  Cecal distention now is near 10 cm without perirectal stranding this is increased from prior scans.  He is now on a bowel regiment. -He will follow-up in 4 months with repeat labs.  2.  Chronic hip pain: -This is from osteoarthritis. -He uses oxycodone as needed.

## 2020-05-04 NOTE — Progress Notes (Signed)
[place], [name]   CLINIC:  Medical Oncology/Hematology  PCP:  Rosita Fire, MD [name] [phone]   REASON FOR VISIT: Follow-up for rectal cancer   CURRENT THERAPY: Clinical surveillance  BRIEF ONCOLOGIC HISTORY:  Oncology History  Rectal cancer ([place])  03/31/2015 Initial Biopsy   Rectum, biopsy, rectal mass - INVASIVE ADENOCARCINOMA   06/02/2015 Tumor Marker   Results for CANAAN, HOLZER (MRN [id number]) as of 08/28/2015 09:09  06/02/2015 16:00 CEA: 4.3    06/08/2015 Imaging   CT abd/pelvis-Rectal primary, without evidence of metastatic disease or acute complication.   06/24/2015 Imaging   CT chest- No specific features identified to suggest metastatic disease to the chest.   07/06/2015 Definitive Surgery   Robotic-assisted lower anterior resrction, rigid proctoscopy [name])- INVASIVE ADENOCARCINOMA, 2 cm, TUMOR INVADES INTO SUBMUCOSA. LVI/PNI (+).  1/14 LN (+). Margins neg.    07/06/2015 Miscellaneous   IHC Tumor Expression Results normal.  Negative for MLH1, MSH2, MSH6, & PMS2.    07/06/2015 Pathologic Stage   pT1, pN1a, pMx   08/15/2015 - 11/16/2015 Chemotherapy   FOLFOX x 6 cycles (adjuvant chemo "phase 1")   09/26/2015 Treatment Plan Change   Treatment held today.  5FU bolus D/C'd.  Neulasta OnPro added to each subsequent cycles of therapy.   10/04/2015 Treatment Plan Change   Neulasta onpro added to all subsequent cycles of treatment.   10/18/2015 Treatment Plan Change   Tx held for thrombocytopenia.  5FU CI is reduced by 20% for subsequent treatments.   12/05/2015 - 01/16/2016 Chemotherapy   5FU continuous infusion weekly x 6 cycles; concurrent radiation. (adjuvant chemo "phase 2")   12/05/2015 - 01/18/2016 Radiation Therapy   Pelvic radiation completed in [facility]).  Pelvis 45 Gy in 25 fractions.  Then cone down boost treatment for additional 5.4 Gy in 8 fractions.  Total  dose: 50.4 Gy in 33 treatments   01/30/2016 - 02/13/2016 Chemotherapy   FOLFOX x 2 cycles to complete 6 months of adjuvant chemotherapy.  (adjuvant chemo "phase 3")   05/21/2016 Procedure   Colonoscopy by Dr. Gala Romney- The procedure was aborted due to inadequate bowel prep. Patient readily admits to eating at least applesauce yesterday which was in conflict with our written and verbal preparation instructions. No specimens collected.   06/19/2016 Imaging   CT abd/pelvis- Postsurgical changes of the rectum. New presacral soft tissue thickening likely post treatment related. Recommend attention on followup. Large amount of stool in the colon compatible with constipation. No evidence for distant metastatic dz.    Imaging   CT abd/pelvis: IMPRESSION: Stable exam. No evidence of recurrent for metastatic carcinoma within the abdomen or pelvis.  Mild decrease in large colonic stool burden since prior study. No acute findings     CANCER STAGING: Cancer Staging Rectal cancer [facility]) Staging form: Colon and Rectum, AJCC 7th Edition - Pathologic stage from 09/12/2015: Stage IIIA (T1, N1a, cM0) - Signed by [name], PA-C on 09/25/2015 - Clinical stage from 09/26/2015: Stage IIIA (T1, N1a, M0) - Signed by [name], PA-C on 09/25/2015    INTERVAL HISTORY:  [name] 59 y.o. male returns for routine follow-up for rectal cancer.  Patient reports he has been doing well since last visit.  He reports he was placed on a bowel regimen.  Sometimes it leaves him going to the bathroom every 30 minutes depending on what he eats.  He does take stool  softeners which helps regulate his bowel movements better.  He denies any bright red bleeding per rectum or melena.  He denies any easy bruising or bleeding.  He denies any change in stool form. Denies any nausea, vomiting, or diarrhea. Denies any new pains. Had not noticed any recent bleeding such as epistaxis, hematuria or hematochezia. Denies recent chest  pain on exertion, shortness of breath on minimal exertion, pre-syncopal episodes, or palpitations. Denies any numbness or tingling in hands or feet. Denies any recent fevers, infections, or recent hospitalizations. Patient reports appetite at 100% and energy level at 100%.  He is eating well maintain his weight at this time.     REVIEW OF SYSTEMS:  Review of Systems  Respiratory: Positive for shortness of breath.   Neurological: Positive for dizziness.  Psychiatric/Behavioral: Positive for depression.  All other systems reviewed and are negative.    PAST MEDICAL/SURGICAL HISTORY:  Past Medical History:  Diagnosis Date  . Arthritis   . Cancer (HCC)    rectal  . Depression   . Diabetes mellitus    "Borderline"  . GERD (gastroesophageal reflux disease)    No weakness  . High cholesterol   . Hypertension   . Neuropathy    "back missed up"  . Rectal cancer (HCC)   . Sleep apnea    Past Surgical History:  Procedure Laterality Date  . BACK SURGERY    . BIOPSY  03/31/2015   Procedure: BIOPSY;  Surgeon: Corbin Ade, MD;  Location: [facility];  Service: Endoscopy;;  . CHOLECYSTECTOMY    . COLONOSCOPY WITH PROPOFOL N/A 03/31/2015   Procedure: COLONOSCOPY WITH PROPOFOL at cecum 0842; withdrawal time=43minutes;  Surgeon: Corbin Ade, MD;  Location: [facility]S;  Service: Endoscopy;  Laterality: N/A;  . COLONOSCOPY WITH PROPOFOL N/A 05/21/2016   Procedure: COLONOSCOPY WITH PROPOFOL;  Surgeon: Corbin Ade, MD;  Location: [facility];  Service: Endoscopy;  Laterality: N/A;  200 - moved to 12:45 - office calling pt with 11:15 arrival time  . COLONOSCOPY WITH PROPOFOL N/A 07/26/2016   Procedure: COLONOSCOPY WITH PROPOFOL;  Surgeon: Corbin Ade, MD;  Location: [facility];  Service: Endoscopy;  Laterality: N/A;  115  . COLONOSCOPY WITH PROPOFOL N/A 07/28/2018   Procedure: COLONOSCOPY WITH PROPOFOL;  Surgeon: Corbin Ade, MD;  Location: [facility];  Service: Endoscopy;  Laterality:  N/A;  1:45pm - unable to reach pt to move up  . FLEXIBLE SIGMOIDOSCOPY N/A 07/05/2015   Procedure: FLEXIBLE SIGMOIDOSCOPY;  Surgeon: Romie Levee, MD;  Location: [facility] END[facility];  Service: Endoscopy;  Laterality: N/A;  with tattoo  . POLYPECTOMY  03/31/2015   Procedure: POLYPECTOMY;  Surgeon: Corbin Ade, MD;  Location: [facility];  Service: Endoscopy;;  . PORTACATH PLACEMENT N/A 08/09/2015   Procedure: INSERTION PORT-A-CATH LEFT SUBCLAVIAN;  Surgeon: Romie Levee, MD;  Location: [facility] OR;  Service: General;  Laterality: N/A;  . XI ROBOTIC ASSISTED LOWER ANTERIOR RESECTION N/A 07/06/2015   Procedure: XI ROBOTIC ASSISTED LOWER ANTERIOR RESECTION, rigid proctoscopy;  Surgeon: Romie Levee, MD;  Location: [facility];  Service: General;  Laterality: N/A;     SOCIAL HISTORY:  Social History   Socioeconomic History  . Marital status: Divorced    Spouse name: Not on file  . Number of children: Not on file  . Years of education: Not on file  . Highest education level: Not on file  Occupational History  . Not on file  Tobacco Use  . Smoking status: Former  Smoker    Packs/day: 0.25    Years: 2.00    Pack years: 0.50    Types: Cigarettes    Quit date: 03/23/2006    Years since quitting: 14.1  . Smokeless tobacco: Never Used  Vaping Use  . Vaping Use: Never used  Substance and Sexual Activity  . Alcohol use: No    Alcohol/week: 0.0 standard drinks  . Drug use: No  . Sexual activity: Not Currently  Other Topics Concern  . Not on file  Social History Narrative  . Not on file   Social Determinants of Health   Financial Resource Strain:   . Difficulty of Paying Living Expenses:   Food Insecurity:   . Worried About Charity fundraiser in the Last Year:   . Arboriculturist in the Last Year:   Transportation Needs:   . Film/video editor (Medical):   Marland Kitchen Lack of Transportation (Non-Medical):   Physical Activity:   . Days of Exercise per Week:   . Minutes of Exercise per Session:   Stress:     . Feeling of Stress :   Social Connections:   . Frequency of Communication with Friends and Family:   . Frequency of Social Gatherings with Friends and Family:   . Attends Religious Services:   . Active Member of Clubs or Organizations:   . Attends Archivist Meetings:   Marland Kitchen Marital Status:   Intimate Partner Violence:   . Fear of Current or Ex-Partner:   . Emotionally Abused:   Marland Kitchen Physically Abused:   . Sexually Abused:     FAMILY HISTORY:  Family History  Problem Relation Age of Onset  . Hypertension Other        "Not sure who, I don't know much about my family"  . Diabetes Other        "Not sure who, I don't know much about my family"  . Colon cancer Neg Hx        "Not that I know of"  . Colon polyps Neg Hx        "not that I know of"    CURRENT MEDICATIONS:  Outpatient Encounter Medications as of 05/04/2020  Medication Sig  . ACCU-CHEK FASTCLIX LANCETS MISC   . ACCU-CHEK SMARTVIEW test strip   . Alcohol Swabs (B-D SINGLE USE SWABS REGULAR) PADS   . amLODipine (NORVASC) 10 MG tablet Take 10 mg by mouth daily.   . diclofenac Sodium (VOLTAREN) 1 % GEL SMARTSIG:4 Gram(s) Topical 3 Times Daily PRN  . gabapentin (NEURONTIN) 800 MG tablet Take 1 tablet (800 mg total) by mouth 3 (three) times daily. (Patient taking differently: Take 800 mg by mouth 2 (two) times daily. )  . lisinopril (PRINIVIL,ZESTRIL) 40 MG tablet Take 1 tablet (40 mg total) by mouth every morning.  . NON FORMULARY PT HAS A C-PAP MACHINE  . nortriptyline (PAMELOR) 75 MG capsule Take 1 capsule (75 mg total) by mouth at bedtime.  Marland Kitchen oxyCODONE-acetaminophen (PERCOCET) 10-325 MG tablet Take 1 tablet by mouth 5 (five) times daily.  Marland Kitchen tiZANidine (ZANAFLEX) 2 MG tablet Take 1 tablet (2 mg total) by mouth every 8 (eight) hours as needed for muscle spasms. (Patient taking differently: Take 2 mg by mouth 2 (two) times daily. )  . Lactulose 20 GM/30ML SOLN Take 30 mLs (20 g total) by mouth at bedtime. Take 62m by  mouth every 3 hours until you have a bowel movement. Then start taking 30 mL at  bedtime. (Patient not taking: Reported on 05/04/2020)  . [DISCONTINUED] prochlorperazine (COMPAZINE) 10 MG tablet Take 1 tablet (10 mg total) by mouth every 6 (six) hours as needed (Nausea or vomiting). (Patient not taking: Reported on 10/06/2015)   No facility-administered encounter medications on file as of 05/04/2020.    ALLERGIES:  No Known Allergies   PHYSICAL EXAM:  ECOG Performance status: 1  Vitals:   05/04/20 0913  BP: 112/65  Pulse: 95  Resp: 19  Temp: (!) 97.3 F (36.3 C)  SpO2: 100%   Filed Weights   05/04/20 0913  Weight: 210 lb (95.3 kg)    Physical Exam Constitutional:      Appearance: Normal appearance. He is normal weight.  Cardiovascular:     Rate and Rhythm: Normal rate and regular rhythm.     Heart sounds: Normal heart sounds.  Pulmonary:     Effort: Pulmonary effort is normal.     Breath sounds: Normal breath sounds.  Abdominal:     General: Bowel sounds are normal.     Palpations: Abdomen is soft.  Musculoskeletal:        General: Normal range of motion.  Skin:    General: Skin is warm.  Neurological:     Mental Status: He is alert and oriented to person, place, and time. Mental status is at baseline.  Psychiatric:        Mood and Affect: Mood normal.        Behavior: Behavior normal.        Thought Content: Thought content normal.        Judgment: Judgment normal.      LABORATORY DATA:  I have reviewed the labs as listed.  CBC    Component Value Date/Time   WBC 3.8 (L) 04/13/2020 1511   RBC 4.28 04/13/2020 1511   HGB 12.3 (L) 04/13/2020 1511   HCT 39.8 04/13/2020 1511   PLT 158 04/13/2020 1511   MCV 93.0 04/13/2020 1511   MCH 28.7 04/13/2020 1511   MCHC 30.9 04/13/2020 1511   RDW 13.2 04/13/2020 1511   LYMPHSABS 0.8 04/13/2020 1511   MONOABS 0.3 04/13/2020 1511   EOSABS 0.4 04/13/2020 1511   BASOSABS 0.0 04/13/2020 1511   CMP Latest Ref Rng &  Units 04/13/2020 12/17/2019 08/26/2019  Glucose 70 - 99 mg/dL 136(H) 105(H) 92  BUN 6 - 20 mg/dL '13 19 11  '$ Creatinine 0.61 - 1.24 mg/dL 1.26(H) 1.29(H) 1.02  Sodium 135 - 145 mmol/L 141 138 138  Potassium 3.5 - 5.1 mmol/L 3.9 4.1 4.0  Chloride 98 - 111 mmol/L 109 105 102  CO2 22 - 32 mmol/L '24 24 26  '$ Calcium 8.9 - 10.3 mg/dL 8.9 9.1 9.3  Total Protein 6.5 - 8.1 g/dL 7.4 7.6 7.6  Total Bilirubin 0.3 - 1.2 mg/dL 0.9 0.8 0.6  Alkaline Phos 38 - 126 U/L 73 71 72  AST 15 - 41 U/L 49(H) 38 33  ALT 0 - 44 U/L 55(H) 60(H) 45(H)    DIAGNOSTIC IMAGING:  I have independently reviewed the CT scans and discussed with the patient.  ASSESSMENT & PLAN:  Rectal cancer ([place]) 1.  Stage III rectal adenocarcinoma: -Status post segmental resection of the rectosigmoid cancer on 07/06/2015, pathology showing 1/14 lymph nodes positive, margins negative, MMR normal. -6 cycles of adjuvant FOLFOX from 08/15/2015 through 11/06/2015 followed by 5-FU and XRT from 12/05/2015 through 01/16/2016 followed by 2 more cycles of FOLFOX from 01/30/2016 through 02/13/2016. -Last colonoscopy on 07/28/2018 showed diverticulosis of  the sigmoid colon and descending colon. -CT CAP on 05/27/2019 showed postoperative findings in the low anterior resection with anastomosis with no evidence of recurrence or metastatic disease in the chest, abdomen or pelvis. -Last CEA was 7.2 in October 2020. -Labs done on 04/13/2020 showed hemoglobin 12.3, WBC 3.8, platelets 158, creatinine 1.26, AST 49, ALT 55.  CEA 5.6 -He denies any smoking history or liver disease. -Patient had CT CAP on 04/21/2020 which showed no evidence of metastatic disease.  Did show degree of colonic distention suggests constipation.  Cecal distention now is near 10 cm without perirectal stranding this is increased from prior scans.  He is now on a bowel regiment. -He will follow-up in 4 months with repeat labs.  2.  Chronic hip pain: -This is from osteoarthritis. -He uses oxycodone as  needed.     Orders placed this encounter:  Orders Placed This Encounter  Procedures  . Lactate dehydrogenase  . CBC with Differential/Platelet  . Comprehensive metabolic panel  . CEA      Francene Finders, FNP-C [place] [phone]

## 2020-05-04 NOTE — Patient Instructions (Signed)
[facility] at [facility] Discharge Instructions  Follow up in 4 months with labs    Thank you for choosing [facility] at [facility] to provide your oncology and hematology care.  To afford each patient quality time with our provider, please arrive at least 15 minutes before your scheduled appointment time.   If you have a lab appointment with the Cancer Center please come in thru the Main Entrance and check in at the main information desk.  You need to re-schedule your appointment should you arrive 10 or more minutes late.  We strive to give you quality time with our providers, and arriving late affects you and other patients whose appointments are after yours.  Also, if you no show three or more times for appointments you may be dismissed from the clinic at the providers discretion.     Again, thank you for choosing [facility].  Our hope is that these requests will decrease the amount of time that you wait before being seen by our physicians.       _____________________________________________________________  Should you have questions after your visit to  Cancer Center, please contact our office at [phone] between the hours of 8:00 a.m. and 4:30 p.m.  Voicemails left after 4:00 p.m. will not be returned until the following business day.  For prescription refill requests, have your pharmacy contact our office and allow 72 hours.    Due to Covid, you will need to wear a mask upon entering the hospital. If you do not have a mask, a mask will be given to you at the Main Entrance upon arrival. For doctor visits, patients may have 1 support person with them. For treatment visits, patients can not have anyone with them due to social distancing guidelines and our immunocompromised population.      

## 2020-05-11 DIAGNOSIS — M792 Neuralgia and neuritis, unspecified: Secondary | ICD-10-CM | POA: Diagnosis not present

## 2020-05-11 DIAGNOSIS — G894 Chronic pain syndrome: Secondary | ICD-10-CM | POA: Diagnosis not present

## 2020-05-14 DIAGNOSIS — E785 Hyperlipidemia, unspecified: Secondary | ICD-10-CM | POA: Diagnosis not present

## 2020-05-14 DIAGNOSIS — I10 Essential (primary) hypertension: Secondary | ICD-10-CM | POA: Diagnosis not present

## 2020-05-31 DIAGNOSIS — G894 Chronic pain syndrome: Secondary | ICD-10-CM | POA: Diagnosis not present

## 2020-05-31 DIAGNOSIS — M25569 Pain in unspecified knee: Secondary | ICD-10-CM | POA: Diagnosis not present

## 2020-05-31 DIAGNOSIS — M47816 Spondylosis without myelopathy or radiculopathy, lumbar region: Secondary | ICD-10-CM | POA: Diagnosis not present

## 2020-05-31 DIAGNOSIS — M79605 Pain in left leg: Secondary | ICD-10-CM | POA: Diagnosis not present

## 2020-06-13 DIAGNOSIS — I10 Essential (primary) hypertension: Secondary | ICD-10-CM | POA: Diagnosis not present

## 2020-06-30 DIAGNOSIS — M79605 Pain in left leg: Secondary | ICD-10-CM | POA: Diagnosis not present

## 2020-06-30 DIAGNOSIS — G894 Chronic pain syndrome: Secondary | ICD-10-CM | POA: Diagnosis not present

## 2020-06-30 DIAGNOSIS — Z79891 Long term (current) use of opiate analgesic: Secondary | ICD-10-CM | POA: Diagnosis not present

## 2020-06-30 DIAGNOSIS — Z79899 Other long term (current) drug therapy: Secondary | ICD-10-CM | POA: Diagnosis not present

## 2020-06-30 DIAGNOSIS — M47816 Spondylosis without myelopathy or radiculopathy, lumbar region: Secondary | ICD-10-CM | POA: Diagnosis not present

## 2020-06-30 DIAGNOSIS — M25569 Pain in unspecified knee: Secondary | ICD-10-CM | POA: Diagnosis not present

## 2020-07-23 DIAGNOSIS — Z743 Need for continuous supervision: Secondary | ICD-10-CM | POA: Diagnosis not present

## 2020-07-23 DIAGNOSIS — R0902 Hypoxemia: Secondary | ICD-10-CM | POA: Diagnosis not present

## 2020-07-23 DIAGNOSIS — W1839XA Other fall on same level, initial encounter: Secondary | ICD-10-CM | POA: Diagnosis not present

## 2020-07-23 DIAGNOSIS — S52592D Other fractures of lower end of left radius, subsequent encounter for closed fracture with routine healing: Secondary | ICD-10-CM | POA: Diagnosis not present

## 2020-07-23 DIAGNOSIS — M25532 Pain in left wrist: Secondary | ICD-10-CM | POA: Diagnosis not present

## 2020-07-23 DIAGNOSIS — S52592A Other fractures of lower end of left radius, initial encounter for closed fracture: Secondary | ICD-10-CM | POA: Diagnosis not present

## 2020-07-23 DIAGNOSIS — M25539 Pain in unspecified wrist: Secondary | ICD-10-CM | POA: Diagnosis not present

## 2020-07-23 DIAGNOSIS — S52502A Unspecified fracture of the lower end of left radius, initial encounter for closed fracture: Secondary | ICD-10-CM | POA: Diagnosis not present

## 2020-07-23 DIAGNOSIS — M79603 Pain in arm, unspecified: Secondary | ICD-10-CM | POA: Diagnosis not present

## 2020-07-28 DIAGNOSIS — M25539 Pain in unspecified wrist: Secondary | ICD-10-CM | POA: Diagnosis not present

## 2020-07-28 DIAGNOSIS — M47816 Spondylosis without myelopathy or radiculopathy, lumbar region: Secondary | ICD-10-CM | POA: Diagnosis not present

## 2020-07-28 DIAGNOSIS — M79605 Pain in left leg: Secondary | ICD-10-CM | POA: Diagnosis not present

## 2020-07-28 DIAGNOSIS — G894 Chronic pain syndrome: Secondary | ICD-10-CM | POA: Diagnosis not present

## 2020-08-24 DIAGNOSIS — G894 Chronic pain syndrome: Secondary | ICD-10-CM | POA: Diagnosis not present

## 2020-08-24 DIAGNOSIS — M961 Postlaminectomy syndrome, not elsewhere classified: Secondary | ICD-10-CM | POA: Diagnosis not present

## 2020-08-24 DIAGNOSIS — M5417 Radiculopathy, lumbosacral region: Secondary | ICD-10-CM | POA: Diagnosis not present

## 2020-08-24 DIAGNOSIS — M5136 Other intervertebral disc degeneration, lumbar region: Secondary | ICD-10-CM | POA: Diagnosis not present

## 2020-08-26 ENCOUNTER — Other Ambulatory Visit (HOSPITAL_COMMUNITY): Payer: Self-pay

## 2020-08-26 DIAGNOSIS — C20 Malignant neoplasm of rectum: Secondary | ICD-10-CM

## 2020-08-29 ENCOUNTER — Inpatient Hospital Stay (HOSPITAL_COMMUNITY): Payer: Medicare Other | Attending: Hematology

## 2020-08-29 ENCOUNTER — Other Ambulatory Visit: Payer: Self-pay

## 2020-08-29 DIAGNOSIS — Z85048 Personal history of other malignant neoplasm of rectum, rectosigmoid junction, and anus: Secondary | ICD-10-CM | POA: Insufficient documentation

## 2020-08-29 DIAGNOSIS — Z923 Personal history of irradiation: Secondary | ICD-10-CM | POA: Insufficient documentation

## 2020-08-29 DIAGNOSIS — Z9221 Personal history of antineoplastic chemotherapy: Secondary | ICD-10-CM | POA: Insufficient documentation

## 2020-08-29 DIAGNOSIS — C20 Malignant neoplasm of rectum: Secondary | ICD-10-CM

## 2020-08-29 DIAGNOSIS — I10 Essential (primary) hypertension: Secondary | ICD-10-CM | POA: Diagnosis not present

## 2020-08-29 DIAGNOSIS — M549 Dorsalgia, unspecified: Secondary | ICD-10-CM | POA: Diagnosis not present

## 2020-08-29 DIAGNOSIS — E1165 Type 2 diabetes mellitus with hyperglycemia: Secondary | ICD-10-CM | POA: Diagnosis not present

## 2020-08-29 DIAGNOSIS — G63 Polyneuropathy in diseases classified elsewhere: Secondary | ICD-10-CM | POA: Diagnosis not present

## 2020-08-29 LAB — COMPREHENSIVE METABOLIC PANEL
ALT: 53 U/L — ABNORMAL HIGH (ref 0–44)
AST: 51 U/L — ABNORMAL HIGH (ref 15–41)
Albumin: 4.4 g/dL (ref 3.5–5.0)
Alkaline Phosphatase: 78 U/L (ref 38–126)
Anion gap: 10 (ref 5–15)
BUN: 10 mg/dL (ref 6–20)
CO2: 24 mmol/L (ref 22–32)
Calcium: 9.5 mg/dL (ref 8.9–10.3)
Chloride: 105 mmol/L (ref 98–111)
Creatinine, Ser: 1.11 mg/dL (ref 0.61–1.24)
GFR, Estimated: >60 mL/min (ref >60)
Glucose, Bld: 86 mg/dL (ref 70–99)
Potassium: 3.7 mmol/L (ref 3.5–5.1)
Sodium: 139 mmol/L (ref 135–145)
Total Bilirubin: 0.9 mg/dL (ref 0.3–1.2)
Total Protein: 7.9 g/dL (ref 6.5–8.1)

## 2020-08-29 LAB — CBC WITH DIFFERENTIAL/PLATELET
Abs Immature Granulocytes: 0.03 10*3/uL (ref 0.00–0.07)
Basophils Absolute: 0 10*3/uL (ref 0.0–0.1)
Basophils Relative: 1 %
Eosinophils Absolute: 0.4 10*3/uL (ref 0.0–0.5)
Eosinophils Relative: 8 %
HCT: 43.4 % (ref 39.0–52.0)
Hemoglobin: 14.1 g/dL (ref 13.0–17.0)
Immature Granulocytes: 1 %
Lymphocytes Relative: 27 %
Lymphs Abs: 1.4 10*3/uL (ref 0.7–4.0)
MCH: 29.2 pg (ref 26.0–34.0)
MCHC: 32.5 g/dL (ref 30.0–36.0)
MCV: 89.9 fL (ref 80.0–100.0)
Monocytes Absolute: 0.5 10*3/uL (ref 0.1–1.0)
Monocytes Relative: 9 %
Neutro Abs: 3 10*3/uL (ref 1.7–7.7)
Neutrophils Relative %: 54 %
Platelets: 192 10*3/uL (ref 150–400)
RBC: 4.83 MIL/uL (ref 4.22–5.81)
RDW: 12.6 % (ref 11.5–15.5)
WBC: 5.4 10*3/uL (ref 4.0–10.5)
nRBC: 0 % (ref 0.0–0.2)

## 2020-08-29 LAB — LACTATE DEHYDROGENASE: LDH: 151 U/L (ref 98–192)

## 2020-08-30 LAB — CEA: CEA: 5 ng/mL — ABNORMAL HIGH (ref 0.0–4.7)

## 2020-09-05 ENCOUNTER — Ambulatory Visit (HOSPITAL_COMMUNITY): Payer: Medicare Other | Admitting: Hematology

## 2020-09-25 NOTE — Progress Notes (Signed)
[name], [place] [address]   CLINIC:  Medical Oncology/Hematology  PCP:  Douglas Fire, MD [place]  [id number] [phone]   REASON FOR VISIT: Follow-up for rectal [name]   CURRENT THERAPY: Clinical surveillance  BRIEF ONCOLOGIC HISTORY:  Oncology History  Rectal [name] ([place])  03/31/2015 Initial Biopsy   Rectum, biopsy, rectal mass - INVASIVE ADENOCARCINOMA   06/02/2015 Tumor Marker   Results for Douglas, [name] (MRN [id number]) as of 08/28/2015 09:09  06/02/2015 16:00 CEA: 4.3    06/08/2015 Imaging   CT abd/pelvis-Rectal primary, without evidence of metastatic disease or acute complication.   06/24/2015 Imaging   CT chest- No specific features identified to suggest metastatic disease to the chest.   07/06/2015 Definitive Surgery   Robotic-assisted lower anterior resrction, rigid proctoscopy [name])- INVASIVE ADENOCARCINOMA, 2 cm, TUMOR INVADES INTO SUBMUCOSA. LVI/PNI (+).  1/14 LN (+). Margins neg.    07/06/2015 Miscellaneous   IHC Tumor Expression Results normal.  Negative for MLH1, MSH2, MSH6, & PMS2.    07/06/2015 Pathologic Stage   pT1, pN1a, pMx   08/15/2015 - 11/16/2015 Chemotherapy   FOLFOX x 6 cycles (adjuvant chemo "phase 1")   09/26/2015 Treatment Plan Change   Treatment held today.  5FU bolus D/C'd.  Neulasta OnPro added to each subsequent cycles of therapy.   10/04/2015 Treatment Plan Change   Neulasta onpro added to all subsequent cycles of treatment.   10/18/2015 Treatment Plan Change   Tx held for thrombocytopenia.  5FU CI is reduced by 20% for subsequent treatments.   12/05/2015 - 01/16/2016 Chemotherapy   5FU continuous infusion weekly x 6 cycles; concurrent radiation. (adjuvant chemo "phase 2")   12/05/2015 - 01/18/2016 Radiation Therapy   Pelvic radiation completed in [name]).  Pelvis 45 Gy in 25 fractions.  Then cone down boost treatment for additional 5.4 Gy in 8 fractions.  Total  dose: 50.4 Gy in 33 treatments   01/30/2016 - 02/13/2016 Chemotherapy   FOLFOX x 2 cycles to complete 6 months of adjuvant chemotherapy.  (adjuvant chemo "phase 3")   05/21/2016 Procedure   Colonoscopy by Dr. Gala [name]- The procedure was aborted due to inadequate bowel prep. Patient readily admits to eating at least applesauce yesterday which was in conflict with our written and verbal preparation instructions. No specimens collected.   06/19/2016 Imaging   CT abd/pelvis- Postsurgical changes of the rectum. New presacral soft tissue thickening likely post treatment related. Recommend attention on followup. Large amount of stool in the colon compatible with constipation. No evidence for distant metastatic dz.    Imaging   CT abd/pelvis: IMPRESSION: Stable exam. No evidence of recurrent for metastatic carcinoma within the abdomen or pelvis.  Mild decrease in large colonic stool burden since prior study. No acute findings     [name] STAGING: [name] Staging Rectal [name] [facility]) Staging form: Colon and Rectum, AJCC 7th Edition - Pathologic stage from 09/12/2015: Stage IIIA (T1, N1a, cM0) - Signed by Douglas Cancer, PA-C on 09/25/2015 - Clinical stage from 09/26/2015: Stage IIIA (T1, N1a, M0) - Signed by Douglas Cancer, PA-C on 09/25/2015    INTERVAL HISTORY:  [name] 59 y.o. male returns for routine follow-up for rectal [name].  Patient reports he has been doing well since last visit.  He recently stopped taking his stool softener secondary to frequent loose stools requiring him to take an antidiarrheal which was causing constipation.  He has now self regulated his bowels and  is not taking any medication.  He denies any abdominal pain or bloating. He denies any bright red bleeding per rectum or melena.  He denies any easy bruising or bleeding.  He denies any change in stool form. Denies any nausea, vomiting, or diarrhea. Denies any new pains. Had not noticed any recent bleeding such as  epistaxis, hematuria or hematochezia. Denies recent chest pain on exertion, shortness of breath on minimal exertion, pre-syncopal episodes, or palpitations. Denies any numbness or tingling in hands or feet. Denies any recent fevers, infections, or recent hospitalizations. Patient reports appetite at 100% and energy level at 100%.  He is eating well maintain his weight at this time.   REVIEW OF SYSTEMS:  Review of Systems  Constitutional: Negative.  Negative for appetite change, chills, fatigue and fever.  HENT:  Negative.  Negative for hearing loss, lump/mass, mouth sores and nosebleeds.   Eyes: Negative.  Negative for eye problems.  Respiratory: Negative for cough, hemoptysis and shortness of breath.   Cardiovascular: Negative.  Negative for chest pain and leg swelling.  Gastrointestinal: Negative.  Negative for abdominal pain, blood in stool, constipation, diarrhea, nausea and vomiting.  Endocrine: Negative.  Negative for hot flashes.  Genitourinary: Negative.  Negative for bladder incontinence, difficulty urinating, dysuria, frequency and hematuria.   Musculoskeletal: Negative.  Negative for back pain, flank pain, gait problem and myalgias.  Skin: Negative.  Negative for itching and rash.  Neurological: Negative.  Negative for dizziness, gait problem, headaches, light-headedness and numbness.  Hematological: Negative.  Negative for adenopathy.  Psychiatric/Behavioral: Negative for confusion. The patient is not nervous/anxious.      PAST MEDICAL/SURGICAL HISTORY:  Past Medical History:  Diagnosis Date  . Arthritis   . [name] ([place])    rectal  . Depression   . Diabetes mellitus    "Borderline"  . GERD (gastroesophageal reflux disease)    No weakness  . High cholesterol   . Hypertension   . Neuropathy    "back missed up"  . Rectal [name] ([place])   . Sleep apnea    Past Surgical History:  Procedure Laterality Date  . BACK SURGERY    . BIOPSY  03/31/2015   Procedure: BIOPSY;   Surgeon: Douglas Dolin, MD;  Location: [facility];  Service: Endoscopy;;  . CHOLECYSTECTOMY    . COLONOSCOPY WITH PROPOFOL N/A 03/31/2015   Procedure: COLONOSCOPY WITH PROPOFOL at cecum 0842; withdrawal time=48minutes;  Surgeon: Douglas Dolin, MD;  Location: [facility];  Service: Endoscopy;  Laterality: N/A;  . COLONOSCOPY WITH PROPOFOL N/A 05/21/2016   Procedure: COLONOSCOPY WITH PROPOFOL;  Surgeon: Douglas Dolin, MD;  [facility];  Service: Endoscopy;  Laterality: N/A;  200 - moved to 12:45 - office calling pt with 11:15 arrival time  . COLONOSCOPY WITH PROPOFOL N/A 07/26/2016   Procedure: COLONOSCOPY WITH PROPOFOL;  Surgeon: Douglas Dolin, MD;  [facility];  Service: Endoscopy;  Laterality: N/A;  115  . COLONOSCOPY WITH PROPOFOL N/A 07/28/2018   Procedure: COLONOSCOPY WITH PROPOFOL;  Surgeon: Douglas Dolin, MD;  [facility]: [facility];  Service: Endoscopy;  Laterality: N/A;  1:45pm - unable to reach pt to move up  . FLEXIBLE SIGMOIDOSCOPY N/A 07/05/2015   Procedure: FLEXIBLE SIGMOIDOSCOPY;  Surgeon: [name], MD;  Location: [facility];  Service: Endoscopy;  Laterality: N/A;  with tattoo  . POLYPECTOMY  03/31/2015   Procedure: POLYPECTOMY;  Surgeon: Douglas Dolin, MD;  Location: [facility];  Service: Endoscopy;;  . Sol Passer  PLACEMENT N/A 08/09/2015   Procedure: INSERTION PORT-A-CATH LEFT SUBCLAVIAN;  Surgeon: [name], MD;  Location: [facility];  Service: General;  Laterality: N/A;  . XI ROBOTIC ASSISTED LOWER ANTERIOR RESECTION N/A 07/06/2015   Procedure: XI ROBOTIC ASSISTED LOWER ANTERIOR RESECTION, rigid proctoscopy;  Surgeon: [name], MD;  Location: [facility];  Service: General;  Laterality: N/A;     SOCIAL HISTORY:  Social History   Socioeconomic History  . Marital status: Divorced    Spouse name: Not on file  . Number of children: Not on file  . Years of education: Not on file  . Highest education level: Not on file  Occupational History  . Not on file    Tobacco Use  . Smoking status: Former Smoker    Packs/day: 0.25    Years: 2.00    Pack years: 0.50    Types: Cigarettes    Quit date: 03/23/2006    Years since quitting: 14.5  . Smokeless tobacco: Never Used  Vaping Use  . Vaping Use: Never used  Substance and Sexual Activity  . Alcohol use: No    Alcohol/week: 0.0 standard drinks  . Drug use: No  . Sexual activity: Not Currently  Other Topics Concern  . Not on file  Social History Narrative  . Not on file   Social Determinants of Health   Financial Resource Strain: Low Risk   . Difficulty of Paying Living Expenses: Not hard at all  Food Insecurity: No Food Insecurity  . Worried About Charity fundraiser in the Last Year: Never true  . Ran Out of Food in the Last Year: Never true  Transportation Needs: No Transportation Needs  . Lack of Transportation (Medical): No  . Lack of Transportation (Non-Medical): No  Physical Activity: Inactive  . Days of Exercise per Week: 0 days  . Minutes of Exercise per Session: 0 min  Stress: No Stress Concern Present  . Feeling of Stress : Not at all  Social Connections: Socially Isolated  . Frequency of Communication with Friends and Family: More than three times a week  . Frequency of Social Gatherings with Friends and Family: More than three times a week  . Attends Religious Services: Never  . Active Member of Clubs or Organizations: No  . Attends Archivist Meetings: Never  . Marital Status: Divorced  Human resources officer Violence: Not At Risk  . Fear of Current or Ex-Partner: No  . Emotionally Abused: No  . Physically Abused: No  . Sexually Abused: No    FAMILY HISTORY:  Family History  Problem Relation Age of Onset  . Hypertension Other        "Not sure who, I don't know much about my family"  . Diabetes Other        "Not sure who, I don't know much about my family"  . Colon [name] Neg Hx        "Not that I know of"  . Colon polyps Neg Hx        "not that I know  of"    CURRENT MEDICATIONS:  Outpatient Encounter Medications as of 09/26/2020  Medication Sig  . ACCU-CHEK FASTCLIX LANCETS MISC   . ACCU-CHEK SMARTVIEW test strip   . Alcohol Swabs (B-D SINGLE USE SWABS REGULAR) PADS   . amLODipine (NORVASC) 10 MG tablet Take 10 mg by mouth daily.   . diclofenac Sodium (VOLTAREN) 1 % GEL SMARTSIG:4 Gram(s) Topical 3 Times Daily PRN  . gabapentin (NEURONTIN) 800  MG tablet Take 1 tablet (800 mg total) by mouth 3 (three) times daily. (Patient taking differently: Take 800 mg by mouth 2 (two) times daily. )  . Lactulose 20 GM/30ML SOLN Take 30 mLs (20 g total) by mouth at bedtime. Take 62mL by mouth every 3 hours until you have a bowel movement. Then start taking 30 mL at bedtime. (Patient not taking: Reported on 05/04/2020)  . lisinopril (PRINIVIL,ZESTRIL) 40 MG tablet Take 1 tablet (40 mg total) by mouth every morning.  . NON FORMULARY PT HAS A C-PAP MACHINE  . nortriptyline (PAMELOR) 75 MG capsule Take 1 capsule (75 mg total) by mouth at bedtime.  Marland Kitchen oxyCODONE-acetaminophen (PERCOCET) 10-325 MG tablet Take 1 tablet by mouth 5 (five) times daily.  Marland Kitchen tiZANidine (ZANAFLEX) 2 MG tablet Take 1 tablet (2 mg total) by mouth every 8 (eight) hours as needed for muscle spasms. (Patient taking differently: Take 2 mg by mouth 2 (two) times daily. )  . [DISCONTINUED] lisinopril (ZESTRIL) 40 MG tablet daily.  . [DISCONTINUED] prochlorperazine (COMPAZINE) 10 MG tablet Take 1 tablet (10 mg total) by mouth every 6 (six) hours as needed (Nausea or vomiting). (Patient not taking: Reported on 10/06/2015)   No facility-administered encounter medications on file as of 09/26/2020.    ALLERGIES:  No Known Allergies   PHYSICAL EXAM:  ECOG Performance status: 1  Vitals:   09/26/20 0941  BP: 113/68  Pulse: 91  Resp: 20  Temp: (!) 97.2 F (36.2 C)  SpO2: 94%   Filed Weights   09/26/20 0941  Weight: 201 lb (91.2 kg)    Physical Exam Constitutional:      Appearance:  Normal appearance. He is normal weight.  Cardiovascular:     Rate and Rhythm: Normal rate and regular rhythm.     Heart sounds: Normal heart sounds.  Pulmonary:     Effort: Pulmonary effort is normal.     Breath sounds: Normal breath sounds.  Abdominal:     General: Bowel sounds are normal.     Palpations: Abdomen is soft.  Musculoskeletal:        General: Normal range of motion.  Skin:    General: Skin is warm.  Neurological:     Mental Status: He is alert and oriented to person, place, and time. Mental status is at baseline.  Psychiatric:        Mood and Affect: Mood normal.        Behavior: Behavior normal.        Thought Content: Thought content normal.        Judgment: Judgment normal.      LABORATORY DATA:  I have reviewed the labs as listed.  CBC    Component Value Date/Time   WBC 5.4 08/29/2020 1303   RBC 4.83 08/29/2020 1303   HGB 14.1 08/29/2020 1303   HCT 43.4 08/29/2020 1303   PLT 192 08/29/2020 1303   MCV 89.9 08/29/2020 1303   MCH 29.2 08/29/2020 1303   MCHC 32.5 08/29/2020 1303   RDW 12.6 08/29/2020 1303   LYMPHSABS 1.4 08/29/2020 1303   MONOABS 0.5 08/29/2020 1303   EOSABS 0.4 08/29/2020 1303   BASOSABS 0.0 08/29/2020 1303   CMP Latest Ref Rng & Units 08/29/2020 04/13/2020 12/17/2019  Glucose 70 - 99 mg/dL 86 136(H) 105(H)  BUN 6 - 20 mg/dL $Remove'10 13 19  'tIWhSQF$ Creatinine 0.61 - 1.24 mg/dL 1.11 1.26(H) 1.29(H)  Sodium 135 - 145 mmol/L 139 141 138  Potassium 3.5 - 5.1 mmol/L 3.7  3.9 4.1  Chloride 98 - 111 mmol/L 105 109 105  CO2 22 - 32 mmol/L $RemoveB'24 24 24  'QpowjOZI$ Calcium 8.9 - 10.3 mg/dL 9.5 8.9 9.1  Total Protein 6.5 - 8.1 g/dL 7.9 7.4 7.6  Total Bilirubin 0.3 - 1.2 mg/dL 0.9 0.9 0.8  Alkaline Phos 38 - 126 U/L 78 73 71  AST 15 - 41 U/L 51(H) 49(H) 38  ALT 0 - 44 U/L 53(H) 55(H) 60(H)    DIAGNOSTIC IMAGING:  I have independently reviewed the CT scans and discussed with the patient.  ASSESSMENT & PLAN:  1.  Stage III rectal adenocarcinoma: -Status post  segmental resection of the rectosigmoid [name] on 07/06/2015, pathology showing 1/14 lymph nodes positive, margins negative, MMR normal. -6 cycles of adjuvant FOLFOX from 08/15/2015 through 11/06/2015 followed by 5-FU and XRT from 12/05/2015 through 01/16/2016 followed by 2 more cycles of FOLFOX from 01/30/2016 through 02/13/2016. -Last colonoscopy on 07/28/2018 showed diverticulosis of the sigmoid colon and descending colon. -CT CAP on 05/27/2019 showed postoperative findings in the low anterior resection with anastomosis with no evidence of recurrence or metastatic disease in the chest, abdomen or pelvis. -Last CEA was 7.2 in October 2020. -Labs done on 08/29/20 showed hemoglobin 14.1, WBC 5.4,  platelets 192, creatinine 1.11, AST 51, ALT 53.  CEA 5.0 -He denies any smoking history or liver disease. -Patient had CT CAP on 04/21/2020 which showed no evidence of metastatic disease.  Did show degree of colonic distention suggests constipation.  Cecal distention now is near 10 cm without perirectal stranding this is increased from prior scans.  He is now on a bowel regiment. -He will follow-up in 4 months with repeat labs.  2.  Chronic hip pain: -This is from osteoarthritis. -He uses oxycodone as needed.  Disposition: Return to clinic in 6 months for repeat lab work and assessment.   No problem-specific Assessment & Plan notes found for this encounter.  Greater than 50% was spent in counseling and coordination of care with this patient including but not limited to discussion of the relevant topics above (See A&P) including, but not limited to diagnosis and management of acute and chronic medical conditions.     Orders placed this encounter:  No orders of the defined types were placed in this encounter.  Faythe Casa, NP 09/26/2020 4:02 PM  [place] [phone]

## 2020-09-26 ENCOUNTER — Encounter (HOSPITAL_COMMUNITY): Payer: Self-pay | Admitting: Oncology

## 2020-09-26 ENCOUNTER — Inpatient Hospital Stay (HOSPITAL_COMMUNITY): Payer: Medicare Other | Attending: Hematology | Admitting: Oncology

## 2020-09-26 ENCOUNTER — Inpatient Hospital Stay (HOSPITAL_COMMUNITY): Payer: Medicare Other

## 2020-09-26 ENCOUNTER — Other Ambulatory Visit: Payer: Self-pay

## 2020-09-26 VITALS — BP 113/68 | HR 91 | Temp 97.2°F | Resp 20 | Wt 201.0 lb

## 2020-09-26 DIAGNOSIS — K219 Gastro-esophageal reflux disease without esophagitis: Secondary | ICD-10-CM | POA: Insufficient documentation

## 2020-09-26 DIAGNOSIS — Z9221 Personal history of antineoplastic chemotherapy: Secondary | ICD-10-CM | POA: Insufficient documentation

## 2020-09-26 DIAGNOSIS — Z87891 Personal history of nicotine dependence: Secondary | ICD-10-CM | POA: Insufficient documentation

## 2020-09-26 DIAGNOSIS — F329 Major depressive disorder, single episode, unspecified: Secondary | ICD-10-CM | POA: Diagnosis not present

## 2020-09-26 DIAGNOSIS — Z923 Personal history of irradiation: Secondary | ICD-10-CM | POA: Diagnosis not present

## 2020-09-26 DIAGNOSIS — Z79899 Other long term (current) drug therapy: Secondary | ICD-10-CM | POA: Insufficient documentation

## 2020-09-26 DIAGNOSIS — M1990 Unspecified osteoarthritis, unspecified site: Secondary | ICD-10-CM | POA: Diagnosis not present

## 2020-09-26 DIAGNOSIS — Z85048 Personal history of other malignant neoplasm of rectum, rectosigmoid junction, and anus: Secondary | ICD-10-CM | POA: Diagnosis not present

## 2020-09-26 DIAGNOSIS — C20 Malignant neoplasm of rectum: Secondary | ICD-10-CM | POA: Diagnosis not present

## 2020-09-26 DIAGNOSIS — Z452 Encounter for adjustment and management of vascular access device: Secondary | ICD-10-CM | POA: Diagnosis not present

## 2020-09-26 DIAGNOSIS — E119 Type 2 diabetes mellitus without complications: Secondary | ICD-10-CM | POA: Insufficient documentation

## 2020-09-26 DIAGNOSIS — I10 Essential (primary) hypertension: Secondary | ICD-10-CM | POA: Diagnosis not present

## 2020-09-26 DIAGNOSIS — E7800 Pure hypercholesterolemia, unspecified: Secondary | ICD-10-CM | POA: Insufficient documentation

## 2020-09-26 MED ORDER — SODIUM CHLORIDE 0.9% FLUSH
10.0000 mL | Freq: Once | INTRAVENOUS | Status: AC
  Administered 2020-09-26: 10 mL via INTRAVENOUS

## 2020-09-26 MED ORDER — HEPARIN SOD (PORK) LOCK FLUSH 100 UNIT/ML IV SOLN
500.0000 [IU] | Freq: Once | INTRAVENOUS | Status: AC
  Administered 2020-09-26: 500 [IU] via INTRAVENOUS

## 2020-09-26 NOTE — Patient Instructions (Signed)
[facility] at [facility] Discharge Instructions  You saw Jenny Burns, NP, today.   Thank you for choosing [facility] at [facility] to provide your oncology and hematology care.  To afford each patient quality time with our provider, please arrive at least 15 minutes before your scheduled appointment time.   If you have a lab appointment with the Cancer Center please come in thru the Main Entrance and check in at the main information desk.  You need to re-schedule your appointment should you arrive 10 or more minutes late.  We strive to give you quality time with our providers, and arriving late affects you and other patients whose appointments are after yours.  Also, if you no show three or more times for appointments you may be dismissed from the clinic at the providers discretion.     Again, thank you for choosing [facility].  Our hope is that these requests will decrease the amount of time that you wait before being seen by our physicians.       _____________________________________________________________  Should you have questions after your visit to  Cancer Center, please contact our office at [phone] and follow the prompts.  Our office hours are 8:00 a.m. and 4:30 p.m. Monday - Friday.  Please note that voicemails left after 4:00 p.m. may not be returned until the following business day.  We are closed weekends and major holidays.  You do have access to a nurse 24-7, just call the main number to the clinic [phone] and do not press any options, hold on the line and a nurse will answer the phone.    For prescription refill requests, have your pharmacy contact our office and allow 72 hours.    Due to Covid, you will need to wear a mask upon entering the hospital. If you do not have a mask, a mask will be given to you at the Main Entrance upon arrival. For doctor visits, patients may have 1 support person age 18  or older with them. For treatment visits, patients can not have anyone with them due to social distancing guidelines and our immunocompromised population.     

## 2020-09-26 NOTE — Progress Notes (Signed)
Patients port flushed without difficulty.  Good blood return noted with no bruising or swelling noted at site.  Band aid applied.  VSS with discharge and left ambulatory with no s/s of distress noted.  

## 2020-09-29 DIAGNOSIS — Z79899 Other long term (current) drug therapy: Secondary | ICD-10-CM | POA: Diagnosis not present

## 2020-09-29 DIAGNOSIS — E785 Hyperlipidemia, unspecified: Secondary | ICD-10-CM | POA: Diagnosis not present

## 2020-09-29 DIAGNOSIS — Z79891 Long term (current) use of opiate analgesic: Secondary | ICD-10-CM | POA: Diagnosis not present

## 2020-09-29 DIAGNOSIS — M25569 Pain in unspecified knee: Secondary | ICD-10-CM | POA: Diagnosis not present

## 2020-09-29 DIAGNOSIS — I10 Essential (primary) hypertension: Secondary | ICD-10-CM | POA: Diagnosis not present

## 2020-09-29 DIAGNOSIS — M79605 Pain in left leg: Secondary | ICD-10-CM | POA: Diagnosis not present

## 2020-09-29 DIAGNOSIS — M47816 Spondylosis without myelopathy or radiculopathy, lumbar region: Secondary | ICD-10-CM | POA: Diagnosis not present

## 2020-09-29 DIAGNOSIS — G894 Chronic pain syndrome: Secondary | ICD-10-CM | POA: Diagnosis not present

## 2020-10-27 DIAGNOSIS — M47816 Spondylosis without myelopathy or radiculopathy, lumbar region: Secondary | ICD-10-CM | POA: Diagnosis not present

## 2020-10-27 DIAGNOSIS — M25569 Pain in unspecified knee: Secondary | ICD-10-CM | POA: Diagnosis not present

## 2020-10-27 DIAGNOSIS — G894 Chronic pain syndrome: Secondary | ICD-10-CM | POA: Diagnosis not present

## 2020-10-27 DIAGNOSIS — M79605 Pain in left leg: Secondary | ICD-10-CM | POA: Diagnosis not present

## 2020-10-29 DIAGNOSIS — C20 Malignant neoplasm of rectum: Secondary | ICD-10-CM | POA: Diagnosis not present

## 2020-10-29 DIAGNOSIS — I10 Essential (primary) hypertension: Secondary | ICD-10-CM | POA: Diagnosis not present

## 2020-11-24 DIAGNOSIS — Z79899 Other long term (current) drug therapy: Secondary | ICD-10-CM | POA: Diagnosis not present

## 2020-11-24 DIAGNOSIS — M47816 Spondylosis without myelopathy or radiculopathy, lumbar region: Secondary | ICD-10-CM | POA: Diagnosis not present

## 2020-11-24 DIAGNOSIS — G894 Chronic pain syndrome: Secondary | ICD-10-CM | POA: Diagnosis not present

## 2020-11-24 DIAGNOSIS — Z79891 Long term (current) use of opiate analgesic: Secondary | ICD-10-CM | POA: Diagnosis not present

## 2020-11-24 DIAGNOSIS — M25569 Pain in unspecified knee: Secondary | ICD-10-CM | POA: Diagnosis not present

## 2020-11-24 DIAGNOSIS — M79605 Pain in left leg: Secondary | ICD-10-CM | POA: Diagnosis not present

## 2020-11-29 DIAGNOSIS — I10 Essential (primary) hypertension: Secondary | ICD-10-CM | POA: Diagnosis not present

## 2020-12-21 DIAGNOSIS — M5416 Radiculopathy, lumbar region: Secondary | ICD-10-CM | POA: Diagnosis not present

## 2020-12-21 DIAGNOSIS — M5417 Radiculopathy, lumbosacral region: Secondary | ICD-10-CM | POA: Diagnosis not present

## 2020-12-27 ENCOUNTER — Inpatient Hospital Stay (HOSPITAL_COMMUNITY): Payer: Medicare Other | Attending: Hematology

## 2020-12-27 ENCOUNTER — Other Ambulatory Visit: Payer: Self-pay

## 2020-12-27 DIAGNOSIS — Z85048 Personal history of other malignant neoplasm of rectum, rectosigmoid junction, and anus: Secondary | ICD-10-CM | POA: Diagnosis not present

## 2020-12-27 DIAGNOSIS — Z452 Encounter for adjustment and management of vascular access device: Secondary | ICD-10-CM | POA: Insufficient documentation

## 2020-12-27 DIAGNOSIS — Z87891 Personal history of nicotine dependence: Secondary | ICD-10-CM | POA: Diagnosis not present

## 2020-12-27 MED ORDER — HEPARIN SOD (PORK) LOCK FLUSH 100 UNIT/ML IV SOLN
500.0000 [IU] | Freq: Once | INTRAVENOUS | Status: AC
  Administered 2020-12-27: 500 [IU] via INTRAVENOUS

## 2020-12-27 MED ORDER — SODIUM CHLORIDE 0.9% FLUSH
10.0000 mL | INTRAVENOUS | Status: DC | PRN
  Administered 2020-12-27: 10 mL via INTRAVENOUS

## 2020-12-27 NOTE — Progress Notes (Signed)
Patient presented today for port flush.  Port accessed and blood returned noted, flushed without difficulty.  Patient tolerated well.  Discharged ambulatory and in stable condition.

## 2020-12-30 DIAGNOSIS — G4733 Obstructive sleep apnea (adult) (pediatric): Secondary | ICD-10-CM | POA: Diagnosis not present

## 2020-12-30 DIAGNOSIS — I10 Essential (primary) hypertension: Secondary | ICD-10-CM | POA: Diagnosis not present

## 2021-01-18 DIAGNOSIS — M79643 Pain in unspecified hand: Secondary | ICD-10-CM | POA: Diagnosis not present

## 2021-01-18 DIAGNOSIS — M961 Postlaminectomy syndrome, not elsewhere classified: Secondary | ICD-10-CM | POA: Diagnosis not present

## 2021-01-18 DIAGNOSIS — M5136 Other intervertebral disc degeneration, lumbar region: Secondary | ICD-10-CM | POA: Diagnosis not present

## 2021-01-18 DIAGNOSIS — G894 Chronic pain syndrome: Secondary | ICD-10-CM | POA: Diagnosis not present

## 2021-01-27 DIAGNOSIS — G4733 Obstructive sleep apnea (adult) (pediatric): Secondary | ICD-10-CM | POA: Diagnosis not present

## 2021-01-27 DIAGNOSIS — I10 Essential (primary) hypertension: Secondary | ICD-10-CM | POA: Diagnosis not present

## 2021-02-15 DIAGNOSIS — M961 Postlaminectomy syndrome, not elsewhere classified: Secondary | ICD-10-CM | POA: Diagnosis not present

## 2021-02-15 DIAGNOSIS — M5417 Radiculopathy, lumbosacral region: Secondary | ICD-10-CM | POA: Diagnosis not present

## 2021-02-15 DIAGNOSIS — G894 Chronic pain syndrome: Secondary | ICD-10-CM | POA: Diagnosis not present

## 2021-02-15 DIAGNOSIS — M5136 Other intervertebral disc degeneration, lumbar region: Secondary | ICD-10-CM | POA: Diagnosis not present

## 2021-02-17 ENCOUNTER — Other Ambulatory Visit (HOSPITAL_COMMUNITY): Payer: Self-pay | Admitting: Physician Assistant

## 2021-02-17 ENCOUNTER — Ambulatory Visit (HOSPITAL_COMMUNITY)
Admission: RE | Admit: 2021-02-17 | Discharge: 2021-02-17 | Disposition: A | Discharge status: Home / Self Care | Payer: Medicare Other | Source: Ambulatory Visit | Attending: Physician Assistant | Admitting: Physician Assistant

## 2021-02-17 ENCOUNTER — Other Ambulatory Visit: Payer: Self-pay

## 2021-02-17 DIAGNOSIS — M25569 Pain in unspecified knee: Secondary | ICD-10-CM | POA: Diagnosis not present

## 2021-02-17 DIAGNOSIS — M25561 Pain in right knee: Secondary | ICD-10-CM | POA: Diagnosis not present

## 2021-02-28 ENCOUNTER — Other Ambulatory Visit: Payer: Self-pay

## 2021-02-28 ENCOUNTER — Other Ambulatory Visit (HOSPITAL_COMMUNITY): Payer: Self-pay

## 2021-02-28 ENCOUNTER — Inpatient Hospital Stay (HOSPITAL_COMMUNITY): Payer: Medicare Other | Attending: Hematology

## 2021-02-28 DIAGNOSIS — I10 Essential (primary) hypertension: Secondary | ICD-10-CM | POA: Diagnosis not present

## 2021-02-28 DIAGNOSIS — Z79899 Other long term (current) drug therapy: Secondary | ICD-10-CM | POA: Insufficient documentation

## 2021-02-28 DIAGNOSIS — Z0001 Encounter for general adult medical examination with abnormal findings: Secondary | ICD-10-CM | POA: Diagnosis not present

## 2021-02-28 DIAGNOSIS — Z87891 Personal history of nicotine dependence: Secondary | ICD-10-CM | POA: Diagnosis not present

## 2021-02-28 DIAGNOSIS — M1611 Unilateral primary osteoarthritis, right hip: Secondary | ICD-10-CM | POA: Diagnosis not present

## 2021-02-28 DIAGNOSIS — Z923 Personal history of irradiation: Secondary | ICD-10-CM | POA: Insufficient documentation

## 2021-02-28 DIAGNOSIS — G8929 Other chronic pain: Secondary | ICD-10-CM | POA: Diagnosis not present

## 2021-02-28 DIAGNOSIS — Z9049 Acquired absence of other specified parts of digestive tract: Secondary | ICD-10-CM | POA: Insufficient documentation

## 2021-02-28 DIAGNOSIS — Z1389 Encounter for screening for other disorder: Secondary | ICD-10-CM | POA: Diagnosis not present

## 2021-02-28 DIAGNOSIS — C20 Malignant neoplasm of rectum: Secondary | ICD-10-CM

## 2021-02-28 DIAGNOSIS — Z85048 Personal history of other malignant neoplasm of rectum, rectosigmoid junction, and anus: Secondary | ICD-10-CM | POA: Insufficient documentation

## 2021-02-28 LAB — COMPREHENSIVE METABOLIC PANEL
ALT: 63 U/L — ABNORMAL HIGH (ref 0–44)
AST: 54 U/L — ABNORMAL HIGH (ref 15–41)
Albumin: 4.2 g/dL (ref 3.5–5.0)
Alkaline Phosphatase: 68 U/L (ref 38–126)
Anion gap: 11 (ref 5–15)
BUN: 10 mg/dL (ref 6–20)
CO2: 24 mmol/L (ref 22–32)
Calcium: 9 mg/dL (ref 8.9–10.3)
Chloride: 103 mmol/L (ref 98–111)
Creatinine, Ser: 1.27 mg/dL — ABNORMAL HIGH (ref 0.61–1.24)
GFR, Estimated: >60 mL/min (ref >60)
Glucose, Bld: 106 mg/dL — ABNORMAL HIGH (ref 70–99)
Potassium: 3.6 mmol/L (ref 3.5–5.1)
Sodium: 138 mmol/L (ref 135–145)
Total Bilirubin: 0.8 mg/dL (ref 0.3–1.2)
Total Protein: 7.9 g/dL (ref 6.5–8.1)

## 2021-02-28 LAB — CBC WITH DIFFERENTIAL/PLATELET
Abs Immature Granulocytes: 0.01 10*3/uL (ref 0.00–0.07)
Basophils Absolute: 0 10*3/uL (ref 0.0–0.1)
Basophils Relative: 1 %
Eosinophils Absolute: 0.3 10*3/uL (ref 0.0–0.5)
Eosinophils Relative: 7 %
HCT: 41.5 % (ref 39.0–52.0)
Hemoglobin: 13.2 g/dL (ref 13.0–17.0)
Immature Granulocytes: 0 %
Lymphocytes Relative: 25 %
Lymphs Abs: 1.1 10*3/uL (ref 0.7–4.0)
MCH: 29.7 pg (ref 26.0–34.0)
MCHC: 31.8 g/dL (ref 30.0–36.0)
MCV: 93.3 fL (ref 80.0–100.0)
Monocytes Absolute: 0.5 10*3/uL (ref 0.1–1.0)
Monocytes Relative: 11 %
Neutro Abs: 2.6 10*3/uL (ref 1.7–7.7)
Neutrophils Relative %: 56 %
Platelets: 190 10*3/uL (ref 150–400)
RBC: 4.45 MIL/uL (ref 4.22–5.81)
RDW: 12.8 % (ref 11.5–15.5)
WBC: 4.5 10*3/uL (ref 4.0–10.5)
nRBC: 0 % (ref 0.0–0.2)

## 2021-02-28 LAB — LACTATE DEHYDROGENASE: LDH: 140 U/L (ref 98–192)

## 2021-02-28 MED ORDER — SODIUM CHLORIDE 0.9% FLUSH
10.0000 mL | INTRAVENOUS | Status: DC | PRN
  Administered 2021-02-28: 10 mL via INTRAVENOUS

## 2021-02-28 MED ORDER — HEPARIN SOD (PORK) LOCK FLUSH 100 UNIT/ML IV SOLN
500.0000 [IU] | Freq: Once | INTRAVENOUS | Status: AC
  Administered 2021-02-28: 500 [IU] via INTRAVENOUS

## 2021-02-28 NOTE — Progress Notes (Signed)
Patients port flushed without difficulty.  Good blood return noted with no bruising or swelling noted at site.  Band aid applied.  VSS with discharge and left in satisfactory condition with no s/s of distress noted.   

## 2021-03-01 LAB — CEA: CEA: 4.2 ng/mL (ref 0.0–4.7)

## 2021-03-06 ENCOUNTER — Ambulatory Visit (HOSPITAL_COMMUNITY): Payer: Medicare Other | Admitting: Hematology

## 2021-03-06 IMAGING — DX THORACIC SPINE 2 VIEWS
3 series · 3 of 3 positions shown · non-contrast
Comparison: CT 11/06/2018

CLINICAL DATA: 58-year-old male with a history of back pain

EXAM:
THORACIC SPINE 2 VIEWS

[t-spine lat]
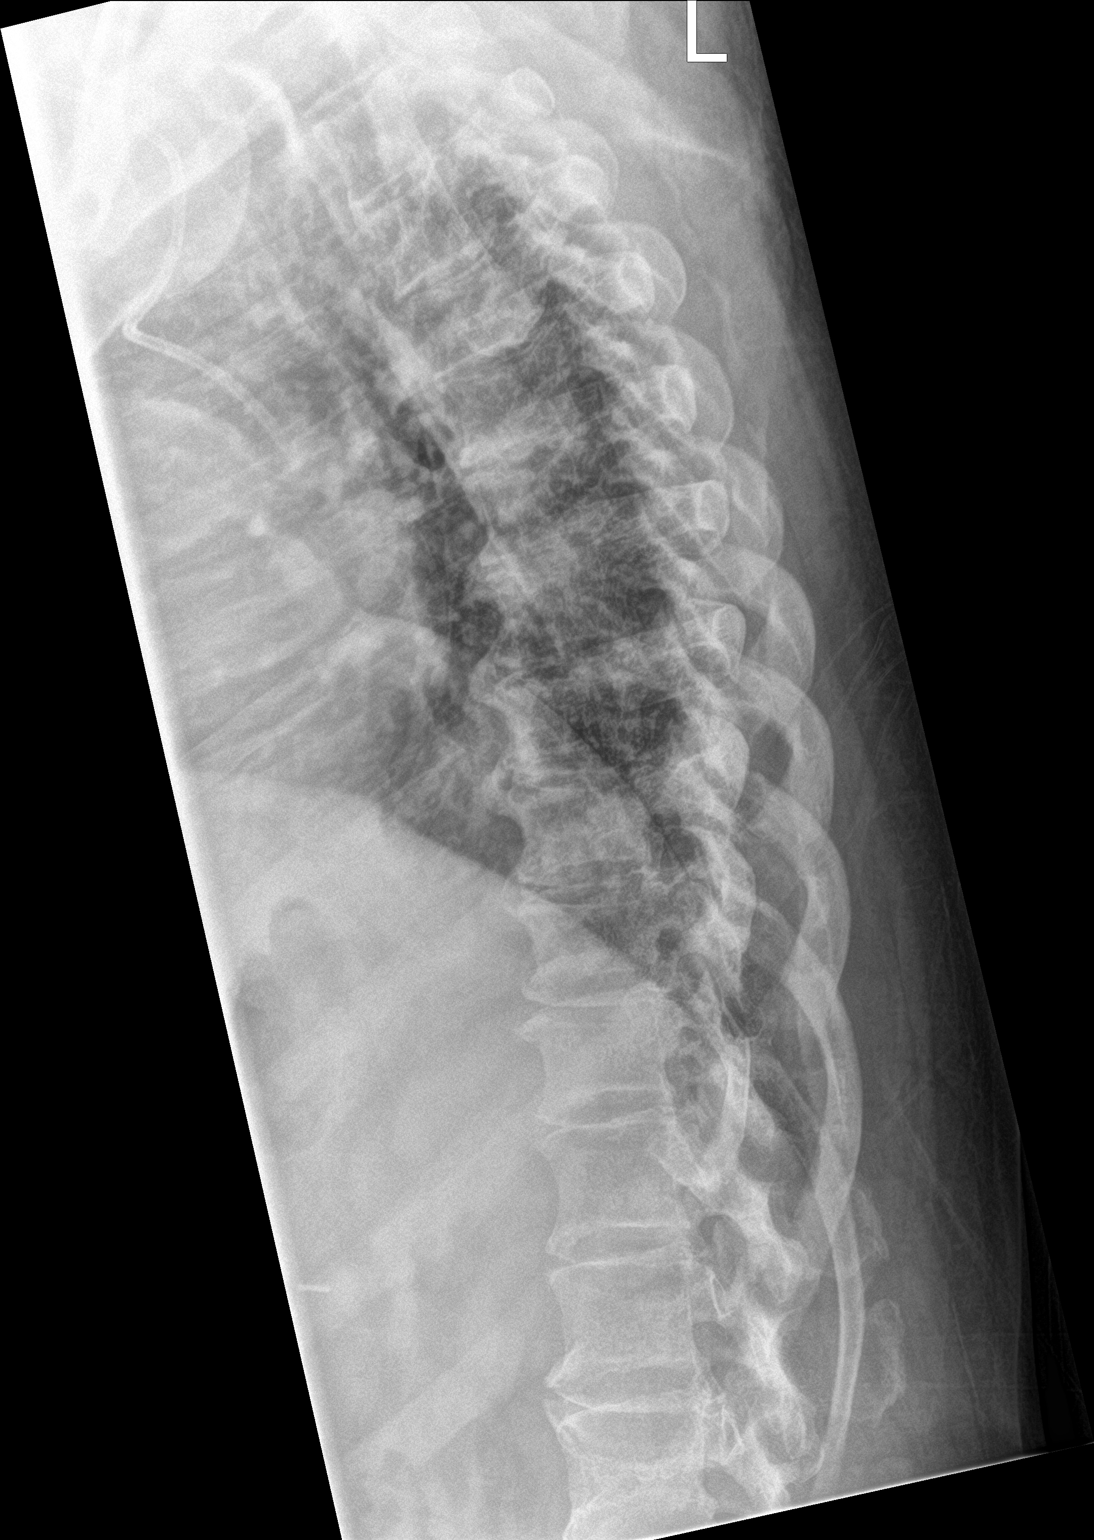

[t-spine swimmers]
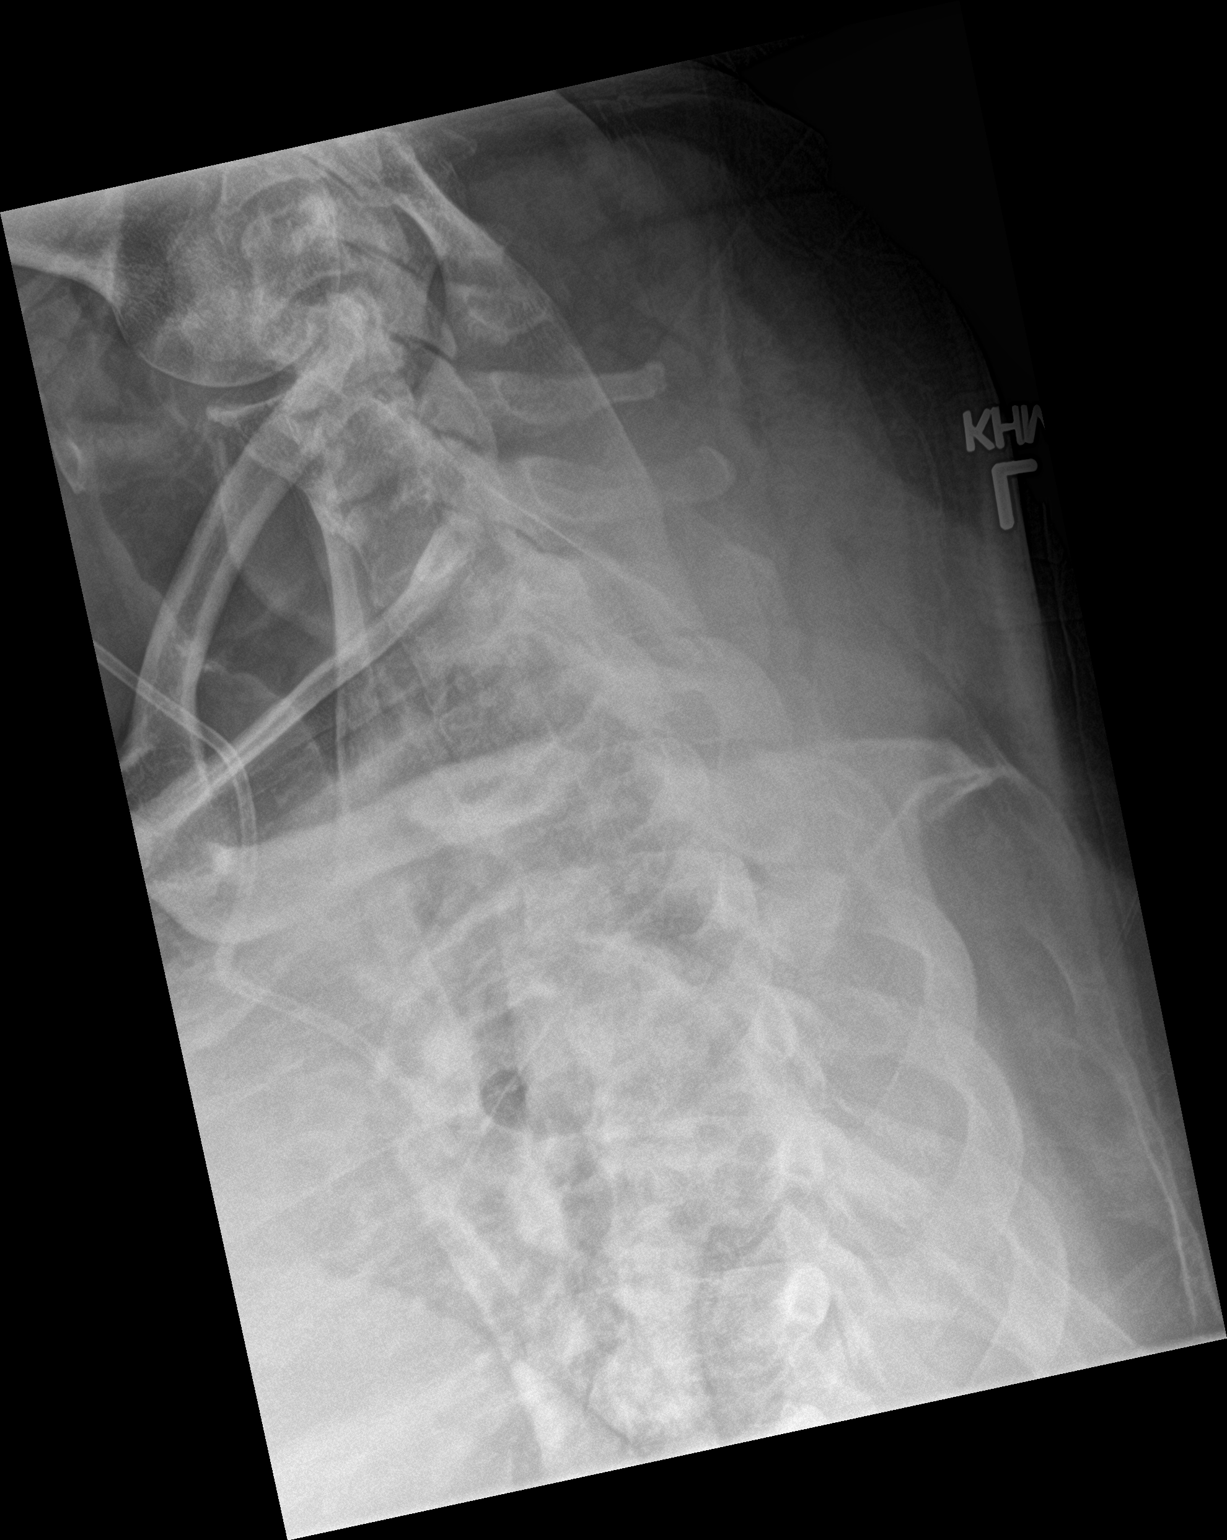

[t-spine ap]
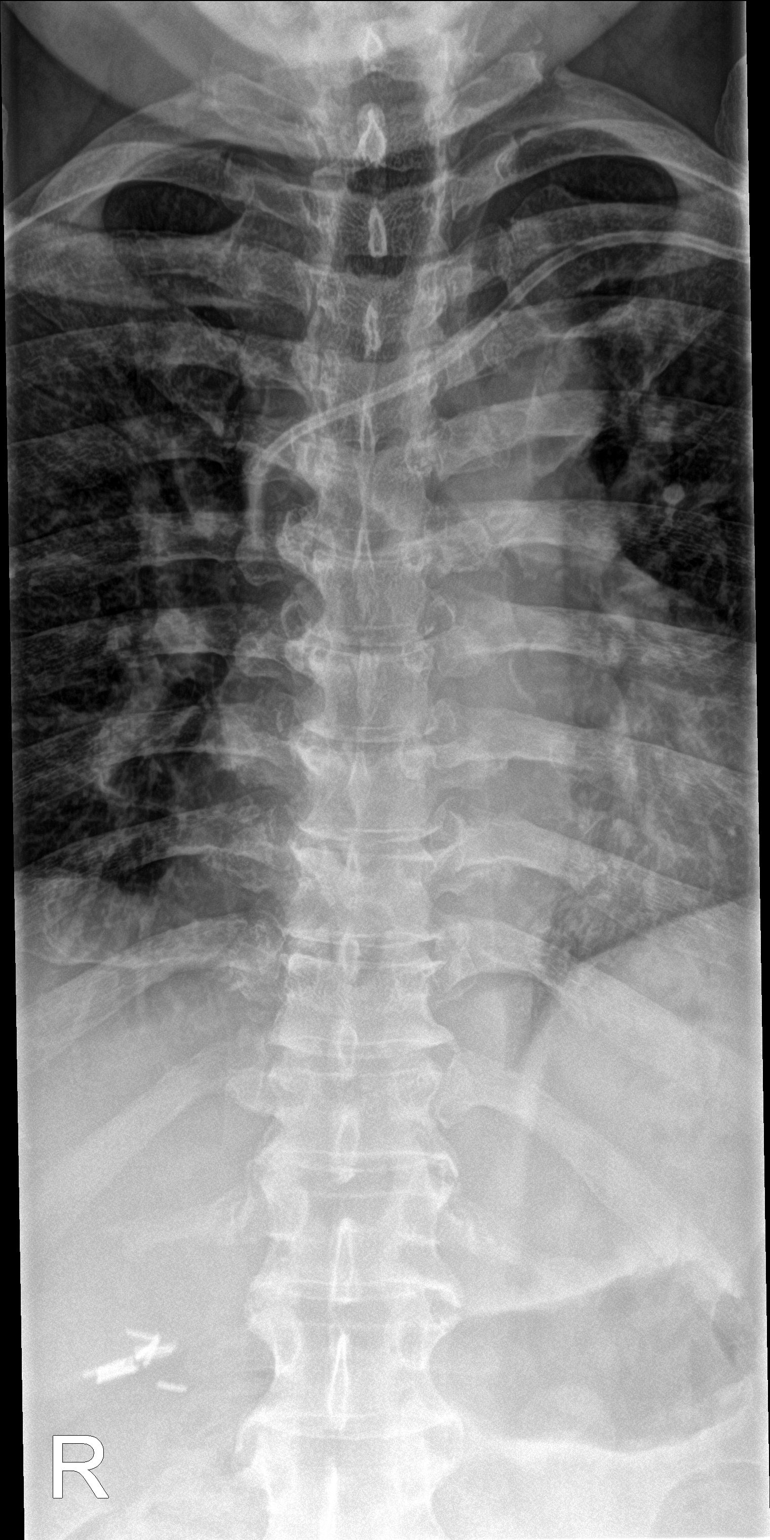

[3 of 3 positions shown; findings below may reference images not displayed]

FINDINGS: Thoracic Spine:

Thoracic vertebral elements maintain normal anatomic alignment, with
no evidence of anterolisthesis, retrolisthesis, or subluxation.

No acute fracture line identified.

Vertebral body heights maintained.

Mild endplate changes throughout the thoracic spine compatible with
degenerative disc disease.

Unremarkable appearance of the visualized thorax.

Port catheter from left chest subclavian approach is unchanged and
incompletely imaged
IMPRESSION: Negative for acute fracture or malalignment of the thoracic spine.

Mild degenerative changes of the thoracic spine.

## 2021-03-06 IMAGING — DX DG HIP (WITH OR WITHOUT PELVIS) 2-3V LEFT
3 series · 3 of 3 positions shown · non-contrast
Comparison: None

CLINICAL DATA: Back pain extending into LEFT leg for 2 days, fall

EXAM:
DG HIP (WITH OR WITHOUT PELVIS) 2-3V LEFT

[pelvis ap]
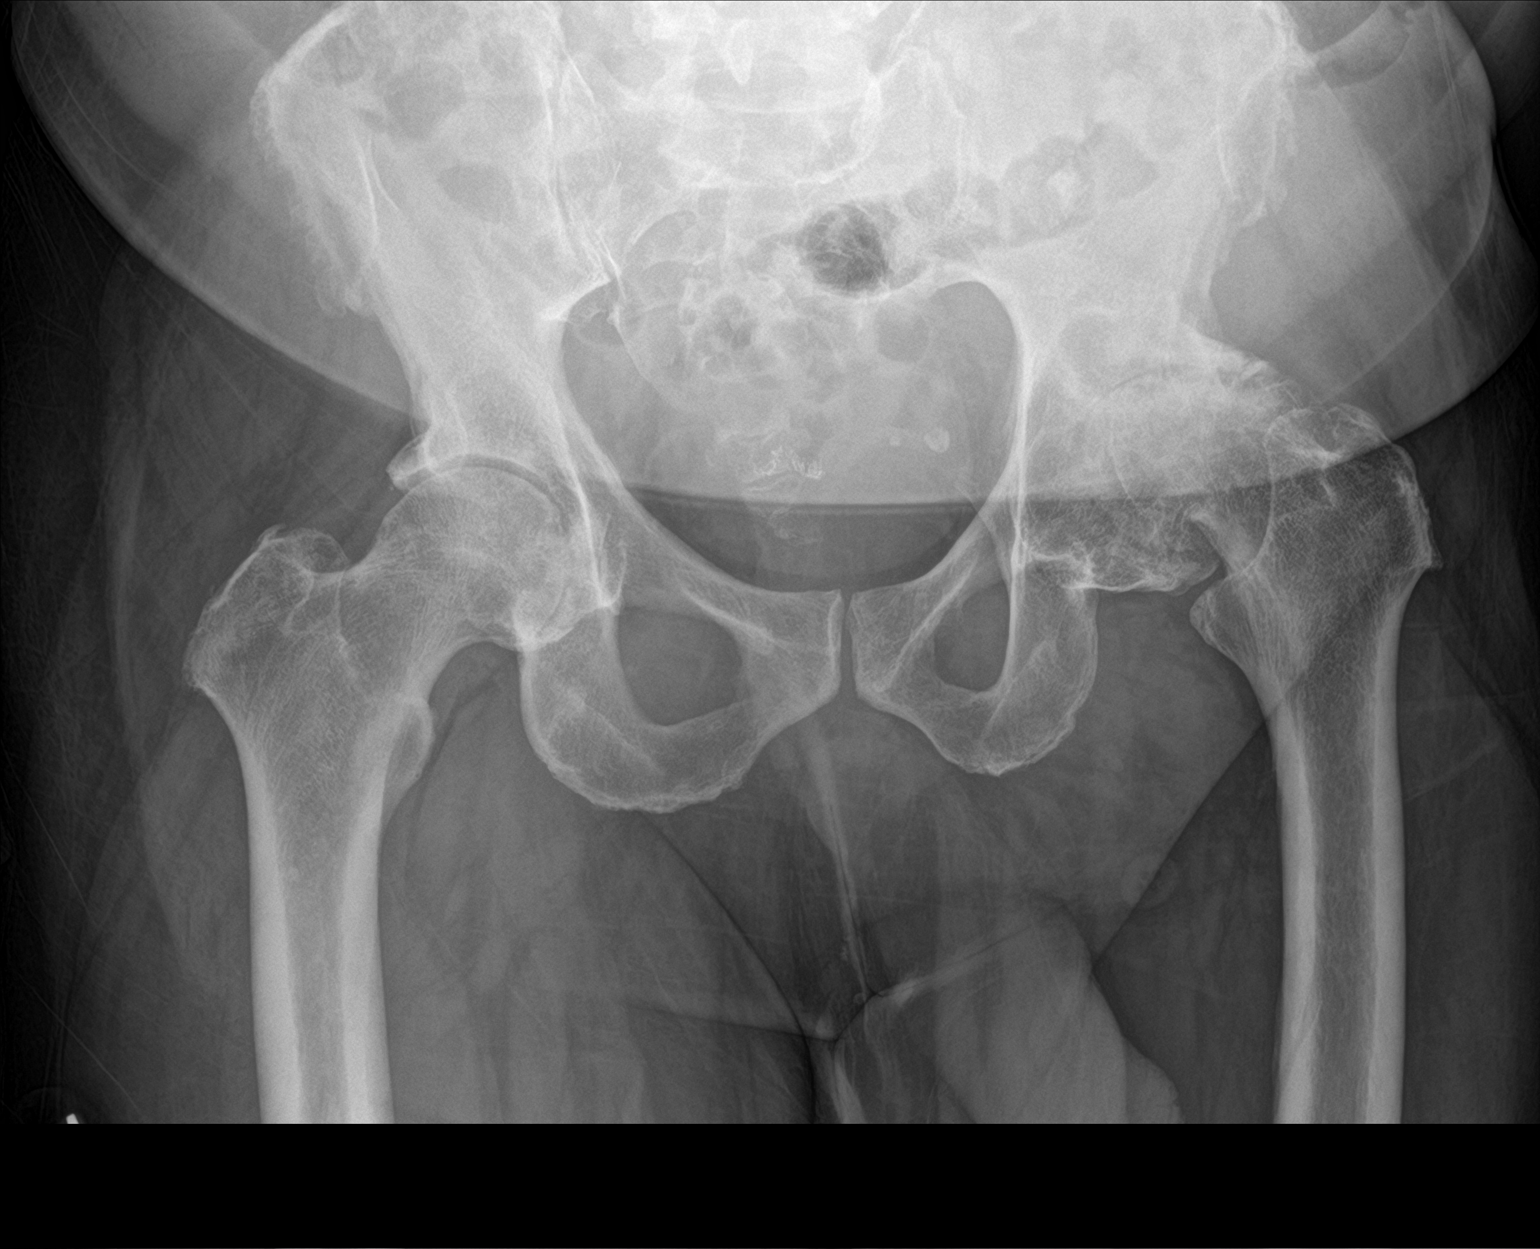

[hip ap]
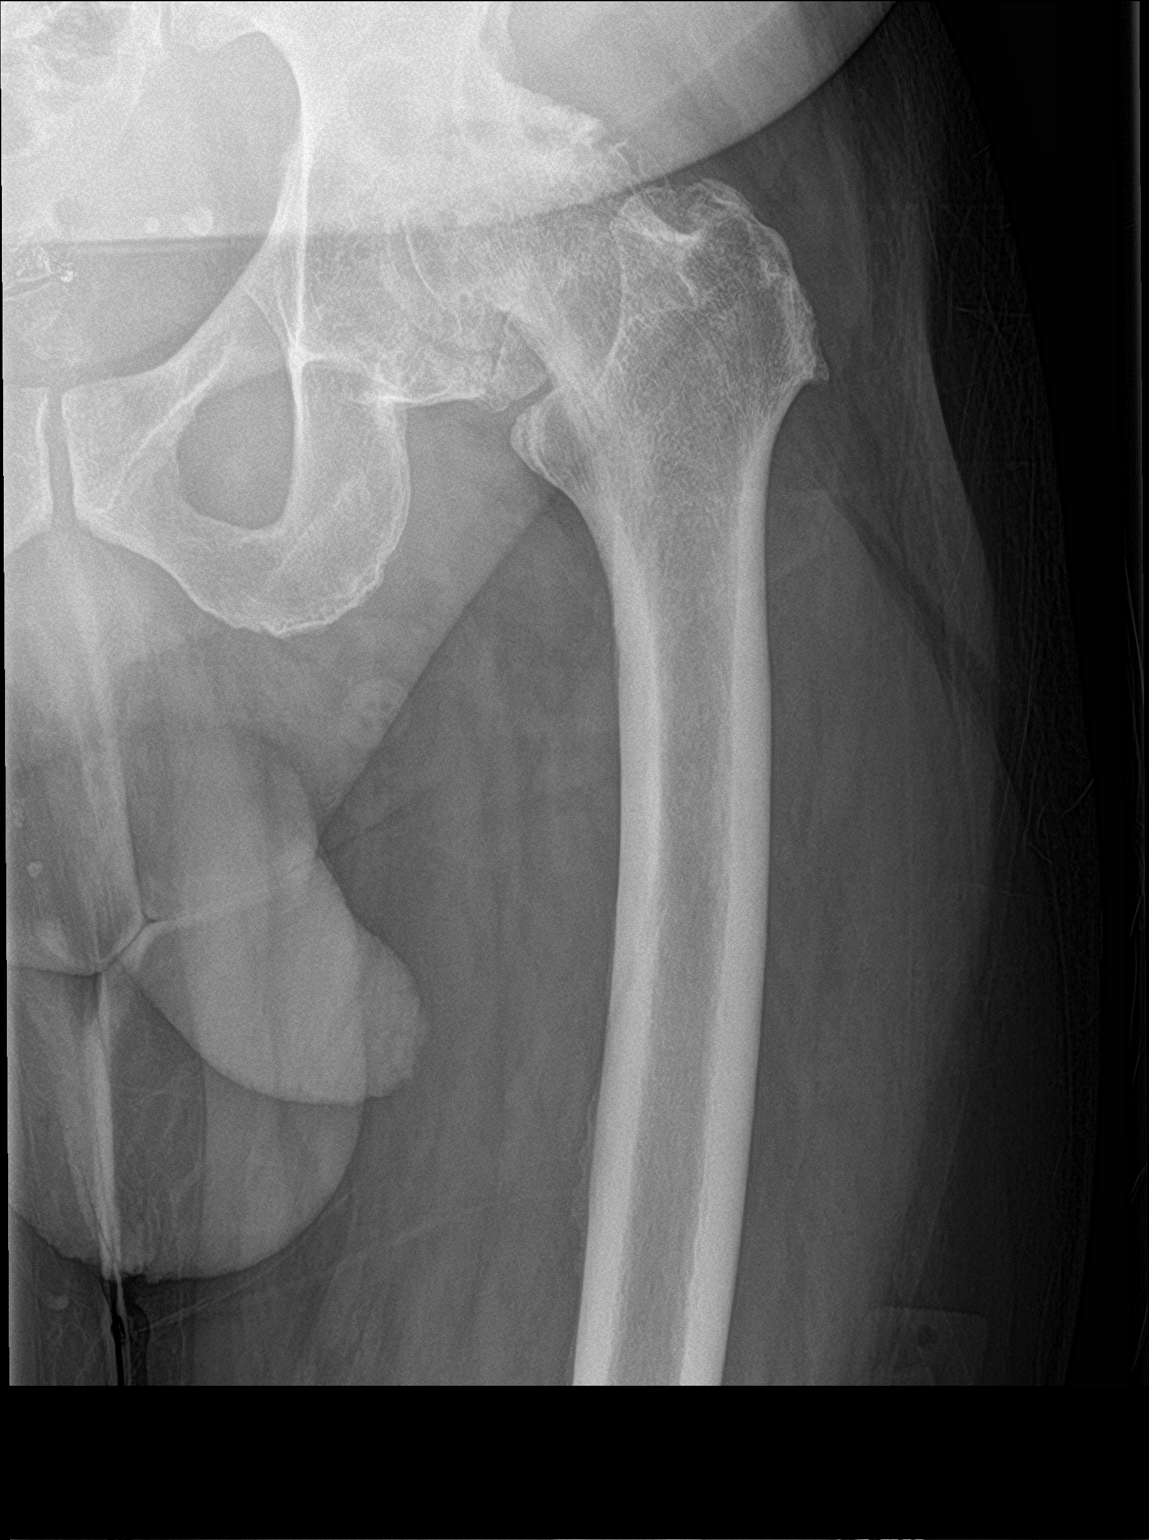

[hip lat]
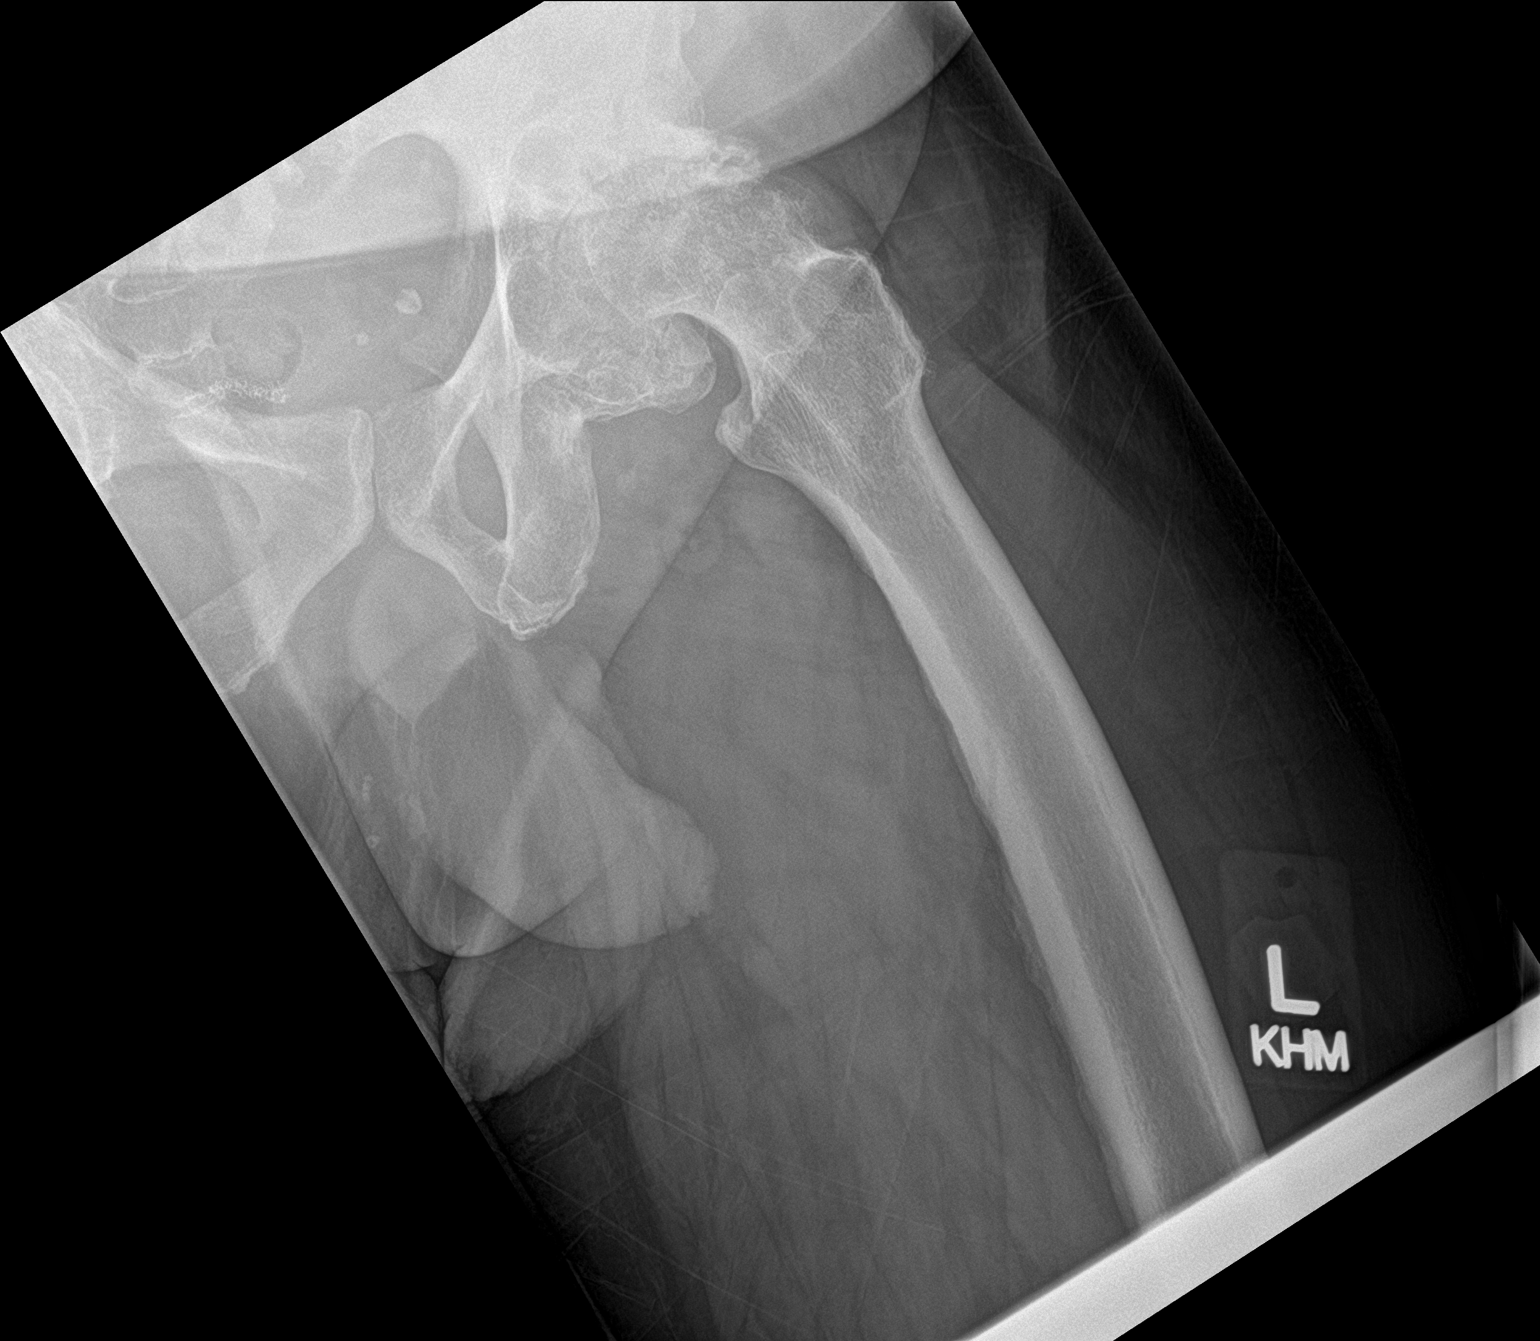

[3 of 3 positions shown; findings below may reference images not displayed]

FINDINGS: Osseous demineralization.

Minimal narrowing of the RIGHT hip joint.

SI joint spaces preserved.

Advanced osteoarthritic changes of the LEFT hip joint with joint
space narrowing and spur formation.

Bone-on-bone appearance and subchondral cystic change present at the
LEFT hip as well.

Mild superolateral subluxation of the LEFT humeral head.

No fracture, dislocation, or bone destruction.
IMPRESSION: Advanced osteoarthritic changes of the LEFT hip joint.

## 2021-03-07 ENCOUNTER — Other Ambulatory Visit: Payer: Self-pay

## 2021-03-07 ENCOUNTER — Other Ambulatory Visit (HOSPITAL_COMMUNITY)
Admission: RE | Admit: 2021-03-07 | Discharge: 2021-03-07 | Disposition: A | Discharge status: Home / Self Care | Payer: Medicare Other | Source: Ambulatory Visit | Attending: Internal Medicine | Admitting: Internal Medicine

## 2021-03-07 ENCOUNTER — Encounter (HOSPITAL_COMMUNITY): Payer: Self-pay | Admitting: Hematology

## 2021-03-07 ENCOUNTER — Inpatient Hospital Stay (HOSPITAL_BASED_OUTPATIENT_CLINIC_OR_DEPARTMENT_OTHER): Payer: Medicare Other | Admitting: Hematology

## 2021-03-07 VITALS — BP 155/84 | HR 89 | Temp 97.7°F | Resp 18 | Ht 62.0 in | Wt 205.0 lb

## 2021-03-07 DIAGNOSIS — E785 Hyperlipidemia, unspecified: Secondary | ICD-10-CM | POA: Diagnosis not present

## 2021-03-07 DIAGNOSIS — I10 Essential (primary) hypertension: Secondary | ICD-10-CM | POA: Insufficient documentation

## 2021-03-07 DIAGNOSIS — R7303 Prediabetes: Secondary | ICD-10-CM | POA: Diagnosis not present

## 2021-03-07 DIAGNOSIS — C20 Malignant neoplasm of rectum: Secondary | ICD-10-CM

## 2021-03-07 DIAGNOSIS — Z923 Personal history of irradiation: Secondary | ICD-10-CM | POA: Diagnosis not present

## 2021-03-07 DIAGNOSIS — Z9049 Acquired absence of other specified parts of digestive tract: Secondary | ICD-10-CM | POA: Diagnosis not present

## 2021-03-07 DIAGNOSIS — M1611 Unilateral primary osteoarthritis, right hip: Secondary | ICD-10-CM | POA: Diagnosis not present

## 2021-03-07 DIAGNOSIS — G8929 Other chronic pain: Secondary | ICD-10-CM | POA: Diagnosis not present

## 2021-03-07 DIAGNOSIS — Z87891 Personal history of nicotine dependence: Secondary | ICD-10-CM | POA: Diagnosis not present

## 2021-03-07 DIAGNOSIS — Z79899 Other long term (current) drug therapy: Secondary | ICD-10-CM | POA: Diagnosis not present

## 2021-03-07 DIAGNOSIS — Z85048 Personal history of other malignant neoplasm of rectum, rectosigmoid junction, and anus: Secondary | ICD-10-CM | POA: Diagnosis not present

## 2021-03-07 LAB — HEPATIC FUNCTION PANEL
ALT: 43 U/L (ref 0–44)
AST: 43 U/L — ABNORMAL HIGH (ref 15–41)
Albumin: 4.5 g/dL (ref 3.5–5.0)
Alkaline Phosphatase: 72 U/L (ref 38–126)
Bilirubin, Direct: 0.1 mg/dL (ref 0.0–0.2)
Indirect Bilirubin: 0.6 mg/dL (ref 0.3–0.9)
Total Bilirubin: 0.7 mg/dL (ref 0.3–1.2)
Total Protein: 8.5 g/dL — ABNORMAL HIGH (ref 6.5–8.1)

## 2021-03-07 LAB — CBC WITH DIFFERENTIAL/PLATELET
Abs Immature Granulocytes: 0.01 10*3/uL (ref 0.00–0.07)
Basophils Absolute: 0 10*3/uL (ref 0.0–0.1)
Basophils Relative: 1 %
Eosinophils Absolute: 0.3 10*3/uL (ref 0.0–0.5)
Eosinophils Relative: 6 %
HCT: 42.9 % (ref 39.0–52.0)
Hemoglobin: 14.1 g/dL (ref 13.0–17.0)
Immature Granulocytes: 0 %
Lymphocytes Relative: 28 %
Lymphs Abs: 1.3 10*3/uL (ref 0.7–4.0)
MCH: 30.2 pg (ref 26.0–34.0)
MCHC: 32.9 g/dL (ref 30.0–36.0)
MCV: 91.9 fL (ref 80.0–100.0)
Monocytes Absolute: 0.6 10*3/uL (ref 0.1–1.0)
Monocytes Relative: 12 %
Neutro Abs: 2.6 10*3/uL (ref 1.7–7.7)
Neutrophils Relative %: 53 %
Platelets: 215 10*3/uL (ref 150–400)
RBC: 4.67 MIL/uL (ref 4.22–5.81)
RDW: 12.3 % (ref 11.5–15.5)
WBC: 4.8 10*3/uL (ref 4.0–10.5)
nRBC: 0 % (ref 0.0–0.2)

## 2021-03-07 LAB — LIPID PANEL
Cholesterol: 160 mg/dL (ref 0–200)
HDL: 46 mg/dL (ref >40)
LDL Cholesterol: 59 mg/dL (ref 0–99)
Total CHOL/HDL Ratio: 3.5 RATIO
Triglycerides: 273 mg/dL — ABNORMAL HIGH (ref <150)
VLDL: 55 mg/dL — ABNORMAL HIGH (ref 0–40)

## 2021-03-07 LAB — BASIC METABOLIC PANEL
Anion gap: 9 (ref 5–15)
BUN: 9 mg/dL (ref 6–20)
CO2: 24 mmol/L (ref 22–32)
Calcium: 9.7 mg/dL (ref 8.9–10.3)
Chloride: 103 mmol/L (ref 98–111)
Creatinine, Ser: 1.2 mg/dL (ref 0.61–1.24)
GFR, Estimated: >60 mL/min (ref >60)
Glucose, Bld: 83 mg/dL (ref 70–99)
Potassium: 3.8 mmol/L (ref 3.5–5.1)
Sodium: 136 mmol/L (ref 135–145)

## 2021-03-07 LAB — HEMOGLOBIN A1C
Hgb A1c MFr Bld: 5.9 % — ABNORMAL HIGH (ref 4.8–5.6)
Mean Plasma Glucose: 122.63 mg/dL

## 2021-03-07 NOTE — Patient Instructions (Addendum)
[place] at [facility] Discharge Instructions  You were seen today by Dr. Delton Coombes. He went over your recent results and scans. Make an appointment with Dr. Gala Romney to have your colonoscopy done. Dr. Delton Coombes will see you back in 1 year for labs and follow up.   Thank you for choosing [facility] at [facility] to provide your oncology and hematology care.  To afford each patient quality time with our provider, please arrive at least 15 minutes before your scheduled appointment time.   If you have a lab appointment with the [facility] please come in thru the Main Entrance and check in at the main information desk  You need to re-schedule your appointment should you arrive 10 or more minutes late.  We strive to give you quality time with our providers, and arriving late affects you and other patients whose appointments are after yours.  Also, if you no show three or more times for appointments you may be dismissed from the clinic at the providers discretion.     Again, thank you for choosing [facility].  Our hope is that these requests will decrease the amount of time that you wait before being seen by our physicians.       _____________________________________________________________  Should you have questions after your visit to [facility], please contact our office at [phone] between the hours of 8:00 a.m. and 4:30 p.m.  Voicemails left after 4:00 p.m. will not be returned until the following business day.  For prescription refill requests, have your pharmacy contact our office and allow 72 hours.    Cancer Center Support Programs:   > Cancer Support Group  2nd Tuesday of the month 1pm-2pm, Journey Room

## 2021-03-07 NOTE — Progress Notes (Signed)
[name], [place] [address]   CLINIC:  Medical Oncology/Hematology  PCP:  Rosita Fire, MD [place] [address] [place] [address] [phone]   REASON FOR VISIT:  Follow-up for rectal cancer  PRIOR THERAPY:  1. Segmental resection of rectosigmoid on 07/06/2015. 2. FOLFOX x 6 cycles from 08/15/2015 to 11/06/2015. 3. Concurrent chemoradiation with 5-FU from 12/05/2015 to 01/16/2016 and x 2 cycles of FOLFOX consolidation to 02/13/2016.  NGS Results: Not done  CURRENT THERAPY: Surveillance  BRIEF ONCOLOGIC HISTORY:  Oncology History  Rectal cancer ([place])  03/31/2015 Initial Biopsy   Rectum, biopsy, rectal mass - INVASIVE ADENOCARCINOMA   06/02/2015 Tumor Marker   Results for Douglas, [name] (MRN [id number]) as of 08/28/2015 09:09  06/02/2015 16:00 CEA: 4.3    06/08/2015 Imaging   CT abd/pelvis-Rectal primary, without evidence of metastatic disease or acute complication.   06/24/2015 Imaging   CT chest- No specific features identified to suggest metastatic disease to the chest.   07/06/2015 Definitive Surgery   Robotic-assisted lower anterior resrction, rigid proctoscopy [name])- INVASIVE ADENOCARCINOMA, 2 cm, TUMOR INVADES INTO SUBMUCOSA. LVI/PNI (+).  1/14 LN (+). Margins neg.    07/06/2015 Miscellaneous   IHC Tumor Expression Results normal.  Negative for MLH1, MSH2, MSH6, & PMS2.    07/06/2015 Pathologic Stage   pT1, pN1a, pMx   08/15/2015 - 11/16/2015 Chemotherapy   FOLFOX x 6 cycles (adjuvant chemo "phase 1")   09/26/2015 Treatment Plan Change   Treatment held today.  5FU bolus D/C'd.  Neulasta OnPro added to each subsequent cycles of therapy.   10/04/2015 Treatment Plan Change   Neulasta onpro added to all subsequent cycles of treatment.   10/18/2015 Treatment Plan Change   Tx held for thrombocytopenia.  5FU CI is reduced by 20% for subsequent treatments.   12/05/2015 - 01/16/2016 Chemotherapy   5FU continuous infusion  weekly x 6 cycles; concurrent radiation. (adjuvant chemo "phase 2")   12/05/2015 - 01/18/2016 Radiation Therapy   Pelvic radiation completed in [facility]).  Pelvis 45 Gy in 25 fractions.  Then cone down boost treatment for additional 5.4 Gy in 8 fractions.  Total dose: 50.4 Gy in 33 treatments   01/30/2016 - 02/13/2016 Chemotherapy   FOLFOX x 2 cycles to complete 6 months of adjuvant chemotherapy.  (adjuvant chemo "phase 3")   05/21/2016 Procedure   Colonoscopy by Dr. Gala Romney- The procedure was aborted due to inadequate bowel prep. Patient readily admits to eating at least applesauce yesterday which was in conflict with our written and verbal preparation instructions. No specimens collected.   06/19/2016 Imaging   CT abd/pelvis- Postsurgical changes of the rectum. New presacral soft tissue thickening likely post treatment related. Recommend attention on followup. Large amount of stool in the colon compatible with constipation. No evidence for distant metastatic dz.    Imaging   CT abd/pelvis: IMPRESSION: Stable exam. No evidence of recurrent for metastatic carcinoma within the abdomen or pelvis.  Mild decrease in large colonic stool burden since prior study. No acute findings     CANCER STAGING: Cancer Staging Rectal cancer [facility]) Staging form: Colon and Rectum, AJCC 7th Edition - Pathologic stage from 09/12/2015: Stage IIIA (T1, N1a, cM0) - Signed by [name], PA-C on 09/25/2015 - Clinical stage from 09/26/2015: Stage IIIA (T1, N1a, M0) - Signed by [name], PA-C on 09/25/2015   INTERVAL HISTORY:  [name], a 60 y.o. male, returns for routine follow-up of  his rectal cancer. [name] was last seen by Faythe Casa, NP, on 09/26/2020.   Today he reports feeling okay. He complains of having right knee pain and had x-rays on 04/01. He denies having diarrhea, constipation or hematochezia. He sleeps with a CPAP machine.  He will get his colonoscopy in May.  REVIEW OF  SYSTEMS:  Review of Systems  Constitutional: Positive for appetite change (75%) and fatigue (75%).  Gastrointestinal: Negative for blood in stool, constipation and diarrhea.  Musculoskeletal: Positive for arthralgias (6/10 R knee pain).  All other systems reviewed and are negative.   PAST MEDICAL/SURGICAL HISTORY:  Past Medical History:  Diagnosis Date  . Arthritis   . Cancer ([place])    rectal  . Depression   . Diabetes mellitus    "Borderline"  . GERD (gastroesophageal reflux disease)    No weakness  . High cholesterol   . Hypertension   . Neuropathy    "back missed up"  . Rectal cancer ([place])   . Sleep apnea    Past Surgical History:  Procedure Laterality Date  . BACK SURGERY    . BIOPSY  03/31/2015   Procedure: BIOPSY;  Surgeon: Daneil Dolin, MD;  Location: [facility];  Service: Endoscopy;;  . CHOLECYSTECTOMY    . COLONOSCOPY WITH PROPOFOL N/A 03/31/2015   Procedure: COLONOSCOPY WITH PROPOFOL at cecum 0842; withdrawal time=50minutes;  Surgeon: Daneil Dolin, MD;  Location: [facility];  Service: Endoscopy;  Laterality: N/A;  . COLONOSCOPY WITH PROPOFOL N/A 05/21/2016   Procedure: COLONOSCOPY WITH PROPOFOL;  Surgeon: Daneil Dolin, MD;  Location: [facility];  Service: Endoscopy;  Laterality: N/A;  200 - moved to 12:45 - office calling pt with 11:15 arrival time  . COLONOSCOPY WITH PROPOFOL N/A 07/26/2016   Procedure: COLONOSCOPY WITH PROPOFOL;  Surgeon: Daneil Dolin, MD;  Location: [facility];  Service: Endoscopy;  Laterality: N/A;  115  . COLONOSCOPY WITH PROPOFOL N/A 07/28/2018   Procedure: COLONOSCOPY WITH PROPOFOL;  Surgeon: Daneil Dolin, MD;  Location: [facility];  Service: Endoscopy;  Laterality: N/A;  1:45pm - unable to reach pt to move up  . FLEXIBLE SIGMOIDOSCOPY N/A 07/05/2015   Procedure: FLEXIBLE SIGMOIDOSCOPY;  Surgeon: [name], MD;  Location: [facility];  Service: Endoscopy;  Laterality: N/A;  with tattoo  . POLYPECTOMY  03/31/2015   Procedure:  POLYPECTOMY;  Surgeon: Daneil Dolin, MD;  Location: [facility];  Service: Endoscopy;;  . PORTACATH PLACEMENT N/A 08/09/2015   Procedure: INSERTION PORT-A-CATH LEFT SUBCLAVIAN;  Surgeon: [name], MD;  Location: [facility];  Service: General;  Laterality: N/A;  . XI ROBOTIC ASSISTED LOWER ANTERIOR RESECTION N/A 07/06/2015   Procedure: XI ROBOTIC ASSISTED LOWER ANTERIOR RESECTION, rigid proctoscopy;  Surgeon: [name], MD;  Location: [facility];  Service: General;  Laterality: N/A;    SOCIAL HISTORY:  Social History   Socioeconomic History  . Marital status: Divorced    Spouse name: Not on file  . Number of children: Not on file  . Years of education: Not on file  . Highest education level: Not on file  Occupational History  . Not on file  Tobacco Use  . Smoking status: Former Smoker    Packs/day: 0.25    Years: 2.00    Pack years: 0.50    Types: Cigarettes    Quit date: 03/23/2006    Years since quitting: 14.9  . Smokeless tobacco: Never Used  Vaping Use  . Vaping Use: Never used  Substance  and Sexual Activity  . Alcohol use: No    Alcohol/week: 0.0 standard drinks  . Drug use: No  . Sexual activity: Not Currently  Other Topics Concern  . Not on file  Social History Narrative  . Not on file   Social Determinants of Health   Financial Resource Strain: Low Risk   . Difficulty of Paying Living Expenses: Not hard at all  Food Insecurity: No Food Insecurity  . Worried About Charity fundraiser in the Last Year: Never true  . Ran Out of Food in the Last Year: Never true  Transportation Needs: No Transportation Needs  . Lack of Transportation (Medical): No  . Lack of Transportation (Non-Medical): No  Physical Activity: Inactive  . Days of Exercise per Week: 0 days  . Minutes of Exercise per Session: 0 min  Stress: No Stress Concern Present  . Feeling of Stress : Not at all  Social Connections: Socially Isolated  . Frequency of Communication with Friends and Family: More than  three times a week  . Frequency of Social Gatherings with Friends and Family: More than three times a week  . Attends Religious Services: Never  . Active Member of Clubs or Organizations: No  . Attends Archivist Meetings: Never  . Marital Status: Divorced  Human resources officer Violence: Not At Risk  . Fear of Current or Ex-Partner: No  . Emotionally Abused: No  . Physically Abused: No  . Sexually Abused: No    FAMILY HISTORY:  Family History  Problem Relation Age of Onset  . Hypertension Other        "Not sure who, I don't know much about my family"  . Diabetes Other        "Not sure who, I don't know much about my family"  . Colon cancer Neg Hx        "Not that I know of"  . Colon polyps Neg Hx        "not that I know of"    CURRENT MEDICATIONS:  Current Outpatient Medications  Medication Sig Dispense Refill  . ACCU-CHEK FASTCLIX LANCETS MISC     . ACCU-CHEK SMARTVIEW test strip     . Alcohol Swabs (B-D SINGLE USE SWABS REGULAR) PADS     . amLODipine (NORVASC) 5 MG tablet Take 1 tablet by mouth daily.    Marland Kitchen gabapentin (NEURONTIN) 800 MG tablet Take 1 tablet (800 mg total) by mouth 3 (three) times daily. (Patient taking differently: Take 800 mg by mouth 2 (two) times daily.) 90 tablet 2  . lisinopril (PRINIVIL,ZESTRIL) 40 MG tablet Take 1 tablet (40 mg total) by mouth every morning. 30 tablet 2  . NON FORMULARY PT HAS A C-PAP MACHINE    . nortriptyline (PAMELOR) 75 MG capsule Take 1 capsule (75 mg total) by mouth at bedtime. 30 capsule 2  . oxyCODONE-acetaminophen (PERCOCET) 10-325 MG tablet Take 1 tablet by mouth 5 (five) times daily.    Marland Kitchen tiZANidine (ZANAFLEX) 2 MG tablet Take 1 tablet (2 mg total) by mouth every 8 (eight) hours as needed for muscle spasms. (Patient taking differently: Take 2 mg by mouth 2 (two) times daily.) 30 tablet 2   No current facility-administered medications for this visit.    ALLERGIES:  No Known Allergies  PHYSICAL EXAM:  Performance  status (ECOG): 1 - Symptomatic but completely ambulatory  Vitals:   03/07/21 1415  BP: (!) 155/84  Pulse: 89  Resp: 18  Temp: 97.7 F (36.5 C)  SpO2: 98%   Wt Readings from Last 3 Encounters:  03/07/21 205 lb (93 kg)  09/26/20 201 lb (91.2 kg)  05/04/20 210 lb (95.3 kg)   Physical Exam Vitals reviewed.  Constitutional:      Appearance: Normal appearance. He is obese.  Cardiovascular:     Rate and Rhythm: Normal rate and regular rhythm.     Pulses: Normal pulses.     Heart sounds: Normal heart sounds.  Pulmonary:     Effort: Pulmonary effort is normal.     Breath sounds: Normal breath sounds.  Abdominal:     Palpations: Abdomen is soft. There is no hepatomegaly or mass.     Tenderness: There is no abdominal tenderness.  Neurological:     General: No focal deficit present.     Mental Status: He is alert and oriented to person, place, and time.  Psychiatric:        Mood and Affect: Mood normal.        Behavior: Behavior normal.      LABORATORY DATA:  I have reviewed the labs as listed.  CBC Latest Ref Rng & Units 03/07/2021 02/28/2021 08/29/2020  WBC 4.0 - 10.5 K/uL 4.8 4.5 5.4  Hemoglobin 13.0 - 17.0 g/dL 14.1 13.2 14.1  Hematocrit 39.0 - 52.0 % 42.9 41.5 43.4  Platelets 150 - 400 K/uL 215 190 192   CMP Latest Ref Rng & Units 03/07/2021 02/28/2021 08/29/2020  Glucose 70 - 99 mg/dL 83 106(H) 86  BUN 6 - 20 mg/dL _0 Creatinine 0.61 - 1.24 mg/dL 1.20 1.27(H) 1.11  Sodium 135 - 145 mmol/L 136 138 139  Potassium 3.5 - 5.1 mmol/L 3.8 3.6 3.7  Chloride 98 - 111 mmol/L 103 103 105  CO2 22 - 32 mmol/L _1 Calcium 8.9 - 10.3 mg/dL 9.7 9.0 9.5  Total Protein 6.5 - 8.1 g/dL 8.5(H) 7.9 7.9  Total Bilirubin 0.3 - 1.2 mg/dL 0.7 0.8 0.9  Alkaline Phos 38 - 126 U/L 72 68 78  AST 15 - 41 U/L 43(H) 54(H) 51(H)  ALT 0 - 44 U/L 43 63(H) 53(H)   Lab Results  Component Value Date   CEA1 4.2 02/28/2021   CEA1 5.0 (H) 08/29/2020   CEA1 5.6 (H) 04/13/2020     DIAGNOSTIC IMAGING:  I have independently reviewed the scans and discussed with the patient. DG Knee 3 Views Right  Result Date: 02/20/2021 CLINICAL DATA:  Generalized right knee pain.  No known injury. EXAM: RIGHT KNEE - 3 VIEW COMPARISON:  None. FINDINGS: No fracture or dislocation. 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 There is minimal spurring of the tibial spines. No evidence of chondrocalcinosis. No joint effusion. Regional soft tissues appear normal. 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 Electronically Signed   By: Sandi Mariscal M.D.   On: 02/20/2021 01:54     ASSESSMENT:  1.  Stage III rectal adenocarcinoma: -Status post segmental resection of the rectosigmoid cancer on 07/06/2015, pathology showing 1/14 lymph nodes positive, margins negative, MMR normal. -6 cycles of adjuvant FOLFOX from 08/15/2015 through 11/06/2015 followed by 5-FU and XRT from 12/05/2015 through 01/16/2016 followed by 2 more cycles of FOLFOX from 01/30/2016 through 02/13/2016. -Last colonoscopy on 07/28/2018 showed diverticulosis of the sigmoid colon and descending colon. -Patient had CT CAP on 04/21/2020 which showed no evidence of metastatic disease.  Did show degree of colonic distention suggests constipation.  Cecal distention now is near 10 cm without perirectal stranding this is increased from prior scans.        PLAN:  1.  Stage III rectal adenocarcinoma: - Denies any bowel changes.  No bleeding per rectum. - Reviewed labs which showed slightly elevated AST.  This is thought to be secondary to fatty liver.  CEA was 4.2.  CBC was normal. - He is having colonoscopy next month. - Recommend follow-up visit in 1 year with repeat CEA.  We will do CT scans if clinical condition dictates.  2.  Chronic hip  pain: -This is from osteoarthritis.   Orders placed this encounter:  Orders Placed This Encounter  Procedures  . CBC with Differential/Platelet  . Comprehensive metabolic panel  . Lactate dehydrogenase  . Magnesium  . CEA     Derek Jack, MD [place] [phone]   I, Milinda Antis, am acting as a scribe for Dr. Sanda Linger.  I, Derek Jack MD, have reviewed the above documentation for accuracy and completeness, and I agree with the above.

## 2021-03-08 ENCOUNTER — Encounter: Payer: Self-pay | Admitting: General Practice

## 2021-03-08 NOTE — Progress Notes (Signed)
[place] Psychosocial Distress Screening Clinical Social Work  Clinical Social Work was referred by distress screening protocol.  The patient scored a 7 on the Psychosocial Distress Thermometer which indicates moderate distress. Clinical Social Worker contacted patient by phone to assess for distress and other psychosocial needs.  His insurance company takes him to all his appointments - he has to set these up in advance.   He is frustrated about having to pay copays at various medical practices, especially at his pain management clinic.  He is frustrated by lack of transportation to non [facility] facility appointments and/or inconsistent performance by transportation companies provided by his Medicare.  His issues are not related to his cancer diagnosis/treatment, CSW stressed that he needs to contact DSS to get further information about his Medicaid eligibility.    ONCBCN DISTRESS SCREENING 03/07/2021  Screening Type Change in Status  Distress experienced in past week (1-10) 7  Practical problem type Transportation  Family Problem type Children  Emotional problem type Depression;Nervousness/Anxiety  Physical Problem type   Other     Clinical Social Worker follow up needed: No.  If yes, follow up plan:  Beverely Pace, [place], LCSW Clinical Social Worker Phone:  [phone]

## 2021-03-15 ENCOUNTER — Encounter: Payer: Self-pay | Admitting: Internal Medicine

## 2021-03-22 DIAGNOSIS — I10 Essential (primary) hypertension: Secondary | ICD-10-CM | POA: Diagnosis not present

## 2021-03-22 DIAGNOSIS — E119 Type 2 diabetes mellitus without complications: Secondary | ICD-10-CM | POA: Diagnosis not present

## 2021-03-22 DIAGNOSIS — G4733 Obstructive sleep apnea (adult) (pediatric): Secondary | ICD-10-CM | POA: Diagnosis not present

## 2021-03-23 DIAGNOSIS — M5417 Radiculopathy, lumbosacral region: Secondary | ICD-10-CM | POA: Diagnosis not present

## 2021-03-23 DIAGNOSIS — Z79891 Long term (current) use of opiate analgesic: Secondary | ICD-10-CM | POA: Diagnosis not present

## 2021-03-23 DIAGNOSIS — M961 Postlaminectomy syndrome, not elsewhere classified: Secondary | ICD-10-CM | POA: Diagnosis not present

## 2021-03-23 DIAGNOSIS — Z79899 Other long term (current) drug therapy: Secondary | ICD-10-CM | POA: Diagnosis not present

## 2021-03-23 DIAGNOSIS — M5136 Other intervertebral disc degeneration, lumbar region: Secondary | ICD-10-CM | POA: Diagnosis not present

## 2021-03-23 DIAGNOSIS — G894 Chronic pain syndrome: Secondary | ICD-10-CM | POA: Diagnosis not present

## 2021-03-30 DIAGNOSIS — I10 Essential (primary) hypertension: Secondary | ICD-10-CM | POA: Diagnosis not present

## 2021-03-30 DIAGNOSIS — E785 Hyperlipidemia, unspecified: Secondary | ICD-10-CM | POA: Diagnosis not present

## 2021-04-14 ENCOUNTER — Other Ambulatory Visit (HOSPITAL_BASED_OUTPATIENT_CLINIC_OR_DEPARTMENT_OTHER): Payer: Self-pay

## 2021-04-14 DIAGNOSIS — G4733 Obstructive sleep apnea (adult) (pediatric): Secondary | ICD-10-CM

## 2021-04-18 DIAGNOSIS — Z79899 Other long term (current) drug therapy: Secondary | ICD-10-CM | POA: Diagnosis not present

## 2021-04-18 DIAGNOSIS — M1710 Unilateral primary osteoarthritis, unspecified knee: Secondary | ICD-10-CM | POA: Diagnosis not present

## 2021-04-18 DIAGNOSIS — G894 Chronic pain syndrome: Secondary | ICD-10-CM | POA: Diagnosis not present

## 2021-04-18 DIAGNOSIS — Z79891 Long term (current) use of opiate analgesic: Secondary | ICD-10-CM | POA: Diagnosis not present

## 2021-04-18 DIAGNOSIS — M1711 Unilateral primary osteoarthritis, right knee: Secondary | ICD-10-CM | POA: Diagnosis not present

## 2021-04-18 DIAGNOSIS — M961 Postlaminectomy syndrome, not elsewhere classified: Secondary | ICD-10-CM | POA: Diagnosis not present

## 2021-04-18 DIAGNOSIS — M5136 Other intervertebral disc degeneration, lumbar region: Secondary | ICD-10-CM | POA: Diagnosis not present

## 2021-04-25 DIAGNOSIS — G5603 Carpal tunnel syndrome, bilateral upper limbs: Secondary | ICD-10-CM | POA: Diagnosis not present

## 2021-04-26 ENCOUNTER — Other Ambulatory Visit: Payer: Self-pay

## 2021-04-26 ENCOUNTER — Ambulatory Visit: Payer: Medicare Other | Attending: Neurology | Admitting: Neurology

## 2021-04-26 DIAGNOSIS — G4733 Obstructive sleep apnea (adult) (pediatric): Secondary | ICD-10-CM

## 2021-04-28 DIAGNOSIS — G4733 Obstructive sleep apnea (adult) (pediatric): Secondary | ICD-10-CM | POA: Diagnosis not present

## 2021-04-30 DIAGNOSIS — I10 Essential (primary) hypertension: Secondary | ICD-10-CM | POA: Diagnosis not present

## 2021-04-30 DIAGNOSIS — G4733 Obstructive sleep apnea (adult) (pediatric): Secondary | ICD-10-CM | POA: Diagnosis not present

## 2021-05-04 ENCOUNTER — Encounter: Payer: Self-pay | Admitting: Internal Medicine

## 2021-05-11 NOTE — Procedures (Signed)
   [name], MD     [email or web address]             HOME SLEEP STUDY  LOCATION: [facility]  Patient Name: [name], [name] Date: 04/28/2021 Gender: Male D.O.B: 07/04/1961 Age (years): 32 Referring Provider: Phillips Odor MD, ABSM Height (inches): 62 Interpreting Physician: Phillips Odor MD, ABSM Weight (lbs): 205 RPSGT: Rosebud Poles BMI: 37 MRN: [id number] Neck Size: CLINICAL INFORMATION Sleep Study Type: HST     Indication for sleep study: N/A     Epworth Sleepiness Score: N/A  SLEEP STUDY TECHNIQUE A multi-channel overnight portable sleep study was performed. The channels recorded were: nasal airflow, thoracic respiratory movement, and oxygen saturation with a pulse oximetry. Snoring was also monitored.  MEDICATIONS Patient self administered medications include: N/A.  Current Outpatient Medications:    ACCU-CHEK FASTCLIX LANCETS MISC, , Disp: , Rfl:    ACCU-CHEK SMARTVIEW test strip, , Disp: , Rfl:    Alcohol Swabs (B-D SINGLE USE SWABS REGULAR) PADS, , Disp: , Rfl:    amLODipine (NORVASC) 5 MG tablet, Take 1 tablet by mouth daily., Disp: , Rfl:    gabapentin (NEURONTIN) 800 MG tablet, Take 1 tablet (800 mg total) by mouth 3 (three) times daily. (Patient taking differently: Take 800 mg by mouth 2 (two) times daily.), Disp: 90 tablet, Rfl: 2   lisinopril (PRINIVIL,ZESTRIL) 40 MG tablet, Take 1 tablet (40 mg total) by mouth every morning., Disp: 30 tablet, Rfl: 2   NON FORMULARY, PT HAS A C-PAP MACHINE, Disp: , Rfl:    nortriptyline (PAMELOR) 75 MG capsule, Take 1 capsule (75 mg total) by mouth at bedtime., Disp: 30 capsule, Rfl: 2   oxyCODONE-acetaminophen (PERCOCET) 10-325 MG tablet, Take 1 tablet by mouth 5 (five) times daily., Disp: , Rfl:    tiZANidine (ZANAFLEX) 2 MG tablet, Take 1 tablet (2 mg total) by mouth every 8 (eight) hours as needed for muscle spasms. (Patient taking differently: Take 2 mg by mouth 2 (two) times  daily.), Disp: 30 tablet, Rfl: 2   SLEEP ARCHITECTURE Patient was studied for 328.1 minutes. The sleep efficiency was 75.6 % and the patient was supine for 8.6%. The arousal index was 0.0 per hour.  RESPIRATORY PARAMETERS The overall AHI was 10.6 per hour, with a central apnea index of 0 per hour.  The oxygen nadir was 74% during sleep.     CARDIAC DATA Mean heart rate during sleep was 76.9 bpm.  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 A trial of auto PAP 8-14 is recommended.   Delano Metz, MD Diplomate, American Board of Sleep Medicine. ELECTRONICALLY SIGNED ON:  05/11/2021, 5:45 PM [place] PH: [phone]   FX: [phone] [place]

## 2021-05-15 DIAGNOSIS — M79643 Pain in unspecified hand: Secondary | ICD-10-CM | POA: Diagnosis not present

## 2021-05-15 DIAGNOSIS — M5136 Other intervertebral disc degeneration, lumbar region: Secondary | ICD-10-CM | POA: Diagnosis not present

## 2021-05-15 DIAGNOSIS — G894 Chronic pain syndrome: Secondary | ICD-10-CM | POA: Diagnosis not present

## 2021-05-15 DIAGNOSIS — M1710 Unilateral primary osteoarthritis, unspecified knee: Secondary | ICD-10-CM | POA: Diagnosis not present

## 2021-05-23 DIAGNOSIS — G5603 Carpal tunnel syndrome, bilateral upper limbs: Secondary | ICD-10-CM | POA: Diagnosis not present

## 2021-05-31 DIAGNOSIS — C20 Malignant neoplasm of rectum: Secondary | ICD-10-CM | POA: Diagnosis not present

## 2021-05-31 DIAGNOSIS — I10 Essential (primary) hypertension: Secondary | ICD-10-CM | POA: Diagnosis not present

## 2021-06-06 ENCOUNTER — Inpatient Hospital Stay (HOSPITAL_COMMUNITY): Payer: Medicare Other | Attending: Hematology

## 2021-06-06 ENCOUNTER — Other Ambulatory Visit: Payer: Self-pay

## 2021-06-06 DIAGNOSIS — Z452 Encounter for adjustment and management of vascular access device: Secondary | ICD-10-CM | POA: Diagnosis not present

## 2021-06-06 DIAGNOSIS — Z85048 Personal history of other malignant neoplasm of rectum, rectosigmoid junction, and anus: Secondary | ICD-10-CM | POA: Diagnosis not present

## 2021-06-06 MED ORDER — HEPARIN SOD (PORK) LOCK FLUSH 100 UNIT/ML IV SOLN
500.0000 [IU] | Freq: Once | INTRAVENOUS | Status: AC
  Administered 2021-06-06: 500 [IU] via INTRAVENOUS

## 2021-06-06 MED ORDER — SODIUM CHLORIDE 0.9% FLUSH
10.0000 mL | INTRAVENOUS | Status: DC | PRN
  Administered 2021-06-06: 10 mL via INTRAVENOUS

## 2021-06-06 NOTE — Progress Notes (Signed)
Pt presents today for port flush per provider's order. Blood return noted port flushed easily with 38ml NS and 50ml of heparin. Pt voiced no new complaints at this time. Pt stable during and after treatment. Pt discharged ambulatory via cane in stable condition.

## 2021-06-06 NOTE — Patient Instructions (Signed)
[facility]  Discharge Instructions: ?Thank you for choosing [facility] to provide your oncology and hematology care.  ?If you have a lab appointment with the Cancer Center, please come in thru the Main Entrance and check in at the main information desk. ? ?Wear comfortable clothing and clothing appropriate for easy access to any Portacath or PICC line.  ? ?We strive to give you quality time with your provider. You may need to reschedule your appointment if you arrive late (15 or more minutes).  Arriving late affects you and other patients whose appointments are after yours.  Also, if you miss three or more appointments without notifying the office, you may be dismissed from the clinic at the provider?s discretion.    ?  ?For prescription refill requests, have your pharmacy contact our office and allow 72 hours for refills to be completed.   ? ?Today you received port flush ?  ? ?BELOW ARE SYMPTOMS THAT SHOULD BE REPORTED IMMEDIATELY: ?*FEVER GREATER THAN 100.4 F (38 ?C) OR HIGHER ?*CHILLS OR SWEATING ?*NAUSEA AND VOMITING THAT IS NOT CONTROLLED WITH YOUR NAUSEA MEDICATION ?*UNUSUAL SHORTNESS OF BREATH ?*UNUSUAL BRUISING OR BLEEDING ?*URINARY PROBLEMS (pain or burning when urinating, or frequent urination) ?*BOWEL PROBLEMS (unusual diarrhea, constipation, pain near the anus) ?TENDERNESS IN MOUTH AND THROAT WITH OR WITHOUT PRESENCE OF ULCERS (sore throat, sores in mouth, or a toothache) ?UNUSUAL RASH, SWELLING OR PAIN  ?UNUSUAL VAGINAL DISCHARGE OR ITCHING  ? ?Items with * indicate a potential emergency and should be followed up as soon as possible or go to the Emergency Department if any problems should occur. ? ?Please show the CHEMOTHERAPY ALERT CARD or IMMUNOTHERAPY ALERT CARD at check-in to the Emergency Department and triage nurse. ? ?Should you have questions after your visit or need to cancel or reschedule your appointment, please contact [facility] [phone]  and  follow the prompts.  Office hours are 8:00 a.m. to 4:30 p.m. Monday - Friday. Please note that voicemails left after 4:00 p.m. may not be returned until the following business day.  We are closed weekends and major holidays. You have access to a nurse at all times for urgent questions. Please call the main number to the clinic [phone] and follow the prompts. ? ?For any non-urgent questions, you may also contact your provider using MyChart. We now offer e-Visits for anyone 18 and older to request care online for non-urgent symptoms. For details visit [email or web address]. ?  ?Also download the MyChart app! Go to the app store, search "MyChart", open the app, select [facility], and log in with your MyChart username and password. ? ?Due to Covid, a mask is required upon entering the hospital/clinic. If you do not have a mask, one will be given to you upon arrival. For doctor visits, patients may have 1 support person aged 18 or older with them. For treatment visits, patients cannot have anyone with them due to current Covid guidelines and our immunocompromised population.  ?

## 2021-06-12 DIAGNOSIS — M961 Postlaminectomy syndrome, not elsewhere classified: Secondary | ICD-10-CM | POA: Diagnosis not present

## 2021-06-12 DIAGNOSIS — G894 Chronic pain syndrome: Secondary | ICD-10-CM | POA: Diagnosis not present

## 2021-06-12 DIAGNOSIS — G4733 Obstructive sleep apnea (adult) (pediatric): Secondary | ICD-10-CM | POA: Diagnosis not present

## 2021-06-12 DIAGNOSIS — M79643 Pain in unspecified hand: Secondary | ICD-10-CM | POA: Diagnosis not present

## 2021-06-12 DIAGNOSIS — M5136 Other intervertebral disc degeneration, lumbar region: Secondary | ICD-10-CM | POA: Diagnosis not present

## 2021-06-19 ENCOUNTER — Ambulatory Visit: Payer: Medicare Other | Admitting: Gastroenterology

## 2021-07-01 DIAGNOSIS — I10 Essential (primary) hypertension: Secondary | ICD-10-CM | POA: Diagnosis not present

## 2021-07-01 DIAGNOSIS — E785 Hyperlipidemia, unspecified: Secondary | ICD-10-CM | POA: Diagnosis not present

## 2021-07-20 DIAGNOSIS — Z79899 Other long term (current) drug therapy: Secondary | ICD-10-CM | POA: Diagnosis not present

## 2021-07-20 DIAGNOSIS — M25519 Pain in unspecified shoulder: Secondary | ICD-10-CM | POA: Diagnosis not present

## 2021-07-20 DIAGNOSIS — M79643 Pain in unspecified hand: Secondary | ICD-10-CM | POA: Diagnosis not present

## 2021-07-20 DIAGNOSIS — Z79891 Long term (current) use of opiate analgesic: Secondary | ICD-10-CM | POA: Diagnosis not present

## 2021-07-20 DIAGNOSIS — M1710 Unilateral primary osteoarthritis, unspecified knee: Secondary | ICD-10-CM | POA: Diagnosis not present

## 2021-07-20 DIAGNOSIS — G894 Chronic pain syndrome: Secondary | ICD-10-CM | POA: Diagnosis not present

## 2021-07-31 DIAGNOSIS — G4733 Obstructive sleep apnea (adult) (pediatric): Secondary | ICD-10-CM | POA: Diagnosis not present

## 2021-07-31 DIAGNOSIS — I10 Essential (primary) hypertension: Secondary | ICD-10-CM | POA: Diagnosis not present

## 2021-08-01 DIAGNOSIS — I10 Essential (primary) hypertension: Secondary | ICD-10-CM | POA: Diagnosis not present

## 2021-08-01 DIAGNOSIS — C20 Malignant neoplasm of rectum: Secondary | ICD-10-CM | POA: Diagnosis not present

## 2021-08-14 ENCOUNTER — Ambulatory Visit
Admission: RE | Admit: 2021-08-14 | Discharge: 2021-08-14 | Disposition: A | Discharge status: Home / Self Care | Payer: Medicare Other | Source: Ambulatory Visit | Attending: Physician Assistant | Admitting: Physician Assistant

## 2021-08-14 ENCOUNTER — Other Ambulatory Visit: Payer: Self-pay | Admitting: Physician Assistant

## 2021-08-14 DIAGNOSIS — M25511 Pain in right shoulder: Secondary | ICD-10-CM

## 2021-08-17 DIAGNOSIS — G5603 Carpal tunnel syndrome, bilateral upper limbs: Secondary | ICD-10-CM | POA: Diagnosis not present

## 2021-08-30 DIAGNOSIS — I10 Essential (primary) hypertension: Secondary | ICD-10-CM | POA: Diagnosis not present

## 2021-08-30 DIAGNOSIS — G4733 Obstructive sleep apnea (adult) (pediatric): Secondary | ICD-10-CM | POA: Diagnosis not present

## 2021-08-31 DIAGNOSIS — I10 Essential (primary) hypertension: Secondary | ICD-10-CM | POA: Diagnosis not present

## 2021-08-31 DIAGNOSIS — E785 Hyperlipidemia, unspecified: Secondary | ICD-10-CM | POA: Diagnosis not present

## 2021-09-06 ENCOUNTER — Encounter (HOSPITAL_COMMUNITY): Payer: Self-pay

## 2021-09-06 ENCOUNTER — Inpatient Hospital Stay (HOSPITAL_COMMUNITY): Payer: Medicare Other | Attending: Hematology

## 2021-09-06 ENCOUNTER — Other Ambulatory Visit: Payer: Self-pay

## 2021-09-06 DIAGNOSIS — Z452 Encounter for adjustment and management of vascular access device: Secondary | ICD-10-CM | POA: Diagnosis not present

## 2021-09-06 DIAGNOSIS — C20 Malignant neoplasm of rectum: Secondary | ICD-10-CM | POA: Insufficient documentation

## 2021-09-06 MED ORDER — HEPARIN SOD (PORK) LOCK FLUSH 100 UNIT/ML IV SOLN
500.0000 [IU] | Freq: Once | INTRAVENOUS | Status: AC
  Administered 2021-09-06: 500 [IU] via INTRAVENOUS

## 2021-09-06 NOTE — Progress Notes (Signed)
[name] presented for Portacath access and flush.  Portacath located in the left chest wall accessed with H 20 needle.  Good blood return present. Portacath flushed with 10 ml NS and 500U/12ml Heparin and needle removed intact.  Procedure tolerated well and without incident. Discharged from clinic ambulatory in stable condition. Alert and oriented x 3. F/U with [facility] as scheduled.

## 2021-09-12 DIAGNOSIS — G4733 Obstructive sleep apnea (adult) (pediatric): Secondary | ICD-10-CM | POA: Diagnosis not present

## 2021-09-12 DIAGNOSIS — I10 Essential (primary) hypertension: Secondary | ICD-10-CM | POA: Diagnosis not present

## 2021-09-14 DIAGNOSIS — M1711 Unilateral primary osteoarthritis, right knee: Secondary | ICD-10-CM | POA: Diagnosis not present

## 2021-09-16 ENCOUNTER — Encounter: Payer: Self-pay | Admitting: Gastroenterology

## 2021-09-16 NOTE — Progress Notes (Deleted)
Primary Care Physician:  Rosita Fire, MD Primary Gastroenterologist:  Dr. Gala Romney  No chief complaint on file.   HPI:   [name] is a 60 y.o. male with history of colon polyps and stage III rectal adenocarcinoma in 2016 s/p segmental resection of rectosigmoid cancer followed by chemoradiation adjuvant therapy, presenting today to discuss scheduling surveillance colonoscopy.  Last colonoscopy September 2019 with diverticulosis in the sigmoid and descending colon, long redundant colon, otherwise normal exam.  Compromised exam due to inadequate preparation with recommendations to repeat in 1 year.  Notably, patient has history of poor preps in the setting of noncompliance with bowel prep instructions.   Today:     Give 4 days of linzess prior and extra 1/2 day of clears.   Past Medical History:  Diagnosis Date   Arthritis    Cancer ([place])    rectal   Depression    Diabetes mellitus    "Borderline"   GERD (gastroesophageal reflux disease)    No weakness   High cholesterol    Hypertension    Neuropathy    "back missed up"   Rectal cancer [facility])    Sleep apnea     Past Surgical History:  Procedure Laterality Date   BACK SURGERY     BIOPSY  03/31/2015   Procedure: BIOPSY;  Surgeon: Daneil Dolin, MD;  Location: [facility];  Service: Endoscopy;;   CHOLECYSTECTOMY     COLONOSCOPY WITH PROPOFOL N/A 03/31/2015   Procedure: COLONOSCOPY WITH PROPOFOL at cecum 0842; withdrawal time=58minutes;  Surgeon: Daneil Dolin, MD;  Location: [facility];  Service: Endoscopy;  Laterality: N/A;   COLONOSCOPY WITH PROPOFOL N/A 05/21/2016   Procedure: COLONOSCOPY WITH PROPOFOL;  Surgeon: Daneil Dolin, MD;  [facility];  Service: Endoscopy;  Laterality: N/A;  200 - moved to 12:45 - office calling pt with 11:15 arrival time   COLONOSCOPY WITH PROPOFOL N/A 07/26/2016   Procedure: COLONOSCOPY WITH PROPOFOL;  Surgeon: Daneil Dolin, MD;  [facility];  Service: Endoscopy;   Laterality: N/A;  115   COLONOSCOPY WITH PROPOFOL N/A 07/28/2018   Procedure: COLONOSCOPY WITH PROPOFOL;  Surgeon: Daneil Dolin, MD;  [facility];  Service: Endoscopy;  Laterality: N/A;  1:45pm - unable to reach pt to move up   [place] 07/05/2015   Procedure: FLEXIBLE SIGMOIDOSCOPY;  Surgeon: [name], MD;  Location: [facility];  Service: Endoscopy;  Laterality: N/A;  with tattoo   POLYPECTOMY  03/31/2015   Procedure: POLYPECTOMY;  Surgeon: Daneil Dolin, MD;  Location: [facility];  Service: Endoscopy;;   PORTACATH PLACEMENT N/A 08/09/2015   Procedure: INSERTION PORT-A-CATH LEFT SUBCLAVIAN;  Surgeon: [name], MD;  Location: [place];  Service: General;  Laterality: N/A;   XI ROBOTIC ASSISTED LOWER ANTERIOR RESECTION N/A 07/06/2015   Procedure: XI ROBOTIC ASSISTED LOWER ANTERIOR RESECTION, rigid proctoscopy;  Surgeon: [name], MD;  Location: [facility];  Service: General;  Laterality: N/A;    Current Outpatient Medications  Medication Sig Dispense Refill   ACCU-CHEK FASTCLIX LANCETS MISC      ACCU-CHEK SMARTVIEW test strip      Alcohol Swabs (B-D SINGLE USE SWABS REGULAR) PADS      amLODipine (NORVASC) 10 MG tablet amlodipine 10 mg tablet  TAKE 1 TABLET BY MOUTH ONCE DAILY     amLODipine (NORVASC) 5 MG tablet Take 1 tablet by mouth daily.     diclofenac Sodium (VOLTAREN) 1 % GEL SMARTSIG:4  Gram(s) Topical 3 Times Daily PRN     gabapentin (NEURONTIN) 800 MG tablet Take 1 tablet (800 mg total) by mouth 3 (three) times daily. (Patient taking differently: Take 800 mg by mouth 2 (two) times daily.) 90 tablet 2   lactulose (CHRONULAC) 10 GM/15ML solution Constulose 10 gram/15 mL oral solution  TAKE 30 ML BY MOUTH EVERY 3 HOURS UNTIL YOU HAVE A BOWEL MOVEMENT THEN START TAKING 30 MLS AT BEDTIME     lisinopril (PRINIVIL,ZESTRIL) 40 MG tablet Take 1 tablet (40 mg total) by mouth every morning. 30 tablet 2   naloxone (NARCAN) nasal spray 4 mg/0.1 mL  SMARTSIG:Spray(s) In Nostril     NON FORMULARY PT HAS A C-PAP MACHINE     nortriptyline (PAMELOR) 75 MG capsule Take 1 capsule (75 mg total) by mouth at bedtime. 30 capsule 2   oxyCODONE-acetaminophen (PERCOCET) 10-325 MG tablet Take 1 tablet by mouth 5 (five) times daily.     tiZANidine (ZANAFLEX) 2 MG tablet Take 1 tablet (2 mg total) by mouth every 8 (eight) hours as needed for muscle spasms. (Patient taking differently: Take 2 mg by mouth 2 (two) times daily.) 30 tablet 2   No current facility-administered medications for this visit.    Allergies as of 09/18/2021   (No Known Allergies)    Family History  Problem Relation Age of Onset   Hypertension Other        "Not sure who, I don't know much about my family"   Diabetes Other        "Not sure who, I don't know much about my family"   Colon cancer Neg Hx        "Not that I know of"   Colon polyps Neg Hx        "not that I know of"    Social History   Socioeconomic History   Marital status: Divorced    Spouse name: Not on file   Number of children: Not on file   Years of education: Not on file   Highest education level: Not on file  Occupational History   Not on file  Tobacco Use   Smoking status: Former    Packs/day: 0.25    Years: 2.00    Pack years: 0.50    Types: Cigarettes    Quit date: 03/23/2006    Years since quitting: 15.4   Smokeless tobacco: Never  Vaping Use   Vaping Use: Never used  Substance and Sexual Activity   Alcohol use: No    Alcohol/week: 0.0 standard drinks   Drug use: No   Sexual activity: Not Currently  Other Topics Concern   Not on file  Social History Narrative   Not on file   Social Determinants of Health   Financial Resource Strain: Low Risk    Difficulty of Paying Living Expenses: Not hard at all  Food Insecurity: No Food Insecurity   Worried About Charity fundraiser in the Last Year: Never true   [place] in the Last Year: Never true  Transportation Needs: No  Transportation Needs   Lack of Transportation (Medical): No   Lack of Transportation (Non-Medical): No  Physical Activity: Inactive   Days of Exercise per Week: 0 days   Minutes of Exercise per Session: 0 min  Stress: No Stress Concern Present   Feeling of Stress : Not at all  Social Connections: Socially Isolated   Frequency of Communication with Friends and Family: More than three  times a week   Frequency of Social Gatherings with Friends and Family: More than three times a week   Attends Religious Services: Never   Marine scientist or Organizations: No   Attends Music therapist: Never   Marital Status: Divorced  Human resources officer Violence: Not At Risk   Fear of Current or Ex-Partner: No   Emotionally Abused: No   Physically Abused: No   Sexually Abused: No    Review of Systems: Gen: Denies any fever, chills, fatigue, weight loss, lack of appetite.  CV: Denies chest pain, heart palpitations, peripheral edema, syncope.  Resp: Denies shortness of breath at rest or with exertion. Denies wheezing or cough.  GI: Denies dysphagia or odynophagia. Denies jaundice, hematemesis, fecal incontinence. GU : Denies urinary burning, urinary frequency, urinary hesitancy MS: Denies joint pain, muscle weakness, cramps, or limitation of movement.  Derm: Denies rash, itching, dry skin Psych: Denies depression, anxiety, memory loss, and confusion Heme: Denies bruising, bleeding, and enlarged lymph nodes.  Physical Exam: There were no vitals taken for this visit. General:   Alert and oriented. Pleasant and cooperative. Well-nourished and well-developed.  Head:  Normocephalic and atraumatic. Eyes:  Without icterus, sclera clear and conjunctiva pink.  Ears:  Normal auditory acuity. Nose:  No deformity, discharge,  or lesions. Mouth:  No deformity or lesions, oral mucosa pink.  Neck:  Supple, without mass or thyromegaly. Lungs:  Clear to auscultation bilaterally. No wheezes,  rales, or rhonchi. No distress.  Heart:  S1, S2 present without murmurs appreciated.  Abdomen:  +BS, soft, non-tender and non-distended. No HSM noted. No guarding or rebound. No masses appreciated.  Rectal:  Deferred  Msk:  Symmetrical without gross deformities. Normal posture. Pulses:  Normal pulses noted. Extremities:  Without clubbing or edema. Neurologic:  Alert and  oriented x4;  grossly normal neurologically. Skin:  Intact without significant lesions or rashes. Cervical Nodes:  No significant cervical adenopathy. Psych:  Alert and cooperative. Normal mood and affect.

## 2021-09-18 ENCOUNTER — Encounter: Payer: Self-pay | Admitting: Internal Medicine

## 2021-09-18 ENCOUNTER — Ambulatory Visit: Payer: Medicare Other | Admitting: Gastroenterology

## 2021-09-22 DIAGNOSIS — I10 Essential (primary) hypertension: Secondary | ICD-10-CM | POA: Diagnosis not present

## 2021-09-22 DIAGNOSIS — G63 Polyneuropathy in diseases classified elsewhere: Secondary | ICD-10-CM | POA: Diagnosis not present

## 2021-09-22 DIAGNOSIS — G4733 Obstructive sleep apnea (adult) (pediatric): Secondary | ICD-10-CM | POA: Diagnosis not present

## 2021-09-30 DIAGNOSIS — I10 Essential (primary) hypertension: Secondary | ICD-10-CM | POA: Diagnosis not present

## 2021-09-30 DIAGNOSIS — G4733 Obstructive sleep apnea (adult) (pediatric): Secondary | ICD-10-CM | POA: Diagnosis not present

## 2021-10-16 DIAGNOSIS — M5417 Radiculopathy, lumbosacral region: Secondary | ICD-10-CM | POA: Diagnosis not present

## 2021-10-16 DIAGNOSIS — M19019 Primary osteoarthritis, unspecified shoulder: Secondary | ICD-10-CM | POA: Diagnosis not present

## 2021-10-16 DIAGNOSIS — G894 Chronic pain syndrome: Secondary | ICD-10-CM | POA: Diagnosis not present

## 2021-10-16 DIAGNOSIS — M961 Postlaminectomy syndrome, not elsewhere classified: Secondary | ICD-10-CM | POA: Diagnosis not present

## 2021-10-22 DIAGNOSIS — I10 Essential (primary) hypertension: Secondary | ICD-10-CM | POA: Diagnosis not present

## 2021-10-22 DIAGNOSIS — G63 Polyneuropathy in diseases classified elsewhere: Secondary | ICD-10-CM | POA: Diagnosis not present

## 2021-10-30 DIAGNOSIS — I10 Essential (primary) hypertension: Secondary | ICD-10-CM | POA: Diagnosis not present

## 2021-10-30 DIAGNOSIS — G4733 Obstructive sleep apnea (adult) (pediatric): Secondary | ICD-10-CM | POA: Diagnosis not present

## 2021-11-16 DIAGNOSIS — G894 Chronic pain syndrome: Secondary | ICD-10-CM | POA: Diagnosis not present

## 2021-11-16 DIAGNOSIS — M5136 Other intervertebral disc degeneration, lumbar region: Secondary | ICD-10-CM | POA: Diagnosis not present

## 2021-11-16 DIAGNOSIS — M1710 Unilateral primary osteoarthritis, unspecified knee: Secondary | ICD-10-CM | POA: Diagnosis not present

## 2021-11-16 DIAGNOSIS — M961 Postlaminectomy syndrome, not elsewhere classified: Secondary | ICD-10-CM | POA: Diagnosis not present

## 2021-11-22 DIAGNOSIS — I10 Essential (primary) hypertension: Secondary | ICD-10-CM | POA: Diagnosis not present

## 2021-11-22 DIAGNOSIS — R7303 Prediabetes: Secondary | ICD-10-CM | POA: Diagnosis not present

## 2021-11-27 DIAGNOSIS — R69 Illness, unspecified: Secondary | ICD-10-CM | POA: Diagnosis not present

## 2021-11-30 DIAGNOSIS — I10 Essential (primary) hypertension: Secondary | ICD-10-CM | POA: Diagnosis not present

## 2021-11-30 DIAGNOSIS — G4733 Obstructive sleep apnea (adult) (pediatric): Secondary | ICD-10-CM | POA: Diagnosis not present

## 2021-12-07 ENCOUNTER — Inpatient Hospital Stay (HOSPITAL_COMMUNITY): Payer: Medicare Other

## 2021-12-07 DIAGNOSIS — R69 Illness, unspecified: Secondary | ICD-10-CM | POA: Diagnosis not present

## 2021-12-14 ENCOUNTER — Inpatient Hospital Stay (HOSPITAL_COMMUNITY): Payer: Medicare Other

## 2021-12-14 DIAGNOSIS — R69 Illness, unspecified: Secondary | ICD-10-CM | POA: Diagnosis not present

## 2021-12-22 ENCOUNTER — Inpatient Hospital Stay (HOSPITAL_COMMUNITY): Payer: Medicare Other | Attending: Hematology

## 2021-12-22 ENCOUNTER — Other Ambulatory Visit: Payer: Self-pay

## 2021-12-22 VITALS — BP 122/81 | HR 88 | Temp 96.9°F | Resp 20

## 2021-12-22 DIAGNOSIS — Z85048 Personal history of other malignant neoplasm of rectum, rectosigmoid junction, and anus: Secondary | ICD-10-CM | POA: Diagnosis not present

## 2021-12-22 DIAGNOSIS — Z452 Encounter for adjustment and management of vascular access device: Secondary | ICD-10-CM | POA: Insufficient documentation

## 2021-12-22 DIAGNOSIS — Z95828 Presence of other vascular implants and grafts: Secondary | ICD-10-CM

## 2021-12-22 MED ORDER — SODIUM CHLORIDE 0.9% FLUSH
10.0000 mL | Freq: Once | INTRAVENOUS | Status: AC
  Administered 2021-12-22: 10 mL via INTRAVENOUS

## 2021-12-22 MED ORDER — HEPARIN SOD (PORK) LOCK FLUSH 100 UNIT/ML IV SOLN
500.0000 [IU] | Freq: Once | INTRAVENOUS | Status: AC
  Administered 2021-12-22: 500 [IU] via INTRAVENOUS

## 2021-12-22 NOTE — Patient Instructions (Signed)
[facility]  Discharge Instructions: ?Thank you for choosing [facility] to provide your oncology and hematology care.  ?If you have a lab appointment with the Cancer Center, please come in thru the Main Entrance and check in at the main information desk. ? ? ? ?We strive to give you quality time with your provider. You may need to reschedule your appointment if you arrive late (15 or more minutes).  Arriving late affects you and other patients whose appointments are after yours.  Also, if you miss three or more appointments without notifying the office, you may be dismissed from the clinic at the provider?s discretion.    ?  ?For prescription refill requests, have your pharmacy contact our office and allow 72 hours for refills to be completed.   ? ?  ?To help prevent nausea and vomiting after your treatment, we encourage you to take your nausea medication as directed. ? ?BELOW ARE SYMPTOMS THAT SHOULD BE REPORTED IMMEDIATELY: ?*FEVER GREATER THAN 100.4 F (38 ?C) OR HIGHER ?*CHILLS OR SWEATING ?*NAUSEA AND VOMITING THAT IS NOT CONTROLLED WITH YOUR NAUSEA MEDICATION ?*UNUSUAL SHORTNESS OF BREATH ?*UNUSUAL BRUISING OR BLEEDING ?*URINARY PROBLEMS (pain or burning when urinating, or frequent urination) ?*BOWEL PROBLEMS (unusual diarrhea, constipation, pain near the anus) ?TENDERNESS IN MOUTH AND THROAT WITH OR WITHOUT PRESENCE OF ULCERS (sore throat, sores in mouth, or a toothache) ?UNUSUAL RASH, SWELLING OR PAIN  ?UNUSUAL VAGINAL DISCHARGE OR ITCHING  ? ?Items with * indicate a potential emergency and should be followed up as soon as possible or go to the Emergency Department if any problems should occur. ? ?Please show the CHEMOTHERAPY ALERT CARD or IMMUNOTHERAPY ALERT CARD at check-in to the Emergency Department and triage nurse. ? ?Should you have questions after your visit or need to cancel or reschedule your appointment, please contact  CANCER CENTER [phone]  and follow  the prompts.  Office hours are 8:00 a.m. to 4:30 p.m. Monday - Friday. Please note that voicemails left after 4:00 p.m. may not be returned until the following business day.  We are closed weekends and major holidays. You have access to a nurse at all times for urgent questions. Please call the main number to the clinic [phone] and follow the prompts. ? ?For any non-urgent questions, you may also contact your provider using MyChart. We now offer e-Visits for anyone 18 and older to request care online for non-urgent symptoms. For details visit [email or web address]. ?  ?Also download the MyChart app! Go to the app store, search "MyChart", open the app, select [facility], and log in with your MyChart username and password. ? ?Due to Covid, a mask is required upon entering the hospital/clinic. If you do not have a mask, one will be given to you upon arrival. For doctor visits, patients may have 1 support person aged 18 or older with them. For treatment visits, patients cannot have anyone with them due to current Covid guidelines and our immunocompromised population.  ?

## 2021-12-23 DIAGNOSIS — C20 Malignant neoplasm of rectum: Secondary | ICD-10-CM | POA: Diagnosis not present

## 2021-12-23 DIAGNOSIS — I10 Essential (primary) hypertension: Secondary | ICD-10-CM | POA: Diagnosis not present

## 2021-12-31 DIAGNOSIS — G4733 Obstructive sleep apnea (adult) (pediatric): Secondary | ICD-10-CM | POA: Diagnosis not present

## 2021-12-31 DIAGNOSIS — I10 Essential (primary) hypertension: Secondary | ICD-10-CM | POA: Diagnosis not present

## 2022-01-01 DIAGNOSIS — M5136 Other intervertebral disc degeneration, lumbar region: Secondary | ICD-10-CM | POA: Diagnosis not present

## 2022-01-01 DIAGNOSIS — G894 Chronic pain syndrome: Secondary | ICD-10-CM | POA: Diagnosis not present

## 2022-01-01 DIAGNOSIS — M961 Postlaminectomy syndrome, not elsewhere classified: Secondary | ICD-10-CM | POA: Diagnosis not present

## 2022-01-01 DIAGNOSIS — M1710 Unilateral primary osteoarthritis, unspecified knee: Secondary | ICD-10-CM | POA: Diagnosis not present

## 2022-01-20 DIAGNOSIS — I10 Essential (primary) hypertension: Secondary | ICD-10-CM | POA: Diagnosis not present

## 2022-01-20 DIAGNOSIS — M549 Dorsalgia, unspecified: Secondary | ICD-10-CM | POA: Diagnosis not present

## 2022-01-28 DIAGNOSIS — G4733 Obstructive sleep apnea (adult) (pediatric): Secondary | ICD-10-CM | POA: Diagnosis not present

## 2022-01-28 DIAGNOSIS — I10 Essential (primary) hypertension: Secondary | ICD-10-CM | POA: Diagnosis not present

## 2022-01-30 DIAGNOSIS — G894 Chronic pain syndrome: Secondary | ICD-10-CM | POA: Diagnosis not present

## 2022-01-30 DIAGNOSIS — Z79891 Long term (current) use of opiate analgesic: Secondary | ICD-10-CM | POA: Diagnosis not present

## 2022-01-31 ENCOUNTER — Other Ambulatory Visit (HOSPITAL_COMMUNITY): Payer: Self-pay | Admitting: Nurse Practitioner

## 2022-01-31 DIAGNOSIS — M5417 Radiculopathy, lumbosacral region: Secondary | ICD-10-CM

## 2022-02-19 ENCOUNTER — Ambulatory Visit (HOSPITAL_COMMUNITY)
Admission: RE | Admit: 2022-02-19 | Discharge: 2022-02-19 | Disposition: A | Discharge status: Home / Self Care | Payer: Medicare Other | Source: Ambulatory Visit | Attending: Nurse Practitioner | Admitting: Nurse Practitioner

## 2022-02-19 DIAGNOSIS — M545 Low back pain, unspecified: Secondary | ICD-10-CM | POA: Diagnosis not present

## 2022-02-19 DIAGNOSIS — M5417 Radiculopathy, lumbosacral region: Secondary | ICD-10-CM | POA: Diagnosis not present

## 2022-02-19 DIAGNOSIS — M47816 Spondylosis without myelopathy or radiculopathy, lumbar region: Secondary | ICD-10-CM | POA: Diagnosis not present

## 2022-02-20 DIAGNOSIS — M549 Dorsalgia, unspecified: Secondary | ICD-10-CM | POA: Diagnosis not present

## 2022-02-20 DIAGNOSIS — I10 Essential (primary) hypertension: Secondary | ICD-10-CM | POA: Diagnosis not present

## 2022-02-26 DIAGNOSIS — I10 Essential (primary) hypertension: Secondary | ICD-10-CM | POA: Diagnosis not present

## 2022-02-26 DIAGNOSIS — R269 Unspecified abnormalities of gait and mobility: Secondary | ICD-10-CM | POA: Diagnosis not present

## 2022-02-26 DIAGNOSIS — G4733 Obstructive sleep apnea (adult) (pediatric): Secondary | ICD-10-CM | POA: Diagnosis not present

## 2022-02-28 DIAGNOSIS — I10 Essential (primary) hypertension: Secondary | ICD-10-CM | POA: Diagnosis not present

## 2022-02-28 DIAGNOSIS — M1711 Unilateral primary osteoarthritis, right knee: Secondary | ICD-10-CM | POA: Diagnosis not present

## 2022-02-28 DIAGNOSIS — G4733 Obstructive sleep apnea (adult) (pediatric): Secondary | ICD-10-CM | POA: Diagnosis not present

## 2022-02-28 DIAGNOSIS — G894 Chronic pain syndrome: Secondary | ICD-10-CM | POA: Diagnosis not present

## 2022-03-06 ENCOUNTER — Inpatient Hospital Stay (HOSPITAL_COMMUNITY): Payer: Medicare Other | Attending: Hematology

## 2022-03-06 DIAGNOSIS — Z79899 Other long term (current) drug therapy: Secondary | ICD-10-CM | POA: Diagnosis not present

## 2022-03-06 DIAGNOSIS — C20 Malignant neoplasm of rectum: Secondary | ICD-10-CM

## 2022-03-06 DIAGNOSIS — Z87891 Personal history of nicotine dependence: Secondary | ICD-10-CM | POA: Insufficient documentation

## 2022-03-06 DIAGNOSIS — Z85048 Personal history of other malignant neoplasm of rectum, rectosigmoid junction, and anus: Secondary | ICD-10-CM | POA: Diagnosis not present

## 2022-03-06 DIAGNOSIS — Z9221 Personal history of antineoplastic chemotherapy: Secondary | ICD-10-CM | POA: Diagnosis not present

## 2022-03-06 DIAGNOSIS — Z923 Personal history of irradiation: Secondary | ICD-10-CM | POA: Diagnosis not present

## 2022-03-06 DIAGNOSIS — Z452 Encounter for adjustment and management of vascular access device: Secondary | ICD-10-CM | POA: Diagnosis not present

## 2022-03-06 LAB — COMPREHENSIVE METABOLIC PANEL
ALT: 71 U/L — ABNORMAL HIGH (ref 0–44)
AST: 67 U/L — ABNORMAL HIGH (ref 15–41)
Albumin: 3.9 g/dL (ref 3.5–5.0)
Alkaline Phosphatase: 64 U/L (ref 38–126)
Anion gap: 9 (ref 5–15)
BUN: 12 mg/dL (ref 6–20)
CO2: 24 mmol/L (ref 22–32)
Calcium: 9 mg/dL (ref 8.9–10.3)
Chloride: 107 mmol/L (ref 98–111)
Creatinine, Ser: 1.16 mg/dL (ref 0.61–1.24)
GFR, Estimated: >60 mL/min (ref >60)
Glucose, Bld: 140 mg/dL — ABNORMAL HIGH (ref 70–99)
Potassium: 3.7 mmol/L (ref 3.5–5.1)
Sodium: 140 mmol/L (ref 135–145)
Total Bilirubin: 0.8 mg/dL (ref 0.3–1.2)
Total Protein: 7.6 g/dL (ref 6.5–8.1)

## 2022-03-06 LAB — CBC WITH DIFFERENTIAL/PLATELET
Abs Immature Granulocytes: 0.03 10*3/uL (ref 0.00–0.07)
Basophils Absolute: 0 10*3/uL (ref 0.0–0.1)
Basophils Relative: 1 %
Eosinophils Absolute: 0.3 10*3/uL (ref 0.0–0.5)
Eosinophils Relative: 8 %
HCT: 40.5 % (ref 39.0–52.0)
Hemoglobin: 12.7 g/dL — ABNORMAL LOW (ref 13.0–17.0)
Immature Granulocytes: 1 %
Lymphocytes Relative: 27 %
Lymphs Abs: 1.2 10*3/uL (ref 0.7–4.0)
MCH: 28.7 pg (ref 26.0–34.0)
MCHC: 31.4 g/dL (ref 30.0–36.0)
MCV: 91.4 fL (ref 80.0–100.0)
Monocytes Absolute: 0.4 10*3/uL (ref 0.1–1.0)
Monocytes Relative: 10 %
Neutro Abs: 2.3 10*3/uL (ref 1.7–7.7)
Neutrophils Relative %: 53 %
Platelets: 157 10*3/uL (ref 150–400)
RBC: 4.43 MIL/uL (ref 4.22–5.81)
RDW: 12.8 % (ref 11.5–15.5)
WBC: 4.2 10*3/uL (ref 4.0–10.5)
nRBC: 0 % (ref 0.0–0.2)

## 2022-03-06 LAB — LACTATE DEHYDROGENASE: LDH: 167 U/L (ref 98–192)

## 2022-03-06 LAB — MAGNESIUM: Magnesium: 1.8 mg/dL (ref 1.7–2.4)

## 2022-03-06 MED ORDER — SODIUM CHLORIDE 0.9% FLUSH
10.0000 mL | Freq: Once | INTRAVENOUS | Status: AC
  Administered 2022-03-06: 10 mL via INTRAVENOUS

## 2022-03-06 MED ORDER — HEPARIN SOD (PORK) LOCK FLUSH 100 UNIT/ML IV SOLN
500.0000 [IU] | Freq: Once | INTRAVENOUS | Status: AC
  Administered 2022-03-06: 500 [IU] via INTRAVENOUS

## 2022-03-06 NOTE — Patient Instructions (Signed)
[facility]  Discharge Instructions: ?Thank you for choosing [facility] to provide your oncology and hematology care.  ?If you have a lab appointment with the [facility], please come in thru the Main Entrance and check in at the main information desk. ? ?Wear comfortable clothing and clothing appropriate for easy access to any Portacath or PICC line.  ? ?We strive to give you quality time with your provider. You may need to reschedule your appointment if you arrive late (15 or more minutes).  Arriving late affects you and other patients whose appointments are after yours.  Also, if you miss three or more appointments without notifying the office, you may be dismissed from the clinic at the provider?s discretion.    ?  ?For prescription refill requests, have your pharmacy contact our office and allow 72 hours for refills to be completed.   ? ?Today you received the following chemotherapy and/or immunotherapy agents PORT flush with labs    ?  ?To help prevent nausea and vomiting after your treatment, we encourage you to take your nausea medication as directed. ? ?BELOW ARE SYMPTOMS THAT SHOULD BE REPORTED IMMEDIATELY: ?*FEVER GREATER THAN 100.4 F (38 ?C) OR HIGHER ?*CHILLS OR SWEATING ?*NAUSEA AND VOMITING THAT IS NOT CONTROLLED WITH YOUR NAUSEA MEDICATION ?*UNUSUAL SHORTNESS OF BREATH ?*UNUSUAL BRUISING OR BLEEDING ?*URINARY PROBLEMS (pain or burning when urinating, or frequent urination) ?*BOWEL PROBLEMS (unusual diarrhea, constipation, pain near the anus) ?TENDERNESS IN MOUTH AND THROAT WITH OR WITHOUT PRESENCE OF ULCERS (sore throat, sores in mouth, or a toothache) ?UNUSUAL RASH, SWELLING OR PAIN  ?UNUSUAL VAGINAL DISCHARGE OR ITCHING  ? ?Items with * indicate a potential emergency and should be followed up as soon as possible or go to the Emergency Department if any problems should occur. ? ?Please show the CHEMOTHERAPY ALERT CARD or IMMUNOTHERAPY ALERT CARD at check-in to the  Emergency Department and triage nurse. ? ?Should you have questions after your visit or need to cancel or reschedule your appointment, please contact [facility] [phone]  and follow the prompts.  Office hours are 8:00 a.m. to 4:30 p.m. Monday - Friday. Please note that voicemails left after 4:00 p.m. may not be returned until the following business day.  We are closed weekends and major holidays. You have access to a nurse at all times for urgent questions. Please call the main number to the clinic [phone] and follow the prompts. ? ?For any non-urgent questions, you may also contact your provider using MyChart. We now offer e-Visits for anyone 42 and older to request care online for non-urgent symptoms. For details visit [email or web address]. ?  ?Also download the MyChart app! Go to the app store, search "MyChart", open the app, select [facility], and log in with your MyChart username and password. ? ?Due to Covid, a mask is required upon entering the hospital/clinic. If you do not have a mask, one will be given to you upon arrival. For doctor visits, patients may have 1 support person aged 59 or older with them. For treatment visits, patients cannot have anyone with them due to current Covid guidelines and our immunocompromised population.  ?

## 2022-03-06 NOTE — Progress Notes (Signed)
Patients port flushed without difficulty.  Good blood return noted with no bruising or swelling noted at site.  Stable during access and labs draw.  Band aid applied.  VSS with discharge and left in satisfactory condition with no s/s of distress noted.   ?

## 2022-03-07 LAB — CEA: CEA: 5.9 ng/mL — ABNORMAL HIGH (ref 0.0–4.7)

## 2022-03-08 DIAGNOSIS — M5451 Vertebrogenic low back pain: Secondary | ICD-10-CM | POA: Diagnosis not present

## 2022-03-13 ENCOUNTER — Inpatient Hospital Stay (HOSPITAL_BASED_OUTPATIENT_CLINIC_OR_DEPARTMENT_OTHER): Payer: Medicare Other | Admitting: Hematology

## 2022-03-13 VITALS — BP 123/74 | HR 104 | Temp 97.4°F | Resp 19 | Wt 212.6 lb

## 2022-03-13 DIAGNOSIS — Z9221 Personal history of antineoplastic chemotherapy: Secondary | ICD-10-CM | POA: Diagnosis not present

## 2022-03-13 DIAGNOSIS — C20 Malignant neoplasm of rectum: Secondary | ICD-10-CM

## 2022-03-13 DIAGNOSIS — Z923 Personal history of irradiation: Secondary | ICD-10-CM | POA: Diagnosis not present

## 2022-03-13 DIAGNOSIS — M5451 Vertebrogenic low back pain: Secondary | ICD-10-CM | POA: Diagnosis not present

## 2022-03-13 DIAGNOSIS — Z87891 Personal history of nicotine dependence: Secondary | ICD-10-CM | POA: Diagnosis not present

## 2022-03-13 DIAGNOSIS — Z452 Encounter for adjustment and management of vascular access device: Secondary | ICD-10-CM | POA: Diagnosis not present

## 2022-03-13 DIAGNOSIS — Z79899 Other long term (current) drug therapy: Secondary | ICD-10-CM | POA: Diagnosis not present

## 2022-03-13 DIAGNOSIS — Z85048 Personal history of other malignant neoplasm of rectum, rectosigmoid junction, and anus: Secondary | ICD-10-CM | POA: Diagnosis not present

## 2022-03-13 NOTE — Patient Instructions (Signed)
[facility] [place] at [facility] ?Discharge Instructions ? ?You were seen and examined today by Dr. Delton Coombes. ? ?Dr. Delton Coombes discussed your most recent lab work and everything looks good except your liver number is high.  ? ?Follow up with Dr. Sydell Axon for your Colonoscopy. ? ?Please keep follow-up appointments as scheduled in 1 year. ? ? ? ?Thank you for choosing [facility] at [facility] to provide your oncology and hematology care.  To afford each patient quality time with our provider, please arrive at least 15 minutes before your scheduled appointment time.  ? ?If you have a lab appointment with the [facility] please come in thru the Main Entrance and check in at the main information desk. ? ?You need to re-schedule your appointment should you arrive 10 or more minutes late.  We strive to give you quality time with our providers, and arriving late affects you and other patients whose appointments are after yours.  Also, if you no show three or more times for appointments you may be dismissed from the clinic at the providers discretion.     ?Again, thank you for choosing [facility].  Our hope is that these requests will decrease the amount of time that you wait before being seen by our physicians.       ?_____________________________________________________________ ? ?Should you have questions after your visit to [facility], please contact our office at [phone] and follow the prompts.  Our office hours are 8:00 a.m. and 4:30 p.m. Monday - Friday.  Please note that voicemails left after 4:00 p.m. may not be returned until the following business day.  We are closed weekends and major holidays.  You do have access to a nurse 24-7, just call the main number to the clinic [phone] and do not press any options, hold on the line and a nurse will answer the phone.   ? ?For prescription refill requests, have your pharmacy contact our  office and allow 72 hours.   ? ?Due to Covid, you will need to wear a mask upon entering the hospital. If you do not have a mask, a mask will be given to you at the Main Entrance upon arrival. For doctor visits, patients may have 1 support person age 80 or older with them. For treatment visits, patients can not have anyone with them due to social distancing guidelines and our immunocompromised population.  ? ?  ?

## 2022-03-13 NOTE — Progress Notes (Signed)
? ?[facility] ?[address] ?[place], [place] [address] ? ? ?CLINIC:  ?Medical Oncology/Hematology ? ?PCP:  ?Carrolyn Meiers, MD ?[place] / [place] [address] ?[phone] ? ? ?REASON FOR VISIT:  ?Follow-up for rectal cancer ? ?PRIOR THERAPY:  ?1. Segmental resection of rectosigmoid on 07/06/2015. ?2. FOLFOX x 6 cycles from 08/15/2015 to 11/06/2015. ?3. Concurrent chemoradiation with 5-FU from 12/05/2015 to 01/16/2016 and x 2 cycles of FOLFOX consolidation to 02/13/2016 ? ?NGS Results: not done ? ?CURRENT THERAPY: surveillance ? ?BRIEF ONCOLOGIC HISTORY:  ?Oncology History  ?Rectal cancer ([name])  ?03/31/2015 Initial Biopsy  ? Rectum, biopsy, rectal mass - INVASIVE ADENOCARCINOMA ? ?  ?06/02/2015 Tumor Marker  ? Results for KRISH, BAILLY (MRN [id number]) as of 08/28/2015 09:09  06/02/2015 16:00 CEA: 4.3  ? ?  ?06/08/2015 Imaging  ? CT abd/pelvis-Rectal primary, without evidence of metastatic disease or acute complication. ? ?  ?06/24/2015 Imaging  ? CT chest- No specific features identified to suggest metastatic disease to the chest. ? ?  ?07/06/2015 Definitive Surgery  ? Robotic-assisted lower anterior resrction, rigid proctoscopy [name])- INVASIVE ADENOCARCINOMA, 2 cm, TUMOR INVADES INTO SUBMUCOSA. LVI/PNI (+).  1/14 LN (+). Margins neg.  ? ?  ?07/06/2015 Miscellaneous  ? IHC Tumor Expression Results normal.  Negative for MLH1, MSH2, MSH6, & PMS2.  ? ?  ?07/06/2015 Pathologic Stage  ? pT1, pN1a, pMx ? ?  ?08/15/2015 - 11/16/2015 Chemotherapy  ? FOLFOX x 6 cycles (adjuvant chemo "phase 1") ? ?  ?09/26/2015 Treatment Plan Change  ? Treatment held today.  5FU bolus D/C'd.  Neulasta OnPro added to each subsequent cycles of therapy. ? ?  ?10/04/2015 Treatment Plan Change  ? Neulasta onpro added to all subsequent cycles of treatment. ? ?  ?10/18/2015 Treatment Plan Change  ? Tx held for thrombocytopenia.  5FU CI is reduced by 20% for subsequent treatments. ? ?  ?12/05/2015 - 01/16/2016  Chemotherapy  ? 5FU continuous infusion weekly x 6 cycles; concurrent radiation. (adjuvant chemo "phase 2") ? ?  ?12/05/2015 - 01/18/2016 Radiation Therapy  ? Pelvic radiation completed in [facility]).  Pelvis 45 Gy in 25 fractions.  Then cone down boost treatment for additional 5.4 Gy in 8 fractions.  Total dose: 50.4 Gy in 33 treatments ? ?  ?01/30/2016 - 02/13/2016 Chemotherapy  ? FOLFOX x 2 cycles to complete 6 months of adjuvant chemotherapy.  (adjuvant chemo "phase 3") ? ?  ?05/21/2016 Procedure  ? Colonoscopy by Dr. Gala Romney- The procedure was aborted due to inadequate bowel prep. Patient readily admits to eating at least applesauce yesterday which was in conflict with our written and verbal preparation instructions. No specimens collected. ? ?  ?06/19/2016 Imaging  ? CT abd/pelvis- Postsurgical changes of the rectum. New presacral soft tissue thickening likely post treatment related. Recommend attention on followup. Large amount of stool in the colon compatible with constipation. No evidence for distant metastatic dz. ? ?  ? Imaging  ? CT abd/pelvis: ?IMPRESSION: ?Stable exam. No evidence of recurrent for metastatic carcinoma ?within the abdomen or pelvis. ?  ?Mild decrease in large colonic stool burden since prior study. No ?acute findings ? ?  ? ? ?CANCER STAGING: ?Cancer Staging  ?Rectal cancer ([place]) ?Staging form: Colon and Rectum, AJCC 7th Edition ?- Pathologic stage from 09/12/2015: Stage IIIA (T1, N1a, cM0) - Signed by [name], PA-C on 09/25/2015 ?- Clinical stage from 09/26/2015: Stage IIIA (T1, N1a, M0) - Signed by [name], PA-C  on 09/25/2015 ? ? ?INTERVAL HISTORY:  ?[name], a 61 y.o. male, returns for routine follow-up of his rectal cancer. [name] was last seen on 03/07/2021.  ? ?Today he reports feeling good. He reports numbness in his feet which is improved when walking. He denies change in BM, hematochezia, and black stools.  ? ?REVIEW OF SYSTEMS:  ?Review of Systems   ?Constitutional:  Negative for appetite change and fatigue.  ?Gastrointestinal:  Negative for blood in stool, constipation and diarrhea.  ?Neurological:  Positive for numbness.  ?Psychiatric/Behavioral:  Positive for depression.   ?All other systems reviewed and are negative. ? ?PAST MEDICAL/SURGICAL HISTORY:  ?Past Medical History:  ?Diagnosis Date  ? Arthritis   ? Cancer [facility])   ? rectal  ? Depression   ? Diabetes mellitus   ? "Borderline"  ? GERD (gastroesophageal reflux disease)   ? No weakness  ? High cholesterol   ? Hypertension   ? Neuropathy   ? "back missed up"  ? Rectal cancer ([name])   ? Sleep apnea   ? ?Past Surgical History:  ?Procedure Laterality Date  ? BACK SURGERY    ? BIOPSY  03/31/2015  ? Procedure: BIOPSY;  Surgeon: Daneil Dolin, MD;  [facility];  Service: Endoscopy;;  ? CHOLECYSTECTOMY    ? COLONOSCOPY WITH PROPOFOL N/A 03/31/2015  ? Procedure: COLONOSCOPY WITH PROPOFOL at cecum 0842; withdrawal time=59minutes;  Surgeon: Daneil Dolin, MD;  Location: [facility];  Service: Endoscopy;  Laterality: N/A;  ? COLONOSCOPY WITH PROPOFOL N/A 05/21/2016  ? Procedure: COLONOSCOPY WITH PROPOFOL;  Surgeon: Daneil Dolin, MD;  [facility];  Service: Endoscopy;  Laterality: N/A;  200 - moved to 12:45 - office calling pt with 11:15 arrival time  ? COLONOSCOPY WITH PROPOFOL N/A 07/26/2016  ? Procedure: COLONOSCOPY WITH PROPOFOL;  Surgeon: Daneil Dolin, MD;  [facility];  Service: Endoscopy;  Laterality: N/A;  115  ? COLONOSCOPY WITH PROPOFOL N/A 07/28/2018  ? Surgeon: Daneil Dolin, MD; diverticulosis in the sigmoid and descending colon, long redundant colon, otherwise normal exam.  Compromised exam due to inadequate preparation with recommendations to repeat in 1 year.  ? FLEXIBLE SIGMOIDOSCOPY N/A 07/05/2015  ? Procedure: FLEXIBLE SIGMOIDOSCOPY;  Surgeon: [name], MD;  [facility];  Service: Endoscopy;  Laterality: N/A;  with tattoo  ? POLYPECTOMY  03/31/2015   ? Procedure: POLYPECTOMY;  Surgeon: Daneil Dolin, MD;  [facility];  Service: Endoscopy;;  ? PORTACATH PLACEMENT N/A 08/09/2015  ? Procedure: INSERTION PORT-A-CATH LEFT SUBCLAVIAN;  Surgeon: [name], MD;  Location: [place];  Service: General;  Laterality: N/A;  ? XI ROBOTIC ASSISTED LOWER ANTERIOR RESECTION N/A 07/06/2015  ? Procedure: XI ROBOTIC ASSISTED LOWER ANTERIOR RESECTION, rigid proctoscopy;  Surgeon: [name], MD;  [facility];  Service: General;  Laterality: N/A;  ? ? ?SOCIAL HISTORY:  ?Social History  ? ?Socioeconomic History  ? Marital status: Divorced  ?  Spouse name: Not on file  ? Number of children: Not on file  ? Years of education: Not on file  ? Highest education level: Not on file  ?Occupational History  ? Not on file  ?Tobacco Use  ? Smoking status: Former  ?  Packs/day: 0.25  ?  Years: 2.00  ?  Pack years: 0.50  ?  Types: Cigarettes  ?  Quit date: 03/23/2006  ?  Years since quitting: 15.9  ? Smokeless tobacco: Never  ?Vaping Use  ?  Vaping Use: Never used  ?Substance and Sexual Activity  ? Alcohol use: No  ?  Alcohol/week: 0.0 standard drinks  ? Drug use: No  ? Sexual activity: Not Currently  ?Other Topics Concern  ? Not on file  ?Social History Narrative  ? Not on file  ? ?Social Determinants of Health  ? ?Financial Resource Strain: Not on file  ?Food Insecurity: Not on file  ?Transportation Needs: Not on file  ?Physical Activity: Not on file  ?Stress: Not on file  ?Social Connections: Not on file  ?Intimate Partner Violence: Not on file  ? ? ?FAMILY HISTORY:  ?Family History  ?Problem Relation Age of Onset  ? Hypertension Other   ?     "Not sure who, I don't know much about my family"  ? Diabetes Other   ?     "Not sure who, I don't know much about my family"  ? Colon cancer Neg Hx   ?     "Not that I know of"  ? Colon polyps Neg Hx   ?     "not that I know of"  ? ? ?CURRENT MEDICATIONS:  ?Current Outpatient Medications  ?Medication Sig Dispense Refill  ? ACCU-CHEK FASTCLIX  LANCETS MISC     ? ACCU-CHEK SMARTVIEW test strip     ? Alcohol Swabs (B-D SINGLE USE SWABS REGULAR) PADS     ? amLODipine (NORVASC) 10 MG tablet amlodipine 10 mg tablet ? TAKE 1 TABLET BY MOUTH ONCE DAILY    ? amLODipine

## 2022-03-14 ENCOUNTER — Encounter: Payer: Self-pay | Admitting: Internal Medicine

## 2022-03-15 DIAGNOSIS — M5451 Vertebrogenic low back pain: Secondary | ICD-10-CM | POA: Diagnosis not present

## 2022-03-19 DIAGNOSIS — M5451 Vertebrogenic low back pain: Secondary | ICD-10-CM | POA: Diagnosis not present

## 2022-03-20 DIAGNOSIS — M5451 Vertebrogenic low back pain: Secondary | ICD-10-CM | POA: Diagnosis not present

## 2022-03-21 DIAGNOSIS — Z0001 Encounter for general adult medical examination with abnormal findings: Secondary | ICD-10-CM | POA: Diagnosis not present

## 2022-03-21 DIAGNOSIS — Z1389 Encounter for screening for other disorder: Secondary | ICD-10-CM | POA: Diagnosis not present

## 2022-03-21 DIAGNOSIS — I10 Essential (primary) hypertension: Secondary | ICD-10-CM | POA: Diagnosis not present

## 2022-03-21 DIAGNOSIS — R7303 Prediabetes: Secondary | ICD-10-CM | POA: Diagnosis not present

## 2022-03-27 DIAGNOSIS — M5451 Vertebrogenic low back pain: Secondary | ICD-10-CM | POA: Diagnosis not present

## 2022-03-28 DIAGNOSIS — G894 Chronic pain syndrome: Secondary | ICD-10-CM | POA: Diagnosis not present

## 2022-03-28 DIAGNOSIS — M1711 Unilateral primary osteoarthritis, right knee: Secondary | ICD-10-CM | POA: Diagnosis not present

## 2022-03-29 DIAGNOSIS — M5451 Vertebrogenic low back pain: Secondary | ICD-10-CM | POA: Diagnosis not present

## 2022-03-30 DIAGNOSIS — I10 Essential (primary) hypertension: Secondary | ICD-10-CM | POA: Diagnosis not present

## 2022-03-30 DIAGNOSIS — G4733 Obstructive sleep apnea (adult) (pediatric): Secondary | ICD-10-CM | POA: Diagnosis not present

## 2022-04-03 DIAGNOSIS — M5451 Vertebrogenic low back pain: Secondary | ICD-10-CM | POA: Diagnosis not present

## 2022-04-10 DIAGNOSIS — M5451 Vertebrogenic low back pain: Secondary | ICD-10-CM | POA: Diagnosis not present

## 2022-04-11 DIAGNOSIS — M5451 Vertebrogenic low back pain: Secondary | ICD-10-CM | POA: Diagnosis not present

## 2022-04-13 DIAGNOSIS — M5451 Vertebrogenic low back pain: Secondary | ICD-10-CM | POA: Diagnosis not present

## 2022-04-21 DIAGNOSIS — I10 Essential (primary) hypertension: Secondary | ICD-10-CM | POA: Diagnosis not present

## 2022-04-21 DIAGNOSIS — G63 Polyneuropathy in diseases classified elsewhere: Secondary | ICD-10-CM | POA: Diagnosis not present

## 2022-04-24 DIAGNOSIS — M5451 Vertebrogenic low back pain: Secondary | ICD-10-CM | POA: Diagnosis not present

## 2022-04-25 DIAGNOSIS — Z79891 Long term (current) use of opiate analgesic: Secondary | ICD-10-CM | POA: Diagnosis not present

## 2022-04-25 DIAGNOSIS — M1711 Unilateral primary osteoarthritis, right knee: Secondary | ICD-10-CM | POA: Diagnosis not present

## 2022-04-27 DIAGNOSIS — M5451 Vertebrogenic low back pain: Secondary | ICD-10-CM | POA: Diagnosis not present

## 2022-04-30 DIAGNOSIS — I10 Essential (primary) hypertension: Secondary | ICD-10-CM | POA: Diagnosis not present

## 2022-04-30 DIAGNOSIS — G4733 Obstructive sleep apnea (adult) (pediatric): Secondary | ICD-10-CM | POA: Diagnosis not present

## 2022-04-30 DIAGNOSIS — M5451 Vertebrogenic low back pain: Secondary | ICD-10-CM | POA: Diagnosis not present

## 2022-05-02 DIAGNOSIS — M5451 Vertebrogenic low back pain: Secondary | ICD-10-CM | POA: Diagnosis not present

## 2022-05-07 DIAGNOSIS — M5451 Vertebrogenic low back pain: Secondary | ICD-10-CM | POA: Diagnosis not present

## 2022-05-10 DIAGNOSIS — M5451 Vertebrogenic low back pain: Secondary | ICD-10-CM | POA: Diagnosis not present

## 2022-05-14 DIAGNOSIS — M5451 Vertebrogenic low back pain: Secondary | ICD-10-CM | POA: Diagnosis not present

## 2022-05-15 DIAGNOSIS — M1711 Unilateral primary osteoarthritis, right knee: Secondary | ICD-10-CM | POA: Diagnosis not present

## 2022-05-18 DIAGNOSIS — M5451 Vertebrogenic low back pain: Secondary | ICD-10-CM | POA: Diagnosis not present

## 2022-05-21 DIAGNOSIS — M5451 Vertebrogenic low back pain: Secondary | ICD-10-CM | POA: Diagnosis not present

## 2022-05-21 DIAGNOSIS — E785 Hyperlipidemia, unspecified: Secondary | ICD-10-CM | POA: Diagnosis not present

## 2022-05-21 DIAGNOSIS — I10 Essential (primary) hypertension: Secondary | ICD-10-CM | POA: Diagnosis not present

## 2022-05-25 DIAGNOSIS — M5451 Vertebrogenic low back pain: Secondary | ICD-10-CM | POA: Diagnosis not present

## 2022-05-26 NOTE — Progress Notes (Deleted)
Primary Care Physician:  Carrolyn Meiers, MD Primary Gastroenterologist:  Dr. Gala Romney  No chief complaint on file.   HPI:   [name] is a 61 y.o. male with history of colon polyps and stage III rectal adenocarcinoma in 2016 s/p segmental resection of rectosigmoid cancer followed by chemoradiation adjuvant therapy, now undergoing surveillance with oncology. Most recent CEA mildly elevated at 5.9, but this is within prior baseline fluctuations.  Hgb slightly low at 12.7 on 4/18.   He is presenting today to discuss scheduling surveillance colonoscopy.  Last colonoscopy September 2019 with diverticulosis in the sigmoid and descending colon, long redundant colon, otherwise normal exam though compromised due to inadequate preparation with recommendations to repeat in 1 year.  Notably, patient has history of poor preps in the setting of noncompliance with bowel prep instructions. We have not see patient since his last colonoscopy.    Today:     Elevated LFTs: Most recent labs 03/06/2022 with AST 67, ALT 71.  Mild LFT elevation dates back to October 2020.  CT A/P with contrast in June 2021 with hepatic steatosis.  Spleen normal.  Platelets have also been normal.  Alcohol:  Illicit drug use:  Tylenol:   OTC supplements/herbal teas:    Past Medical History:  Diagnosis Date   Arthritis    Cancer ([place])    rectal   Depression    Diabetes mellitus    "Borderline"   GERD (gastroesophageal reflux disease)    No weakness   High cholesterol    Hypertension    Neuropathy    "back missed up"   Rectal cancer [facility])    Sleep apnea     Past Surgical History:  Procedure Laterality Date   BACK SURGERY     BIOPSY  03/31/2015   Procedure: BIOPSY;  Surgeon: Daneil Dolin, MD;  [facility];  Service: Endoscopy;;   CHOLECYSTECTOMY     COLONOSCOPY WITH PROPOFOL N/A 03/31/2015   Procedure: COLONOSCOPY WITH PROPOFOL at cecum 0842; withdrawal time=44minutes;  Surgeon: Daneil Dolin, MD;  [facility];  Service: Endoscopy;  Laterality: N/A;   COLONOSCOPY WITH PROPOFOL N/A 05/21/2016   Procedure: COLONOSCOPY WITH PROPOFOL;  Surgeon: Daneil Dolin, MD;  [facility];  Service: Endoscopy;  Laterality: N/A;  200 - moved to 12:45 - office calling pt with 11:15 arrival time   COLONOSCOPY WITH PROPOFOL N/A 07/26/2016   Procedure: COLONOSCOPY WITH PROPOFOL;  Surgeon: Daneil Dolin, MD;  [facility];  Service: Endoscopy;  Laterality: N/A;  115   COLONOSCOPY WITH PROPOFOL N/A 07/28/2018   Surgeon: Daneil Dolin, MD; diverticulosis in the sigmoid and descending colon, long redundant colon, otherwise normal exam.  Compromised exam due to inadequate preparation with recommendations to repeat in 1 year.   FLEXIBLE SIGMOIDOSCOPY N/A 07/05/2015   Procedure: FLEXIBLE SIGMOIDOSCOPY;  Surgeon: [name], MD;  [facility];  Service: Endoscopy;  Laterality: N/A;  with tattoo   POLYPECTOMY  03/31/2015   Procedure: POLYPECTOMY;  Surgeon: Daneil Dolin, MD;  [facility];  Service: Endoscopy;;   PORTACATH PLACEMENT N/A 08/09/2015   Procedure: INSERTION PORT-A-CATH LEFT SUBCLAVIAN;  Surgeon: [name], MD;  Location: [place];  Service: General;  Laterality: N/A;   XI ROBOTIC ASSISTED LOWER ANTERIOR RESECTION N/A 07/06/2015   Procedure: XI ROBOTIC ASSISTED LOWER ANTERIOR RESECTION, rigid proctoscopy;  Surgeon: [name], MD;  [facility];  Service: General;  Laterality: N/A;  Current Outpatient Medications  Medication Sig Dispense Refill   ACCU-CHEK FASTCLIX LANCETS MISC      ACCU-CHEK SMARTVIEW test strip      Alcohol Swabs (B-D SINGLE USE SWABS REGULAR) PADS      amLODipine (NORVASC) 10 MG tablet amlodipine 10 mg tablet  TAKE 1 TABLET BY MOUTH ONCE DAILY     amLODipine (NORVASC) 5 MG tablet Take 1 tablet by mouth daily.     Blood Glucose Monitoring Suppl (ACCU-CHEK GUIDE) w/Device KIT daily. use as directed     diclofenac  Sodium (VOLTAREN) 1 % GEL SMARTSIG:4 Gram(s) Topical 3 Times Daily PRN     gabapentin (NEURONTIN) 800 MG tablet Take 1 tablet (800 mg total) by mouth 3 (three) times daily. (Patient taking differently: Take 800 mg by mouth 2 (two) times daily.) 90 tablet 2   lactulose (CHRONULAC) 10 GM/15ML solution Constulose 10 gram/15 mL oral solution  TAKE 30 ML BY MOUTH EVERY 3 HOURS UNTIL YOU HAVE A BOWEL MOVEMENT THEN START TAKING 30 MLS AT BEDTIME     lisinopril (PRINIVIL,ZESTRIL) 40 MG tablet Take 1 tablet (40 mg total) by mouth every morning. 30 tablet 2   naloxone (NARCAN) nasal spray 4 mg/0.1 mL SMARTSIG:Spray(s) In Nostril     NON FORMULARY PT HAS A C-PAP MACHINE     nortriptyline (PAMELOR) 75 MG capsule Take 1 capsule (75 mg total) by mouth at bedtime. 30 capsule 2   oxyCODONE-acetaminophen (PERCOCET) 10-325 MG tablet Take 1 tablet by mouth 5 (five) times daily.     tiZANidine (ZANAFLEX) 2 MG tablet Take 1 tablet (2 mg total) by mouth every 8 (eight) hours as needed for muscle spasms. (Patient taking differently: Take 2 mg by mouth 2 (two) times daily.) 30 tablet 2   No current facility-administered medications for this visit.    Allergies as of 05/28/2022   (No Known Allergies)    Family History  Problem Relation Age of Onset   Hypertension Other        "Not sure who, I don't know much about my family"   Diabetes Other        "Not sure who, I don't know much about my family"   Colon cancer Neg Hx        "Not that I know of"   Colon polyps Neg Hx        "not that I know of"    Social History   Socioeconomic History   Marital status: Divorced    Spouse name: Not on file   Number of children: Not on file   Years of education: Not on file   Highest education level: Not on file  Occupational History   Not on file  Tobacco Use   Smoking status: Former    Packs/day: 0.25    Years: 2.00    Total pack years: 0.50    Types: Cigarettes    Quit date: 03/23/2006    Years since quitting:  16.1   Smokeless tobacco: Never  Vaping Use   Vaping Use: Never used  Substance and Sexual Activity   Alcohol use: No    Alcohol/week: 0.0 standard drinks of alcohol   Drug use: No   Sexual activity: Not Currently  Other Topics Concern   Not on file  Social History Narrative   Not on file   Social Determinants of Health   Financial Resource Strain: Low Risk  (09/26/2020)   Overall Financial Resource Strain (CARDIA)    Difficulty of Paying  Living Expenses: Not hard at all  Food Insecurity: No Food Insecurity (09/26/2020)   Hunger Vital Sign    Worried About Running Out of Food in the Last Year: Never true    Ran Out of Food in the Last Year: Never true  Transportation Needs: No Transportation Needs (09/26/2020)   PRAPARE - Hydrologist (Medical): No    Lack of Transportation (Non-Medical): No  Physical Activity: Inactive (09/26/2020)   Exercise Vital Sign    Days of Exercise per Week: 0 days    Minutes of Exercise per Session: 0 min  Stress: No Stress Concern Present (09/26/2020)   [place]    Feeling of Stress : Not at all  Social Connections: Socially Isolated (09/26/2020)   Social Connection and Isolation Panel [NHANES]    Frequency of Communication with Friends and Family: More than three times a week    Frequency of Social Gatherings with Friends and Family: More than three times a week    Attends Religious Services: Never    Marine scientist or Organizations: No    Attends Archivist Meetings: Never    Marital Status: Divorced  Human resources officer Violence: Not At Risk (09/26/2020)   Humiliation, Afraid, Rape, and Kick questionnaire    Fear of Current or Ex-Partner: No    Emotionally Abused: No    Physically Abused: No    Sexually Abused: No    Review of Systems: Gen: Denies any fever, chills, cold or flu like symptoms, pre-syncope, or syncope.  CV: Denies  chest pain, heart palpitations. Resp: Denies shortness of breath, cough.  GI: See HPI GU : Denies urinary burning, urinary frequency, urinary hesitancy MS: Denies joint pain. Derm: Denies rash. Psych: Denies depression, anxiety. Heme: See HPI  Physical Exam: There were no vitals taken for this visit. General:   Alert and oriented. Pleasant and cooperative. Well-nourished and well-developed.  Head:  Normocephalic and atraumatic. Eyes:  Without icterus, sclera clear and conjunctiva pink.  Ears:  Normal auditory acuity. Lungs:  Clear to auscultation bilaterally. No wheezes, rales, or rhonchi. No distress.  Heart:  S1, S2 present without murmurs appreciated.  Abdomen:  +BS, soft, non-tender and non-distended. No HSM noted. No guarding or rebound. No masses appreciated.  Rectal:  Deferred  Msk:  Symmetrical without gross deformities. Normal posture. Extremities:  Without edema. Neurologic:  Alert and  oriented x4;  grossly normal neurologically. Skin:  Intact without significant lesions or rashes. Psych:  Normal mood and affect.    Assessment:     Plan:  ***   Aliene Altes, PA-C [facility] Gastroenterology 05/28/2022

## 2022-05-28 ENCOUNTER — Ambulatory Visit: Payer: Medicare Other | Admitting: Gastroenterology

## 2022-05-28 ENCOUNTER — Encounter: Payer: Self-pay | Admitting: Internal Medicine

## 2022-05-28 DIAGNOSIS — M5451 Vertebrogenic low back pain: Secondary | ICD-10-CM | POA: Diagnosis not present

## 2022-05-30 DIAGNOSIS — M25569 Pain in unspecified knee: Secondary | ICD-10-CM | POA: Diagnosis not present

## 2022-05-30 DIAGNOSIS — G4733 Obstructive sleep apnea (adult) (pediatric): Secondary | ICD-10-CM | POA: Diagnosis not present

## 2022-05-30 DIAGNOSIS — I10 Essential (primary) hypertension: Secondary | ICD-10-CM | POA: Diagnosis not present

## 2022-06-05 DIAGNOSIS — M5451 Vertebrogenic low back pain: Secondary | ICD-10-CM | POA: Diagnosis not present

## 2022-06-07 DIAGNOSIS — M5451 Vertebrogenic low back pain: Secondary | ICD-10-CM | POA: Diagnosis not present

## 2022-06-11 DIAGNOSIS — M5451 Vertebrogenic low back pain: Secondary | ICD-10-CM | POA: Diagnosis not present

## 2022-06-12 ENCOUNTER — Inpatient Hospital Stay (HOSPITAL_COMMUNITY): Payer: Medicare Other

## 2022-06-14 DIAGNOSIS — M5451 Vertebrogenic low back pain: Secondary | ICD-10-CM | POA: Diagnosis not present

## 2022-06-19 ENCOUNTER — Inpatient Hospital Stay: Payer: Medicare Other | Attending: Hematology

## 2022-06-19 DIAGNOSIS — C20 Malignant neoplasm of rectum: Secondary | ICD-10-CM | POA: Insufficient documentation

## 2022-06-19 LAB — COMPREHENSIVE METABOLIC PANEL
ALT: 103 U/L — ABNORMAL HIGH (ref 0–44)
AST: 72 U/L — ABNORMAL HIGH (ref 15–41)
Albumin: 3.9 g/dL (ref 3.5–5.0)
Alkaline Phosphatase: 67 U/L (ref 38–126)
Anion gap: 6 (ref 5–15)
BUN: 16 mg/dL (ref 6–20)
CO2: 23 mmol/L (ref 22–32)
Calcium: 9 mg/dL (ref 8.9–10.3)
Chloride: 110 mmol/L (ref 98–111)
Creatinine, Ser: 1.34 mg/dL — ABNORMAL HIGH (ref 0.61–1.24)
GFR, Estimated: >60 mL/min (ref >60)
Glucose, Bld: 71 mg/dL (ref 70–99)
Potassium: 3.9 mmol/L (ref 3.5–5.1)
Sodium: 139 mmol/L (ref 135–145)
Total Bilirubin: 0.7 mg/dL (ref 0.3–1.2)
Total Protein: 7.6 g/dL (ref 6.5–8.1)

## 2022-06-19 LAB — CBC WITH DIFFERENTIAL/PLATELET
Abs Immature Granulocytes: 0.05 10*3/uL (ref 0.00–0.07)
Basophils Absolute: 0 10*3/uL (ref 0.0–0.1)
Basophils Relative: 1 %
Eosinophils Absolute: 0.5 10*3/uL (ref 0.0–0.5)
Eosinophils Relative: 10 %
HCT: 41.8 % (ref 39.0–52.0)
Hemoglobin: 13.5 g/dL (ref 13.0–17.0)
Immature Granulocytes: 1 %
Lymphocytes Relative: 25 %
Lymphs Abs: 1.3 10*3/uL (ref 0.7–4.0)
MCH: 29.4 pg (ref 26.0–34.0)
MCHC: 32.3 g/dL (ref 30.0–36.0)
MCV: 91.1 fL (ref 80.0–100.0)
Monocytes Absolute: 0.6 10*3/uL (ref 0.1–1.0)
Monocytes Relative: 12 %
Neutro Abs: 2.6 10*3/uL (ref 1.7–7.7)
Neutrophils Relative %: 51 %
Platelets: 198 10*3/uL (ref 150–400)
RBC: 4.59 MIL/uL (ref 4.22–5.81)
RDW: 12.8 % (ref 11.5–15.5)
WBC: 5.1 10*3/uL (ref 4.0–10.5)
nRBC: 0 % (ref 0.0–0.2)

## 2022-06-19 MED ORDER — HEPARIN SOD (PORK) LOCK FLUSH 100 UNIT/ML IV SOLN
500.0000 [IU] | Freq: Once | INTRAVENOUS | Status: AC
  Administered 2022-06-19: 500 [IU] via INTRAVENOUS

## 2022-06-19 MED ORDER — SODIUM CHLORIDE 0.9% FLUSH
10.0000 mL | Freq: Once | INTRAVENOUS | Status: AC
  Administered 2022-06-19: 10 mL via INTRAVENOUS

## 2022-06-19 NOTE — Patient Instructions (Signed)
[facility]  Discharge Instructions: Thank you for choosing [facility] to provide your oncology and hematology care.  If you have a lab appointment with the Cancer Center, please come in thru the Main Entrance and check in at the main information desk.  Wear comfortable clothing and clothing appropriate for easy access to any Portacath or PICC line.   We strive to give you quality time with your provider. You may need to reschedule your appointment if you arrive late (15 or more minutes).  Arriving late affects you and other patients whose appointments are after yours.  Also, if you miss three or more appointments without notifying the office, you may be dismissed from the clinic at the provider's discretion.      For prescription refill requests, have your pharmacy contact our office and allow 72 hours for refills to be completed.      To help prevent nausea and vomiting after your treatment, we encourage you to take your nausea medication as directed.  BELOW ARE SYMPTOMS THAT SHOULD BE REPORTED IMMEDIATELY: *FEVER GREATER THAN 100.4 F (38 C) OR HIGHER *CHILLS OR SWEATING *NAUSEA AND VOMITING THAT IS NOT CONTROLLED WITH YOUR NAUSEA MEDICATION *UNUSUAL SHORTNESS OF BREATH *UNUSUAL BRUISING OR BLEEDING *URINARY PROBLEMS (pain or burning when urinating, or frequent urination) *BOWEL PROBLEMS (unusual diarrhea, constipation, pain near the anus) TENDERNESS IN MOUTH AND THROAT WITH OR WITHOUT PRESENCE OF ULCERS (sore throat, sores in mouth, or a toothache) UNUSUAL RASH, SWELLING OR PAIN  UNUSUAL VAGINAL DISCHARGE OR ITCHING   Items with * indicate a potential emergency and should be followed up as soon as possible or go to the Emergency Department if any problems should occur.  Please show the CHEMOTHERAPY ALERT CARD or IMMUNOTHERAPY ALERT CARD at check-in to the Emergency Department and triage nurse.  Should you have questions after your visit or need to  cancel or reschedule your appointment, please contact [facility] AT  [phone]  and follow the prompts.  Office hours are 8:00 a.m. to 4:30 p.m. Monday - Friday. Please note that voicemails left after 4:00 p.m. may not be returned until the following business day.  We are closed weekends and major holidays. You have access to a nurse at all times for urgent questions. Please call the main number to the clinic [phone] and follow the prompts.  For any non-urgent questions, you may also contact your provider using MyChart. We now offer e-Visits for anyone 18 and older to request care online for non-urgent symptoms. For details visit [email or web address].   Also download the MyChart app! Go to the app store, search "MyChart", open the app, select [facility], and log in with your MyChart username and password.  Masks are optional in the cancer centers. If you would like for your care team to wear a mask while they are taking care of you, please let them know. For doctor visits, patients may have with them one support person who is at least 59 years old. At this time, visitors are not allowed in the infusion area.  

## 2022-06-19 NOTE — Progress Notes (Signed)
Patients port flushed without difficulty.  Good blood return noted with no bruising or swelling noted at site.  Band aid applied.  VSS with discharge and left in satisfactory condition with no s/s of distress noted.   

## 2022-06-20 LAB — CEA: CEA: 3.9 ng/mL (ref 0.0–4.7)

## 2022-06-21 DIAGNOSIS — I10 Essential (primary) hypertension: Secondary | ICD-10-CM | POA: Diagnosis not present

## 2022-06-21 DIAGNOSIS — G63 Polyneuropathy in diseases classified elsewhere: Secondary | ICD-10-CM | POA: Diagnosis not present

## 2022-06-27 DIAGNOSIS — M545 Low back pain, unspecified: Secondary | ICD-10-CM | POA: Diagnosis not present

## 2022-06-30 DIAGNOSIS — G4733 Obstructive sleep apnea (adult) (pediatric): Secondary | ICD-10-CM | POA: Diagnosis not present

## 2022-06-30 DIAGNOSIS — I10 Essential (primary) hypertension: Secondary | ICD-10-CM | POA: Diagnosis not present

## 2022-07-22 DIAGNOSIS — E782 Mixed hyperlipidemia: Secondary | ICD-10-CM | POA: Diagnosis not present

## 2022-07-22 DIAGNOSIS — I10 Essential (primary) hypertension: Secondary | ICD-10-CM | POA: Diagnosis not present

## 2022-07-31 DIAGNOSIS — M47817 Spondylosis without myelopathy or radiculopathy, lumbosacral region: Secondary | ICD-10-CM | POA: Diagnosis not present

## 2022-08-19 DIAGNOSIS — R262 Difficulty in walking, not elsewhere classified: Secondary | ICD-10-CM | POA: Diagnosis not present

## 2022-08-19 DIAGNOSIS — I10 Essential (primary) hypertension: Secondary | ICD-10-CM | POA: Diagnosis not present

## 2022-08-27 DIAGNOSIS — I10 Essential (primary) hypertension: Secondary | ICD-10-CM | POA: Diagnosis not present

## 2022-08-27 DIAGNOSIS — G4733 Obstructive sleep apnea (adult) (pediatric): Secondary | ICD-10-CM | POA: Diagnosis not present

## 2022-08-27 DIAGNOSIS — R269 Unspecified abnormalities of gait and mobility: Secondary | ICD-10-CM | POA: Diagnosis not present

## 2022-08-28 DIAGNOSIS — Z79891 Long term (current) use of opiate analgesic: Secondary | ICD-10-CM | POA: Diagnosis not present

## 2022-08-28 DIAGNOSIS — M47817 Spondylosis without myelopathy or radiculopathy, lumbosacral region: Secondary | ICD-10-CM | POA: Diagnosis not present

## 2022-08-30 DIAGNOSIS — G4733 Obstructive sleep apnea (adult) (pediatric): Secondary | ICD-10-CM | POA: Diagnosis not present

## 2022-09-12 ENCOUNTER — Inpatient Hospital Stay (HOSPITAL_COMMUNITY): Payer: Medicare Other | Attending: Hematology

## 2022-09-12 DIAGNOSIS — Z85038 Personal history of other malignant neoplasm of large intestine: Secondary | ICD-10-CM | POA: Diagnosis not present

## 2022-09-12 MED ORDER — HEPARIN SOD (PORK) LOCK FLUSH 100 UNIT/ML IV SOLN
500.0000 [IU] | Freq: Once | INTRAVENOUS | Status: AC
  Administered 2022-09-12: 500 [IU] via INTRAVENOUS

## 2022-09-12 MED ORDER — SODIUM CHLORIDE 0.9% FLUSH
10.0000 mL | INTRAVENOUS | Status: AC
  Administered 2022-09-12: 10 mL

## 2022-09-12 NOTE — Progress Notes (Signed)
[name] presented for Portacath access and flush.  Portacath located Right chest wall accessed with  H 20 needle.  Good blood return present. Portacath flushed with 64m NS and 500U/524mHeparin and needle removed intact.  Procedure tolerated well and without incident. No complaints at this time. Discharged from clinic ambulatory in stable condition. Alert and oriented x 3. [facility]

## 2022-09-12 NOTE — Patient Instructions (Signed)
[facility]  Discharge Instructions: Thank you for choosing [facility] to provide your oncology and hematology care.  If you have a lab appointment with the Cancer Center, please come in thru the Main Entrance and check in at the main information desk.  Wear comfortable clothing and clothing appropriate for easy access to any Portacath or PICC line.   We strive to give you quality time with your provider. You may need to reschedule your appointment if you arrive late (15 or more minutes).  Arriving late affects you and other patients whose appointments are after yours.  Also, if you miss three or more appointments without notifying the office, you may be dismissed from the clinic at the provider's discretion.      For prescription refill requests, have your pharmacy contact our office and allow 72 hours for refills to be completed.    Today you received the following chemotherapy and/or immunotherapy agents port flush.      To help prevent nausea and vomiting after your treatment, we encourage you to take your nausea medication as directed.  BELOW ARE SYMPTOMS THAT SHOULD BE REPORTED IMMEDIATELY: *FEVER GREATER THAN 100.4 F (38 C) OR HIGHER *CHILLS OR SWEATING *NAUSEA AND VOMITING THAT IS NOT CONTROLLED WITH YOUR NAUSEA MEDICATION *UNUSUAL SHORTNESS OF BREATH *UNUSUAL BRUISING OR BLEEDING *URINARY PROBLEMS (pain or burning when urinating, or frequent urination) *BOWEL PROBLEMS (unusual diarrhea, constipation, pain near the anus) TENDERNESS IN MOUTH AND THROAT WITH OR WITHOUT PRESENCE OF ULCERS (sore throat, sores in mouth, or a toothache) UNUSUAL RASH, SWELLING OR PAIN  UNUSUAL VAGINAL DISCHARGE OR ITCHING   Items with * indicate a potential emergency and should be followed up as soon as possible or go to the Emergency Department if any problems should occur.  Please show the CHEMOTHERAPY ALERT CARD or IMMUNOTHERAPY ALERT CARD at check-in to the  Emergency Department and triage nurse.  Should you have questions after your visit or need to cancel or reschedule your appointment, please contact [facility] [phone]  and follow the prompts.  Office hours are 8:00 a.m. to 4:30 p.m. Monday - Friday. Please note that voicemails left after 4:00 p.m. may not be returned until the following business day.  We are closed weekends and major holidays. You have access to a nurse at all times for urgent questions. Please call the main number to the clinic [phone] and follow the prompts.  For any non-urgent questions, you may also contact your provider using MyChart. We now offer e-Visits for anyone 18 and older to request care online for non-urgent symptoms. For details visit [email or web address].   Also download the MyChart app! Go to the app store, search "MyChart", open the app, select [facility], and log in with your MyChart username and password.  Masks are optional in the cancer centers. If you would like for your care team to wear a mask while they are taking care of you, please let them know. You may have one support person who is at least 59 years old accompany you for your appointments.  

## 2022-09-19 DIAGNOSIS — G4733 Obstructive sleep apnea (adult) (pediatric): Secondary | ICD-10-CM | POA: Diagnosis not present

## 2022-09-19 DIAGNOSIS — M549 Dorsalgia, unspecified: Secondary | ICD-10-CM | POA: Diagnosis not present

## 2022-09-19 DIAGNOSIS — I10 Essential (primary) hypertension: Secondary | ICD-10-CM | POA: Diagnosis not present

## 2022-09-19 DIAGNOSIS — E785 Hyperlipidemia, unspecified: Secondary | ICD-10-CM | POA: Diagnosis not present

## 2022-09-26 DIAGNOSIS — Z79891 Long term (current) use of opiate analgesic: Secondary | ICD-10-CM | POA: Diagnosis not present

## 2022-09-26 DIAGNOSIS — M25561 Pain in right knee: Secondary | ICD-10-CM | POA: Diagnosis not present

## 2022-10-19 DIAGNOSIS — I10 Essential (primary) hypertension: Secondary | ICD-10-CM | POA: Diagnosis not present

## 2022-10-31 DIAGNOSIS — M25561 Pain in right knee: Secondary | ICD-10-CM | POA: Diagnosis not present

## 2022-10-31 DIAGNOSIS — Z79891 Long term (current) use of opiate analgesic: Secondary | ICD-10-CM | POA: Diagnosis not present

## 2022-11-19 DIAGNOSIS — I10 Essential (primary) hypertension: Secondary | ICD-10-CM | POA: Diagnosis not present

## 2022-11-28 DIAGNOSIS — Z79891 Long term (current) use of opiate analgesic: Secondary | ICD-10-CM | POA: Diagnosis not present

## 2022-11-28 DIAGNOSIS — M47817 Spondylosis without myelopathy or radiculopathy, lumbosacral region: Secondary | ICD-10-CM | POA: Diagnosis not present

## 2022-12-13 ENCOUNTER — Inpatient Hospital Stay (HOSPITAL_COMMUNITY): Payer: 59 | Attending: Hematology

## 2022-12-13 VITALS — BP 125/80 | HR 95 | Temp 97.7°F | Resp 20

## 2022-12-13 DIAGNOSIS — Z95828 Presence of other vascular implants and grafts: Secondary | ICD-10-CM

## 2022-12-13 DIAGNOSIS — Z452 Encounter for adjustment and management of vascular access device: Secondary | ICD-10-CM | POA: Diagnosis not present

## 2022-12-13 DIAGNOSIS — Z85038 Personal history of other malignant neoplasm of large intestine: Secondary | ICD-10-CM | POA: Insufficient documentation

## 2022-12-13 MED ORDER — SODIUM CHLORIDE 0.9% FLUSH
10.0000 mL | Freq: Once | INTRAVENOUS | Status: AC
  Administered 2022-12-13: 10 mL via INTRAVENOUS

## 2022-12-13 MED ORDER — HEPARIN SOD (PORK) LOCK FLUSH 100 UNIT/ML IV SOLN
500.0000 [IU] | Freq: Once | INTRAVENOUS | Status: AC
  Administered 2022-12-13: 500 [IU] via INTRAVENOUS

## 2022-12-13 NOTE — Patient Instructions (Signed)
[name]  Discharge Instructions: Thank you for choosing [facility] to provide your oncology and hematology care.  If you have a lab appointment with the [facility], please come in thru the Main Entrance and check in at the main information desk.  Wear comfortable clothing and clothing appropriate for easy access to any Portacath or PICC line.   We strive to give you quality time with your provider. You may need to reschedule your appointment if you arrive late (15 or more minutes).  Arriving late affects you and other patients whose appointments are after yours.  Also, if you miss three or more appointments without notifying the office, you may be dismissed from the clinic at the provider's discretion.      For prescription refill requests, have your pharmacy contact our office and allow 72 hours for refills to be completed.    Today you received the following pot flush, return as scheduled.   To help prevent nausea and vomiting after your treatment, we encourage you to take your nausea medication as directed.  BELOW ARE SYMPTOMS THAT SHOULD BE REPORTED IMMEDIATELY: *FEVER GREATER THAN 100.4 F (38 C) OR HIGHER *CHILLS OR SWEATING *NAUSEA AND VOMITING THAT IS NOT CONTROLLED WITH YOUR NAUSEA MEDICATION *UNUSUAL SHORTNESS OF BREATH *UNUSUAL BRUISING OR BLEEDING *URINARY PROBLEMS (pain or burning when urinating, or frequent urination) *BOWEL PROBLEMS (unusual diarrhea, constipation, pain near the anus) TENDERNESS IN MOUTH AND THROAT WITH OR WITHOUT PRESENCE OF ULCERS (sore throat, sores in mouth, or a toothache) UNUSUAL RASH, SWELLING OR PAIN  UNUSUAL VAGINAL DISCHARGE OR ITCHING   Items with * indicate a potential emergency and should be followed up as soon as possible or go to the Emergency Department if any problems should occur.  Please show the CHEMOTHERAPY ALERT CARD or IMMUNOTHERAPY ALERT CARD at check-in to the Emergency Department and  triage nurse.  Should you have questions after your visit or need to cancel or reschedule your appointment, please contact [facility] [phone]  and follow the prompts.  Office hours are 8:00 a.m. to 4:30 p.m. Monday - Friday. Please note that voicemails left after 4:00 p.m. may not be returned until the following business day.  We are closed weekends and major holidays. You have access to a nurse at all times for urgent questions. Please call the main number to the clinic [phone] and follow the prompts.  For any non-urgent questions, you may also contact your provider using MyChart. We now offer e-Visits for anyone 35 and older to request care online for non-urgent symptoms. For details visit [email or web address].   Also download the MyChart app! Go to the app store, search "MyChart", open the app, select [facility], and log in with your MyChart username and password.

## 2022-12-13 NOTE — Progress Notes (Signed)
Port flushed with no blood return noted, flushed easily, pt able to taste saline. No bruising or swelling at site. Bandaid applied and patient discharged in satisfactory condition. VVS stable with no signs or symptoms of distressed noted.

## 2022-12-20 DIAGNOSIS — I10 Essential (primary) hypertension: Secondary | ICD-10-CM | POA: Diagnosis not present

## 2022-12-31 DIAGNOSIS — M47817 Spondylosis without myelopathy or radiculopathy, lumbosacral region: Secondary | ICD-10-CM | POA: Diagnosis not present

## 2023-01-03 ENCOUNTER — Other Ambulatory Visit (HOSPITAL_COMMUNITY): Payer: Self-pay | Admitting: Nurse Practitioner

## 2023-01-03 DIAGNOSIS — M47817 Spondylosis without myelopathy or radiculopathy, lumbosacral region: Secondary | ICD-10-CM

## 2023-01-18 DIAGNOSIS — I10 Essential (primary) hypertension: Secondary | ICD-10-CM | POA: Diagnosis not present

## 2023-01-25 ENCOUNTER — Ambulatory Visit (HOSPITAL_COMMUNITY)
Admission: RE | Admit: 2023-01-25 | Discharge: 2023-01-25 | Disposition: A | Discharge status: Home / Self Care | Payer: 59 | Source: Ambulatory Visit | Attending: Nurse Practitioner | Admitting: Nurse Practitioner

## 2023-01-25 DIAGNOSIS — M47817 Spondylosis without myelopathy or radiculopathy, lumbosacral region: Secondary | ICD-10-CM | POA: Diagnosis not present

## 2023-01-25 DIAGNOSIS — M47816 Spondylosis without myelopathy or radiculopathy, lumbar region: Secondary | ICD-10-CM | POA: Diagnosis not present

## 2023-01-25 DIAGNOSIS — M5126 Other intervertebral disc displacement, lumbar region: Secondary | ICD-10-CM | POA: Diagnosis not present

## 2023-01-29 DIAGNOSIS — M47817 Spondylosis without myelopathy or radiculopathy, lumbosacral region: Secondary | ICD-10-CM | POA: Diagnosis not present

## 2023-02-19 DIAGNOSIS — I10 Essential (primary) hypertension: Secondary | ICD-10-CM | POA: Diagnosis not present

## 2023-02-26 DIAGNOSIS — M47817 Spondylosis without myelopathy or radiculopathy, lumbosacral region: Secondary | ICD-10-CM | POA: Diagnosis not present

## 2023-03-14 ENCOUNTER — Inpatient Hospital Stay: Payer: Medicare Other

## 2023-03-14 ENCOUNTER — Inpatient Hospital Stay: Payer: 59 | Attending: Hematology

## 2023-03-14 ENCOUNTER — Other Ambulatory Visit: Payer: Self-pay

## 2023-03-14 VITALS — BP 121/78 | HR 99 | Temp 97.7°F | Resp 18

## 2023-03-14 DIAGNOSIS — C20 Malignant neoplasm of rectum: Secondary | ICD-10-CM | POA: Diagnosis not present

## 2023-03-14 DIAGNOSIS — Z95828 Presence of other vascular implants and grafts: Secondary | ICD-10-CM

## 2023-03-14 LAB — CBC WITH DIFFERENTIAL/PLATELET
Abs Immature Granulocytes: 0.03 10*3/uL (ref 0.00–0.07)
Basophils Absolute: 0 10*3/uL (ref 0.0–0.1)
Basophils Relative: 1 %
Eosinophils Absolute: 0.5 10*3/uL (ref 0.0–0.5)
Eosinophils Relative: 8 %
HCT: 43.6 % (ref 39.0–52.0)
Hemoglobin: 13.9 g/dL (ref 13.0–17.0)
Immature Granulocytes: 1 %
Lymphocytes Relative: 26 %
Lymphs Abs: 1.5 10*3/uL (ref 0.7–4.0)
MCH: 29.2 pg (ref 26.0–34.0)
MCHC: 31.9 g/dL (ref 30.0–36.0)
MCV: 91.6 fL (ref 80.0–100.0)
Monocytes Absolute: 0.6 10*3/uL (ref 0.1–1.0)
Monocytes Relative: 11 %
Neutro Abs: 3.1 10*3/uL (ref 1.7–7.7)
Neutrophils Relative %: 53 %
Platelets: 169 10*3/uL (ref 150–400)
RBC: 4.76 MIL/uL (ref 4.22–5.81)
RDW: 12.8 % (ref 11.5–15.5)
WBC: 5.8 10*3/uL (ref 4.0–10.5)
nRBC: 0 % (ref 0.0–0.2)

## 2023-03-14 LAB — COMPREHENSIVE METABOLIC PANEL
ALT: 70 U/L — ABNORMAL HIGH (ref 0–44)
AST: 52 U/L — ABNORMAL HIGH (ref 15–41)
Albumin: 4 g/dL (ref 3.5–5.0)
Alkaline Phosphatase: 75 U/L (ref 38–126)
Anion gap: 9 (ref 5–15)
BUN: 16 mg/dL (ref 6–20)
CO2: 22 mmol/L (ref 22–32)
Calcium: 8.9 mg/dL (ref 8.9–10.3)
Chloride: 104 mmol/L (ref 98–111)
Creatinine, Ser: 1.25 mg/dL — ABNORMAL HIGH (ref 0.61–1.24)
GFR, Estimated: >60 mL/min (ref >60)
Glucose, Bld: 97 mg/dL (ref 70–99)
Potassium: 3.6 mmol/L (ref 3.5–5.1)
Sodium: 135 mmol/L (ref 135–145)
Total Bilirubin: 0.2 mg/dL — ABNORMAL LOW (ref 0.3–1.2)
Total Protein: 7.7 g/dL (ref 6.5–8.1)

## 2023-03-14 MED ORDER — HEPARIN SOD (PORK) LOCK FLUSH 100 UNIT/ML IV SOLN
500.0000 [IU] | Freq: Once | INTRAVENOUS | Status: AC
  Administered 2023-03-14: 500 [IU] via INTRAVENOUS

## 2023-03-14 MED ORDER — SODIUM CHLORIDE 0.9% FLUSH
10.0000 mL | Freq: Once | INTRAVENOUS | Status: AC
  Administered 2023-03-14: 10 mL via INTRAVENOUS

## 2023-03-14 NOTE — Progress Notes (Signed)
Port flushed with good blood return noted. No bruising or swelling at site. Bandaid applied and patient discharged in satisfactory condition. VVS stable with no signs or symptoms of distressed noted. 

## 2023-03-14 NOTE — Patient Instructions (Signed)
[facility]  Discharge Instructions: Thank you for choosing [facility] to provide your oncology and hematology care.  If you have a lab appointment with the Cancer Center - please note that after April 8th, 2024, all labs will be drawn in the cancer center.  You do not have to check in or register with the main entrance as you have in the past but will complete your check-in in the cancer center.  Wear comfortable clothing and clothing appropriate for easy access to any Portacath or PICC line.   We strive to give you quality time with your provider. You may need to reschedule your appointment if you arrive late (15 or more minutes).  Arriving late affects you and other patients whose appointments are after yours.  Also, if you miss three or more appointments without notifying the office, you may be dismissed from the clinic at the provider's discretion.      For prescription refill requests, have your pharmacy contact our office and allow 72 hours for refills to be completed.    Today you received the following port flush with lab draw, return as scheduled.    To help prevent nausea and vomiting after your treatment, we encourage you to take your nausea medication as directed.  BELOW ARE SYMPTOMS THAT SHOULD BE REPORTED IMMEDIATELY: *FEVER GREATER THAN 100.4 F (38 C) OR HIGHER *CHILLS OR SWEATING *NAUSEA AND VOMITING THAT IS NOT CONTROLLED WITH YOUR NAUSEA MEDICATION *UNUSUAL SHORTNESS OF BREATH *UNUSUAL BRUISING OR BLEEDING *URINARY PROBLEMS (pain or burning when urinating, or frequent urination) *BOWEL PROBLEMS (unusual diarrhea, constipation, pain near the anus) TENDERNESS IN MOUTH AND THROAT WITH OR WITHOUT PRESENCE OF ULCERS (sore throat, sores in mouth, or a toothache) UNUSUAL RASH, SWELLING OR PAIN  UNUSUAL VAGINAL DISCHARGE OR ITCHING   Items with * indicate a potential emergency and should be followed up as soon as possible or go to the  Emergency Department if any problems should occur.  Please show the CHEMOTHERAPY ALERT CARD or IMMUNOTHERAPY ALERT CARD at check-in to the Emergency Department and triage nurse.  Should you have questions after your visit or need to cancel or reschedule your appointment, please contact [facility] [phone]  and follow the prompts.  Office hours are 8:00 a.m. to 4:30 p.m. Monday - Friday. Please note that voicemails left after 4:00 p.m. may not be returned until the following business day.  We are closed weekends and major holidays. You have access to a nurse at all times for urgent questions. Please call the main number to the clinic [phone] and follow the prompts.  For any non-urgent questions, you may also contact your provider using MyChart. We now offer e-Visits for anyone 18 and older to request care online for non-urgent symptoms. For details visit [email or web address].   Also download the MyChart app! Go to the app store, search "MyChart", open the app, select [facility], and log in with your MyChart username and password.   

## 2023-03-16 LAB — CEA: CEA: 4.1 ng/mL (ref 0.0–4.7)

## 2023-03-19 DIAGNOSIS — C20 Malignant neoplasm of rectum: Secondary | ICD-10-CM | POA: Diagnosis not present

## 2023-03-20 NOTE — Progress Notes (Signed)
[facility] [address] [place] [address]    Clinic Day:  03/21/2023  Referring physician: Benetta Spar*  Patient Care Team: Benetta Spar, MD as PCP - General (Internal Medicine) Jena Gauss Gerrit Friends, MD as Consulting Physician (Gastroenterology)   ASSESSMENT & PLAN:   Assessment: 1.  Stage III rectal adenocarcinoma: -Status post segmental resection of the rectosigmoid cancer on 07/06/2015, pathology showing 1/14 lymph nodes positive, margins negative, MMR normal. -6 cycles of adjuvant FOLFOX from 08/15/2015 through 11/06/2015 followed by 5-FU and XRT from 12/05/2015 through 01/16/2016 followed by 2 more cycles of FOLFOX from 01/30/2016 through 02/13/2016. -Last colonoscopy on 07/28/2018 showed diverticulosis of the sigmoid colon and descending colon. -Patient had CT CAP on 04/21/2020 which showed no evidence of metastatic disease.  Did show degree of colonic distention suggests constipation.  Cecal distention now is near 10 cm without perirectal stranding this is increased from prior scans.      Plan: 1.  Stage III rectal adenocarcinoma: - Denies any bowel changes or bleeding per rectum or melena. - He has not had colonoscopy in the last year. - Reviewed labs today with elevated AST and ALT for the last few years.  CBC was grossly normal.  CEA was 4.1. - Elevated LFTs likely from fatty liver. - Recommend follow-up in 1 year with repeat labs and CEA. - Recommend follow-up with Dr. Jena Gauss for repeat colonoscopy.    Orders Placed This Encounter  Procedures   CBC with Differential/Platelet    Standing Status:   Future    Standing Expiration Date:   03/20/2024    Order Specific Question:   Release to patient    Answer:   Immediate   Comprehensive metabolic panel    Standing Status:   Future    Standing Expiration Date:   03/20/2024    Order Specific Question:   Release to patient    Answer:   Immediate   CEA    Standing Status:   Future    Standing  Expiration Date:   03/20/2024      I,Katie Daubenspeck,acting as a scribe for Doreatha Massed, MD.,have documented all relevant documentation on the behalf of Doreatha Massed, MD,as directed by  Doreatha Massed, MD while in the presence of Doreatha Massed, MD.   I, Doreatha Massed MD, have reviewed the above documentation for accuracy and completeness, and I agree with the above.   Doreatha Massed, MD   5/2/20244:40 PM  CHIEF COMPLAINT:   Diagnosis: rectal cancer    Cancer Staging  Rectal cancer [facility]) Staging form: Colon and Rectum, AJCC 7th Edition - Pathologic stage from 09/12/2015: Stage IIIA (T1, N1a, cM0) - Signed by Ellouise Newer, PA-C on 09/25/2015 - Clinical stage from 09/26/2015: Stage IIIA (T1, N1a, M0) - Signed by Ellouise Newer, PA-C on 09/25/2015    Prior Therapy: 1. Segmental resection of rectosigmoid on 07/06/2015. 2. FOLFOX x 6 cycles from 08/15/2015 to 11/06/2015. 3. Concurrent chemoradiation with 5-FU from 12/05/2015 to 01/16/2016 and x 2 cycles of FOLFOX consolidation to 02/13/2016  Current Therapy:  surveillance   HISTORY OF PRESENT ILLNESS:   Oncology History  Rectal cancer (HCC)  03/31/2015 Initial Biopsy   Rectum, biopsy, rectal mass - INVASIVE ADENOCARCINOMA   06/02/2015 Tumor Marker   Results for CLEVE, PAOLILLO (MRN [id number]) as of 08/28/2015 09:09  06/02/2015 16:00 CEA: 4.3    06/08/2015 Imaging   CT abd/pelvis-Rectal primary, without evidence of metastatic disease or acute complication.  06/24/2015 Imaging   CT chest- No specific features identified to suggest metastatic disease to the chest.   07/06/2015 Definitive Surgery   Robotic-assisted lower anterior resrction, rigid proctoscopy Maisie Fus)- INVASIVE ADENOCARCINOMA, 2 cm, TUMOR INVADES INTO SUBMUCOSA. LVI/PNI (+).  1/14 LN (+). Margins neg.    07/06/2015 Miscellaneous   IHC Tumor Expression Results normal.  Negative for MLH1, MSH2, MSH6, & PMS2.    07/06/2015  Pathologic Stage   pT1, pN1a, pMx   08/15/2015 - 11/16/2015 Chemotherapy   FOLFOX x 6 cycles (adjuvant chemo "phase 1")   09/26/2015 Treatment Plan Change   Treatment held today.  5FU bolus D/C'd.  Neulasta OnPro added to each subsequent cycles of therapy.   10/04/2015 Treatment Plan Change   Neulasta onpro added to all subsequent cycles of treatment.   10/18/2015 Treatment Plan Change   Tx held for thrombocytopenia.  5FU CI is reduced by 20% for subsequent treatments.   12/05/2015 - 01/16/2016 Chemotherapy   5FU continuous infusion weekly x 6 cycles; concurrent radiation. (adjuvant chemo "phase 2")   12/05/2015 - 01/18/2016 Radiation Therapy   Pelvic radiation completed in [facility]).  Pelvis 45 Gy in 25 fractions.  Then cone down boost treatment for additional 5.4 Gy in 8 fractions.  Total dose: 50.4 Gy in 33 treatments   01/30/2016 - 02/13/2016 Chemotherapy   FOLFOX x 2 cycles to complete 6 months of adjuvant chemotherapy.  (adjuvant chemo "phase 3")   05/21/2016 Procedure   Colonoscopy by Dr. Jena Gauss- The procedure was aborted due to inadequate bowel prep. Patient readily admits to eating at least applesauce yesterday which was in conflict with our written and verbal preparation instructions. No specimens collected.   06/19/2016 Imaging   CT abd/pelvis- Postsurgical changes of the rectum. New presacral soft tissue thickening likely post treatment related. Recommend attention on followup. Large amount of stool in the colon compatible with constipation. No evidence for distant metastatic dz.    Imaging   CT abd/pelvis: IMPRESSION: Stable exam. No evidence of recurrent for metastatic carcinoma within the abdomen or pelvis.   Mild decrease in large colonic stool burden since prior study. No acute findings      INTERVAL HISTORY:   [name] is a 62 y.o. male presenting to clinic today for follow up of rectal cancer. He was last seen by me on 03/13/22.  Today, he states that he is doing well  overall. His appetite level is at 100%. His energy level is at 60%.  PAST MEDICAL HISTORY:   Past Medical History: Past Medical History:  Diagnosis Date   Arthritis    Cancer (HCC)    rectal   Depression    Diabetes mellitus    "Borderline"   GERD (gastroesophageal reflux disease)    No weakness   High cholesterol    Hypertension    Neuropathy    "back missed up"   Rectal cancer [facility])    Sleep apnea     Surgical History: Past Surgical History:  Procedure Laterality Date   BACK SURGERY     BIOPSY  03/31/2015   Procedure: BIOPSY;  Surgeon: Corbin Ade, MD;  Location: [facility] ORS;  Service: Endoscopy;;   CHOLECYSTECTOMY     COLONOSCOPY WITH PROPOFOL N/A 03/31/2015   Procedure: COLONOSCOPY WITH PROPOFOL at cecum 0842; withdrawal time=56minutes;  Surgeon: Corbin Ade, MD;  Location: [facility] ORS;  Service: Endoscopy;  Laterality: N/A;   COLONOSCOPY WITH PROPOFOL N/A 05/21/2016   Procedure: COLONOSCOPY WITH PROPOFOL;  Surgeon: Gerrit Friends  Rourk, MD;  Location: [facility] ENDO SUITE;  Service: Endoscopy;  Laterality: N/A;  200 - moved to 12:45 - office calling pt with 11:15 arrival time   COLONOSCOPY WITH PROPOFOL N/A 07/26/2016   Procedure: COLONOSCOPY WITH PROPOFOL;  Surgeon: Corbin Ade, MD;  Location: [facility] ENDO SUITE;  Service: Endoscopy;  Laterality: N/A;  115   COLONOSCOPY WITH PROPOFOL N/A 07/28/2018   Surgeon: Corbin Ade, MD; diverticulosis in the sigmoid and descending colon, long redundant colon, otherwise normal exam.  Compromised exam due to inadequate preparation with recommendations to repeat in 1 year.   FLEXIBLE SIGMOIDOSCOPY N/A 07/05/2015   Procedure: FLEXIBLE SIGMOIDOSCOPY;  Surgeon: Romie Levee, MD;  Location: [facility] ENDOSCOPY;  Service: Endoscopy;  Laterality: N/A;  with tattoo   POLYPECTOMY  03/31/2015   Procedure: POLYPECTOMY;  Surgeon: Corbin Ade, MD;  Location: [facility] ORS;  Service: Endoscopy;;   PORTACATH PLACEMENT N/A 08/09/2015   Procedure: INSERTION PORT-A-CATH  LEFT SUBCLAVIAN;  Surgeon: Romie Levee, MD;  Location: [facility] OR;  Service: General;  Laterality: N/A;   XI ROBOTIC ASSISTED LOWER ANTERIOR RESECTION N/A 07/06/2015   Procedure: XI ROBOTIC ASSISTED LOWER ANTERIOR RESECTION, rigid proctoscopy;  Surgeon: Romie Levee, MD;  Location: [facility] ORS;  Service: General;  Laterality: N/A;    Social History: Social History   Socioeconomic History   Marital status: Divorced    Spouse name: Not on file   Number of children: Not on file   Years of education: Not on file   Highest education level: Not on file  Occupational History   Not on file  Tobacco Use   Smoking status: Former    Packs/day: 0.25    Years: 2.00    Additional pack years: 0.00    Total pack years: 0.50    Types: Cigarettes    Quit date: 03/23/2006    Years since quitting: 17.0   Smokeless tobacco: Never  Vaping Use   Vaping Use: Never used  Substance and Sexual Activity   Alcohol use: No    Alcohol/week: 0.0 standard drinks of alcohol   Drug use: No   Sexual activity: Not Currently  Other Topics Concern   Not on file  Social History Narrative   Not on file   Social Determinants of Health   Financial Resource Strain: Low Risk  (09/26/2020)   Overall Financial Resource Strain (CARDIA)    Difficulty of Paying Living Expenses: Not hard at all  Food Insecurity: No Food Insecurity (09/26/2020)   Hunger Vital Sign    Worried About Running Out of Food in the Last Year: Never true    Ran Out of Food in the Last Year: Never true  Transportation Needs: No Transportation Needs (09/26/2020)   PRAPARE - Administrator, Civil Service (Medical): No    Lack of Transportation (Non-Medical): No  Physical Activity: Inactive (09/26/2020)   Exercise Vital Sign    Days of Exercise per Week: 0 days    Minutes of Exercise per Session: 0 min  Stress: No Stress Concern Present (09/26/2020)   [employer] of Occupational Health - Occupational Stress Questionnaire    Feeling of  Stress : Not at all  Social Connections: Socially Isolated (09/26/2020)   Social Connection and Isolation Panel [NHANES]    Frequency of Communication with Friends and Family: More than three times a week    Frequency of Social Gatherings with Friends and Family: More than three times a week    Attends Religious Services: Never  Active Member of Clubs or Organizations: No    Attends Banker Meetings: Never    Marital Status: Divorced  Catering manager Violence: Not At Risk (09/26/2020)   Humiliation, Afraid, Rape, and Kick questionnaire    Fear of Current or Ex-Partner: No    Emotionally Abused: No    Physically Abused: No    Sexually Abused: No    Family History: Family History  Problem Relation Age of Onset   Hypertension Other        "Not sure who, I don't know much about my family"   Diabetes Other        "Not sure who, I don't know much about my family"   Colon cancer Neg Hx        "Not that I know of"   Colon polyps Neg Hx        "not that I know of"    Current Medications:  Current Outpatient Medications:    ACCU-CHEK FASTCLIX LANCETS MISC, , Disp: , Rfl:    ACCU-CHEK SMARTVIEW test strip, , Disp: , Rfl:    Alcohol Swabs (B-D SINGLE USE SWABS REGULAR) PADS, , Disp: , Rfl:    amLODipine (NORVASC) 10 MG tablet, amlodipine 10 mg tablet  TAKE 1 TABLET BY MOUTH ONCE DAILY, Disp: , Rfl:    amLODipine (NORVASC) 5 MG tablet, Take 1 tablet by mouth daily., Disp: , Rfl:    BELBUCA 600 MCG FILM, SMARTSIG:1 Strip(s) By Mouth Every 12 Hours, Disp: , Rfl:    Blood Glucose Monitoring Suppl (ACCU-CHEK GUIDE) w/Device KIT, daily. use as directed, Disp: , Rfl:    diclofenac Sodium (VOLTAREN) 1 % GEL, SMARTSIG:4 Gram(s) Topical 3 Times Daily PRN, Disp: , Rfl:    gabapentin (NEURONTIN) 800 MG tablet, Take 1 tablet (800 mg total) by mouth 3 (three) times daily. (Patient taking differently: Take 800 mg by mouth 2 (two) times daily.), Disp: 90 tablet, Rfl: 2    HYDROcodone-acetaminophen (NORCO) 10-325 MG tablet, Take 1 tablet by mouth 5 (five) times daily as needed., Disp: , Rfl:    lactulose (CHRONULAC) 10 GM/15ML solution, Constulose 10 gram/15 mL oral solution  TAKE 30 ML BY MOUTH EVERY 3 HOURS UNTIL YOU HAVE A BOWEL MOVEMENT THEN START TAKING 30 MLS AT BEDTIME, Disp: , Rfl:    lisinopril (PRINIVIL,ZESTRIL) 40 MG tablet, Take 1 tablet (40 mg total) by mouth every morning., Disp: 30 tablet, Rfl: 2   naloxone (NARCAN) nasal spray 4 mg/0.1 mL, SMARTSIG:Spray(s) In Nostril, Disp: , Rfl:    NON FORMULARY, PT HAS A C-PAP MACHINE, Disp: , Rfl:    nortriptyline (PAMELOR) 75 MG capsule, Take 1 capsule (75 mg total) by mouth at bedtime., Disp: 30 capsule, Rfl: 2   oxyCODONE-acetaminophen (PERCOCET) 10-325 MG tablet, Take 1 tablet by mouth 5 (five) times daily., Disp: , Rfl:    tiZANidine (ZANAFLEX) 2 MG tablet, Take 1 tablet (2 mg total) by mouth every 8 (eight) hours as needed for muscle spasms. (Patient taking differently: Take 2 mg by mouth 2 (two) times daily.), Disp: 30 tablet, Rfl: 2   Allergies: No Known Allergies  REVIEW OF SYSTEMS:   Review of Systems  Constitutional:  Negative for chills, fatigue and fever.  HENT:   Negative for lump/mass, mouth sores, nosebleeds, sore throat and trouble swallowing.   Eyes:  Negative for eye problems.  Respiratory:  Negative for cough and shortness of breath.   Cardiovascular:  Negative for chest pain, leg swelling and palpitations.  Gastrointestinal:  Negative for abdominal pain, constipation, diarrhea, nausea and vomiting.  Genitourinary:  Negative for bladder incontinence, difficulty urinating, dysuria, frequency, hematuria and nocturia.   Musculoskeletal:  Negative for arthralgias, back pain, flank pain, myalgias and neck pain.  Skin:  Negative for itching and rash.  Neurological:  Negative for dizziness, headaches and numbness.  Hematological:  Does not bruise/bleed easily.  Psychiatric/Behavioral:   Positive for depression. Negative for sleep disturbance and suicidal ideas. The patient is not nervous/anxious.   All other systems reviewed and are negative.    VITALS:   Blood pressure 124/81, pulse 96, temperature 98.2 F (36.8 C), temperature source Oral, resp. rate 18, weight 216 lb 8 oz (98.2 kg), SpO2 96 %.  Wt Readings from Last 3 Encounters:  03/21/23 216 lb 8 oz (98.2 kg)  03/13/22 212 lb 9.6 oz (96.4 kg)  03/06/22 213 lb 12.8 oz (97 kg)    Body mass index is 39.6 kg/m.  Performance status (ECOG): 1 - Symptomatic but completely ambulatory  PHYSICAL EXAM:   Physical Exam Vitals and nursing note reviewed. Exam conducted with a chaperone present.  Constitutional:      Appearance: Normal appearance.  Cardiovascular:     Rate and Rhythm: Normal rate and regular rhythm.     Pulses: Normal pulses.     Heart sounds: Normal heart sounds.  Pulmonary:     Effort: Pulmonary effort is normal.     Breath sounds: Normal breath sounds.  Abdominal:     Palpations: Abdomen is soft. There is no hepatomegaly, splenomegaly or mass.     Tenderness: There is no abdominal tenderness.  Musculoskeletal:     Right lower leg: No edema.     Left lower leg: No edema.  Lymphadenopathy:     Cervical: No cervical adenopathy.     Right cervical: No superficial, deep or posterior cervical adenopathy.    Left cervical: No superficial, deep or posterior cervical adenopathy.     Upper Body:     Right upper body: No supraclavicular or axillary adenopathy.     Left upper body: No supraclavicular or axillary adenopathy.  Neurological:     General: No focal deficit present.     Mental Status: He is alert and oriented to person, place, and time.  Psychiatric:        Mood and Affect: Mood normal.        Behavior: Behavior normal.     LABS:      Latest Ref Rng & Units 03/21/2023    3:16 PM 03/14/2023   10:59 AM 06/19/2022    2:56 PM  CBC  WBC 4.0 - 10.5 K/uL 5.7  5.8  5.1   Hemoglobin 13.0 -  17.0 g/dL 16.1  09.6  04.5   Hematocrit 39.0 - 52.0 % 43.2  43.6  41.8   Platelets 150 - 400 K/uL 175  169  198       Latest Ref Rng & Units 03/21/2023    3:16 PM 03/14/2023   10:59 AM 06/19/2022    2:56 PM  CMP  Glucose 70 - 99 mg/dL 409  97  71   BUN 6 - 20 mg/dL 15  16  16    Creatinine 0.61 - 1.24 mg/dL 8.11  9.14  7.82   Sodium 135 - 145 mmol/L 135  135  139   Potassium 3.5 - 5.1 mmol/L 3.7  3.6  3.9   Chloride 98 - 111 mmol/L 104  104  110   CO2 22 -  32 mmol/L 21  22  23    Calcium 8.9 - 10.3 mg/dL 9.0  8.9  9.0   Total Protein 6.5 - 8.1 g/dL 7.9  7.7  7.6   Total Bilirubin 0.3 - 1.2 mg/dL 0.5  0.2  0.7   Alkaline Phos 38 - 126 U/L 80  75  67   AST 15 - 41 U/L 48  52  72   ALT 0 - 44 U/L 65  70  103      Lab Results  Component Value Date   CEA1 4.1 03/14/2023   CEA 4.4 12/13/2016   /  CEA  Date Value Ref Range Status  03/14/2023 4.1 0.0 - 4.7 ng/mL Final    Comment:    (NOTE)                             Nonsmokers          <3.9                             Smokers             <5.6 Roche [employer]ostics Electrochemiluminescence Immunoassay (ECLIA) Values obtained with different assay methods or kits cannot be used interchangeably.  Results cannot be interpreted as absolute evidence of the presence or absence of malignant disease. Performed At: [facility] [address] [place], [place] [id number] Jolene Schimke MD WU:[id number]   12/13/2016 4.4 0.0 - 4.7 ng/mL Final    Comment:    (NOTE)       Roche ECLIA methodology       Nonsmokers  <3.9                                     Smokers     <5.6 Performed At: [facility] [address] [place], [place] [id number] Mila Homer MD VH:[id number]    No results found for: "PSA1" No results found for: "CAN199" No results found for: "CAN125"  No results found for: "TOTALPROTELP", "ALBUMINELP", "A1GS", "A2GS", "BETS", "BETA2SER", "GAMS", "MSPIKE", "SPEI" Lab Results  Component Value Date   FERRITIN  122 10/22/2018   Lab Results  Component Value Date   LDH 167 03/06/2022   LDH 140 02/28/2021   LDH 151 08/29/2020     STUDIES:   No results found.

## 2023-03-21 ENCOUNTER — Inpatient Hospital Stay (HOSPITAL_COMMUNITY): Payer: 59 | Attending: Hematology | Admitting: Hematology

## 2023-03-21 ENCOUNTER — Other Ambulatory Visit (HOSPITAL_COMMUNITY)
Admission: RE | Admit: 2023-03-21 | Discharge: 2023-03-21 | Disposition: A | Discharge status: Home / Self Care | Payer: 59 | Source: Ambulatory Visit | Attending: Internal Medicine | Admitting: Internal Medicine

## 2023-03-21 VITALS — BP 124/81 | HR 96 | Temp 98.2°F | Resp 18 | Wt 216.5 lb

## 2023-03-21 DIAGNOSIS — C20 Malignant neoplasm of rectum: Secondary | ICD-10-CM | POA: Insufficient documentation

## 2023-03-21 DIAGNOSIS — R945 Abnormal results of liver function studies: Secondary | ICD-10-CM | POA: Insufficient documentation

## 2023-03-21 DIAGNOSIS — Z85048 Personal history of other malignant neoplasm of rectum, rectosigmoid junction, and anus: Secondary | ICD-10-CM | POA: Insufficient documentation

## 2023-03-21 DIAGNOSIS — Z87891 Personal history of nicotine dependence: Secondary | ICD-10-CM | POA: Insufficient documentation

## 2023-03-21 DIAGNOSIS — Z9221 Personal history of antineoplastic chemotherapy: Secondary | ICD-10-CM | POA: Diagnosis not present

## 2023-03-21 DIAGNOSIS — Z452 Encounter for adjustment and management of vascular access device: Secondary | ICD-10-CM | POA: Insufficient documentation

## 2023-03-21 DIAGNOSIS — Z125 Encounter for screening for malignant neoplasm of prostate: Secondary | ICD-10-CM | POA: Insufficient documentation

## 2023-03-21 DIAGNOSIS — I10 Essential (primary) hypertension: Secondary | ICD-10-CM | POA: Insufficient documentation

## 2023-03-21 DIAGNOSIS — Z923 Personal history of irradiation: Secondary | ICD-10-CM | POA: Insufficient documentation

## 2023-03-21 DIAGNOSIS — Z859 Personal history of malignant neoplasm, unspecified: Secondary | ICD-10-CM | POA: Insufficient documentation

## 2023-03-21 DIAGNOSIS — R262 Difficulty in walking, not elsewhere classified: Secondary | ICD-10-CM | POA: Diagnosis not present

## 2023-03-21 DIAGNOSIS — R7989 Other specified abnormal findings of blood chemistry: Secondary | ICD-10-CM | POA: Diagnosis not present

## 2023-03-21 DIAGNOSIS — E785 Hyperlipidemia, unspecified: Secondary | ICD-10-CM | POA: Insufficient documentation

## 2023-03-21 DIAGNOSIS — Z1389 Encounter for screening for other disorder: Secondary | ICD-10-CM | POA: Diagnosis not present

## 2023-03-21 DIAGNOSIS — R7303 Prediabetes: Secondary | ICD-10-CM | POA: Insufficient documentation

## 2023-03-21 DIAGNOSIS — Z0001 Encounter for general adult medical examination with abnormal findings: Secondary | ICD-10-CM | POA: Insufficient documentation

## 2023-03-21 DIAGNOSIS — G63 Polyneuropathy in diseases classified elsewhere: Secondary | ICD-10-CM | POA: Diagnosis not present

## 2023-03-21 DIAGNOSIS — E7800 Pure hypercholesterolemia, unspecified: Secondary | ICD-10-CM | POA: Diagnosis not present

## 2023-03-21 DIAGNOSIS — Z79899 Other long term (current) drug therapy: Secondary | ICD-10-CM | POA: Insufficient documentation

## 2023-03-21 DIAGNOSIS — G4733 Obstructive sleep apnea (adult) (pediatric): Secondary | ICD-10-CM | POA: Diagnosis not present

## 2023-03-21 LAB — HEPATIC FUNCTION PANEL
ALT: 65 U/L — ABNORMAL HIGH (ref 0–44)
AST: 48 U/L — ABNORMAL HIGH (ref 15–41)
Albumin: 4.2 g/dL (ref 3.5–5.0)
Alkaline Phosphatase: 80 U/L (ref 38–126)
Bilirubin, Direct: 0.1 mg/dL (ref 0.0–0.2)
Indirect Bilirubin: 0.4 mg/dL (ref 0.3–0.9)
Total Bilirubin: 0.5 mg/dL (ref 0.3–1.2)
Total Protein: 7.9 g/dL (ref 6.5–8.1)

## 2023-03-21 LAB — CBC WITH DIFFERENTIAL/PLATELET
Abs Immature Granulocytes: 0.04 10*3/uL (ref 0.00–0.07)
Basophils Absolute: 0 10*3/uL (ref 0.0–0.1)
Basophils Relative: 1 %
Eosinophils Absolute: 0.4 10*3/uL (ref 0.0–0.5)
Eosinophils Relative: 7 %
HCT: 43.2 % (ref 39.0–52.0)
Hemoglobin: 14.3 g/dL (ref 13.0–17.0)
Immature Granulocytes: 1 %
Lymphocytes Relative: 26 %
Lymphs Abs: 1.5 10*3/uL (ref 0.7–4.0)
MCH: 29.7 pg (ref 26.0–34.0)
MCHC: 33.1 g/dL (ref 30.0–36.0)
MCV: 89.6 fL (ref 80.0–100.0)
Monocytes Absolute: 0.5 10*3/uL (ref 0.1–1.0)
Monocytes Relative: 8 %
Neutro Abs: 3.3 10*3/uL (ref 1.7–7.7)
Neutrophils Relative %: 57 %
Platelets: 175 10*3/uL (ref 150–400)
RBC: 4.82 MIL/uL (ref 4.22–5.81)
RDW: 12.8 % (ref 11.5–15.5)
WBC: 5.7 10*3/uL (ref 4.0–10.5)
nRBC: 0 % (ref 0.0–0.2)

## 2023-03-21 LAB — BASIC METABOLIC PANEL
Anion gap: 10 (ref 5–15)
BUN: 15 mg/dL (ref 6–20)
CO2: 21 mmol/L — ABNORMAL LOW (ref 22–32)
Calcium: 9 mg/dL (ref 8.9–10.3)
Chloride: 104 mmol/L (ref 98–111)
Creatinine, Ser: 1.24 mg/dL (ref 0.61–1.24)
GFR, Estimated: >60 mL/min (ref >60)
Glucose, Bld: 121 mg/dL — ABNORMAL HIGH (ref 70–99)
Potassium: 3.7 mmol/L (ref 3.5–5.1)
Sodium: 135 mmol/L (ref 135–145)

## 2023-03-21 LAB — PSA: Prostatic Specific Antigen: 1.4 ng/mL (ref 0.00–4.00)

## 2023-03-21 LAB — LIPID PANEL
Cholesterol: 174 mg/dL (ref 0–200)
HDL: 40 mg/dL — ABNORMAL LOW (ref >40)
LDL Cholesterol: 90 mg/dL (ref 0–99)
Total CHOL/HDL Ratio: 4.4 RATIO
Triglycerides: 220 mg/dL — ABNORMAL HIGH (ref <150)
VLDL: 44 mg/dL — ABNORMAL HIGH (ref 0–40)

## 2023-03-21 NOTE — Patient Instructions (Signed)
[facility]  Discharge Instructions  You were seen and examined today by Dr. Ellin Saba.  Dr. Ellin Saba discussed your most recent lab work which revealed that everything looks good and stable.  Get your colonoscopy done by Dr. Jena Gauss.   Follow-up as scheduled in 1 year.    Thank you for choosing [facility] to provide your oncology and hematology care.   To afford each patient quality time with our provider, please arrive at least 15 minutes before your scheduled appointment time. You may need to reschedule your appointment if you arrive late (10 or more minutes). Arriving late affects you and other patients whose appointments are after yours.  Also, if you miss three or more appointments without notifying the office, you may be dismissed from the clinic at the provider's discretion.    Again, thank you for choosing [facility].  Our hope is that these requests will decrease the amount of time that you wait before being seen by our physicians.   If you have a lab appointment with the Cancer Center - please note that after April 8th, all labs will be drawn in the cancer center.  You do not have to check in or register with the main entrance as you have in the past but will complete your check-in at the cancer center.            _____________________________________________________________  Should you have questions after your visit to [facility], please contact our office at [phone] and follow the prompts.  Our office hours are 8:00 a.m. to 4:30 p.m. Monday - Thursday and 8:00 a.m. to 2:30 p.m. Friday.  Please note that voicemails left after 4:00 p.m. may not be returned until the following business day.  We are closed weekends and all major holidays.  You do have access to a nurse 24-7, just call the main number to the clinic [phone] and do not press any options, hold on the line and a nurse will  answer the phone.    For prescription refill requests, have your pharmacy contact our office and allow 72 hours.    Masks are no longer required in the cancer centers. If you would like for your care team to wear a mask while they are taking care of you, please let them know. You may have one support person who is at least 60 years old accompany you for your appointments.

## 2023-03-22 LAB — HEMOGLOBIN A1C
Hgb A1c MFr Bld: 6 % — ABNORMAL HIGH (ref 4.8–5.6)
Mean Plasma Glucose: 125.5 mg/dL

## 2023-03-23 LAB — HCV INTERPRETATION

## 2023-03-23 LAB — HCV AB W REFLEX TO QUANT PCR: HCV Ab: NONREACTIVE

## 2023-03-26 DIAGNOSIS — M47817 Spondylosis without myelopathy or radiculopathy, lumbosacral region: Secondary | ICD-10-CM | POA: Diagnosis not present

## 2023-03-26 DIAGNOSIS — G894 Chronic pain syndrome: Secondary | ICD-10-CM | POA: Diagnosis not present

## 2023-03-26 DIAGNOSIS — Z79891 Long term (current) use of opiate analgesic: Secondary | ICD-10-CM | POA: Diagnosis not present

## 2023-04-21 DIAGNOSIS — I10 Essential (primary) hypertension: Secondary | ICD-10-CM | POA: Diagnosis not present

## 2023-04-23 DIAGNOSIS — M47817 Spondylosis without myelopathy or radiculopathy, lumbosacral region: Secondary | ICD-10-CM | POA: Diagnosis not present

## 2023-04-23 DIAGNOSIS — G894 Chronic pain syndrome: Secondary | ICD-10-CM | POA: Diagnosis not present

## 2023-05-03 DIAGNOSIS — G4733 Obstructive sleep apnea (adult) (pediatric): Secondary | ICD-10-CM | POA: Diagnosis not present

## 2023-05-20 DIAGNOSIS — I10 Essential (primary) hypertension: Secondary | ICD-10-CM | POA: Diagnosis not present

## 2023-05-21 DIAGNOSIS — G894 Chronic pain syndrome: Secondary | ICD-10-CM | POA: Diagnosis not present

## 2023-05-21 DIAGNOSIS — M47817 Spondylosis without myelopathy or radiculopathy, lumbosacral region: Secondary | ICD-10-CM | POA: Diagnosis not present

## 2023-06-05 ENCOUNTER — Other Ambulatory Visit (INDEPENDENT_AMBULATORY_CARE_PROVIDER_SITE_OTHER): Payer: 59

## 2023-06-05 ENCOUNTER — Encounter: Payer: Self-pay | Admitting: Orthopedic Surgery

## 2023-06-05 ENCOUNTER — Ambulatory Visit (INDEPENDENT_AMBULATORY_CARE_PROVIDER_SITE_OTHER): Payer: 59 | Admitting: Orthopedic Surgery

## 2023-06-05 VITALS — BP 107/68 | HR 106 | Ht 62.0 in | Wt 214.0 lb

## 2023-06-05 DIAGNOSIS — M1711 Unilateral primary osteoarthritis, right knee: Secondary | ICD-10-CM | POA: Diagnosis not present

## 2023-06-05 DIAGNOSIS — G8929 Other chronic pain: Secondary | ICD-10-CM

## 2023-06-05 DIAGNOSIS — M25561 Pain in right knee: Secondary | ICD-10-CM

## 2023-06-05 NOTE — Patient Instructions (Signed)
 Instructions Following Joint Injections  In clinic today, you received an injection in one of your joints (sometimes more than one).  Occasionally, you can have some pain at the injection site, this is normal.  You can place ice at the injection site, or take over-the-counter medications such as Tylenol (acetaminophen) or Advil (ibuprofen).  Please follow all directions listed on the bottle.  If your joint (knee or shoulder) becomes swollen, red or very painful, please contact the clinic for additional assistance.   Two medications were injected, including lidocaine and a steroid (often referred to as cortisone).  Lidocaine is effective almost immediately but wears off quickly.  However, the steroid can take a few days to improve your symptoms.  In some cases, it can make your pain worse for a couple of days.  Do not be concerned if this happens as it is common.  You can apply ice or take some over-the-counter medications as needed.   

## 2023-06-05 NOTE — Progress Notes (Signed)
New Patient Visit  Assessment: [name] is a 62 y.o. 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  We reviewed the radiographs in clinic today, and I outlined the natural progression.  We had an extensive discussion regarding all potential treatment options, including continuing with the current treatment. NSAIDs are the most appropriate medications, and these are available OTC or via prescription.  I have urged them to remain active, and they can continue with activities on their own, or we can refer them to physical therapy.  We can also consider a brace, or compression sleeve. If the pain is severe enough, we can consider a steroid injection.  If their knee pain is affecting their everyday activities, including sleep, knee replacement is a consideration, but we would have to refer them to see my partner Dr. Romeo Apple.  After discussing all of these options, the patient has elected to proceed with a steroid injection.  Procedure note injection Right knee joint   Verbal consent was obtained to inject the right knee joint  Timeout was completed to confirm the site of injection.  The skin was prepped with alcohol and ethyl chloride was sprayed at the injection site.  A 21-gauge needle was used to inject 40 mg of Depo-Medrol and 1% lidocaine (3 cc) into the right knee using an anterolateral approach.  There were no complications. A sterile bandage was applied.     Follow-up: Return if symptoms worsen or fail to improve.  Subjective:  Chief Complaint  Patient presents with   Knee Pain    Right - hurting for a couple months no injury has noticed some swelling    History of Present Illness: [name] is a 62 y.o. male who has been referred by America Brown, FNP for evaluation of right knee pain.  He has had progressively worsening pain in the right knee for several months.  No specific injury.  He has noticed some mild deformity.  He has  had injections in both knees in the past.  He is currently taking oxycodone chronically for pain.  He is a cane to assist with ambulation.  Pain is primarily in the medial aspect of the right knee.  He does have a history of back issues, has had back surgery and recently had injections.   Review of Systems: No fevers or chills No numbness or tingling No chest pain No shortness of breath No bowel or bladder dysfunction No GI distress No headaches   Medical History:  Past Medical History:  Diagnosis Date   Arthritis    Cancer (HCC)    rectal   Depression    Diabetes mellitus    "Borderline"   GERD (gastroesophageal reflux disease)    No weakness   High cholesterol    Hypertension    Neuropathy    "back missed up"   Rectal cancer [facility])    Sleep apnea     Past Surgical History:  Procedure Laterality Date   BACK SURGERY     BIOPSY  03/31/2015   Procedure: BIOPSY;  Surgeon: Corbin Ade, MD;  Location: [facility];  Service: Endoscopy;;   CHOLECYSTECTOMY     COLONOSCOPY WITH PROPOFOL N/A 03/31/2015   Procedure: COLONOSCOPY WITH PROPOFOL at cecum 0842; withdrawal time=26minutes;  Surgeon: Corbin Ade, MD;  Location: [facility];  Service: Endoscopy;  Laterality: N/A;   COLONOSCOPY WITH PROPOFOL N/A 05/21/2016   Procedure: COLONOSCOPY WITH PROPOFOL;  Surgeon: Corbin Ade, MD;  Location: [facility];  Service: Endoscopy;  Laterality: N/A;  200 - moved to 12:45 - office calling pt with 11:15 arrival time   COLONOSCOPY WITH PROPOFOL N/A 07/26/2016   Procedure: COLONOSCOPY WITH PROPOFOL;  Surgeon: Corbin Ade, MD;  Location: [facility];  Service: Endoscopy;  Laterality: N/A;  115   COLONOSCOPY WITH PROPOFOL N/A 07/28/2018   Surgeon: Corbin Ade, MD; diverticulosis in the sigmoid and descending colon, long redundant colon, otherwise normal exam.  Compromised exam due to inadequate preparation with recommendations to repeat in 1 year.   FLEXIBLE SIGMOIDOSCOPY N/A 07/05/2015    Procedure: FLEXIBLE SIGMOIDOSCOPY;  Surgeon: Romie Levee, MD;  Location: [facility] ENDOS[facility];  Service: Endoscopy;  Laterality: N/A;  with tattoo   POLYPECTOMY  03/31/2015   Procedure: POLYPECTOMY;  Surgeon: Corbin Ade, MD;  Location: [facility];  Service: Endoscopy;;   PORTACATH PLACEMENT N/A 08/09/2015   Procedure: INSERTION PORT-A-CATH LEFT SUBCLAVIAN;  Surgeon: Romie Levee, MD;  Location: [facility] OR;  Service: General;  Laterality: N/A;   XI ROBOTIC ASSISTED LOWER ANTERIOR RESECTION N/A 07/06/2015   Procedure: XI ROBOTIC ASSISTED LOWER ANTERIOR RESECTION, rigid proctoscopy;  Surgeon: Romie Levee, MD;  Location: [facility];  Service: General;  Laterality: N/A;    Family History  Problem Relation Age of Onset   Hypertension Other        "Not sure who, I don't know much about my family"   Diabetes Other        "Not sure who, I don't know much about my family"   Colon cancer Neg Hx        "Not that I know of"   Colon polyps Neg Hx        "not that I know of"   Social History   Tobacco Use   Smoking status: Former    Current packs/day: 0.00    Average packs/day: 0.3 packs/day for 2.0 years (0.5 ttl pk-yrs)    Types: Cigarettes    Start date: 03/23/2004    Quit date: 03/23/2006    Years since quitting: 17.2   Smokeless tobacco: Never  Vaping Use   Vaping status: Never Used  Substance Use Topics   Alcohol use: No    Alcohol/week: 0.0 standard drinks of alcohol   Drug use: No    No Known Allergies  Current Meds  Medication Sig   ACCU-CHEK FASTCLIX LANCETS MISC    ACCU-CHEK SMARTVIEW test strip    Alcohol Swabs (B-D SINGLE USE SWABS REGULAR) PADS    amLODipine (NORVASC) 10 MG tablet amlodipine 10 mg tablet  TAKE 1 TABLET BY MOUTH ONCE DAILY   amLODipine (NORVASC) 5 MG tablet Take 1 tablet by mouth daily.   BELBUCA 600 MCG FILM SMARTSIG:1 Strip(s) By Mouth Every 12 Hours   Blood Glucose Monitoring Suppl (ACCU-CHEK GUIDE) w/Device KIT daily. use as directed   diclofenac Sodium  (VOLTAREN) 1 % GEL SMARTSIG:4 Gram(s) Topical 3 Times Daily PRN   gabapentin (NEURONTIN) 800 MG tablet Take 1 tablet (800 mg total) by mouth 3 (three) times daily. (Patient taking differently: Take 800 mg by mouth 2 (two) times daily.)   HYDROcodone-acetaminophen (NORCO) 10-325 MG tablet Take 1 tablet by mouth 5 (five) times daily as needed.   lactulose (CHRONULAC) 10 GM/15ML solution Constulose 10 gram/15 mL oral solution  TAKE 30 ML BY MOUTH EVERY 3 HOURS UNTIL YOU HAVE A BOWEL MOVEMENT THEN START TAKING 30 MLS AT BEDTIME   lisinopril (  PRINIVIL,ZESTRIL) 40 MG tablet Take 1 tablet (40 mg total) by mouth every morning.   naloxone (NARCAN) nasal spray 4 mg/0.1 mL SMARTSIG:Spray(s) In Nostril   NON FORMULARY PT HAS A C-PAP MACHINE   nortriptyline (PAMELOR) 75 MG capsule Take 1 capsule (75 mg total) by mouth at bedtime.   oxyCODONE-acetaminophen (PERCOCET) 10-325 MG tablet Take 1 tablet by mouth 5 (five) times daily.   tiZANidine (ZANAFLEX) 2 MG tablet Take 1 tablet (2 mg total) by mouth every 8 (eight) hours as needed for muscle spasms. (Patient taking differently: Take 2 mg by mouth 2 (two) times daily.)    Objective: BP 107/68   Pulse (!) 106   Ht 5\' 2"  (1.575 m)   Wt 214 lb (97.1 kg)   BMI 39.14 kg/m   Physical Exam:  General: Alert and oriented. and No acute distress. Gait: Right sided antalgic gait.  Ambulates with assistance of a cane  Evaluation of right knee demonstrates a varus alignment overall.  No effusion is appreciated.  Tenderness to palpation within the medial joint line.  He has good range of motion.  No increased laxity varus valgus stress.  Negative Lachman.  IMAGING: I personally ordered and reviewed the following images  X-rays of the right knee were obtained in clinic today.  No acute injuries noted.  Varus alignment to the right knee.  Advanced degenerative changes noted, especially within the medial compartment where there is complete loss of joint space.   Osteophytes are appreciated within the patellofemoral compartment.  Impression: Severe right knee arthritis, especially within the medial compartment.   New Medications:  No orders of the defined types were placed in this encounter.     Oliver Barre, MD  06/05/2023 3:06 PM

## 2023-06-20 ENCOUNTER — Inpatient Hospital Stay: Payer: 59 | Attending: Hematology

## 2023-06-20 VITALS — BP 115/72 | HR 78 | Temp 97.7°F | Resp 18

## 2023-06-20 DIAGNOSIS — Z452 Encounter for adjustment and management of vascular access device: Secondary | ICD-10-CM | POA: Diagnosis not present

## 2023-06-20 DIAGNOSIS — I10 Essential (primary) hypertension: Secondary | ICD-10-CM | POA: Diagnosis not present

## 2023-06-20 DIAGNOSIS — M47817 Spondylosis without myelopathy or radiculopathy, lumbosacral region: Secondary | ICD-10-CM | POA: Diagnosis not present

## 2023-06-20 DIAGNOSIS — C20 Malignant neoplasm of rectum: Secondary | ICD-10-CM | POA: Diagnosis not present

## 2023-06-20 DIAGNOSIS — G894 Chronic pain syndrome: Secondary | ICD-10-CM | POA: Diagnosis not present

## 2023-06-20 DIAGNOSIS — Z95828 Presence of other vascular implants and grafts: Secondary | ICD-10-CM

## 2023-06-20 MED ORDER — SODIUM CHLORIDE 0.9% FLUSH
10.0000 mL | Freq: Once | INTRAVENOUS | Status: AC
  Administered 2023-06-20: 10 mL via INTRAVENOUS

## 2023-06-20 MED ORDER — HEPARIN SOD (PORK) LOCK FLUSH 100 UNIT/ML IV SOLN
500.0000 [IU] | Freq: Once | INTRAVENOUS | Status: AC
  Administered 2023-06-20: 500 [IU] via INTRAVENOUS

## 2023-06-20 NOTE — Progress Notes (Signed)
Patients port flushed without difficulty.  Good blood return noted with no bruising or swelling noted at site.  Band aid applied.  VSS with discharge and left in satisfactory condition with no s/s of distress noted.   

## 2023-07-21 DIAGNOSIS — I10 Essential (primary) hypertension: Secondary | ICD-10-CM | POA: Diagnosis not present

## 2023-07-23 DIAGNOSIS — G894 Chronic pain syndrome: Secondary | ICD-10-CM | POA: Diagnosis not present

## 2023-07-23 DIAGNOSIS — M47817 Spondylosis without myelopathy or radiculopathy, lumbosacral region: Secondary | ICD-10-CM | POA: Diagnosis not present

## 2023-08-15 DIAGNOSIS — M47816 Spondylosis without myelopathy or radiculopathy, lumbar region: Secondary | ICD-10-CM | POA: Diagnosis not present

## 2023-08-20 DIAGNOSIS — M47817 Spondylosis without myelopathy or radiculopathy, lumbosacral region: Secondary | ICD-10-CM | POA: Diagnosis not present

## 2023-08-20 DIAGNOSIS — G894 Chronic pain syndrome: Secondary | ICD-10-CM | POA: Diagnosis not present

## 2023-08-20 DIAGNOSIS — I10 Essential (primary) hypertension: Secondary | ICD-10-CM | POA: Diagnosis not present

## 2023-09-09 DIAGNOSIS — M47816 Spondylosis without myelopathy or radiculopathy, lumbar region: Secondary | ICD-10-CM | POA: Diagnosis not present

## 2023-09-19 ENCOUNTER — Inpatient Hospital Stay: Payer: 59 | Attending: Hematology

## 2023-09-19 DIAGNOSIS — Z452 Encounter for adjustment and management of vascular access device: Secondary | ICD-10-CM | POA: Insufficient documentation

## 2023-09-19 DIAGNOSIS — C20 Malignant neoplasm of rectum: Secondary | ICD-10-CM | POA: Insufficient documentation

## 2023-09-19 MED ORDER — SODIUM CHLORIDE 0.9% FLUSH
10.0000 mL | INTRAVENOUS | Status: DC | PRN
  Administered 2023-09-19: 10 mL via INTRAVENOUS

## 2023-09-19 MED ORDER — HEPARIN SOD (PORK) LOCK FLUSH 100 UNIT/ML IV SOLN
500.0000 [IU] | Freq: Once | INTRAVENOUS | Status: AC
  Administered 2023-09-19: 500 [IU] via INTRAVENOUS

## 2023-09-19 NOTE — Patient Instructions (Signed)
[facility]  Discharge Instructions: Thank you for choosing [facility] to provide your oncology and hematology care.  If you have a lab appointment with the Cancer Center - please note that after April 8th, 2024, all labs will be drawn in the cancer center.  You do not have to check in or register with the main entrance as you have in the past but will complete your check-in in the cancer center.  Wear comfortable clothing and clothing appropriate for easy access to any Portacath or PICC line.   We strive to give you quality time with your provider. You may need to reschedule your appointment if you arrive late (15 or more minutes).  Arriving late affects you and other patients whose appointments are after yours.  Also, if you miss three or more appointments without notifying the office, you may be dismissed from the clinic at the provider's discretion.      For prescription refill requests, have your pharmacy contact our office and allow 72 hours for refills to be completed.    Today you received port flush     BELOW ARE SYMPTOMS THAT SHOULD BE REPORTED IMMEDIATELY: *FEVER GREATER THAN 100.4 F (38 C) OR HIGHER *CHILLS OR SWEATING *NAUSEA AND VOMITING THAT IS NOT CONTROLLED WITH YOUR NAUSEA MEDICATION *UNUSUAL SHORTNESS OF BREATH *UNUSUAL BRUISING OR BLEEDING *URINARY PROBLEMS (pain or burning when urinating, or frequent urination) *BOWEL PROBLEMS (unusual diarrhea, constipation, pain near the anus) TENDERNESS IN MOUTH AND THROAT WITH OR WITHOUT PRESENCE OF ULCERS (sore throat, sores in mouth, or a toothache) UNUSUAL RASH, SWELLING OR PAIN  UNUSUAL VAGINAL DISCHARGE OR ITCHING   Items with * indicate a potential emergency and should be followed up as soon as possible or go to the Emergency Department if any problems should occur.  Please show the CHEMOTHERAPY ALERT CARD or IMMUNOTHERAPY ALERT CARD at check-in to the Emergency Department and triage  nurse.  Should you have questions after your visit or need to cancel or reschedule your appointment, please contact [facility] AT [facility] [phone]  and follow the prompts.  Office hours are 8:00 a.m. to 4:30 p.m. Monday - Friday. Please note that voicemails left after 4:00 p.m. may not be returned until the following business day.  We are closed weekends and major holidays. You have access to a nurse at all times for urgent questions. Please call the main number to the clinic [phone] and follow the prompts.  For any non-urgent questions, you may also contact your provider using MyChart. We now offer e-Visits for anyone 31 and older to request care online for non-urgent symptoms. For details visit [email or web address].   Also download the MyChart app! Go to the app store, search "MyChart", open the app, select [place], and log in with your MyChart username and password.

## 2023-09-19 NOTE — Progress Notes (Signed)
[name] presented for Portacath access and flush.  Portacath located left chest wall accessed with H20 needle.  Good blood return present. Portacath flushed with 20ml NS and 500U/34ml Heparin and needle removed intact. No bruising or swelling intact  Procedure tolerated well and without incident.     Discharged from clinic ambulatory with cane in stable condition. Alert and oriented x 3. F/U with [facility] as scheduled.

## 2023-09-23 ENCOUNTER — Ambulatory Visit (HOSPITAL_COMMUNITY)
Admission: RE | Admit: 2023-09-23 | Discharge: 2023-09-23 | Disposition: A | Discharge status: Home / Self Care | Payer: 59 | Source: Ambulatory Visit | Attending: Gerontology | Admitting: Gerontology

## 2023-09-23 ENCOUNTER — Other Ambulatory Visit (HOSPITAL_COMMUNITY)
Admission: RE | Admit: 2023-09-23 | Discharge: 2023-09-23 | Disposition: A | Discharge status: Home / Self Care | Payer: 59 | Source: Ambulatory Visit | Attending: Internal Medicine | Admitting: Internal Medicine

## 2023-09-23 ENCOUNTER — Other Ambulatory Visit (HOSPITAL_COMMUNITY): Payer: Self-pay | Admitting: Gerontology

## 2023-09-23 DIAGNOSIS — R053 Chronic cough: Secondary | ICD-10-CM | POA: Insufficient documentation

## 2023-09-23 DIAGNOSIS — G894 Chronic pain syndrome: Secondary | ICD-10-CM | POA: Diagnosis not present

## 2023-09-23 DIAGNOSIS — J40 Bronchitis, not specified as acute or chronic: Secondary | ICD-10-CM | POA: Diagnosis not present

## 2023-09-23 DIAGNOSIS — I10 Essential (primary) hypertension: Secondary | ICD-10-CM | POA: Diagnosis not present

## 2023-09-23 DIAGNOSIS — R059 Cough, unspecified: Secondary | ICD-10-CM | POA: Diagnosis not present

## 2023-09-23 DIAGNOSIS — R0600 Dyspnea, unspecified: Secondary | ICD-10-CM | POA: Diagnosis not present

## 2023-09-23 LAB — BASIC METABOLIC PANEL
Anion gap: 8 (ref 5–15)
BUN: 8 mg/dL (ref 6–20)
CO2: 23 mmol/L (ref 22–32)
Calcium: 9.4 mg/dL (ref 8.9–10.3)
Chloride: 104 mmol/L (ref 98–111)
Creatinine, Ser: 1.05 mg/dL (ref 0.61–1.24)
GFR, Estimated: >60 mL/min (ref >60)
Glucose, Bld: 77 mg/dL (ref 70–99)
Potassium: 4 mmol/L (ref 3.5–5.1)
Sodium: 135 mmol/L (ref 135–145)

## 2023-09-24 DIAGNOSIS — Z79891 Long term (current) use of opiate analgesic: Secondary | ICD-10-CM | POA: Diagnosis not present

## 2023-09-24 DIAGNOSIS — G894 Chronic pain syndrome: Secondary | ICD-10-CM | POA: Diagnosis not present

## 2023-09-24 DIAGNOSIS — M47817 Spondylosis without myelopathy or radiculopathy, lumbosacral region: Secondary | ICD-10-CM | POA: Diagnosis not present

## 2023-10-23 DIAGNOSIS — I10 Essential (primary) hypertension: Secondary | ICD-10-CM | POA: Diagnosis not present

## 2023-10-24 DIAGNOSIS — G894 Chronic pain syndrome: Secondary | ICD-10-CM | POA: Diagnosis not present

## 2023-10-24 DIAGNOSIS — M47817 Spondylosis without myelopathy or radiculopathy, lumbosacral region: Secondary | ICD-10-CM | POA: Diagnosis not present

## 2023-11-21 DIAGNOSIS — G4733 Obstructive sleep apnea (adult) (pediatric): Secondary | ICD-10-CM | POA: Diagnosis not present

## 2023-11-23 DIAGNOSIS — I10 Essential (primary) hypertension: Secondary | ICD-10-CM | POA: Diagnosis not present

## 2023-11-27 DIAGNOSIS — G894 Chronic pain syndrome: Secondary | ICD-10-CM | POA: Diagnosis not present

## 2023-11-27 DIAGNOSIS — M47817 Spondylosis without myelopathy or radiculopathy, lumbosacral region: Secondary | ICD-10-CM | POA: Diagnosis not present

## 2023-12-23 ENCOUNTER — Inpatient Hospital Stay: Payer: 59 | Attending: Hematology

## 2023-12-23 VITALS — BP 96/68 | HR 101 | Temp 99.8°F | Resp 19

## 2023-12-23 DIAGNOSIS — Z85048 Personal history of other malignant neoplasm of rectum, rectosigmoid junction, and anus: Secondary | ICD-10-CM | POA: Diagnosis not present

## 2023-12-23 DIAGNOSIS — Z95828 Presence of other vascular implants and grafts: Secondary | ICD-10-CM

## 2023-12-23 DIAGNOSIS — Z452 Encounter for adjustment and management of vascular access device: Secondary | ICD-10-CM | POA: Diagnosis not present

## 2023-12-23 MED ORDER — SODIUM CHLORIDE 0.9% FLUSH
10.0000 mL | INTRAVENOUS | Status: DC | PRN
  Administered 2023-12-23: 10 mL via INTRAVENOUS

## 2023-12-23 MED ORDER — HEPARIN SOD (PORK) LOCK FLUSH 100 UNIT/ML IV SOLN
500.0000 [IU] | Freq: Once | INTRAVENOUS | Status: AC
  Administered 2023-12-23: 500 [IU] via INTRAVENOUS

## 2023-12-23 NOTE — Progress Notes (Signed)
[name] presented for Portacath access and flush. Proper placement of portacath confirmed by CXR. Portacath located left chest wall accessed with  H 20 needle. Good blood return present. Portacath flushed with 52ml NS and 500U/60ml Heparin and needle removed intact. Procedure without incident. Patient tolerated procedure well.

## 2023-12-24 DIAGNOSIS — I10 Essential (primary) hypertension: Secondary | ICD-10-CM | POA: Diagnosis not present

## 2023-12-25 DIAGNOSIS — M47817 Spondylosis without myelopathy or radiculopathy, lumbosacral region: Secondary | ICD-10-CM | POA: Diagnosis not present

## 2023-12-25 DIAGNOSIS — G894 Chronic pain syndrome: Secondary | ICD-10-CM | POA: Diagnosis not present

## 2024-01-21 DIAGNOSIS — I10 Essential (primary) hypertension: Secondary | ICD-10-CM | POA: Diagnosis not present

## 2024-01-22 DIAGNOSIS — G894 Chronic pain syndrome: Secondary | ICD-10-CM | POA: Diagnosis not present

## 2024-01-22 DIAGNOSIS — M47817 Spondylosis without myelopathy or radiculopathy, lumbosacral region: Secondary | ICD-10-CM | POA: Diagnosis not present

## 2024-02-19 DIAGNOSIS — M47817 Spondylosis without myelopathy or radiculopathy, lumbosacral region: Secondary | ICD-10-CM | POA: Diagnosis not present

## 2024-02-19 DIAGNOSIS — Z79891 Long term (current) use of opiate analgesic: Secondary | ICD-10-CM | POA: Diagnosis not present

## 2024-02-19 DIAGNOSIS — G894 Chronic pain syndrome: Secondary | ICD-10-CM | POA: Diagnosis not present

## 2024-02-21 DIAGNOSIS — I10 Essential (primary) hypertension: Secondary | ICD-10-CM | POA: Diagnosis not present

## 2024-03-12 ENCOUNTER — Inpatient Hospital Stay: Payer: 59 | Attending: Hematology

## 2024-03-12 VITALS — BP 110/75 | HR 93 | Temp 96.7°F | Resp 20

## 2024-03-12 DIAGNOSIS — C20 Malignant neoplasm of rectum: Secondary | ICD-10-CM | POA: Diagnosis not present

## 2024-03-12 DIAGNOSIS — Z452 Encounter for adjustment and management of vascular access device: Secondary | ICD-10-CM | POA: Diagnosis not present

## 2024-03-12 LAB — CBC WITH DIFFERENTIAL/PLATELET
Abs Immature Granulocytes: 0.03 10*3/uL (ref 0.00–0.07)
Basophils Absolute: 0 10*3/uL (ref 0.0–0.1)
Basophils Relative: 1 %
Eosinophils Absolute: 0.3 10*3/uL (ref 0.0–0.5)
Eosinophils Relative: 6 %
HCT: 42.5 % (ref 39.0–52.0)
Hemoglobin: 13.9 g/dL (ref 13.0–17.0)
Immature Granulocytes: 1 %
Lymphocytes Relative: 29 %
Lymphs Abs: 1.3 10*3/uL (ref 0.7–4.0)
MCH: 29.4 pg (ref 26.0–34.0)
MCHC: 32.7 g/dL (ref 30.0–36.0)
MCV: 89.9 fL (ref 80.0–100.0)
Monocytes Absolute: 0.5 10*3/uL (ref 0.1–1.0)
Monocytes Relative: 10 %
Neutro Abs: 2.5 10*3/uL (ref 1.7–7.7)
Neutrophils Relative %: 53 %
Platelets: 166 10*3/uL (ref 150–400)
RBC: 4.73 MIL/uL (ref 4.22–5.81)
RDW: 12.6 % (ref 11.5–15.5)
WBC: 4.6 10*3/uL (ref 4.0–10.5)
nRBC: 0 % (ref 0.0–0.2)

## 2024-03-12 LAB — COMPREHENSIVE METABOLIC PANEL WITH GFR
ALT: 67 U/L — ABNORMAL HIGH (ref 0–44)
AST: 56 U/L — ABNORMAL HIGH (ref 15–41)
Albumin: 3.9 g/dL (ref 3.5–5.0)
Alkaline Phosphatase: 68 U/L (ref 38–126)
Anion gap: 10 (ref 5–15)
BUN: 12 mg/dL (ref 8–23)
CO2: 22 mmol/L (ref 22–32)
Calcium: 9.5 mg/dL (ref 8.9–10.3)
Chloride: 104 mmol/L (ref 98–111)
Creatinine, Ser: 1.39 mg/dL — ABNORMAL HIGH (ref 0.61–1.24)
GFR, Estimated: 58 mL/min — ABNORMAL LOW (ref >60)
Glucose, Bld: 111 mg/dL — ABNORMAL HIGH (ref 70–99)
Potassium: 3.6 mmol/L (ref 3.5–5.1)
Sodium: 136 mmol/L (ref 135–145)
Total Bilirubin: 0.9 mg/dL (ref 0.0–1.2)
Total Protein: 7.6 g/dL (ref 6.5–8.1)

## 2024-03-12 MED ORDER — SODIUM CHLORIDE 0.9% FLUSH
10.0000 mL | Freq: Once | INTRAVENOUS | Status: AC
  Administered 2024-03-12: 10 mL via INTRAVENOUS

## 2024-03-12 MED ORDER — HEPARIN SOD (PORK) LOCK FLUSH 100 UNIT/ML IV SOLN
500.0000 [IU] | Freq: Once | INTRAVENOUS | Status: AC
  Administered 2024-03-12: 500 [IU] via INTRAVENOUS

## 2024-03-12 NOTE — Progress Notes (Signed)
[name] presented for Portacath access and flush. Proper placement of portacath confirmed by CXR. Portacath located left chest wall accessed with  H 20 needle. Good blood return present. Portacath flushed with 52ml NS and 500U/60ml Heparin and needle removed intact. Procedure without incident. Patient tolerated procedure well.

## 2024-03-12 NOTE — Patient Instructions (Signed)
 [facility] - A DEPT OF [place]  Discharge Instructions: Thank you for choosing [facility] to provide your oncology and hematology care.  If you have a lab appointment with the Cancer Center - please note that after April 8th, 2024, all labs will be drawn in the cancer center.  You do not have to check in or register with the main entrance as you have in the past but will complete your check-in in the cancer center.  Wear comfortable clothing and clothing appropriate for easy access to any Portacath or PICC line.   We strive to give you quality time with your provider. You may need to reschedule your appointment if you arrive late (15 or more minutes).  Arriving late affects you and other patients whose appointments are after yours.  Also, if you miss three or more appointments without notifying the office, you may be dismissed from the clinic at the provider's discretion.      For prescription refill requests, have your pharmacy contact our office and allow 72 hours for refills to be completed.    Today you received the following : port a cath flush with labs   To help prevent nausea and vomiting after your treatment, we encourage you to take your nausea medication as directed.  BELOW ARE SYMPTOMS THAT SHOULD BE REPORTED IMMEDIATELY: *FEVER GREATER THAN 100.4 F (38 C) OR HIGHER *CHILLS OR SWEATING *NAUSEA AND VOMITING THAT IS NOT CONTROLLED WITH YOUR NAUSEA MEDICATION *UNUSUAL SHORTNESS OF BREATH *UNUSUAL BRUISING OR BLEEDING *URINARY PROBLEMS (pain or burning when urinating, or frequent urination) *BOWEL PROBLEMS (unusual diarrhea, constipation, pain near the anus) TENDERNESS IN MOUTH AND THROAT WITH OR WITHOUT PRESENCE OF ULCERS (sore throat, sores in mouth, or a toothache) UNUSUAL RASH, SWELLING OR PAIN  UNUSUAL VAGINAL DISCHARGE OR ITCHING   Items with * indicate a potential emergency and should be followed up as soon as possible or  go to the Emergency Department if any problems should occur.  Please show the CHEMOTHERAPY ALERT CARD or IMMUNOTHERAPY ALERT CARD at check-in to the Emergency Department and triage nurse.  Should you have questions after your visit or need to cancel or reschedule your appointment, please contact [facility]  - A DEPT OF [facility] [phone]  and follow the prompts.  Office hours are 8:00 a.m. to 4:30 p.m. Monday - Friday. Please note that voicemails left after 4:00 p.m. may not be returned until the following business day.  We are closed weekends and major holidays. You have access to a nurse at all times for urgent questions. Please call the main number to the clinic [phone] and follow the prompts.  For any non-urgent questions, you may also contact your provider using MyChart. We now offer e-Visits for anyone 41 and older to request care online for non-urgent symptoms. For details visit [email or web address].   Also download the MyChart app! Go to the app store, search "MyChart", open the app, select , and log in with your MyChart username and password.

## 2024-03-13 LAB — CEA: CEA: 4.1 ng/mL (ref 0.0–4.7)

## 2024-03-18 DIAGNOSIS — M47817 Spondylosis without myelopathy or radiculopathy, lumbosacral region: Secondary | ICD-10-CM | POA: Diagnosis not present

## 2024-03-18 DIAGNOSIS — G894 Chronic pain syndrome: Secondary | ICD-10-CM | POA: Diagnosis not present

## 2024-03-19 ENCOUNTER — Inpatient Hospital Stay: Payer: 59 | Attending: Hematology | Admitting: Hematology

## 2024-03-19 ENCOUNTER — Other Ambulatory Visit (HOSPITAL_COMMUNITY)
Admission: RE | Admit: 2024-03-19 | Discharge: 2024-03-19 | Disposition: A | Discharge status: Home / Self Care | Source: Ambulatory Visit | Attending: Internal Medicine | Admitting: Internal Medicine

## 2024-03-19 VITALS — BP 138/83 | HR 63 | Temp 98.0°F | Resp 18 | Ht 62.0 in | Wt 218.5 lb

## 2024-03-19 DIAGNOSIS — E785 Hyperlipidemia, unspecified: Secondary | ICD-10-CM | POA: Diagnosis not present

## 2024-03-19 DIAGNOSIS — C20 Malignant neoplasm of rectum: Secondary | ICD-10-CM | POA: Insufficient documentation

## 2024-03-19 DIAGNOSIS — Z1389 Encounter for screening for other disorder: Secondary | ICD-10-CM | POA: Diagnosis not present

## 2024-03-19 DIAGNOSIS — M549 Dorsalgia, unspecified: Secondary | ICD-10-CM | POA: Diagnosis not present

## 2024-03-19 DIAGNOSIS — R7303 Prediabetes: Secondary | ICD-10-CM | POA: Insufficient documentation

## 2024-03-19 DIAGNOSIS — G894 Chronic pain syndrome: Secondary | ICD-10-CM | POA: Diagnosis not present

## 2024-03-19 DIAGNOSIS — I10 Essential (primary) hypertension: Secondary | ICD-10-CM | POA: Insufficient documentation

## 2024-03-19 DIAGNOSIS — Z0001 Encounter for general adult medical examination with abnormal findings: Secondary | ICD-10-CM | POA: Diagnosis not present

## 2024-03-19 DIAGNOSIS — G4733 Obstructive sleep apnea (adult) (pediatric): Secondary | ICD-10-CM | POA: Diagnosis not present

## 2024-03-19 LAB — HEMOGLOBIN A1C
Hgb A1c MFr Bld: 6.1 % — ABNORMAL HIGH (ref 4.8–5.6)
Mean Plasma Glucose: 128.37 mg/dL

## 2024-03-19 LAB — LIPID PANEL
Cholesterol: 162 mg/dL (ref 0–200)
HDL: 37 mg/dL — ABNORMAL LOW (ref >40)
LDL Cholesterol: 96 mg/dL (ref 0–99)
Total CHOL/HDL Ratio: 4.4 ratio
Triglycerides: 145 mg/dL (ref <150)
VLDL: 29 mg/dL (ref 0–40)

## 2024-03-19 LAB — CBC WITH DIFFERENTIAL/PLATELET
Abs Immature Granulocytes: 0.04 10*3/uL (ref 0.00–0.07)
Basophils Absolute: 0 10*3/uL (ref 0.0–0.1)
Basophils Relative: 1 %
Eosinophils Absolute: 0.3 10*3/uL (ref 0.0–0.5)
Eosinophils Relative: 6 %
HCT: 44.4 % (ref 39.0–52.0)
Hemoglobin: 14.2 g/dL (ref 13.0–17.0)
Immature Granulocytes: 1 %
Lymphocytes Relative: 26 %
Lymphs Abs: 1.1 10*3/uL (ref 0.7–4.0)
MCH: 28.9 pg (ref 26.0–34.0)
MCHC: 32 g/dL (ref 30.0–36.0)
MCV: 90.4 fL (ref 80.0–100.0)
Monocytes Absolute: 0.4 10*3/uL (ref 0.1–1.0)
Monocytes Relative: 10 %
Neutro Abs: 2.3 10*3/uL (ref 1.7–7.7)
Neutrophils Relative %: 56 %
Platelets: 170 10*3/uL (ref 150–400)
RBC: 4.91 MIL/uL (ref 4.22–5.81)
RDW: 12.5 % (ref 11.5–15.5)
WBC: 4.2 10*3/uL (ref 4.0–10.5)
nRBC: 0 % (ref 0.0–0.2)

## 2024-03-19 LAB — BASIC METABOLIC PANEL WITH GFR
Anion gap: 11 (ref 5–15)
BUN: 18 mg/dL (ref 8–23)
CO2: 22 mmol/L (ref 22–32)
Calcium: 9.9 mg/dL (ref 8.9–10.3)
Chloride: 102 mmol/L (ref 98–111)
Creatinine, Ser: 1.19 mg/dL (ref 0.61–1.24)
GFR, Estimated: >60 mL/min (ref >60)
Glucose, Bld: 114 mg/dL — ABNORMAL HIGH (ref 70–99)
Potassium: 4.3 mmol/L (ref 3.5–5.1)
Sodium: 135 mmol/L (ref 135–145)

## 2024-03-19 LAB — HEPATIC FUNCTION PANEL
ALT: 59 U/L — ABNORMAL HIGH (ref 0–44)
AST: 57 U/L — ABNORMAL HIGH (ref 15–41)
Albumin: 4.1 g/dL (ref 3.5–5.0)
Alkaline Phosphatase: 70 U/L (ref 38–126)
Bilirubin, Direct: 0.1 mg/dL (ref 0.0–0.2)
Indirect Bilirubin: 0.7 mg/dL (ref 0.3–0.9)
Total Bilirubin: 0.8 mg/dL (ref 0.0–1.2)
Total Protein: 7.7 g/dL (ref 6.5–8.1)

## 2024-03-19 NOTE — Progress Notes (Signed)
 [name] [address] [place] [address]    Clinic Day:  03/19/2024  Referring physician: Fanta, Tesfaye [name]*  Patient Care Team: Douglas Heidelberg, MD as PCP - General (Internal Medicine) Douglas Cheadle Windsor Hatcher, MD as Consulting Physician (Gastroenterology)   ASSESSMENT & PLAN:   Assessment: 1.  Stage III rectal adenocarcinoma: -Status post segmental resection of the rectosigmoid cancer on 07/06/2015, pathology showing 1/14 lymph nodes positive, margins negative, MMR normal. -6 cycles of adjuvant FOLFOX from 08/15/2015 through 11/06/2015 followed by 5-FU and XRT from 12/05/2015 through 01/16/2016 followed by 2 more cycles of FOLFOX from 01/30/2016 through 02/13/2016. -Last colonoscopy on 07/28/2018 showed diverticulosis of the sigmoid colon and descending colon. -Patient had CT CAP on 04/21/2020 which showed no evidence of metastatic disease.  Did show degree of colonic distention suggests constipation.  Cecal distention now is near 10 cm without perirectal stranding this is increased from prior scans.      Plan: 1.  Stage III rectal adenocarcinoma: - Denies any change in bowel habits.  No bleeding per rectum or melena. - He had chronically elevated LFTs with AST 56 and ALT 67.  This is likely from fatty liver.  Mild CKD is also stable.  CEA was normal at 4.1. - I have recommended that he have follow-up with Dr. Riley Cheadle for colonoscopy. - Recommend follow-up in 1 year with repeat labs. - We discussed about discontinuation of the port.  He wants to keep the port.    No orders of the defined types were placed in this encounter.     [name],acting as a Neurosurgeon for Douglas Boros, MD.,have documented all relevant documentation on the behalf of Douglas Boros, MD,as directed by  Douglas Boros, MD while in the presence of Douglas Boros, MD.  I, Douglas Boros MD, have reviewed the above documentation for accuracy and completeness,  and I agree with the above.    Douglas Boros, MD   5/1/20253:02 PM  CHIEF COMPLAINT:   Diagnosis: rectal cancer    Cancer Staging  Rectal cancer [facility]) Staging form: Colon and Rectum, AJCC 7th Edition - Pathologic stage from 09/12/2015: Stage IIIA (T1, N1a, cM0) - Signed by Douglas Gant, PA-C on 09/25/2015 - Clinical stage from 09/26/2015: Stage IIIA (T1, N1a, M0) - Signed by Douglas Gant, PA-C on 09/25/2015    Prior Therapy: 1. Segmental resection of rectosigmoid on 07/06/2015. 2. FOLFOX x 6 cycles from 08/15/2015 to 11/06/2015. 3. Concurrent chemoradiation with 5-FU from 12/05/2015 to 01/16/2016 and x 2 cycles of FOLFOX consolidation to 02/13/2016  Current Therapy:  surveillance   HISTORY OF PRESENT ILLNESS:   Oncology History  Rectal cancer (HCC)  03/31/2015 Initial Biopsy   Rectum, biopsy, rectal mass - INVASIVE ADENOCARCINOMA   06/02/2015 Tumor Marker   Results for [name] (MRN [id number]) as of 08/28/2015 09:09  06/02/2015 16:00 CEA: 4.3    06/08/2015 Imaging   CT abd/pelvis-Rectal primary, without evidence of metastatic disease or acute complication.   06/24/2015 Imaging   CT chest- No specific features identified to suggest metastatic disease to the chest.   07/06/2015 Definitive Surgery   Robotic-assisted lower anterior resrction, rigid proctoscopy [name])- INVASIVE ADENOCARCINOMA, 2 cm, TUMOR INVADES INTO SUBMUCOSA. LVI/PNI (+).  1/14 LN (+). Margins neg.    07/06/2015 Miscellaneous   IHC Tumor Expression Results normal.  Negative for MLH1, MSH2, MSH6, & PMS2.    07/06/2015 Pathologic Stage   pT1, pN1a, pMx   08/15/2015 - 11/16/2015  Chemotherapy   FOLFOX x 6 cycles (adjuvant chemo "phase 1")   09/26/2015 Treatment Plan Change   Treatment held today.  5FU bolus D/C'd.  Neulasta  OnPro added to each subsequent cycles of therapy.   10/04/2015 Treatment Plan Change   Neulasta  onpro added to all subsequent cycles of treatment.   10/18/2015  Treatment Plan Change   Tx held for thrombocytopenia.  5FU CI is reduced by 20% for subsequent treatments.   12/05/2015 - 01/16/2016 Chemotherapy   5FU continuous infusion weekly x 6 cycles; concurrent radiation. (adjuvant chemo "phase 2")   12/05/2015 - 01/18/2016 Radiation Therapy   Pelvic radiation completed in [name]).  Pelvis 45 Gy in 25 fractions.  Then cone down boost treatment for additional 5.4 Gy in 8 fractions.  Total dose: 50.4 Gy in 33 treatments   01/30/2016 - 02/13/2016 Chemotherapy   FOLFOX x 2 cycles to complete 6 months of adjuvant chemotherapy.  (adjuvant chemo "phase 3")   05/21/2016 Procedure   Colonoscopy by Dr. Riley Cheadle- The procedure was aborted due to inadequate bowel prep. Patient readily admits to eating at least applesauce yesterday which was in conflict with our written and verbal preparation instructions. No specimens collected.   06/19/2016 Imaging   CT abd/pelvis- Postsurgical changes of the rectum. New presacral soft tissue thickening likely post treatment related. Recommend attention on followup. Large amount of stool in the colon compatible with constipation. No evidence for distant metastatic dz.    Imaging   CT abd/pelvis: IMPRESSION: Stable exam. No evidence of recurrent for metastatic carcinoma within the abdomen or pelvis.   Mild decrease in large colonic stool burden since prior study. No acute findings      INTERVAL HISTORY:   [name] is a 63 y.o. male presenting to clinic today for follow up of rectal cancer. He was last seen by me on 03/21/23.  Today, he states that he is doing well overall. His appetite level is at 100%. His energy level is at 40%. His chronic back pain is stable and has regular follow-up with pain management. He reports his pain management doctor will burn off the veins in the area and if this does not improve the pain, he will have surgery.   He did not follow-up with Dr. Riley Cheadle for colonoscopy as he forgot.   [name] has regular  follow-up with Dr. Eldon [name] and had an appointment with him today. He denies any BRBPR, hematuria, or melena.   PAST MEDICAL HISTORY:   Past Medical History: Past Medical History:  Diagnosis Date   Arthritis    Cancer (HCC)    rectal   Depression    Diabetes mellitus    "Borderline"   GERD (gastroesophageal reflux disease)    No weakness   High cholesterol    Hypertension    Neuropathy    "back missed up"   Rectal cancer [facility])    Sleep apnea     Surgical History: Past Surgical History:  Procedure Laterality Date   BACK SURGERY     BIOPSY  03/31/2015   Procedure: BIOPSY;  Surgeon: Suzette Espy, MD;  Location: [facility];  Service: Endoscopy;;   CHOLECYSTECTOMY     COLONOSCOPY WITH PROPOFOL  N/A 03/31/2015   Procedure: COLONOSCOPY WITH PROPOFOL  at cecum 0842; withdrawal time=33minutes;  Surgeon: Suzette Espy, MD;  Location: [facility];  Service: Endoscopy;  Laterality: N/A;   COLONOSCOPY WITH PROPOFOL  N/A 05/21/2016   Procedure: COLONOSCOPY WITH PROPOFOL ;  Surgeon: Suzette Espy, MD;  Location: [facility];  Service: Endoscopy;  Laterality: N/A;  200 - moved to 12:45 - office calling pt with 11:15 arrival time   COLONOSCOPY WITH PROPOFOL  N/A 07/26/2016   Procedure: COLONOSCOPY WITH PROPOFOL ;  Surgeon: Suzette Espy, MD;  Location: [facility];  Service: Endoscopy;  Laterality: N/A;  115   COLONOSCOPY WITH PROPOFOL  N/A 07/28/2018   Surgeon: Suzette Espy, MD; diverticulosis in the sigmoid and descending colon, long redundant colon, otherwise normal exam.  Compromised exam due to inadequate preparation with recommendations to repeat in 1 year.   FLEXIBLE SIGMOIDOSCOPY N/A 07/05/2015   Procedure: FLEXIBLE SIGMOIDOSCOPY;  Surgeon: Joyce Nixon, MD;  Location: [facility];  Service: Endoscopy;  Laterality: N/A;  with tattoo   POLYPECTOMY  03/31/2015   Procedure: POLYPECTOMY;  Surgeon: Suzette Espy, MD;  Location: [facility];  Service: Endoscopy;;   PORTACATH PLACEMENT N/A  08/09/2015   Procedure: INSERTION PORT-A-CATH LEFT SUBCLAVIAN;  Surgeon: Joyce Nixon, MD;  Location: [facility] OR;  Service: General;  Laterality: N/A;   XI ROBOTIC ASSISTED LOWER ANTERIOR RESECTION N/A 07/06/2015   Procedure: XI ROBOTIC ASSISTED LOWER ANTERIOR RESECTION, rigid proctoscopy;  Surgeon: Joyce Nixon, MD;  Location: [facility];  Service: General;  Laterality: N/A;    Social History: Social History   Socioeconomic History   Marital status: Divorced    Spouse name: Not on file   Number of children: Not on file   Years of education: Not on file   Highest education level: Not on file  Occupational History   Not on file  Tobacco Use   Smoking status: Former    Current packs/day: 0.00    Average packs/day: 0.3 packs/day for 2.0 years (0.5 ttl pk-yrs)    Types: Cigarettes    Start date: 03/23/2004    Quit date: 03/23/2006    Years since quitting: 18.0   Smokeless tobacco: Never  Vaping Use   Vaping status: Never Used  Substance and Sexual Activity   Alcohol  use: No    Alcohol /week: 0.0 standard drinks of alcohol    Drug use: No   Sexual activity: Not Currently  Other Topics Concern   Not on file  Social History Narrative   Not on file   Social Drivers of Health   Financial Resource Strain: Low Risk  (09/26/2020)   Overall Financial Resource Strain (CARDIA)    Difficulty of Paying Living Expenses: Not hard at all  Food Insecurity: No Food Insecurity (09/26/2020)   Hunger Vital Sign    Worried About Running Out of Food in the Last Year: Never true    [name] Out of Food in the Last Year: Never true  Transportation Needs: No Transportation Needs (09/26/2020)   PRAPARE - Administrator, Civil Service (Medical): No    Lack of Transportation (Non-Medical): No  Physical Activity: Inactive (09/26/2020)   Exercise Vital Sign    Days of Exercise per Week: 0 days    Minutes of Exercise per Session: 0 min  Stress: No Stress Concern Present (09/26/2020)   [employer] of  Occupational Health - Occupational Stress Questionnaire    Feeling of Stress : Not at all  Social Connections: Socially Isolated (09/26/2020)   Social Connection and Isolation Panel [NHANES]    Frequency of Communication with Friends and Family: More than three times a week    Frequency of Social Gatherings with Friends and Family: More than three times a week    Attends Religious Services: Never    Active Member of  Clubs or Organizations: No    Attends Banker Meetings: Never    Marital Status: Divorced  Catering manager Violence: Not At Risk (09/26/2020)   Humiliation, Afraid, Rape, and Kick questionnaire    Fear of Current or Ex-Partner: No    Emotionally Abused: No    Physically Abused: No    Sexually Abused: No    Family History: Family History  Problem Relation Age of Onset   Hypertension Other        "Not sure who, I don't know much about my family"   Diabetes Other        "Not sure who, I don't know much about my family"   Colon cancer Neg Hx        "Not that I know of"   Colon polyps Neg Hx        "not that I know of"    Current Medications:  Current Outpatient Medications:    ACCU-CHEK FASTCLIX LANCETS MISC, , Disp: , Rfl:    ACCU-CHEK SMARTVIEW test strip, , Disp: , Rfl:    Alcohol  Swabs (B-D SINGLE USE SWABS REGULAR) PADS, , Disp: , Rfl:    amLODipine  (NORVASC ) 10 MG tablet, amlodipine  10 mg tablet  TAKE 1 TABLET BY MOUTH ONCE DAILY, Disp: , Rfl:    BELBUCA 600 MCG FILM, SMARTSIG:1 Strip(s) By Mouth Every 12 Hours, Disp: , Rfl:    Blood Glucose Monitoring Suppl (ACCU-CHEK GUIDE) w/Device KIT, daily. use as directed, Disp: , Rfl:    diclofenac Sodium (VOLTAREN) 1 % GEL, SMARTSIG:4 Gram(s) Topical 3 Times Daily PRN, Disp: , Rfl:    gabapentin  (NEURONTIN ) 800 MG tablet, Take 1 tablet (800 mg total) by mouth 3 (three) times daily. (Patient taking differently: Take 800 mg by mouth 2 (two) times daily.), Disp: 90 tablet, Rfl: 2   HYDROcodone -acetaminophen   (NORCO) 10-325 MG tablet, Take 1 tablet by mouth 5 (five) times daily as needed., Disp: , Rfl:    lactulose  (CHRONULAC ) 10 GM/15ML solution, Constulose  10 gram/15 mL oral solution  TAKE 30 ML BY MOUTH EVERY 3 HOURS UNTIL YOU HAVE A BOWEL MOVEMENT THEN START TAKING 30 MLS AT BEDTIME, Disp: , Rfl:    lisinopril  (PRINIVIL ,ZESTRIL ) 40 MG tablet, Take 1 tablet (40 mg total) by mouth every morning., Disp: 30 tablet, Rfl: 2   naloxone (NARCAN) nasal spray 4 mg/0.1 mL, SMARTSIG:Spray(s) In Nostril, Disp: , Rfl:    NON FORMULARY, PT HAS A C-PAP MACHINE, Disp: , Rfl:    nortriptyline  (PAMELOR ) 75 MG capsule, Take 1 capsule (75 mg total) by mouth at bedtime., Disp: 30 capsule, Rfl: 2   oxyCODONE -acetaminophen  (PERCOCET) 10-325 MG tablet, Take 1 tablet by mouth 5 (five) times daily., Disp: , Rfl:    tiZANidine  (ZANAFLEX ) 2 MG tablet, Take 1 tablet (2 mg total) by mouth every 8 (eight) hours as needed for muscle spasms. (Patient taking differently: Take 2 mg by mouth 2 (two) times daily.), Disp: 30 tablet, Rfl: 2   Allergies: No Known Allergies  REVIEW OF SYSTEMS:   Review of Systems  Constitutional:  Negative for chills, fatigue and fever.  HENT:   Negative for lump/mass, mouth sores, nosebleeds, sore throat and trouble swallowing.   Eyes:  Negative for eye problems.  Respiratory:  Positive for shortness of breath (occasional). Negative for cough.   Cardiovascular:  Negative for chest pain, leg swelling and palpitations.  Gastrointestinal:  Negative for abdominal pain, constipation, diarrhea, nausea and vomiting.  Genitourinary:  Negative for bladder incontinence, difficulty  urinating, dysuria, frequency, hematuria and nocturia.   Musculoskeletal:  Positive for back pain (7/10 severity). Negative for arthralgias, flank pain, myalgias and neck pain.  Skin:  Negative for itching and rash.  Neurological:  Negative for dizziness, headaches and numbness.  Hematological:  Does not bruise/bleed easily.   Psychiatric/Behavioral:  Positive for depression. Negative for sleep disturbance and suicidal ideas. The patient is not nervous/anxious.   All other systems reviewed and are negative.    VITALS:   Blood pressure 138/83, pulse 63, temperature 98 F (36.7 C), temperature source Oral, resp. rate 18, height 5\' 2"  (1.575 m), weight 218 lb 7.6 oz (99.1 kg), SpO2 98%.  Wt Readings from Last 3 Encounters:  03/19/24 218 lb 7.6 oz (99.1 kg)  06/05/23 214 lb (97.1 kg)  03/21/23 216 lb 8 oz (98.2 kg)    Body mass index is 39.96 kg/m.  Performance status (ECOG): 1 - Symptomatic but completely ambulatory  PHYSICAL EXAM:   Physical Exam Vitals and nursing note reviewed. Exam conducted with a chaperone present.  Constitutional:      Appearance: Normal appearance.  Cardiovascular:     Rate and Rhythm: Normal rate and regular rhythm.     Pulses: Normal pulses.     Heart sounds: Normal heart sounds.  Pulmonary:     Effort: Pulmonary effort is normal.     Breath sounds: Normal breath sounds.  Abdominal:     Palpations: Abdomen is soft. There is no hepatomegaly, splenomegaly or mass.     Tenderness: There is no abdominal tenderness.  Musculoskeletal:     Right lower leg: No edema.     Left lower leg: No edema.  Lymphadenopathy:     Cervical: No cervical adenopathy.     Right cervical: No superficial, deep or posterior cervical adenopathy.    Left cervical: No superficial, deep or posterior cervical adenopathy.     Upper Body:     Right upper body: No supraclavicular or axillary adenopathy.     Left upper body: No supraclavicular or axillary adenopathy.  Neurological:     General: No focal deficit present.     Mental Status: He is alert and oriented to person, place, and time.  Psychiatric:        Mood and Affect: Mood normal.        Behavior: Behavior normal.     LABS:      Latest Ref Rng & Units 03/19/2024   11:04 AM 03/12/2024   11:46 AM 03/21/2023    3:16 PM  CBC  WBC 4.0 -  10.5 K/uL 4.2  4.6  5.7   Hemoglobin 13.0 - 17.0 g/dL 86.5  78.4  69.6   Hematocrit 39.0 - 52.0 % 44.4  42.5  43.2   Platelets 150 - 400 K/uL 170  166  175       Latest Ref Rng & Units 03/19/2024   11:04 AM 03/12/2024   11:46 AM 09/23/2023   11:44 AM  CMP  Glucose 70 - 99 mg/dL 295  284  77   BUN 8 - 23 mg/dL 18  12  8    Creatinine 0.61 - 1.24 mg/dL 1.32  4.40  1.02   Sodium 135 - 145 mmol/L 135  136  135   Potassium 3.5 - 5.1 mmol/L 4.3  3.6  4.0   Chloride 98 - 111 mmol/L 102  104  104   CO2 22 - 32 mmol/L 22  22  23    Calcium  8.9 - 10.3  mg/dL 9.9  9.5  9.4   Total Protein 6.5 - 8.1 g/dL 7.7  7.6    Total Bilirubin 0.0 - 1.2 mg/dL 0.8  0.9    Alkaline Phos 38 - 126 U/L 70  68    AST 15 - 41 U/L 57  56    ALT 0 - 44 U/L 59  67       Lab Results  Component Value Date   CEA1 4.1 03/12/2024   CEA 4.4 12/13/2016   /  CEA  Date Value Ref Range Status  03/12/2024 4.1 0.0 - 4.7 ng/mL Final    Comment:    (NOTE)                             Nonsmokers          <3.9                             Smokers             <5.6 Roche [employer]ostics Electrochemiluminescence Immunoassay (ECLIA) Values obtained with different assay methods or kits cannot be used interchangeably.  Results cannot be interpreted as absolute evidence of the presence or absence of malignant disease. Performed At: [facility] [address] [place], [place] [id number] Pearlean Botts MD [id number]   12/13/2016 4.4 0.0 - 4.7 ng/mL Final    Comment:    (NOTE)       Roche ECLIA methodology       Nonsmokers  <3.9                                     Smokers     <5.6 Performed At: [name] [address] [place], [place] [id number] Juel Nutley MD [id number]:[id number]    No results found for: "PSA1" No results found for: "CAN199" No results found for: "CAN125"  No results found for: "TOTALPROTELP", "ALBUMINELP", "A1GS", "A2GS", "BETS", "BETA2SER", "GAMS", "MSPIKE", "SPEI" Lab Results   Component Value Date   FERRITIN 122 10/22/2018   Lab Results  Component Value Date   LDH 167 03/06/2022   LDH 140 02/28/2021   LDH 151 08/29/2020     STUDIES:   No results found.

## 2024-03-19 NOTE — Patient Instructions (Addendum)
 [facility] at [facility] Discharge Instructions   You were seen and examined today by Dr. Cheree Cords.  He reviewed the results of your lab work which are normal/stable.   We will refer you to Dr. Riley Cheadle for a colonoscopy.   We will see you back in one year. We will repeat lab work prior to this visit.   Return as scheduled.    Thank you for choosing [facility] at [facility] to provide your oncology and hematology care.  To afford each patient quality time with our provider, please arrive at least 15 minutes before your scheduled appointment time.   If you have a lab appointment with the Cancer Center please come in thru the Main Entrance and check in at the main information desk.  You need to re-schedule your appointment should you arrive 10 or more minutes late.  We strive to give you quality time with our providers, and arriving late affects you and other patients whose appointments are after yours.  Also, if you no show three or more times for appointments you may be dismissed from the clinic at the providers discretion.     Again, thank you for choosing [facility].  Our hope is that these requests will decrease the amount of time that you wait before being seen by our physicians.       _____________________________________________________________  Should you have questions after your visit to [facility], please contact our office at [phone] and follow the prompts.  Our office hours are 8:00 a.m. and 4:30 p.m. Monday - Friday.  Please note that voicemails left after 4:00 p.m. may not be returned until the following business day.  We are closed weekends and major holidays.  You do have access to a nurse 24-7, just call the main number to the clinic [phone] and do not press any options, hold on the line and a nurse will answer the phone.    For prescription refill requests, have your pharmacy contact our  office and allow 72 hours.    Due to Covid, you will need to wear a mask upon entering the hospital. If you do not have a mask, a mask will be given to you at the Main Entrance upon arrival. For doctor visits, patients may have 1 support person age 58 or older with them. For treatment visits, patients can not have anyone with them due to social distancing guidelines and our immunocompromised population.

## 2024-03-25 ENCOUNTER — Encounter: Payer: Self-pay | Admitting: *Deleted

## 2024-04-15 DIAGNOSIS — M47817 Spondylosis without myelopathy or radiculopathy, lumbosacral region: Secondary | ICD-10-CM | POA: Diagnosis not present

## 2024-04-15 DIAGNOSIS — G894 Chronic pain syndrome: Secondary | ICD-10-CM | POA: Diagnosis not present

## 2024-04-19 DIAGNOSIS — I10 Essential (primary) hypertension: Secondary | ICD-10-CM | POA: Diagnosis not present

## 2024-04-21 DIAGNOSIS — I10 Essential (primary) hypertension: Secondary | ICD-10-CM | POA: Diagnosis not present

## 2024-05-14 DIAGNOSIS — M47817 Spondylosis without myelopathy or radiculopathy, lumbosacral region: Secondary | ICD-10-CM | POA: Diagnosis not present

## 2024-05-14 DIAGNOSIS — G894 Chronic pain syndrome: Secondary | ICD-10-CM | POA: Diagnosis not present

## 2024-05-21 DIAGNOSIS — I10 Essential (primary) hypertension: Secondary | ICD-10-CM | POA: Diagnosis not present

## 2024-06-11 DIAGNOSIS — G894 Chronic pain syndrome: Secondary | ICD-10-CM | POA: Diagnosis not present

## 2024-06-11 DIAGNOSIS — M47817 Spondylosis without myelopathy or radiculopathy, lumbosacral region: Secondary | ICD-10-CM | POA: Diagnosis not present

## 2024-06-18 ENCOUNTER — Inpatient Hospital Stay: Attending: Hematology

## 2024-06-18 DIAGNOSIS — Z452 Encounter for adjustment and management of vascular access device: Secondary | ICD-10-CM | POA: Insufficient documentation

## 2024-06-18 DIAGNOSIS — Z85048 Personal history of other malignant neoplasm of rectum, rectosigmoid junction, and anus: Secondary | ICD-10-CM | POA: Diagnosis not present

## 2024-06-18 MED ORDER — SODIUM CHLORIDE 0.9% FLUSH
10.0000 mL | Freq: Once | INTRAVENOUS | Status: AC
  Administered 2024-06-18: 10 mL

## 2024-06-18 MED ORDER — HEPARIN SOD (PORK) LOCK FLUSH 100 UNIT/ML IV SOLN
500.0000 [IU] | Freq: Once | INTRAVENOUS | Status: AC
  Administered 2024-06-18: 500 [IU] via INTRAVENOUS

## 2024-06-18 NOTE — Progress Notes (Signed)
 [place] DELENA Maffucci presented for Portacath access and flush.  Portacath located left chest wall accessed with  H 20 needle.  Good blood return present. Portacath flushed with 20ml NS and 500U/66ml Heparin  and needle removed intact.  Procedure tolerated well and without incident. Discharged from clinic ambulatory in stable condition. Alert and oriented x 3. F/U with [facility] as scheduled.

## 2024-06-21 DIAGNOSIS — I10 Essential (primary) hypertension: Secondary | ICD-10-CM | POA: Diagnosis not present

## 2024-06-25 ENCOUNTER — Other Ambulatory Visit (HOSPITAL_COMMUNITY)
Admission: RE | Admit: 2024-06-25 | Discharge: 2024-06-25 | Disposition: A | Discharge status: Home / Self Care | Source: Ambulatory Visit | Attending: Internal Medicine | Admitting: Internal Medicine

## 2024-06-25 DIAGNOSIS — R799 Abnormal finding of blood chemistry, unspecified: Secondary | ICD-10-CM | POA: Insufficient documentation

## 2024-06-25 LAB — HEPATITIS B SURFACE ANTIGEN: Hepatitis B Surface Ag: NONREACTIVE

## 2024-06-25 LAB — HEPATITIS B SURFACE ANTIBODY,QUALITATIVE: Hep B S Ab: NONREACTIVE

## 2024-06-26 LAB — HCV AB W REFLEX TO QUANT PCR: HCV Ab: NONREACTIVE

## 2024-06-26 LAB — HCV INTERPRETATION

## 2024-07-16 DIAGNOSIS — G894 Chronic pain syndrome: Secondary | ICD-10-CM | POA: Diagnosis not present

## 2024-07-16 DIAGNOSIS — M47817 Spondylosis without myelopathy or radiculopathy, lumbosacral region: Secondary | ICD-10-CM | POA: Diagnosis not present

## 2024-07-22 DIAGNOSIS — I10 Essential (primary) hypertension: Secondary | ICD-10-CM | POA: Diagnosis not present

## 2024-08-11 DIAGNOSIS — M47817 Spondylosis without myelopathy or radiculopathy, lumbosacral region: Secondary | ICD-10-CM | POA: Diagnosis not present

## 2024-08-11 DIAGNOSIS — Z79891 Long term (current) use of opiate analgesic: Secondary | ICD-10-CM | POA: Diagnosis not present

## 2024-08-11 DIAGNOSIS — G894 Chronic pain syndrome: Secondary | ICD-10-CM | POA: Diagnosis not present

## 2024-08-21 DIAGNOSIS — I10 Essential (primary) hypertension: Secondary | ICD-10-CM | POA: Diagnosis not present

## 2024-09-08 DIAGNOSIS — G894 Chronic pain syndrome: Secondary | ICD-10-CM | POA: Diagnosis not present

## 2024-09-08 DIAGNOSIS — M47817 Spondylosis without myelopathy or radiculopathy, lumbosacral region: Secondary | ICD-10-CM | POA: Diagnosis not present

## 2024-09-17 ENCOUNTER — Inpatient Hospital Stay: Attending: Hematology

## 2024-09-17 DIAGNOSIS — Z452 Encounter for adjustment and management of vascular access device: Secondary | ICD-10-CM | POA: Insufficient documentation

## 2024-09-17 DIAGNOSIS — Z85048 Personal history of other malignant neoplasm of rectum, rectosigmoid junction, and anus: Secondary | ICD-10-CM | POA: Diagnosis present

## 2024-09-17 NOTE — Patient Instructions (Signed)
 [facility] [place] - A DEPT OF [facility]  Discharge Instructions: Thank you for choosing [facility] to provide your oncology and hematology care.  If you have a lab appointment with the Cancer Center - please note that after April 8th, 2024, all labs will be drawn in the cancer center.  You do not have to check in or register with the main entrance as you have in the past but will complete your check-in in the cancer center.  Wear comfortable clothing and clothing appropriate for easy access to any Portacath or PICC line.   We strive to give you quality time with your provider. You may need to reschedule your appointment if you arrive late (15 or more minutes).  Arriving late affects you and other patients whose appointments are after yours.  Also, if you miss three or more appointments without notifying the office, you may be dismissed from the clinic at the provider's discretion.      For prescription refill requests, have your pharmacy contact our office and allow 72 hours for refills to be completed.    Today you received the following chemotherapy and/or immunotherapy agents port flush.        To help prevent nausea and vomiting after your treatment, we encourage you to take your nausea medication as directed.  BELOW ARE SYMPTOMS THAT SHOULD BE REPORTED IMMEDIATELY: *FEVER GREATER THAN 100.4 F (38 C) OR HIGHER *CHILLS OR SWEATING *NAUSEA AND VOMITING THAT IS NOT CONTROLLED WITH YOUR NAUSEA MEDICATION *UNUSUAL SHORTNESS OF BREATH *UNUSUAL BRUISING OR BLEEDING *URINARY PROBLEMS (pain or burning when urinating, or frequent urination) *BOWEL PROBLEMS (unusual diarrhea, constipation, pain near the anus) TENDERNESS IN MOUTH AND THROAT WITH OR WITHOUT PRESENCE OF ULCERS (sore throat, sores in mouth, or a toothache) UNUSUAL RASH, SWELLING OR PAIN  UNUSUAL VAGINAL DISCHARGE OR ITCHING   Items with * indicate a potential emergency and should be  followed up as soon as possible or go to the Emergency Department if any problems should occur.  Please show the CHEMOTHERAPY ALERT CARD or IMMUNOTHERAPY ALERT CARD at check-in to the Emergency Department and triage nurse.  Should you have questions after your visit or need to cancel or reschedule your appointment, please contact [facility] [place] - A DEPT OF [facility] [phone]  and follow the prompts.  Office hours are 8:00 a.m. to 4:30 p.m. Monday - Friday. Please note that voicemails left after 4:00 p.m. may not be returned until the following business day.  We are closed weekends and major holidays. You have access to a nurse at all times for urgent questions. Please call the main number to the clinic [phone] and follow the prompts.  For any non-urgent questions, you may also contact your provider using MyChart. We now offer e-Visits for anyone 45 and older to request care online for non-urgent symptoms. For details visit [email or web address].   Also download the MyChart app! Go to the app store, search MyChart, open the app, select [facility], and log in with your MyChart username and password.

## 2024-09-17 NOTE — Progress Notes (Signed)
 Patients port flushed without difficulty.  Good blood return noted with no bruising or swelling noted at site.  Band aid applied.  VSS with discharge and left in satisfactory condition with no s/s of distress noted.

## 2024-09-21 DIAGNOSIS — G894 Chronic pain syndrome: Secondary | ICD-10-CM | POA: Diagnosis not present

## 2024-09-21 DIAGNOSIS — G4733 Obstructive sleep apnea (adult) (pediatric): Secondary | ICD-10-CM | POA: Diagnosis not present

## 2024-09-21 DIAGNOSIS — R7303 Prediabetes: Secondary | ICD-10-CM | POA: Diagnosis not present

## 2024-09-21 DIAGNOSIS — I10 Essential (primary) hypertension: Secondary | ICD-10-CM | POA: Diagnosis not present

## 2024-12-17 ENCOUNTER — Inpatient Hospital Stay

## 2024-12-23 ENCOUNTER — Inpatient Hospital Stay: Attending: Hematology

## 2024-12-23 NOTE — Progress Notes (Signed)
 Port flushed with good blood return noted. No bruising or swelling at site. Bandaid applied and patient discharged in satisfactory condition. VVS stable with no signs or symptoms of distressed noted.

## 2024-12-23 NOTE — Patient Instructions (Signed)
 [facility] [place] [facility] - A DEPT OF [facility]  Discharge Instructions: Thank you for choosing [facility] to provide your oncology and hematology care.  If you have a lab appointment with the Cancer Center - please note that after April 8th, 2024, all labs will be drawn in the cancer center.  You do not have to check in or register with the main entrance as you have in the past but will complete your check-in in the cancer center.  Wear comfortable clothing and clothing appropriate for easy access to any Portacath or PICC line.   We strive to give you quality time with your provider. You may need to reschedule your appointment if you arrive late (15 or more minutes).  Arriving late affects you and other patients whose appointments are after yours.  Also, if you miss three or more appointments without notifying the office, you may be dismissed from the clinic at the provider's discretion.      For prescription refill requests, have your pharmacy contact our office and allow 72 hours for refills to be completed.    Today you received the following port flush return as scheduled.   To help prevent nausea and vomiting after your treatment, we encourage you to take your nausea medication as directed.  BELOW ARE SYMPTOMS THAT SHOULD BE REPORTED IMMEDIATELY: *FEVER GREATER THAN 100.4 F (38 C) OR HIGHER *CHILLS OR SWEATING *NAUSEA AND VOMITING THAT IS NOT CONTROLLED WITH YOUR NAUSEA MEDICATION *UNUSUAL SHORTNESS OF BREATH *UNUSUAL BRUISING OR BLEEDING *URINARY PROBLEMS (pain or burning when urinating, or frequent urination) *BOWEL PROBLEMS (unusual diarrhea, constipation, pain near the anus) TENDERNESS IN MOUTH AND THROAT WITH OR WITHOUT PRESENCE OF ULCERS (sore throat, sores in mouth, or a toothache) UNUSUAL RASH, SWELLING OR PAIN  UNUSUAL VAGINAL DISCHARGE OR ITCHING   Items with * indicate a potential emergency and should be followed up as soon as possible  or go to the Emergency Department if any problems should occur.  Please show the CHEMOTHERAPY ALERT CARD or IMMUNOTHERAPY ALERT CARD at check-in to the Emergency Department and triage nurse.  Should you have questions after your visit or need to cancel or reschedule your appointment, please contact [facility]  - A DEPT OF [facility] [phone]  and follow the prompts.  Office hours are 8:00 a.m. to 4:30 p.m. Monday - Friday. Please note that voicemails left after 4:00 p.m. may not be returned until the following business day.  We are closed weekends and major holidays. You have access to a nurse at all times for urgent questions. Please call the main number to the clinic [phone] and follow the prompts.  For any non-urgent questions, you may also contact your provider using MyChart. We now offer e-Visits for anyone 16 and older to request care online for non-urgent symptoms. For details visit [email or web address].   Also download the MyChart app! Go to the app store, search MyChart, open the app, select [facility], and log in with your MyChart username and password.

## 2025-03-10 ENCOUNTER — Other Ambulatory Visit

## 2025-03-17 ENCOUNTER — Ambulatory Visit: Admitting: Physician Assistant
